# Patient Record
Sex: Female | Born: 1950
Health system: Southern US, Community
[De-identification: ages and names within clinical notes are randomized; demographics above are authoritative.]

## PROBLEM LIST (undated history)

## (undated) DIAGNOSIS — I776 Arteritis, unspecified: Secondary | ICD-10-CM

## (undated) DIAGNOSIS — L659 Nonscarring hair loss, unspecified: Secondary | ICD-10-CM

## (undated) DIAGNOSIS — F32A Depression, unspecified: Secondary | ICD-10-CM

## (undated) DIAGNOSIS — R0600 Dyspnea, unspecified: Secondary | ICD-10-CM

## (undated) DIAGNOSIS — E049 Nontoxic goiter, unspecified: Secondary | ICD-10-CM

## (undated) DIAGNOSIS — F419 Anxiety disorder, unspecified: Secondary | ICD-10-CM

## (undated) DIAGNOSIS — R011 Cardiac murmur, unspecified: Secondary | ICD-10-CM

## (undated) DIAGNOSIS — M797 Fibromyalgia: Secondary | ICD-10-CM

## (undated) DIAGNOSIS — Z780 Asymptomatic menopausal state: Secondary | ICD-10-CM

## (undated) DIAGNOSIS — G4730 Sleep apnea, unspecified: Secondary | ICD-10-CM

## (undated) DIAGNOSIS — M47817 Spondylosis without myelopathy or radiculopathy, lumbosacral region: Secondary | ICD-10-CM

## (undated) DIAGNOSIS — I471 Supraventricular tachycardia, unspecified: Secondary | ICD-10-CM

## (undated) DIAGNOSIS — F329 Major depressive disorder, single episode, unspecified: Secondary | ICD-10-CM

## (undated) DIAGNOSIS — R7611 Nonspecific reaction to tuberculin skin test without active tuberculosis: Secondary | ICD-10-CM

## (undated) DIAGNOSIS — K219 Gastro-esophageal reflux disease without esophagitis: Secondary | ICD-10-CM

## (undated) DIAGNOSIS — M1990 Unspecified osteoarthritis, unspecified site: Secondary | ICD-10-CM

## (undated) DIAGNOSIS — I10 Essential (primary) hypertension: Secondary | ICD-10-CM

## (undated) DIAGNOSIS — I351 Nonrheumatic aortic (valve) insufficiency: Secondary | ICD-10-CM

## (undated) DIAGNOSIS — I341 Nonrheumatic mitral (valve) prolapse: Secondary | ICD-10-CM

## (undated) DIAGNOSIS — I340 Nonrheumatic mitral (valve) insufficiency: Secondary | ICD-10-CM

## (undated) DIAGNOSIS — E785 Hyperlipidemia, unspecified: Secondary | ICD-10-CM

## (undated) HISTORY — DX: Nonscarring hair loss, unspecified: L65.9

## (undated) HISTORY — DX: Supraventricular tachycardia, unspecified: I47.10

## (undated) HISTORY — PX: EYE SURGERY: SHX253

## (undated) HISTORY — PX: CHOLECYSTECTOMY: SHX55

## (undated) HISTORY — PX: DIAGNOSTIC LAPAROSCOPY: SUR761

## (undated) HISTORY — PX: APPENDECTOMY: SHX54

## (undated) HISTORY — DX: Gastro-esophageal reflux disease without esophagitis: K21.9

## (undated) HISTORY — DX: Nontoxic goiter, unspecified: E04.9

## (undated) HISTORY — DX: Nonspecific reaction to tuberculin skin test without active tuberculosis: R76.11

## (undated) HISTORY — DX: Depression, unspecified: F32.A

## (undated) HISTORY — DX: Supraventricular tachycardia: I47.1

## (undated) HISTORY — PX: PARTIAL HYSTERECTOMY: SHX80

## (undated) HISTORY — PX: CARDIAC CATHETERIZATION: SHX172

## (undated) HISTORY — DX: Essential (primary) hypertension: I10

## (undated) HISTORY — DX: Arteritis, unspecified: I77.6

## (undated) HISTORY — DX: Asymptomatic menopausal state: Z78.0

## (undated) HISTORY — DX: Major depressive disorder, single episode, unspecified: F32.9

## (undated) HISTORY — PX: ABDOMINAL HYSTERECTOMY: SHX81

## (undated) HISTORY — DX: Hyperlipidemia, unspecified: E78.5

## (undated) HISTORY — DX: Fibromyalgia: M79.7

## (undated) HISTORY — DX: Anxiety disorder, unspecified: F41.9

---

## 1999-11-27 ENCOUNTER — Ambulatory Visit (HOSPITAL_COMMUNITY): Admission: RE | Admit: 1999-11-27 | Discharge: 1999-11-27 | Payer: Self-pay | Admitting: Emergency Medicine

## 2000-06-29 LAB — HM MAMMOGRAPHY

## 2001-12-04 ENCOUNTER — Inpatient Hospital Stay (HOSPITAL_COMMUNITY): Admission: EM | Admit: 2001-12-04 | Discharge: 2001-12-06 | Payer: Self-pay | Admitting: Emergency Medicine

## 2001-12-05 ENCOUNTER — Encounter: Payer: Self-pay | Admitting: Family Medicine

## 2001-12-06 ENCOUNTER — Encounter: Payer: Self-pay | Admitting: Family Medicine

## 2001-12-14 ENCOUNTER — Encounter: Admission: RE | Admit: 2001-12-14 | Discharge: 2001-12-14 | Payer: Self-pay | Admitting: Family Medicine

## 2001-12-18 ENCOUNTER — Encounter: Admission: RE | Admit: 2001-12-18 | Discharge: 2002-03-18 | Payer: Self-pay | Admitting: Family Medicine

## 2002-04-30 ENCOUNTER — Other Ambulatory Visit: Admission: RE | Admit: 2002-04-30 | Discharge: 2002-04-30 | Payer: Self-pay | Admitting: Diagnostic Radiology

## 2003-01-01 ENCOUNTER — Encounter: Admission: RE | Admit: 2003-01-01 | Discharge: 2003-04-01 | Payer: Self-pay | Admitting: Obstetrics and Gynecology

## 2004-07-27 ENCOUNTER — Ambulatory Visit: Payer: Self-pay | Admitting: Endocrinology

## 2004-09-01 ENCOUNTER — Ambulatory Visit: Payer: Self-pay | Admitting: Endocrinology

## 2004-10-01 ENCOUNTER — Ambulatory Visit: Payer: Self-pay | Admitting: Endocrinology

## 2004-10-29 ENCOUNTER — Emergency Department (HOSPITAL_COMMUNITY): Admission: EM | Admit: 2004-10-29 | Discharge: 2004-10-30 | Payer: Self-pay | Admitting: Emergency Medicine

## 2004-11-12 ENCOUNTER — Ambulatory Visit: Payer: Self-pay | Admitting: Endocrinology

## 2005-03-11 ENCOUNTER — Ambulatory Visit: Payer: Self-pay | Admitting: Endocrinology

## 2005-03-25 ENCOUNTER — Ambulatory Visit: Payer: Self-pay | Admitting: Endocrinology

## 2005-07-01 ENCOUNTER — Ambulatory Visit: Payer: Self-pay | Admitting: Endocrinology

## 2005-07-12 ENCOUNTER — Ambulatory Visit: Payer: Self-pay | Admitting: Endocrinology

## 2005-07-20 ENCOUNTER — Inpatient Hospital Stay (HOSPITAL_COMMUNITY): Admission: RE | Admit: 2005-07-20 | Discharge: 2005-07-23 | Payer: Self-pay | Admitting: Obstetrics and Gynecology

## 2005-07-20 ENCOUNTER — Encounter (INDEPENDENT_AMBULATORY_CARE_PROVIDER_SITE_OTHER): Payer: Self-pay | Admitting: *Deleted

## 2005-07-23 ENCOUNTER — Ambulatory Visit: Payer: Self-pay | Admitting: Internal Medicine

## 2005-08-03 ENCOUNTER — Ambulatory Visit: Payer: Self-pay | Admitting: Endocrinology

## 2005-12-13 ENCOUNTER — Emergency Department (HOSPITAL_COMMUNITY): Admission: EM | Admit: 2005-12-13 | Discharge: 2005-12-13 | Payer: Self-pay | Admitting: Emergency Medicine

## 2005-12-29 ENCOUNTER — Ambulatory Visit (HOSPITAL_COMMUNITY): Admission: RE | Admit: 2005-12-29 | Discharge: 2005-12-29 | Payer: Self-pay | Admitting: Gastroenterology

## 2006-01-05 ENCOUNTER — Ambulatory Visit: Payer: Self-pay | Admitting: Endocrinology

## 2006-02-04 ENCOUNTER — Ambulatory Visit: Payer: Self-pay | Admitting: Endocrinology

## 2006-07-01 ENCOUNTER — Ambulatory Visit: Payer: Self-pay | Admitting: Endocrinology

## 2006-07-15 ENCOUNTER — Ambulatory Visit: Payer: Self-pay | Admitting: Endocrinology

## 2006-08-05 ENCOUNTER — Ambulatory Visit: Payer: Self-pay | Admitting: Endocrinology

## 2006-10-21 ENCOUNTER — Ambulatory Visit: Payer: Self-pay | Admitting: Endocrinology

## 2006-10-21 LAB — CONVERTED CEMR LAB: Vitamin B-12: 626 pg/mL (ref 211–911)

## 2007-01-24 ENCOUNTER — Ambulatory Visit: Payer: Self-pay | Admitting: Endocrinology

## 2007-01-24 LAB — CONVERTED CEMR LAB
Microalb Creat Ratio: 24.4 mg/g (ref 0.0–30.0)
Microalb, Ur: 5 mg/dL — ABNORMAL HIGH (ref 0.0–1.9)

## 2007-04-06 ENCOUNTER — Encounter: Payer: Self-pay | Admitting: Endocrinology

## 2007-04-06 DIAGNOSIS — F329 Major depressive disorder, single episode, unspecified: Secondary | ICD-10-CM

## 2007-04-06 DIAGNOSIS — E109 Type 1 diabetes mellitus without complications: Secondary | ICD-10-CM | POA: Insufficient documentation

## 2007-04-06 DIAGNOSIS — E039 Hypothyroidism, unspecified: Secondary | ICD-10-CM

## 2007-04-06 DIAGNOSIS — F411 Generalized anxiety disorder: Secondary | ICD-10-CM | POA: Insufficient documentation

## 2007-05-02 ENCOUNTER — Ambulatory Visit: Payer: Self-pay | Admitting: Endocrinology

## 2007-05-30 ENCOUNTER — Ambulatory Visit: Payer: Self-pay | Admitting: Internal Medicine

## 2007-06-14 LAB — HM COLONOSCOPY

## 2007-07-12 ENCOUNTER — Ambulatory Visit: Payer: Self-pay | Admitting: Internal Medicine

## 2007-07-12 DIAGNOSIS — K5730 Diverticulosis of large intestine without perforation or abscess without bleeding: Secondary | ICD-10-CM | POA: Insufficient documentation

## 2007-08-01 ENCOUNTER — Ambulatory Visit: Payer: Self-pay | Admitting: Endocrinology

## 2007-08-01 ENCOUNTER — Encounter (INDEPENDENT_AMBULATORY_CARE_PROVIDER_SITE_OTHER): Payer: Self-pay | Admitting: *Deleted

## 2007-08-01 LAB — CONVERTED CEMR LAB: Hgb A1c MFr Bld: 6.8 % — ABNORMAL HIGH (ref 4.6–6.0)

## 2007-08-09 ENCOUNTER — Ambulatory Visit: Payer: Self-pay

## 2007-08-09 ENCOUNTER — Encounter: Payer: Self-pay | Admitting: Endocrinology

## 2007-09-28 ENCOUNTER — Emergency Department (HOSPITAL_COMMUNITY): Admission: EM | Admit: 2007-09-28 | Discharge: 2007-09-28 | Payer: Self-pay | Admitting: Emergency Medicine

## 2007-10-17 ENCOUNTER — Encounter: Payer: Self-pay | Admitting: Endocrinology

## 2007-12-04 DIAGNOSIS — Z8679 Personal history of other diseases of the circulatory system: Secondary | ICD-10-CM

## 2007-12-04 DIAGNOSIS — E1149 Type 2 diabetes mellitus with other diabetic neurological complication: Secondary | ICD-10-CM

## 2007-12-04 DIAGNOSIS — J45909 Unspecified asthma, uncomplicated: Secondary | ICD-10-CM | POA: Insufficient documentation

## 2007-12-04 DIAGNOSIS — E785 Hyperlipidemia, unspecified: Secondary | ICD-10-CM | POA: Insufficient documentation

## 2007-12-13 ENCOUNTER — Ambulatory Visit: Payer: Self-pay | Admitting: Endocrinology

## 2007-12-13 LAB — CONVERTED CEMR LAB: Hgb A1c MFr Bld: 6.3 % — ABNORMAL HIGH (ref 4.6–6.0)

## 2007-12-25 ENCOUNTER — Encounter: Payer: Self-pay | Admitting: Internal Medicine

## 2008-04-16 ENCOUNTER — Encounter: Payer: Self-pay | Admitting: Endocrinology

## 2008-04-19 ENCOUNTER — Ambulatory Visit: Payer: Self-pay | Admitting: Endocrinology

## 2008-05-31 ENCOUNTER — Inpatient Hospital Stay (HOSPITAL_COMMUNITY): Admission: EM | Admit: 2008-05-31 | Discharge: 2008-06-05 | Payer: Self-pay | Admitting: Emergency Medicine

## 2008-05-31 ENCOUNTER — Ambulatory Visit: Payer: Self-pay | Admitting: Internal Medicine

## 2008-07-08 ENCOUNTER — Telehealth (INDEPENDENT_AMBULATORY_CARE_PROVIDER_SITE_OTHER): Payer: Self-pay | Admitting: *Deleted

## 2008-07-25 ENCOUNTER — Encounter: Payer: Self-pay | Admitting: Endocrinology

## 2008-07-30 ENCOUNTER — Ambulatory Visit: Payer: Self-pay | Admitting: Endocrinology

## 2008-10-07 ENCOUNTER — Ambulatory Visit: Payer: Self-pay | Admitting: Endocrinology

## 2008-12-31 ENCOUNTER — Encounter: Payer: Self-pay | Admitting: Endocrinology

## 2009-01-09 ENCOUNTER — Ambulatory Visit: Payer: Self-pay | Admitting: Endocrinology

## 2009-04-15 ENCOUNTER — Encounter: Payer: Self-pay | Admitting: Endocrinology

## 2009-04-16 ENCOUNTER — Ambulatory Visit: Payer: Self-pay | Admitting: Endocrinology

## 2009-06-17 ENCOUNTER — Telehealth: Payer: Self-pay | Admitting: Endocrinology

## 2009-07-14 ENCOUNTER — Ambulatory Visit: Payer: Self-pay | Admitting: Endocrinology

## 2009-07-14 DIAGNOSIS — R809 Proteinuria, unspecified: Secondary | ICD-10-CM | POA: Insufficient documentation

## 2009-07-14 DIAGNOSIS — IMO0001 Reserved for inherently not codable concepts without codable children: Secondary | ICD-10-CM

## 2009-10-13 ENCOUNTER — Inpatient Hospital Stay (HOSPITAL_COMMUNITY): Admission: EM | Admit: 2009-10-13 | Discharge: 2009-10-16 | Payer: Self-pay | Admitting: Emergency Medicine

## 2009-10-13 ENCOUNTER — Ambulatory Visit: Payer: Self-pay | Admitting: Family Medicine

## 2009-11-25 ENCOUNTER — Telehealth (INDEPENDENT_AMBULATORY_CARE_PROVIDER_SITE_OTHER): Payer: Self-pay | Admitting: *Deleted

## 2010-05-04 ENCOUNTER — Encounter: Payer: Self-pay | Admitting: Endocrinology

## 2010-10-13 NOTE — Medication Information (Signed)
Summary: [employer]   Imported By: Lester Edison 05/07/2010 09:41:26  _____________________________________________________________________  External Attachment:    Type:   Image     Comment:   External Document

## 2010-10-13 NOTE — Progress Notes (Signed)
Summary: Records request from [employer]  Request for records received from [employer]. Request forwarded to [facility]. Dena Chavis  November 25, 2009 11:44 AM

## 2010-11-30 LAB — CBC
MCHC: 32.8 g/dL (ref 30.0–36.0)
RBC: 5.16 MIL/uL — ABNORMAL HIGH (ref 3.87–5.11)
WBC: 13.4 10*3/uL — ABNORMAL HIGH (ref 4.0–10.5)

## 2010-11-30 LAB — BASIC METABOLIC PANEL
CO2: 28 mEq/L (ref 19–32)
Creatinine, Ser: 0.76 mg/dL (ref 0.4–1.2)
GFR calc Af Amer: >60 mL/min (ref >60)

## 2010-11-30 LAB — DIFFERENTIAL
Eosinophils Relative: 0 % (ref 0–5)
Monocytes Absolute: 0.5 10*3/uL (ref 0.1–1.0)
Monocytes Relative: 4 % (ref 3–12)
Neutro Abs: 10.9 10*3/uL — ABNORMAL HIGH (ref 1.7–7.7)

## 2010-11-30 LAB — GLUCOSE, CAPILLARY
Glucose-Capillary: 125 mg/dL — ABNORMAL HIGH (ref 70–99)
Glucose-Capillary: 165 mg/dL — ABNORMAL HIGH (ref 70–99)
Glucose-Capillary: 166 mg/dL — ABNORMAL HIGH (ref 70–99)
Glucose-Capillary: 68 mg/dL — ABNORMAL LOW (ref 70–99)

## 2010-12-04 LAB — COMPREHENSIVE METABOLIC PANEL
ALT: 13 U/L (ref 0–35)
ALT: 15 U/L (ref 0–35)
AST: 19 U/L (ref 0–37)
Albumin: 2.9 g/dL — ABNORMAL LOW (ref 3.5–5.2)
Alkaline Phosphatase: 48 U/L (ref 39–117)
BUN: 2 mg/dL — ABNORMAL LOW (ref 6–23)
CO2: 25 mEq/L (ref 19–32)
CO2: 27 mEq/L (ref 19–32)
Calcium: 7.5 mg/dL — ABNORMAL LOW (ref 8.4–10.5)
Calcium: 7.6 mg/dL — ABNORMAL LOW (ref 8.4–10.5)
Creatinine, Ser: 0.68 mg/dL (ref 0.4–1.2)
GFR calc Af Amer: >60 mL/min (ref >60)
GFR calc non Af Amer: >60 mL/min (ref >60)
GFR calc non Af Amer: >60 mL/min (ref >60)
Glucose, Bld: 150 mg/dL — ABNORMAL HIGH (ref 70–99)
Sodium: 139 mEq/L (ref 135–145)
Sodium: 140 mEq/L (ref 135–145)
Total Protein: 5.6 g/dL — ABNORMAL LOW (ref 6.0–8.3)

## 2010-12-04 LAB — GLUCOSE, CAPILLARY
Glucose-Capillary: 121 mg/dL — ABNORMAL HIGH (ref 70–99)
Glucose-Capillary: 130 mg/dL — ABNORMAL HIGH (ref 70–99)
Glucose-Capillary: 131 mg/dL — ABNORMAL HIGH (ref 70–99)
Glucose-Capillary: 131 mg/dL — ABNORMAL HIGH (ref 70–99)
Glucose-Capillary: 143 mg/dL — ABNORMAL HIGH (ref 70–99)
Glucose-Capillary: 148 mg/dL — ABNORMAL HIGH (ref 70–99)
Glucose-Capillary: 155 mg/dL — ABNORMAL HIGH (ref 70–99)
Glucose-Capillary: 159 mg/dL — ABNORMAL HIGH (ref 70–99)
Glucose-Capillary: 165 mg/dL — ABNORMAL HIGH (ref 70–99)

## 2010-12-04 LAB — CBC
MCHC: 33.5 g/dL (ref 30.0–36.0)
MCV: 87.1 fL (ref 78.0–100.0)
Platelets: 212 10*3/uL (ref 150–400)
RBC: 4.18 MIL/uL (ref 3.87–5.11)
RDW: 13.6 % (ref 11.5–15.5)

## 2011-01-26 NOTE — Discharge Summary (Signed)
[name], [name] NO.:  [id number]   MEDICAL RECORD NO.:  [id number]          PATIENT TYPE:  INP   LOCATION:  1302                         FACILITY:  [facility]   PHYSICIAN:  Velora Heckler, MD      DATE OF BIRTH:  December 30, 1950   DATE OF ADMISSION:  05/30/2008  DATE OF DISCHARGE:  06/05/2008                               DISCHARGE SUMMARY   REASON FOR ADMISSION:  Abdominal pain, nausea, vomiting, leukocytosis.   HISTORY OF PRESENT ILLNESS:  Patient is a 60 year old white female  referred by Dr. Earl Lites from [facility] to general surgery  for small bowel obstruction.  The patient had abdominal pain with  distention.  She was seen by Dr. Cleta Alberts and referred to [facility]  Emergency Department.  CT scan of the abdomen and pelvis was performed.  This showed dilated small bowel loops.  There was an area of transition  in the distal ileum.  The patient also was noted to have an elevated  white blood cell count of 19,000.   HOSPITAL COURSE:  Patient was admitted on the general surgical service  for distal small bowel obstruction.  The patient was also seen in  consultation by Dr. Rene Paci from [facility].  Patient  was made n.p.o. and hydrated.  The patient showed gradual but steady  improvement.  White blood cell count normalized.  The patient had been  placed on Cipro for urinary tract infection.  Abdominal x-rays were  obtained at intervals and continued to slowly improve.  The patient made  gradual progress.  Clinical signs of small bowel obstruction resolved.  Radiographic picture improved until the day of discharge when her  abdominal x-ray was essentially normal on review by Dr. Irish Lack  and radiology.   DISCHARGE PLAN:  Patient will be discharged home today, September 23, in  good condition, tolerating her regular diabetic diet, ambulating  independently.  Patient will be seen back in my office at [facility] as  needed.  She will follow up with her primary  physician, Dr. Earl Lites, in the near future.   FINAL DIAGNOSES:  1. Partial small bowel obstruction, resolved.  2. Insulin-dependent diabetes.  3. Urinary tract infection.   CONDITION ON DISCHARGE:  Good.      Velora Heckler, MD  Electronically Signed     TMG/MEDQ  D:  06/05/2008  T:  06/05/2008  Job:  [id number]   cc:   Vikki Ports A. Felicity Coyer, MD  [address] [place], [place] [address]   Stan Head. Cleta Alberts, M.D.  Fax: [phone]

## 2011-01-26 NOTE — H&P (Signed)
NAMEMarland Kitchen  SHAMELA, HAYDON NO.:  [id number]   MEDICAL RECORD NO.:  [id number]          PATIENT TYPE:  EMS   LOCATION:  ED                           FACILITY:  [facility]   PHYSICIAN:  Velora Heckler, MD      DATE OF BIRTH:  1951/04/28   DATE OF ADMISSION:  05/30/2008  DATE OF DISCHARGE:                              HISTORY & PHYSICAL   REASON FOR ADMISSION:  Abdominal pain, nausea and vomiting,  leukocytosis.   HISTORY OF PRESENT ILLNESS:  The patient is a 60 year old white female  from [place], [place] who presents to Dr. Cleta Alberts at [facility] with a 3-day history of crampy abdominal pain, nausea,  vomiting and low grade fever.  The patient had a normal bowel movement  today.  However, she notes left upper quadrant abdominal pain and  distention.  The patient was evaluated.  She was noted to have an  elevated white blood cell count.  She had abdominal x-rays at [facility] showed dilated small-bowel loops.  The patient was brought  to [facility] emergency department for CT scan abdomen and pelvis and  surgical assessment.   PAST MEDICAL HISTORY:  Status post laparoscopic cholecystectomy, status  post total abdominal hysterectomy, status post appendectomy, history of  diabetes mellitus, history of hypertension, history of  hypercholesterolemia, history of supraventricular tachycardia, history  of hypothyroidism, history of asthma.   MEDICATIONS:  Crestor, Effexor. Hyzaar. Potassium chloride, Synthroid,  albuterol.  Alprazolam.  Amlodipine, aspirin.  Humalog insulin. Ocuvite.  Zolpidem.   ALLERGIES:  PENICILLIN.   SOCIAL HISTORY:  The patient is married with four children.  She is  accompanied by her husband.  She works at nail bar.  She quit smoking in  the early 1980s.  She does not drink alcohol.   FAMILY HISTORY:  Noncontributory.   REVIEW OF SYSTEMS  15 system review without significant findings except as noted above.   PHYSICAL  EXAMINATION:  VITAL SIGNS:  Temperature 98.5, pulse 101,  respirations 20, blood pressure 136/70.  HEENT:  Shows her to be normocephalic, atraumatic.  Sclerae clear.  Conjunctiva clear.  Pupils 3 mm and reactive.  Dentition good.  Mucous  membranes moist.  Voice normal.  Palpation of the neck shows no  nodularity.  No tenderness.  No mass.  There are no supraclavicular  masses.  LUNGS:  Clear to auscultation bilaterally.  CARDIAC:  Exam shows regular rate and rhythm without murmur.  Peripheral  pulses are full.  EXTREMITIES:  Nontender without edema.  ABDOMEN:  Protuberant.  Well-healed surgical wounds.  No sign of hernia.  Bowel sounds were present.  Tenderness in the left upper quadrant and  the left mid abdomen to palpation.  No rebound tenderness.  No palpable  mass.  No hepatosplenomegaly.  NEUROLOGICAL:  The patient is alert and  oriented without focal deficit.  No sign of tremor.   LABORATORY STUDIES:  White count 19.8, hemoglobin 15.5, platelet count  277,000.  Differential shows 84% segmented neutrophils.  Prothrombin  time normal at 13.7, INR normal at 1.0.  Electrolytes are  normal.  Liver  function tests are normal.  Lipase normal at 18.  Report from outside  abdominal x-ray showing dilated small-bowel loops, no free air.   IMPRESSION:  Abdominal pain with leukocytosis of undetermined etiology.   PLAN:  The patient is currently in the emergency department at [facility].  She is scheduled to undergo CT scan abdomen  and pelvis.  She is being evaluated concurrently by Dr. Dione Booze from  the emergency department staff.  Pending CT scan abdomen and pelvis  results we will review the patient's exam again and make a decision  regarding admission to the medical service versus the need for surgical  intervention and management.      Velora Heckler, MD  Electronically Signed     TMG/MEDQ  D:  05/30/2008  T:  06/02/2008  Job:  [id number]   cc:   Brett Canales A.  Cleta Alberts, M.D.  Fax: [phone]   Sean A. Everardo All, MD  [address]  [place]  [place] [address]   Wilhemina Bonito. Marina Goodell, MD  [address]  [place]  [place] [address]

## 2011-01-26 NOTE — Consult Note (Signed)
NAMEAMELIYA, NICOTRA NO.:  [id number]   MEDICAL RECORD NO.:  [id number]          PATIENT TYPE:  INP   LOCATION:  1302                         FACILITY:  [facility]   PHYSICIAN:  Valerie A. Felicity Coyer, MDDATE OF BIRTH:  April 26, 1951   DATE OF CONSULTATION:  05/31/2008  DATE OF DISCHARGE:                                 CONSULTATION   REFERRING PHYSICIAN:  Velora Heckler, MD.   PRIMARY CARE PHYSICIAN:  Stan Head. Cleta Alberts, M.D.   ENDOCRINOLOGIST:  Cleophas Dunker. Everardo All, MD.   GI PHYSICIAN:  Wilhemina Bonito. Marina Goodell, MD.   We have been asked by Velora Heckler, MD of general surgery to assist in  evaluation and treatment of numerous chronic medical issues, as well as  acute management while NPO specifically, regarding insulin pump.  There  is also question of assumption of primary service for this patient  during hospitalization.   The patient is a 60 year old white female with multiple medical problems  who was admitted early today with a small bowel obstruction.  She came  to the [facility] Emergency Room due to 4 days of left lower quadrant  pain associated with intractable nausea and vomiting, intolerant of all  p.o.  No diarrhea symptoms in the last 4 days or prior.  No previous  history of abdominal pain or nausea, vomiting symptoms.  No history of  previous bowel obstruction.  She has had previous abdominal surgeries  including an ovarian mass resection (pathology benign with bilateral  salpingo-oophorectomy in November of 2006, as well as a cholecystectomy.  She reports her last bowel movement was yesterday, September 17 a.m.  which was normal.  She has been evaluated previously for Crohn in the  past by GI due to family history of same without evidence for the  patient having this disease.  She has never had DK ever and reports her  CBGs have been well controlled on an insulin pump for the last several  years since being followed by Dr. Everardo All.  She has had a low grade  fever in  the last several days, but denies any dysuria, hematuria, no  hematemesis.  No recent travel.   PAST MEDICAL HISTORY:  1. Type 1 diabetes on insulin pump.  Parameters are as follows:      a.     2.5 units basal 24 hours daily.      b.     Bolus of 1 unit per 1.8 carbs.      c.     Correction of 1 unit per 15/100 on CBG.  2. Diabetic peripheral neuropathy.  3. History of SVT.  4. History of asthma.  5. Dyslipidemia.  6. Hypothyroidism.  7. Depression, anxiety history, no recent flares.  8. History of diverticulosis.  9. Bilateral salpingo-oophorectomy secondary to benign left ovarian      mass in November of 2006.  10.Status post cholecystectomy.   MEDICATIONS:  Include:  1. Insulin pump, see above for details.  2. Effexor XR 150 mg daily.  3. Accolade 20 mg p.o. b.i.d.  4. Aspirin 325 mg daily.  5. Norvasc 5  mg daily.  6. Hyzaar 10/25 p.o. daily.  7. Potassium 20 mEq daily.  8. Synthroid 125 mcg daily.  9. Crestor 20 mg nightly.  10.Ambien 10 mg nightly.  11.Albuterol MDI p.r.n.  12.Xanax 1 mg tablets one-half to one p.r.n. no regular or recent use.   ALLERGIES:  INCLUDE PENICILLIN.   FAMILY HISTORY:  Mother died at age 78 due to hepatitis complications.  Father died at age 67 due to alcoholic complications following her  mother's death.  She also has a brother who passed away in his late 85s  due to Crohn disease and ultimately cancer.   SOCIAL HISTORY:  She lives at home with her spouse and 29 year old.  She  has no tobacco and no alcohol.   REVIEW OF SYSTEMS:  Positive for low grade fever.  No travel, no chills  or night sweats.  GI:  Positive for left lower quadrant pain times 4  days.  No diarrhea.  Positive nausea, vomiting see above.  RESPIRATORY:  No shortness of breath.  No cough.  CARDIOVASCULAR:  No chest pain.  No  palpitations.  No lower extremity swelling.  PSYCH:  No depression  symptoms.  NEURO:  No headache, no seizure or dizziness.  ENDOCRINE:  No   hypoglycemic episodes or symptoms.  See HPI for other details, otherwise  negative except as listed.   PHYSICAL EXAMINATION:  Temperature 99.3, blood pressure 133/73, pulse of  67, respirations 27, sating 97% room air.  GENERAL:  She is in no acute distress.  Her spouse is present at  bedside.  HEAD:  Normocephalic, atraumatic.  ENT:  Normal hearing.  Oropharynx clear.  Mucous membranes moist.  EYES:  Show PERRL.  EOMI.  No jaundice or scleral icterus.  NECK:  Supple.  No JVD, LAD, thyroid nodules or enlargement.  RESPIRATORY:  She is clear to auscultation bilaterally without wheeze or  crackles.  Good air movement bilaterally with no increased work of  breathing or use of accessory muscles.  CARDIOVASCULAR:  Regular rate and rhythm.  No lower extremity edema  bilaterally.  ABDOMEN:  Protuberant with no appreciable bowel sounds present.  There  is no palpable mass or hepatosplenomegaly.  NEURO:  She moves all extremities spontaneously.  Cranial of II-XII are  symmetrically intact and gait is observed and grossly normal.  PSYCH:  She is awake, alert, oriented x4 with normal mood and affect.   LABORATORY DATA:  White count of 19.8, hemoglobin 15.5, platelets 277.  Coags normal.  Lipase normal.  Complete metabolic panel within normal  limits, noting a glucose of 157.  Urinalysis is positive for 7-10 WBCs  and a urine culture is pending.  CT abdomen and pelvis done early this  morning, she has distal small bowel obstruction with the transition at  the ileum, question likely adhesions versus possible ileitis   ASSESSMENT/PLAN:  1. Distal ileum small bowel obstruction with left lower quadrant pain,      nausea, vomiting times 4 days.  There has been no preceding      symptoms to suggest ileitis.  So we will hold any      gastrointestinal evaluation or input at this time.  Continue      symptom management for pain, nausea, vomiting as ordered by surgery      for the bowel obstruction and  continue with  bowel rest.  Question      role for NG tube or decompression.  We will defer this to surgery.  Follow up KUB daily has been ordered, as well as continuation of IV      fluid, NPO status and recheck CBC in a.m. as I expect her reactive      leukocytosis is due to same.  2. Type 1 diabetes on home insulin pump.  We will continue home      regimen per patient parameters as listed above.  Question need for      decreasing her basal rate while NPO, but at this time the patient      to follow CBGs as prior to admission using her own pump and we will      adjust as needed.  Please see orders that produce parameters.  3. Urinary tract infection.  The patient with a positive UA.  We will      follow-up urine culture.  Start empiric Cipro at this time pending      urine culture, especially with low grade fever and leukocytosis,      follow CBC as above.  4. Hypothyroidism.  We will give IV Synthroid at approximately half      home dose while NPO with plans to resume home dose p.o. Synthroid      when #1 resolved.  5. History of asthma.  No current symptoms.  6. Hypertension.  Hold her medications while NPO.  Blood pressure      reasonably controlled at this time.  7. Dyslipidemia.  Likewise we will hold on statin while NPO.   We appreciate this call and will be happy to follow and advise as needed  for her medical issues, but as her primary MD is not with [facility],  we will not assume primary service at this time, but please call if  questions or problems.  Please see orders for further details.      Valerie A. Felicity Coyer, MD  Electronically Signed     VAL/MEDQ  D:  05/31/2008  T:  06/03/2008  Job:  [id number]

## 2011-01-26 NOTE — Assessment & Plan Note (Signed)
[facility]                         GASTROENTEROLOGY OFFICE NOTE   Ashley, [name]                    MRN:          [id number]  DATE:05/30/2007                            DOB:          Nov 11, 1950    REASON FOR EVALUATION:  Screening colonoscopy.   HISTORY:  This is a 60 year old female with insulin dependent diabetes  mellitus, hypothyroidism, hypertension, and gastroesophageal reflux  disease.  She presents today regarding screening colonoscopy.  The  patient initially underwent screening May 09, 2002.  Examination was  normal.  Followup in 5 years recommended.  Her GI review of systems is  remarkable for chronic heartburn, indigestion, and nausea, for which she  takes Protonix with good results.  She also has a history of pill  impaction, for which she underwent upper endoscopy with Dr. Bosie Clos on  an emergent basis December 29, 2005.  Examination revealed an upper  esophageal stricture would only accommodate pediatric endoscope.  The  stricture was then dilated sequentially to a maximal diameter of 15 mm.  She has had no dysphagia since.  Currently, no lower GI complaints, such  as change in bowel habits or bleeding.   ALLERGIES:  PENICILLIN.   CURRENT MEDICATIONS:  1. Aspirin 325 mg daily.  2. Levothyroxine 125 mcg daily.  3. Effexor XR 150 mg daily.  4. Pantoprazole 40 mg daily.  5. Hyzaar 10/25 mg daily.  6. Zyrtec 10 mg daily.  7. Amlodipine 5 mg daily.  8. Insulin pump.  9. Humalog.  10.Crestor 20 mg daily.  11.Ocuvite.  12.Multivitamin.  13.Calcium with vitamin D and vitamin K.  14.She also uses alprazolam p.r.n.  15.Albuterol p.r.n.  16.Ambien p.r.n.   PAST MEDICAL HISTORY:  As above.   PAST SURGICAL HISTORY:  1. Cholecystectomy.  2. Hysterectomy.  3. Appendectomy.   FAMILY HISTORY:  Brother with Crohn's disease.  Mother with liver  disease.  Father with diabetes, heart disease, and alcoholism.   SOCIAL HISTORY:   The patient is married with 2 natural sons and 2  adopted daughters.  She has an associate's degree.  Does not smoke.  Does not use alcohol.   REVIEW OF SYSTEMS:  Per diagnostic evaluation form.   PHYSICAL EXAM:  Pleasant, well-appearing female in no acute distress.  Blood pressure 120/76, heart rate 68, weight 155 pounds.  She is 5 feet  3 inches in height.  HEENT:  Sclerae anicteric, conjunctivae pink.  Oral mucosa is intact.  No adenopathy.  LUNGS:  Clear.  HEART:  Regular.  ABDOMEN:  Soft without tenderness, mass, or hernia.  Good bowel sounds  heard.   IMPRESSION:  1. Screening colonoscopy.  The patient is an appropriate candidate      without contraindication.  We discussed the results of her last      exam and the recommendation to follow up in 5 years versus the      current recommendation in 10 years.  After a detailed discussion of      the pros and cons of proceeding at this time, she wishes to proceed      with repeat screening at  this time.  I concur.  2. Insulin-requiring diabetes.  The patient will adjust her basal      insulin rate prior to the procedure.  3. Gastroesophageal reflux disease.  4. Proximal esophageal stricture with pill impaction status post prior      esophageal dilation, currently asymptomatic.   RECOMMENDATIONS:  1. Continue Protonix.  2. Colonoscopy with polypectomy if necessary.  The nature of the      procedure as well as the risks, benefits, and alternatives have      been reviewed.  She understood and agreed to proceed.     Wilhemina Bonito. Marina Goodell, MD  Electronically Signed    JNP/MedQ  DD: 05/30/2007  DT: 05/30/2007  Job #: [id number]   cc:   Brett Canales A. Cleta Alberts, M.D.

## 2011-01-29 NOTE — Discharge Summary (Signed)
NAMEDULCY, SIDA NO.:  [id number]   MEDICAL RECORD NO.:  [id number]          PATIENT TYPE:  INP   LOCATION:  1610                         FACILITY:  [facility]   PHYSICIAN:  Daniel L. Gottsegen, M.D.DATE OF BIRTH:  1951-05-15   DATE OF ADMISSION:  07/20/2005  DATE OF DISCHARGE:  07/23/2005                                 DISCHARGE SUMMARY   Patient is a 60 year old female who had presented to Dr. Stan Head. Daub for  her annual physical examination and on exam, there was a large pelvic mass.  Ultrasound confirmed a 10 cm pelvic mass with diffuse low level internal  echoes.  A CA-125 was normal.  Follow-up ultrasound failed to show  resolution of the mass, so patient entered the hospital for exploratory  laparotomy and removal of the mass.  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  Frozen section was benign.  The patient underwent bilateral salpingo-oophorectomy.  Final pathology  report confirmed a benign mucinous cyst adenoma.  Postoperatively, the  patient did well.  By the third postoperative day, she was ready for  discharge.   DISCHARGE MEDICATIONS:  Tylox for pain relief.  She was kept on all of her  admitting medications.   Her diabetes was well controlled during the admission, first with a  Glucomander and then with an insulin pump.   DISCHARGE ACTIVITY:  Regular.   DISCHARGE CONDITION:  Improved.   Final pathology report revealed mucinous cyst adenoma, 11 cm, left ovary.  Otherwise benign findings of both ovaries.   DISCHARGE DIAGNOSIS:  Mucinous cyst adenoma.   OPERATION:  Exploratory laparotomy with bilateral salpingo-oophorectomy.      Daniel L. Eda Paschal, M.D.  Electronically Signed     DLG/MEDQ  D:  08/10/2005  T:  08/10/2005  Job:  [id number]

## 2011-01-29 NOTE — Consult Note (Signed)
[facility]                          ENDOCRINOLOGY CONSULTATION   MEENAKSHI, SAZAMA                    MRN:          [id number]  DATE:10/21/2006                            DOB:          [birth year]    REASON FOR VISIT:  Followup diabetes.   HISTORY OF PRESENT ILLNESS:  A 60 year old woman who states her  glucose's are much improved, except they are often above 200 in the  morning. Her total daily insulin dosage is approximately 140 units. She  states her diet is not good.   Regarding her hypothyroidism, she now takes Synthroid 125 mcg a day.   She also recently had some decreased sensation on her right 4th toe.   PAST MEDICAL HISTORY:  1. Dyslipidemia.  2. Anxiety.  3. Asthma.  4. Menopause.  5. Hypothyroidism.  6. Supraventricular tachycardia.  7. Nonspecific vasculitis.  8. Diabetes nephropathy.  9. Left sided thyroid nodule, for which he has had a benign biopsy.   REVIEW OF SYSTEMS:  Only one episode of hypoglycemia since her last  visit, her glucose was 57 at 10:00 a.m. She has gained a few pounds.   PHYSICAL EXAMINATION:  VITAL SIGNS:  Blood pressure 148/85, heart rate  91, temperature 98.2. Weight 147.  GENERAL:  No distress.  EXTREMITIES:  Feet, normal color and temperature. Sensation is intact to  touch at today's visit. Dorsalis pedis pulses are intact bilaterally and  there is no ulcer.   IMPRESSION:  1. Improved control of diabetes but she needs further adjustments with      her pump.  2. Hypothyroidism.  3. Slight numbness of the right foot.   PLAN:  1. Check B12 and TSH.  2. Increase basal rate to 2.8 units per hour, 24 hours a day.  3. Continue bolus at 2 units per gram of carbohydrate.  4. Continue correction bolus at 1 unit per 15 mg percent, in excess of      100.  5. Check some 3:00 a.m. glucose's to exclude nocturnal hypoglycemia.  6. Return in 3 months.     Sean A. Everardo All, MD  Electronically  Signed    SAE/MedQ  DD: 10/23/2006  DT: 10/23/2006  Job #: [id number]   cc:   Brett Canales A. Cleta Alberts, M.D.

## 2011-01-29 NOTE — Discharge Summary (Signed)
[place]. [facility]  Patient:    [name], [name] Visit Number: [id number] MRN: [id number]          Service Type: MED Location: [phone] Attending Physician:  McDiarmid, Leighton Roach. Dictated by:   Vear Clock, M.D. Admit Date:  12/04/2001 Discharge Date: 12/06/2001   CC:         Dr. ________, [facility]   Discharge Summary  DISCHARGE DIAGNOSES: 1. Asthma exacerbation. 2. Diabetes mellitus, type 2. 3. Hypertension. 4. History of paroxysmal supraventricular tachycardia. 5. Depression/anxiety. 6. Hypothyroidism.  DISCHARGE MEDICATIONS:  1. Tussionex 5 cc p.o. b.i.d. p.r.n. cough.  2. Increase Lantus to subcu q.h.s. while on dexamethasone and then return to     14 units q.h.s.  3. Dexamethasone 9 mg p.o. q.d. x2 days and then per Dr. _______.  4. Avapro 75 mg p.o. q.d.  5. Albuterol MDI with spacer q.4-6h. p.r.n. wheeze.  6. Advair 5/500 b.i.d.  7. Glipizide 5 mg p.o. b.i.d.  8. Lanoxin 250 mg mcg p.o. q.d.  9. Avandia 8 mg p.o. q.d. 10. Accolate 20 mg p.o. b.i.d. 11. Synthroid 150 mcg p.o. q.d. 12. Zoloft 50 mg p.o. q.d. 13. Hydrochlorothiazide 12.5 mg p.o. q.d. 14. Claritin 10 mg p.o. q.d. 15. Alprazolam 0.5 mg p.o. q.6h. p.r.n. anxiety. 16. Aspirin 325 mg p.o. q.d. 17. Buspar 15 mg p.o. b.i.d. 18. Premarin 0.625 mg p.o. q.d.  FOLLOW-UP: The patient is to return to Dr. _______ at [facility] in one to two days for a recheck.  Consideration of steroid dosing should be evaluated at that time.  PROCEDURES AND DIAGNOSTIC STUDIES:  EKG x1 normal sinus rhythm, no SVT.  Chest x-ray on December 05, 2001, with mild atelectasis or infiltrate in the anterior left lower lobe; December 06, 2001, no acute disease.  CONSULTANTS:  None.  ADMISSION HISTORY AND PHYSICAL:  Please see dictated H&P for further details. In short, this is a 60 year old white female with five-day history of cough and wheeze seen at [facility]  on December 01, 2001, given a prednisone Dosepak and started on Lantus. Seen for a recheck with continued dyspnea, peak flow of 180s and progressive worsening with dyspnea at rest and difficulty catching her breath.  The patient admitted for acute asthma exacerbation.  HOSPITAL COURSE:  #1 - ACUTE ASTHMA EXACERBATION:  The patient continued with expiratory wheezing but did improve and to no longer have an oxygen requirement.  She was started on Tussionex for her cough, continued on dexamethasone p.o. while she was here. Spacer was provided to patient prior to discharge and her Advair was increased to 500/50 inhaler.  The patient was discharged with follow-up on the day prior to discharge or Friday before the weekend.  #2 - DIABETES:  Secondary probably to the dexamethasone.  The patient has had very poor glycemic control.  Will increase Lantus to 20 units subcu q.h.s. while on the dexamethasone and Dr. _______, can consider further diabetic therapy.  [facility], for a 5:15 to 7:30 p.m. appointment.  #3 - HYPERTENSION:  The patient was stable during her hospitalization; however, Dr. ________ considered that the ACE inhibitor may be contributing to the cough and exacerbating this. Therefore, we did change the patient to Avapro and took her off of her ACE inhibitor.  #4 - HISTORY OF SUPRAVENTRICULAR TACHYCARDIA: The patient was NSR throughout her hospitalization and rate controlled on digoxin.  Digoxin level was not therapeutic but this did not seem to be clinically relevant.  #5 - DEPRESSION/ANXIETY:  Stable during hospitalization.  #6 - HYPOTHYROIDISM: TSH was checked during hospitalization secondary to concern that excess T4 may exacerbate her SVT.  This was found to be negative.  DISCHARGE LABS:  ABG on December 04, 2001, 7.5/37/99/28/98% on two liters.  CBC with wbc 11.2, hemoglobin 12.0, and platelets 234.   Chemistry with sodium 136, potassium 3.9, chloride 101, CO2 28, BUN 11, creatinine 0.5, and glucose 160. Calcium 8.3. Digoxin 0.5. TSH 0.935. No further labs were obtained.  DISPOSITION:  The patient was discharged to home without further incident. Dictated by:   Vear Clock, M.D. Attending Physician:  McDiarmid, Tawanna Cooler D. DD:  12/06/01 TD:  12/07/01 Job: [id number]

## 2011-01-29 NOTE — Op Note (Signed)
NAMEMarland [name]  DANETRA, GLOCK NO.:  [id number]   MEDICAL RECORD NO.:  [id number]          PATIENT TYPE:  AMB   LOCATION:  ENDO                         FACILITY:  [facility]   PHYSICIAN:  Shirley Friar, MDDATE OF BIRTH:  01/14/1951   DATE OF PROCEDURE:  12/29/2005  DATE OF DISCHARGE:                                 OPERATIVE REPORT   PROCEDURE PERFORMED:  Upper endoscopy.   INDICATIONS FOR PROCEDURE:  Dysphagia, recent pill impaction.   MEDICATIONS GIVEN:  Fentanyl 100 mcg IV, Versed 10 mg IV, Phenergan 12.5 mg.   DESCRIPTION OF PROCEDURE:  Standard upper endoscope was inserted through the  oropharynx and on attempt to intubate the esophagus, an upper esophageal  stricture was noted that prevented intubation with the standard upper  endoscope.  The standard upper endoscope was withdrawn and the pediatric  upper endoscope was inserted and easily passed into the oropharynx and into  the esophagus without any resistance.  The endoscope was advanced further  down through the esophagus which was normal in its remainder with no other  mucosal lesions and no ulcers seen.  Endoscope was advanced down into the  stomach which had normal body, antrum, pylorus.  Retroflexion in stomach  revealed normal angularis, cardia and fundus.  The endoscope was  straightened and advanced down to the duodenal bulb and down to the second  portion of the duodenum which were both normal.  The endoscope was then  withdrawn back into the body and a spring tip guidewire was passed through  the working channel of the endoscope and the endoscope was withdrawn in 1 to  1 fashion.  Savary dilation was then completed, starting at 9 mm and then an  11 mm dilation was completed and no resistance and no heme was noted.  After  the 11 mm dilation, it was decided to use a 12.8 mm dilator which was passed  with minimal resistance and a small amount of heme was noted on withdrawal.  A 14 mm and 15 mm dilator  were then passed over the wire with moderate  resistance and moderate amount of heme noted.  The dilator and wire were  then removed in its entirety.   ASSESSMENT:  Upper esophageal stricture, status post Savary dilation up to  15 mm.   PLAN:  1.  No aspirin products for 14 days.  2.  Start proton pump inhibitor once a day.  3.  Office visit in one month to assess change in symptoms and decide on      whether further dilation or other work-up is necessary.      Shirley Friar, MD  Electronically Signed    VCS/MEDQ  D:  12/29/2005  T:  12/30/2005  Job:  [id number]   cc:   Brett Canales A. Cleta Alberts, M.D.  Fax: [phone]

## 2011-01-29 NOTE — H&P (Signed)
NAMEMarland [name]  [name], ALLERS NO.:  [id number]   MEDICAL RECORD NO.:  [id number]          PATIENT TYPE:  INP   LOCATION:  NA                           FACILITY:  [facility]   PHYSICIAN:  Daniel L. Gottsegen, M.D.DATE OF BIRTH:  30-Sep-1950   DATE OF ADMISSION:  DATE OF DISCHARGE:                                HISTORY & PHYSICAL   CHIEF COMPLAINT:  Pelvic mass.   HISTORY OF PRESENT ILLNESS:  The patient is a 60 year old gravida 2 para 2  AB 0 who in early September went to see Dr. Lesle Chris for her annual  physical examination. On examination there was a pelvic mass, he sent her  for an ultrasound. On ultrasound it was 10 x 5 cm. It had slight diffuse  low-level internal echoes. The walls were described as somewhat thickened.  There was no irregularity, there was no hypervascularity. CA-125 was ordered  and was normal. She came to see me at his request and we discussed the  possibility that this could be malignant versus nonmalignant. We elected to  watch her for 4 weeks with hopes that this was a hemorrhagic cyst and it  would resolve. The patient previously had had a hysterectomy in 1973, so  this was obviously an ovarian mass. She returned on October 17, she was re-  ultrasounded. The mass, unfortunately, is still present. It has a normal  resistance index, but because of the large size of it, it is felt that it  should be removed surgically. She now enters the hospital and will under  bilateral salpingo-oophorectomy, and if malignant, any appropriate surgery.  Dr. Ronita Hipps is on standby in case this is malignant.   PAST MEDICAL HISTORY:  The patient has diabetes and is on insulin. She is  taken care of by Dr. Romero Belling. She also has supraventricular  tachycardia. She also has hypothyroidism and she also has a history of  vasculitis.   MEDICATIONS:  1.  Lanoxin 250 mcg one tablet every evening.  2.  Accolate 20 mg tablets one b.i.d.  3.  Synthroid 150 mcg one  every morning.  4.  Effexor continued release 150 mg one every morning.  5.  Hydrochlorothiazide 25 mg tablets one-half tablet every morning.  6.  Albuterol inhaler two puffs as needed.  7.  Aspirin 325 mg daily.  8.  BuSpar 15 mg tablets b.i.d.  9.  Premarin 0.625 mg one daily.  10. Zyrtec 10 mg.  11. Clonazepam 1 mg one-half to a full tablet b.i.d.  12. Zocor 80 mg daily.  13. Avapro 150 mg daily.  14. Klor-Con M10 one tablet daily.  15. Insulin pump.   ALLERGIES:  PENICILLIN.   FAMILY HISTORY:  Brother and father are diabetic. Father is hypertensive.  Father also has coronary artery disease. Maternal aunt has breast cancer,  and she had a brother with spinal cancer.   SOCIAL HISTORY:  She is a nonsmoker, nondrinker. She does use caffeine.   REVIEW OF SYSTEMS:  HEENT:  Negative. CARDIOVASCULAR:  See above. She is  hypertensive with a heart murmur, palpitations, shortness of breath.  RESPIRATORY:  She has asthma. GI:  Negative. MUSCULOSKELETAL:  Negative. GU:  Negative. NEUROLOGICAL/PSYCHIATRIC:  Reveals a history of anxiety and  depression; see medicines above. ALLERGIC, IMMUNOLOGIC, LYMPHATIC, AND  ENDOCRINE:  Reveals an allergy to PENICILLIN, diabetes, vasculitis.   PHYSICAL EXAMINATION:  GENERAL:  The patient is a well-developed and well-  nourished female in no acute distress.  VITAL SIGNS:  Blood pressure is 160/90, pulse is 80 and regular,  respirations 16 and nonlabored, she is afebrile.  HEENT:  All within normal limits.  NECK:  Supple, trachea is in the midline. Thyroid is not enlarged.  LUNGS:  Clear to P&A.  HEART:  Regular rhythm and rate. No obvious murmurs heard.  ABDOMEN:  Soft without guarding, rebound, or masses. There is no evidence of  ascitic fluid.  PELVIC:  External and vaginal is normal. Pap smear shows no atypia. Bimanual  examination reveals a large pelvic mass which feels to be about 10 cm. It  feels to be mobile. Rectovaginal is confirmatory.   EXTREMITIES:  Within normal limits.   ADMISSION IMPRESSION:  1.  Pelvic mass.  2.  Diabetes.  3.  Vasculitis.  4.  Hypothyroidism.  5.  Supraventricular tachycardia.      Daniel L. Eda Paschal, M.D.  Electronically Signed     DLG/MEDQ  D:  07/19/2005  T:  07/19/2005  Job:  [id number]

## 2011-01-29 NOTE — Consult Note (Signed)
[facility]                            ENDOCRINOLOGY CONSULTATION   TEMPRENCE, RHINES                    MRN:          [id number]  DATE:07/01/2006                            DOB:          [birth year]    REASON FOR VISIT:  Followup diabetes.   HISTORY OF PRESENT ILLNESS:  A 60 year old woman who states her glucoses are  persistently running in the 200s and 300s. Her daily insulin total dose is  45-75 units, via her pump. She states she is having a number of symptoms  recently including diarrhea, fatigue and arthralgias that Dr. Cleta Alberts is  working on.   PAST MEDICAL HISTORY:  Dyslipidemia, anxiety, asthma, menopause, depression,  hypothyroidism, supraventricular tachycardia, nonspecific vasculitis,  diabetes, nephropathy and left thyroid nodule. A biopsy which has been  benign.   REVIEW OF SYSTEMS:  Denies any change in her weight. Denies hypoglycemia.   PHYSICAL EXAMINATION:  VITAL SIGNS:  Blood pressure 161/77, heart rate 99,  temperature is 98.9, the weight is 147.  GENERAL:  No distress.  FEET:  Normal color and temperature. There is no ulcer present on the feet.  Dorsalis pedis pulses are intact bilaterally and sensation is intact to  touching the feet.   LABORATORY DATA:  I reviewed 30 home glucose readings with the patient.   IMPRESSION:  She needs an increase in therapy for her diabetes. I do not  think an A1c would be worthwhile.   PLAN:  1. Increase (Humalog via your pump), bolus up to 1.5 units per gram of      carbohydrate, basal rate up to 1.8 units per hour, 24 hours, and      continue your correction bolus at 1 unit for each 20 by which your      glucose exceeds 100.  2. Return in a few weeks.           ______________________________  Cleophas Dunker Everardo All, MD    SAE/MedQ  DD:  07/02/2006  DT:  07/04/2006  Job #:  [id number]   cc:   Brett Canales A. Cleta Alberts, M.D.

## 2011-01-29 NOTE — Op Note (Signed)
NAMEMARRA, FRAGA NO.:  [id number]   MEDICAL RECORD NO.:  [id number]          PATIENT TYPE:  INP   LOCATION:  1610                         [facility]ACILITY:  [facility]   PHYSICIAN:  Daniel L. Gottsegen, M.D.DATE OF BIRTH:  10-24-1950   DATE OF PROCEDURE:  07/20/2005  DATE OF DISCHARGE:                                 OPERATIVE REPORT   PREOPERATIVE DIAGNOSIS:  Pelvic mass.   POSTOPERATIVE DIAGNOSIS:  Benign left ovarian neoplasm.   OPERATION:  Exploratory laparotomy with bilateral salpingo-oophorectomy.   SURGEON:  Dr. Eda Paschal.   FIRST ASSISTANT:  Dr. Audie Box   FINDINGS AT TIME OF SURGERY:  The patient had a 10 cm cyst of her left  ovary, and it had a clear surface without any excrescences.  It was somewhat  adherent to the peritoneum posteriorly, but it was not adherent to bowel.  The patient's right ovary and tube, however, were densely adherent to the  peritoneum as well as to her sigmoid colon.  The right ovary and tube were  normal.   PROCEDURE:  After adequate general orotracheal anesthesia, the patient was  placed in the supine position, prepped and draped in the usual sterile  manner.  Foley catheter was returned to the patient's bladder.  A midline  vertical incision was made and carried down to the fascia which was opened  vertically in the peritoneum which was likewise entered vertically.  Subcutaneous bleeders were clamped and bovied as encountered.  When the  peritoneal cavity was opened, the above findings were noted.  Peritoneal  washings were obtained.  Initially, dissection was done, freeing up the left  ovarian neoplasm from the broad ligament anteriorly.  Finally the IP  ligament could be identified.  The ureter was identified and excluded, and  the IP ligament was doubly clamped, cut, and suture ligated with #1 chromic  catgut.  Continued dissection was done until finally the entire cyst was  free of all its peritoneal attachments.  There  were several areas of  bleeding that had be clamped and sutured with 3-0 Vicryl but finally could  be removed and sent to pathology for tissue diagnosis.  Attention was next  turned to the right ovary and tube.  Although it was not enlarged, it was  adherent to the sigmoid colon.  It was sharply dissected free.  The ureter  was identified.  The IP ligament was clamped, cut, and doubly suture ligated  with #1 chromic catgut, and all the other attachments were freed up with the  Bovie.  This was also sent to pathology for permanent frozen section, and  the left ovary came back benign.  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  Two sponge, needle, and instrument counts were counts were  correct.  Because the area in the ovarian fossae were raw, Surgicel was left  on both sides, and then the peritoneum and the fascia was closed in a single  layer with a 0 PDS double-armed suture.  One was started at the umbilicus.  One was started suprapubically, and they met in the middle and were tied  together.  The  skin was closed with staples.  Estimated blood loss for the entire procedure  was 50 mL with none replaced.  The patient tolerated the procedure well and  left the operating room in satisfactory condition, draining clear urine from  her Foley catheter.      Daniel L. Eda Paschal, M.D.  Electronically Signed     DLG/MEDQ  D:  07/20/2005  T:  07/20/2005  Job:  [id number]

## 2011-03-31 ENCOUNTER — Telehealth: Payer: Self-pay | Admitting: *Deleted

## 2011-03-31 NOTE — Telephone Encounter (Signed)
Per MD, pt is due for OV. Pt states she will callback after vacation to schedule an appointment.

## 2011-06-14 LAB — BASIC METABOLIC PANEL
Chloride: 106
Chloride: 110
Creatinine, Ser: 0.63
GFR calc Af Amer: >60
GFR calc Af Amer: >60
GFR calc non Af Amer: >60
Potassium: 3.9
Potassium: 4
Sodium: 141

## 2011-06-14 LAB — DIFFERENTIAL
Basophils Relative: 1
Lymphocytes Relative: 10 — ABNORMAL LOW
Lymphs Abs: 2
Monocytes Relative: 5
Neutro Abs: 16.7 — ABNORMAL HIGH
Neutrophils Relative %: 84 — ABNORMAL HIGH

## 2011-06-14 LAB — COMPREHENSIVE METABOLIC PANEL
Alkaline Phosphatase: 77
BUN: 14
Calcium: 9.3
Creatinine, Ser: 0.85
Glucose, Bld: 157 — ABNORMAL HIGH
Total Protein: 7.1

## 2011-06-14 LAB — URINE CULTURE

## 2011-06-14 LAB — CBC
HCT: 36.4
HCT: 46.5 — ABNORMAL HIGH
Hemoglobin: 12
Hemoglobin: 15.5 — ABNORMAL HIGH
MCHC: 33.4
MCV: 84.8
MCV: 85.2
RBC: 4.27
RDW: 13.9
WBC: 8.3

## 2011-06-14 LAB — CLOSTRIDIUM DIFFICILE EIA: C difficile Toxins A+B, EIA: NEGATIVE

## 2011-06-14 LAB — URINE MICROSCOPIC-ADD ON

## 2011-06-14 LAB — URINALYSIS, ROUTINE W REFLEX MICROSCOPIC
Glucose, UA: NEGATIVE
Hgb urine dipstick: NEGATIVE
Protein, ur: NEGATIVE

## 2011-06-14 LAB — PROTIME-INR: Prothrombin Time: 13.7

## 2011-06-14 LAB — GLUCOSE, CAPILLARY

## 2011-10-27 ENCOUNTER — Other Ambulatory Visit: Payer: Self-pay | Admitting: Physician Assistant

## 2011-11-04 ENCOUNTER — Encounter: Payer: Self-pay | Admitting: *Deleted

## 2011-11-04 DIAGNOSIS — K219 Gastro-esophageal reflux disease without esophagitis: Secondary | ICD-10-CM | POA: Insufficient documentation

## 2011-11-04 DIAGNOSIS — I471 Supraventricular tachycardia: Secondary | ICD-10-CM | POA: Insufficient documentation

## 2011-11-04 DIAGNOSIS — F329 Major depressive disorder, single episode, unspecified: Secondary | ICD-10-CM

## 2011-11-04 DIAGNOSIS — E119 Type 2 diabetes mellitus without complications: Secondary | ICD-10-CM | POA: Insufficient documentation

## 2011-11-04 DIAGNOSIS — I776 Arteritis, unspecified: Secondary | ICD-10-CM | POA: Insufficient documentation

## 2011-11-04 DIAGNOSIS — E049 Nontoxic goiter, unspecified: Secondary | ICD-10-CM | POA: Insufficient documentation

## 2011-11-04 DIAGNOSIS — J45909 Unspecified asthma, uncomplicated: Secondary | ICD-10-CM | POA: Insufficient documentation

## 2011-11-04 DIAGNOSIS — F32A Depression, unspecified: Secondary | ICD-10-CM | POA: Insufficient documentation

## 2011-11-16 ENCOUNTER — Ambulatory Visit: Payer: Self-pay | Admitting: Emergency Medicine

## 2011-11-16 ENCOUNTER — Encounter: Payer: Self-pay | Admitting: Emergency Medicine

## 2011-11-16 DIAGNOSIS — G4700 Insomnia, unspecified: Secondary | ICD-10-CM

## 2011-11-16 DIAGNOSIS — E119 Type 2 diabetes mellitus without complications: Secondary | ICD-10-CM

## 2011-11-16 DIAGNOSIS — F439 Reaction to severe stress, unspecified: Secondary | ICD-10-CM

## 2011-11-16 LAB — POCT GLYCOSYLATED HEMOGLOBIN (HGB A1C): Hemoglobin A1C: 9.5

## 2011-11-16 MED ORDER — ALPRAZOLAM 1 MG PO TABS: ORAL_TABLET | ORAL | Status: DC

## 2011-11-16 MED ORDER — INSULIN ASPART 100 UNIT/ML ~~LOC~~ SOLN: SUBCUTANEOUS | Status: DC

## 2011-11-16 MED ORDER — ZOLPIDEM TARTRATE 10 MG PO TABS: 5.0000 mg | ORAL_TABLET | Freq: Every evening | ORAL | Status: DC | PRN

## 2011-11-16 NOTE — Progress Notes (Signed)
  Subjective:    Patient ID: [name], female    DOB: 02-Dec-1950, 61 y.o.   MRN: [id number]  HPI patient in for diabetes check. She has not been watching her diet as closely as she should. She is interested in changing from Humalog to NovoLog for financial reasons. She uses an insulin pump. She has not been good about counting carbs to give her bolus prior to meals.    Review of Systems specifically she denies chest pain shortness of breath problems with her feet.     Objective:   Physical Exam physical exam HEENT exam is unremarkable her neck is supple. Chest is clear to auscultation and percussion. Heart regular rate no murmurs rubs or gallops. Sensation in her feet is normal. She has normal blood flow capillary refill and dorsalis pedis posterior tibial pulses 2+        Assessment & Plan:  Assessment is diabetes stable at the present time. Would change from Humalog to NovoLog for financial reasons.

## 2011-12-06 ENCOUNTER — Other Ambulatory Visit: Payer: Self-pay | Admitting: Emergency Medicine

## 2011-12-16 ENCOUNTER — Other Ambulatory Visit: Payer: Self-pay | Admitting: Emergency Medicine

## 2012-01-15 ENCOUNTER — Other Ambulatory Visit: Payer: Self-pay | Admitting: Internal Medicine

## 2012-02-02 ENCOUNTER — Other Ambulatory Visit: Payer: Self-pay | Admitting: Emergency Medicine

## 2012-02-15 ENCOUNTER — Other Ambulatory Visit: Payer: Self-pay | Admitting: Internal Medicine

## 2012-02-15 ENCOUNTER — Other Ambulatory Visit: Payer: Self-pay | Admitting: Emergency Medicine

## 2012-02-22 ENCOUNTER — Other Ambulatory Visit: Payer: Self-pay | Admitting: Physician Assistant

## 2012-03-07 ENCOUNTER — Ambulatory Visit (INDEPENDENT_AMBULATORY_CARE_PROVIDER_SITE_OTHER): Payer: Self-pay | Admitting: Emergency Medicine

## 2012-03-07 ENCOUNTER — Encounter: Payer: Self-pay | Admitting: Emergency Medicine

## 2012-03-07 VITALS — BP 128/68 | HR 90 | Temp 98.6°F | Resp 16 | Ht 62.75 in | Wt 165.5 lb

## 2012-03-07 DIAGNOSIS — E119 Type 2 diabetes mellitus without complications: Secondary | ICD-10-CM

## 2012-03-07 NOTE — Progress Notes (Signed)
  Subjective:    Patient ID: [name], female    DOB: May 26, 1951, 61 y.o.   MRN: [id number]  HPI patient enters for recheck of diabetes. She has an insulin pump and gives intermittent boluses depending on food intake. She states her sugars way out of control and would prefer not to have it checked here today has been a huge issue she wants to switch to [facility] because she can afford this better he has been on Humalog and NovoLog and says these are quite expensive but feels she can Novolin much cheaper. I called the pharmacist to see if that was compatible with her insulin pump and he said it was    Review of Systems she denies chest pain shortness of breath her other ongoing medical problems.     Objective:   Physical Exam The patient appears somewhat cushingoid. Her neck is supple her chest was clear. Heart was regular rate no murmur abdomen is soft and nontender .       Assessment & Plan:  Diabetes with very poor control. This is primarily due to financial situation and inability to afford her medications. Labs were not done today. She was given a prescription for Novolin R. She is to review this with her pharmacist to be sure is compatible with the palm she is using.

## 2012-03-11 ENCOUNTER — Other Ambulatory Visit: Payer: Self-pay | Admitting: Physician Assistant

## 2012-03-11 ENCOUNTER — Other Ambulatory Visit: Payer: Self-pay | Admitting: Internal Medicine

## 2012-03-21 ENCOUNTER — Telehealth: Payer: Self-pay

## 2012-03-21 MED ORDER — LOSARTAN POTASSIUM-HCTZ 100-25 MG PO TABS: 1.0000 | ORAL_TABLET | Freq: Every day | ORAL | Status: DC

## 2012-03-21 MED ORDER — LEVOTHYROXINE SODIUM 125 MCG PO TABS: 125.0000 ug | ORAL_TABLET | Freq: Every day | ORAL | Status: DC

## 2012-03-21 NOTE — Telephone Encounter (Signed)
Please let patient know I sent 1 month of each to the pharmacy but she will need an office visit for any additional refills.

## 2012-03-21 NOTE — Telephone Encounter (Signed)
PATIENT STATES HER PHARMACY HAS TRIED TO CONTACT us TWICE REGARDING GETTING REFILLS ON HER LEVOTHYROXINE AND LOSARTAN AND THEY HAVE NOT HEARD BACK FROM [place] YET. SHE WOULD LIKE TO GET THEM REFILLED AS SOON AS POSSIBLE PLEASE. HER DOCTOR IS DR. DAUB. BEST PHONE [phone] (CELL)  PHARMACY CHOICE IS [facility] ON [address].    MBC

## 2012-03-21 NOTE — Telephone Encounter (Signed)
Informed pt these rx's were sent to pharmacy.

## 2012-04-06 ENCOUNTER — Other Ambulatory Visit: Payer: Self-pay | Admitting: Physician Assistant

## 2012-05-04 ENCOUNTER — Telehealth: Payer: Self-pay

## 2012-05-04 NOTE — Telephone Encounter (Signed)
RX LOSARTAN 100/25 REFILL [phone]

## 2012-05-05 MED ORDER — LOSARTAN POTASSIUM-HCTZ 100-25 MG PO TABS: 1.0000 | ORAL_TABLET | Freq: Every day | ORAL | Status: DC

## 2012-05-05 NOTE — Telephone Encounter (Signed)
Rx sent to pharmacy with 1 refill then she needs office visit

## 2012-05-05 NOTE — Telephone Encounter (Signed)
Pt has appt scheduled for appt.

## 2012-05-09 ENCOUNTER — Telehealth: Payer: Self-pay

## 2012-05-09 MED ORDER — AMLODIPINE BESYLATE 5 MG PO TABS: 5.0000 mg | ORAL_TABLET | Freq: Every day | ORAL | Status: DC

## 2012-05-09 NOTE — Telephone Encounter (Signed)
Pt's husband came into 56 and stated that his pharmacy said they have tried to get a RF of amlodipine for pt, but I see no requests in EPIC. Told husband that we will send in same amount of RFs as we just sent on pt's losartan and that he can check w/pharmacy in a little while to see if they have it ready. Sent in RFs.

## 2012-05-25 ENCOUNTER — Telehealth: Payer: Self-pay

## 2012-05-25 NOTE — Telephone Encounter (Signed)
Yes due to inability to walk any distance

## 2012-05-25 NOTE — Telephone Encounter (Signed)
Is it okay for patient to have handicapped placard?

## 2012-05-25 NOTE — Telephone Encounter (Signed)
DR. Cleta Alberts - PT'S HUSBAND CAME BY.  YOU WROTE BOTH THE WIFE AND HUSBAND A HANDICAP PLACARD, HOWEVER THE HUSBAND'S NAME WAS PUT ON BOTH THEM.  LAST NIGHT, THE WIFE'S FELL OFF HER MIRROR AND SHE WAS GIVEN A $250 TICKET.  SHE SHOWED THE OFFICER, BUT BECAUSE THE HUSBAND'S NAME WAS ON IT SHE WAS STILL GIVEN A TICKET.  THEY NEED A NEW ONE AND SOMETHING WRITTEN FROM [place] ASAP TO TAKE CARE OF THIS.  [phone] OR [phone]

## 2012-05-26 NOTE — Telephone Encounter (Signed)
Have filled out, will have DR daub sign this for her then will advise when it is ready

## 2012-05-26 NOTE — Telephone Encounter (Signed)
Patient advised it is signed and ready for pick up

## 2012-06-06 ENCOUNTER — Encounter: Payer: Self-pay | Admitting: Emergency Medicine

## 2012-06-06 ENCOUNTER — Ambulatory Visit: Payer: Self-pay | Admitting: Emergency Medicine

## 2012-06-06 VITALS — BP 120/70 | HR 85 | Temp 98.6°F | Resp 16 | Ht 63.0 in | Wt 170.8 lb

## 2012-06-06 DIAGNOSIS — E785 Hyperlipidemia, unspecified: Secondary | ICD-10-CM

## 2012-06-06 DIAGNOSIS — E119 Type 2 diabetes mellitus without complications: Secondary | ICD-10-CM

## 2012-06-06 DIAGNOSIS — E039 Hypothyroidism, unspecified: Secondary | ICD-10-CM

## 2012-06-06 DIAGNOSIS — Z23 Encounter for immunization: Secondary | ICD-10-CM

## 2012-06-06 LAB — COMPREHENSIVE METABOLIC PANEL
Albumin: 4.1 g/dL (ref 3.5–5.2)
Alkaline Phosphatase: 67 U/L (ref 39–117)
CO2: 27 mEq/L (ref 19–32)
Chloride: 100 mEq/L (ref 96–112)
Glucose, Bld: 287 mg/dL — ABNORMAL HIGH (ref 70–99)
Potassium: 3.8 mEq/L (ref 3.5–5.3)
Sodium: 140 mEq/L (ref 135–145)
Total Protein: 6.8 g/dL (ref 6.0–8.3)

## 2012-06-06 LAB — TSH: TSH: 3.352 u[IU]/mL (ref 0.350–4.500)

## 2012-06-06 NOTE — Progress Notes (Signed)
  Subjective:    Patient ID: [name], female    DOB: [birth year], 61 y.o.   MRN: [id number]  HPI patient states she has been stable since her last visit here. She felt like her recent changes in insulin have made no difference in her sugar levels. 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 She has had no hypoglycemic episodes.    Review of Systems     Objective:   Physical Exam HEENT exam is unremarkable her neck is supple her chest is clear cardiac exam is on the markable. Examination of her feet reveal normal filament test symmetrical    Results for orders placed in visit on 06/06/12  GLUCOSE, POCT (MANUAL RESULT ENTRY)      Component Value Range   POC Glucose 310 (*) 70 - 99 mg/dl  POCT GLYCOSYLATED HEMOGLOBIN (HGB A1C)      Component Value Range   Hemoglobin A1C 9.3        Assessment & Plan:  Patient states she's going to increase her basal insulin rate to a total work on getting her hemoglobin A1c at least intubates by her next visit she had gained 5 pounds since her last visit she agrees to work on this

## 2012-06-08 ENCOUNTER — Telehealth: Payer: Self-pay

## 2012-06-08 NOTE — Telephone Encounter (Signed)
Please call the patient. Our office policy is to call your pharmacy for all refill requests. Please have her call her pharmacy and have them contact us for the specific medications that she needs refilled. She is on 18 medications.

## 2012-06-08 NOTE — Telephone Encounter (Signed)
Pt was just here to see Dr. Cleta Alberts and needs RF's of all of her meds. Says she uses [facility] on [address] and that we'll have to call or fax them in because they are not on the computer system ?? Thanks

## 2012-06-09 MED ORDER — LEVOTHYROXINE SODIUM 125 MCG PO TABS: 125.0000 ug | ORAL_TABLET | Freq: Every day | ORAL | Status: DC

## 2012-06-09 NOTE — Addendum Note (Signed)
Addended by: Morrell Riddle on: 06/09/2012 04:46 PM   Modules accepted: Orders

## 2012-06-09 NOTE — Telephone Encounter (Signed)
Spoke with pt advised message from [place]. She did call and she only needs refill on synthroid. Everything else was refilled. Can we send this one in?

## 2012-06-09 NOTE — Telephone Encounter (Signed)
Done

## 2012-06-09 NOTE — Telephone Encounter (Signed)
Called patient to advise  °

## 2012-06-14 ENCOUNTER — Other Ambulatory Visit: Payer: Self-pay | Admitting: Radiology

## 2012-07-21 ENCOUNTER — Other Ambulatory Visit: Payer: Self-pay | Admitting: Physician Assistant

## 2012-07-25 ENCOUNTER — Other Ambulatory Visit: Payer: Self-pay | Admitting: *Deleted

## 2012-07-25 MED ORDER — LOSARTAN POTASSIUM-HCTZ 100-25 MG PO TABS: 1.0000 | ORAL_TABLET | Freq: Every day | ORAL | Status: DC

## 2012-07-25 MED ORDER — AMLODIPINE BESYLATE 5 MG PO TABS: 5.0000 mg | ORAL_TABLET | Freq: Every day | ORAL | Status: DC

## 2012-08-22 ENCOUNTER — Other Ambulatory Visit: Payer: Self-pay | Admitting: Emergency Medicine

## 2012-08-22 DIAGNOSIS — F329 Major depressive disorder, single episode, unspecified: Secondary | ICD-10-CM

## 2012-08-22 MED ORDER — VENLAFAXINE HCL ER 150 MG PO CP24: ORAL_CAPSULE | ORAL | Status: DC

## 2012-08-22 MED ORDER — SERTRALINE HCL 50 MG PO TABS: ORAL_TABLET | ORAL | Status: DC

## 2012-09-07 ENCOUNTER — Other Ambulatory Visit: Payer: Self-pay | Admitting: Emergency Medicine

## 2012-09-07 ENCOUNTER — Other Ambulatory Visit: Payer: Self-pay | Admitting: Physician Assistant

## 2012-09-07 NOTE — Telephone Encounter (Signed)
Forward to Dr. Daub 

## 2012-09-17 ENCOUNTER — Telehealth: Payer: Self-pay

## 2012-09-17 NOTE — Telephone Encounter (Signed)
PHARMACY CALLED STATING THAT THEY RECEIVED A REQUEST FOR EFFEXOR ON 08/22/12 WITH A TAPERED DOSE AND THEY WOULD LIKE TO TALK WITH SOMEONE ABOUT CHANGING THE DOSAGE  BEST [phone]

## 2012-09-18 ENCOUNTER — Other Ambulatory Visit: Payer: Self-pay | Admitting: Radiology

## 2012-09-18 NOTE — Telephone Encounter (Signed)
Week 1 :alternate 1/2 tablet and 1 tablet daily Week 2: 1/2 tablet daily  Week 3 1/2 tablet every other day Week 4 : 1/2 tablet every third day, however this medication is a capsule. I have cancelled the Rx. Please advise on best way to taper this medication, Capsule. [name]

## 2012-09-18 NOTE — Telephone Encounter (Signed)
From the original prescription for Effexor XR 150 mg: Take 1 tablet alternate half tablet daily for one week then take half tablet daily for one week then take half tablet every other day for one week then take half tablet every third day for one week and then stop.  Week 1: Day 1 take 75 mg Day 2 take 150 mg Day 3 take 75 mg Day 4 take 150 mg Day 5 take 75 mg Day 6 take 150 mg Day 7 take 75 mg  Week 2 (days 8-14): take 75 mg  Week 3 (days 15-21): take 75 mg every other day Day 15 take NONE Day 16 take 75 mg Day 17 take NONE Day 18 take 75 mg Day 19 take NONE Day 20 take 75 mg Day 21 take NONE  Week 4 (days 22-28): take 75 mg every 3rd day Day 22 take 75 mg Day 23 take NONE Day 24 take NONE Day 25 take 75 mg Day 26 take NONE Day 27 take NONE Day 28 take 75 mg  Then, discontinue.

## 2012-09-19 ENCOUNTER — Ambulatory Visit: Payer: Self-pay | Admitting: Emergency Medicine

## 2012-09-19 MED ORDER — VENLAFAXINE HCL ER 75 MG PO CP24: ORAL_CAPSULE | ORAL | Status: DC

## 2012-09-19 NOTE — Telephone Encounter (Signed)
I have sent this in

## 2012-09-20 ENCOUNTER — Other Ambulatory Visit: Payer: Self-pay | Admitting: Radiology

## 2012-09-20 ENCOUNTER — Telehealth: Payer: Self-pay

## 2012-09-20 NOTE — Telephone Encounter (Signed)
Pharmacy and the husband has called trying to talk with someone about the medication change from effexor   Patient husband is waiting at the pharmacy   Best number [phone]

## 2012-09-20 NOTE — Telephone Encounter (Signed)
[facility] called because the Effexor xr does not come in tablet. It comes in capsule. Please advise from previous message re: Effexor taper

## 2012-09-21 MED ORDER — VENLAFAXINE HCL 75 MG PO TABS: 75.0000 mg | ORAL_TABLET | ORAL | Status: DC

## 2012-09-21 NOTE — Telephone Encounter (Signed)
Let's change her from the XR to the immediate release tablets.  I sent this to the pharmacy.  She will get the same total daily dose of Effexor, but instead of taking 150mg  XR once daily, she will need to take 75mg  immediate release twice daily.   The 75mg  tabs are scored and can be split.  We can use the taper schedule that Chelle described previously, see below  Week 1:  Day 1 take 75 mg (take 1/2 tab - 37.5mg  - morning and night) Day 2 take 150 mg (take 1 whole tab, morning and night) Day 3 take 75 mg  Day 4 take 150 mg  Day 5 take 75 mg  Day 6 take 150 mg  Day 7 take 75 mg   Week 2 (days 8-14): take 75 mg (take one whole tab every day)  Week 3 (days 15-21): take 75 mg every other day  Day 15 take NONE  Day 16 take 75 mg  Day 17 take NONE  Day 18 take 75 mg  Day 19 take NONE  Day 20 take 75 mg  Day 21 take NONE   Week 4 (days 22-28): take 75 mg every 3rd day   Day 22 take 75 mg  Day 23 take NONE  Day 24 take NONE  Day 25 take 75 mg  Day 26 take NONE  Day 27 take NONE  Day 28 take 75 mg  Then, discontinue.

## 2012-10-02 ENCOUNTER — Encounter: Payer: Self-pay | Admitting: Emergency Medicine

## 2012-10-02 ENCOUNTER — Ambulatory Visit: Payer: Self-pay | Admitting: Emergency Medicine

## 2012-10-02 VITALS — BP 140/68 | HR 79 | Temp 98.2°F | Resp 16 | Ht 63.0 in | Wt 164.8 lb

## 2012-10-02 DIAGNOSIS — E785 Hyperlipidemia, unspecified: Secondary | ICD-10-CM

## 2012-10-02 DIAGNOSIS — F419 Anxiety disorder, unspecified: Secondary | ICD-10-CM

## 2012-10-02 DIAGNOSIS — J45909 Unspecified asthma, uncomplicated: Secondary | ICD-10-CM

## 2012-10-02 DIAGNOSIS — E119 Type 2 diabetes mellitus without complications: Secondary | ICD-10-CM

## 2012-10-02 DIAGNOSIS — R0602 Shortness of breath: Secondary | ICD-10-CM

## 2012-10-02 LAB — COMPREHENSIVE METABOLIC PANEL
Albumin: 4.1 g/dL (ref 3.5–5.2)
Alkaline Phosphatase: 73 U/L (ref 39–117)
BUN: 9 mg/dL (ref 6–23)
Creat: 0.68 mg/dL (ref 0.50–1.10)
Glucose, Bld: 209 mg/dL — ABNORMAL HIGH (ref 70–99)
Total Bilirubin: 0.5 mg/dL (ref 0.3–1.2)

## 2012-10-02 LAB — LIPID PANEL
HDL: 42 mg/dL (ref >39)
LDL Cholesterol: 69 mg/dL (ref 0–99)
Total CHOL/HDL Ratio: 3.2 Ratio
Triglycerides: 112 mg/dL (ref <150)

## 2012-10-02 LAB — GLUCOSE, POCT (MANUAL RESULT ENTRY): POC Glucose: 226 mg/dl — AB (ref 70–99)

## 2012-10-02 MED ORDER — ALPRAZOLAM 1 MG PO TABS: ORAL_TABLET | ORAL | Status: DC

## 2012-10-02 MED ORDER — FLUTICASONE-SALMETEROL 250-50 MCG/DOSE IN AEPB: 1.0000 | INHALATION_SPRAY | Freq: Two times a day (BID) | RESPIRATORY_TRACT | Status: DC

## 2012-10-02 NOTE — Progress Notes (Signed)
  Subjective:    Patient ID: [name], female    DOB: 04/14/1951, 62 y.o.   MRN: [id number]  HPI Patient enters for followup. She has been having increasing difficulty with her asthma especially at night she's been on Advair in the past but is not able to afford that medication at the present time. She has been given her flu shot for this year. She has not been to the eye Dr. this year. She has not had a mammogram for financial reasons. She states she is up-to-date on her colonoscopy.   Review of Systems     Objective:   Physical Exam HEENT exam is unremarkable. Neck is supple. Chest is clear to both auscultation and percussion. I did not hear any wheezes. Cardiac exam reveals a loud gallop. Pulses are low extremities are 2+ and symmetrical  Results for orders placed in visit on 10/02/12  GLUCOSE, POCT (MANUAL RESULT ENTRY)      Component Value Range   POC Glucose 226 (*) 70 - 99 mg/dl  POCT GLYCOSYLATED HEMOGLOBIN (HGB A1C)      Component Value Range   Hemoglobin A1C 8.9          Assessment & Plan:  Patient is at extreme high risk for complications related to her diabetes. She has not been compliant with her insulin. This has all been on the basis of financial problems. Referral to be made to [facility].

## 2012-10-19 ENCOUNTER — Other Ambulatory Visit: Payer: Self-pay | Admitting: Physician Assistant

## 2012-10-20 ENCOUNTER — Other Ambulatory Visit: Payer: Self-pay | Admitting: Physician Assistant

## 2012-11-06 ENCOUNTER — Other Ambulatory Visit: Payer: Self-pay | Admitting: Cardiology

## 2012-11-06 ENCOUNTER — Ambulatory Visit
Admission: RE | Admit: 2012-11-06 | Discharge: 2012-11-06 | Disposition: A | Discharge status: Home / Self Care | Payer: No Typology Code available for payment source | Source: Ambulatory Visit | Attending: Cardiology | Admitting: Cardiology

## 2012-11-21 ENCOUNTER — Encounter: Payer: Self-pay | Admitting: Emergency Medicine

## 2012-11-21 ENCOUNTER — Ambulatory Visit (INDEPENDENT_AMBULATORY_CARE_PROVIDER_SITE_OTHER): Payer: Self-pay | Admitting: Emergency Medicine

## 2012-11-21 VITALS — BP 164/71 | HR 77 | Temp 97.9°F | Resp 16 | Ht 63.5 in | Wt 165.0 lb

## 2012-11-21 DIAGNOSIS — F329 Major depressive disorder, single episode, unspecified: Secondary | ICD-10-CM

## 2012-11-21 NOTE — Progress Notes (Signed)
  Subjective:    Patient ID: [name], female    DOB: May 27, 1951, 62 y.o.   MRN: [id number]  HPI patient in to followup her switch from Effexor to Zoloft. She is currently off her Effexor and on Zoloft 50 mg a day. She feels significantly better since making the change. She has been to see Dr. Jacinto Halim and gone through a thorough cardiovascular workup    Review of Systems     Objective:   Physical Exam chest is clear heart regular rate no murmurs extremities exam is normal there is no tissue breakdown on the feet.        Assessment & Plan:  She is going to increase her basal insulin rate if possible to 2.75 units and are from 2.5 units an hour. If she is unable to do she will increase her 1-1 and then his dose to 1.2 units of insulin per one unit of carb.

## 2012-11-29 ENCOUNTER — Encounter: Payer: Self-pay | Admitting: Emergency Medicine

## 2012-12-25 ENCOUNTER — Other Ambulatory Visit: Payer: Self-pay | Admitting: Physician Assistant

## 2012-12-25 ENCOUNTER — Other Ambulatory Visit: Payer: Self-pay | Admitting: Emergency Medicine

## 2012-12-25 NOTE — Telephone Encounter (Signed)
Called pt who verified that she is NOT taking effexor. She had requested this RF in error and pharmacy was supposed to delete request. She has RF of Zoloft and is doing well on it.

## 2012-12-26 ENCOUNTER — Telehealth: Payer: Self-pay | Admitting: Radiology

## 2012-12-26 MED ORDER — ZOLPIDEM TARTRATE 5 MG PO TABS: 5.0000 mg | ORAL_TABLET | Freq: Every evening | ORAL | Status: DC | PRN

## 2012-12-26 NOTE — Telephone Encounter (Signed)
done

## 2012-12-26 NOTE — Telephone Encounter (Signed)
It is okay to refill her Ambien but she can only take Ambien 5 mg at bedtime instead of 10 mg.

## 2012-12-26 NOTE — Telephone Encounter (Signed)
Thanks

## 2012-12-26 NOTE — Addendum Note (Signed)
Addended byCaffie Damme on: 12/26/2012 12:44 PM   Modules accepted: Orders, Medications

## 2012-12-26 NOTE — Telephone Encounter (Signed)
Yes she needs to be on 5 mg at bedtime

## 2012-12-26 NOTE — Telephone Encounter (Signed)
Have ambien to fax, you have recommended your female patients take 5 mg instead of 10 mg, please advise should this be for the 5mg  dose?

## 2012-12-26 NOTE — Telephone Encounter (Signed)
It is okay to refill her Ambien but she can only take 5 mg at bedtime.

## 2012-12-28 NOTE — Telephone Encounter (Signed)
Notified pt and advised her to only take 1/2 tab (5 mg) Qhs. Pt stated that is what she is doing.

## 2013-01-19 ENCOUNTER — Other Ambulatory Visit: Payer: Self-pay | Admitting: Physician Assistant

## 2013-02-18 ENCOUNTER — Other Ambulatory Visit: Payer: Self-pay | Admitting: Physician Assistant

## 2013-02-27 ENCOUNTER — Encounter: Payer: Self-pay | Admitting: Emergency Medicine

## 2013-02-27 ENCOUNTER — Ambulatory Visit: Payer: Self-pay | Admitting: Emergency Medicine

## 2013-02-27 VITALS — BP 164/64 | HR 103 | Temp 98.8°F | Resp 16 | Ht 63.0 in | Wt 167.4 lb

## 2013-02-27 DIAGNOSIS — E785 Hyperlipidemia, unspecified: Secondary | ICD-10-CM

## 2013-02-27 DIAGNOSIS — E1140 Type 2 diabetes mellitus with diabetic neuropathy, unspecified: Secondary | ICD-10-CM

## 2013-02-27 DIAGNOSIS — E119 Type 2 diabetes mellitus without complications: Secondary | ICD-10-CM

## 2013-02-27 LAB — COMPREHENSIVE METABOLIC PANEL
Alkaline Phosphatase: 67 U/L (ref 39–117)
Creat: 0.74 mg/dL (ref 0.50–1.10)
Glucose, Bld: 191 mg/dL — ABNORMAL HIGH (ref 70–99)
Sodium: 142 mEq/L (ref 135–145)
Total Bilirubin: 0.8 mg/dL (ref 0.3–1.2)
Total Protein: 7.2 g/dL (ref 6.0–8.3)

## 2013-02-27 LAB — LIPID PANEL
HDL: 50 mg/dL (ref >39)
LDL Cholesterol: 67 mg/dL (ref 0–99)
Total CHOL/HDL Ratio: 2.9 Ratio
Triglycerides: 132 mg/dL (ref <150)
VLDL: 26 mg/dL (ref 0–40)

## 2013-02-27 LAB — POCT GLYCOSYLATED HEMOGLOBIN (HGB A1C): Hemoglobin A1C: 9.2

## 2013-02-27 MED ORDER — GABAPENTIN 100 MG PO CAPS: ORAL_CAPSULE | ORAL | Status: DC

## 2013-02-27 MED ORDER — INSULIN REGULAR HUMAN 100 UNIT/ML IJ SOLN: INTRAMUSCULAR | Status: DC

## 2013-02-27 NOTE — Progress Notes (Signed)
  Subjective:    Patient ID: [name], female    DOB: [birth year], 62 y.o.   MRN: [id number]  HPI patient here to followup her diabetes. She feels as though her neuropathy is getting worse. She is concerned about her upcoming trip to [place]. She is currently doing a basal rate of 2.75 and she gives 2 units per 1 g of car as a bolus before each meal. She is currently on Novolin R. she gets this over-the-counter and is much cheaper for her she denies any episodes of chest pain or shortness of breath. She's currently on Zoloft for depression.    Review of Systems     Objective:   Physical Exam patient is alert and cooperative she is not in any distress. Her neck is supple. Her chest is clear. Cardiac exam reveals regular rate no murmurs. Extremity exam reveals decreased sensation in both feet. There is no skin breakdown. Results for orders placed in visit on 02/27/13  GLUCOSE, POCT (MANUAL RESULT ENTRY)      Result Value Range   POC Glucose 213 (*) 70 - 99 mg/dl  POCT GLYCOSYLATED HEMOGLOBIN (HGB A1C)      Result Value Range   Hemoglobin A1C 9.2           Assessment & Plan:     We have increased her insulin to 2.5 units per gram of carb.She is going to use her inhaler before she goes out.

## 2013-03-07 ENCOUNTER — Other Ambulatory Visit: Payer: Self-pay | Admitting: Physician Assistant

## 2013-03-07 NOTE — Telephone Encounter (Signed)
Pt's husband calling saying pt needs a RF of her Levothyroxine. Per Dr. Cleta Alberts, ok to RF x 5. Sent to [facility]

## 2013-03-19 ENCOUNTER — Other Ambulatory Visit: Payer: Self-pay | Admitting: Physician Assistant

## 2013-03-24 ENCOUNTER — Telehealth: Payer: Self-pay

## 2013-03-24 NOTE — Telephone Encounter (Signed)
Patient calling regarding her blood pressure med (Losartan/hctz 100-25mg ).  States pharmacy has requested from [place] but hasn't heard back. Please advise. Pharmacy - Nicolette Bang at [facility]  Pt number is [phone]

## 2013-03-26 MED ORDER — LOSARTAN POTASSIUM-HCTZ 100-25 MG PO TABS: ORAL_TABLET | ORAL | Status: DC

## 2013-06-19 ENCOUNTER — Other Ambulatory Visit: Payer: Self-pay | Admitting: Radiology

## 2013-06-19 ENCOUNTER — Ambulatory Visit: Payer: Self-pay | Admitting: Emergency Medicine

## 2013-06-19 ENCOUNTER — Encounter: Payer: Self-pay | Admitting: Emergency Medicine

## 2013-06-19 VITALS — BP 168/67 | HR 74 | Temp 98.4°F | Resp 16 | Ht 64.0 in | Wt 170.0 lb

## 2013-06-19 DIAGNOSIS — Z9109 Other allergy status, other than to drugs and biological substances: Secondary | ICD-10-CM

## 2013-06-19 DIAGNOSIS — J45901 Unspecified asthma with (acute) exacerbation: Secondary | ICD-10-CM

## 2013-06-19 DIAGNOSIS — I6529 Occlusion and stenosis of unspecified carotid artery: Secondary | ICD-10-CM

## 2013-06-19 DIAGNOSIS — Z23 Encounter for immunization: Secondary | ICD-10-CM

## 2013-06-19 DIAGNOSIS — E119 Type 2 diabetes mellitus without complications: Secondary | ICD-10-CM

## 2013-06-19 MED ORDER — MONTELUKAST SODIUM 10 MG PO TABS: 10.0000 mg | ORAL_TABLET | Freq: Every day | ORAL | Status: DC

## 2013-06-19 MED ORDER — ALBUTEROL SULFATE HFA 108 (90 BASE) MCG/ACT IN AERS: 2.0000 | INHALATION_SPRAY | Freq: Four times a day (QID) | RESPIRATORY_TRACT | Status: DC | PRN

## 2013-06-19 NOTE — Progress Notes (Signed)
  Subjective:    Patient ID: [name], female    DOB: [birth year], 62 y.o.   MRN: [id number]  HPI patient here to follow multiple medical issues. She's had a recent flare of her vasculitis. She has also had problems with her asthma over the weekend. She feels this may have been related to keeping her windows open. She denies any chest pain. She has been to see Dr. Jacinto Halim and   has undergone a thorough cardiovascular evaluation. She stays around 200 with her diabetes. She has increased her basal rate to 2.75 per hour and is taking 3 units per gram of car bolus with meals.    Review of Systems     Objective:   Physical Exam HEENT exam is unremarkable. Neck is supple. There no carotid bruits. There is 2/6 murmur at the base of the heart. Abdomen is soft nontender extremity exam is shows evidence of neuropathy. She has some healing areas of vasculitis on her lower extremities  Results for orders placed in visit on 06/19/13  POCT GLYCOSYLATED HEMOGLOBIN (HGB A1C)      Result Value Range   Hemoglobin A1C 7.7          Assessment & Plan:  Have started her back on Singulair. I did refill her albuterol inhaler. We'll check a hemoglobin A1c today. I did have a coupon for one month of  dulera I gave her this prescription.

## 2013-08-04 ENCOUNTER — Other Ambulatory Visit: Payer: Self-pay | Admitting: Emergency Medicine

## 2013-08-04 ENCOUNTER — Other Ambulatory Visit: Payer: Self-pay | Admitting: Physician Assistant

## 2013-08-06 NOTE — Telephone Encounter (Signed)
appt scheduled for 10/09/12. You recently saw pt, but not for this med. Can we RF until appt? I have pended RFs.

## 2013-08-06 NOTE — Telephone Encounter (Signed)
Pt has appt scheduled 10/09/12. You've recently seen pt but not for this med. Is it OK to RF until appt? I have pended RFs.

## 2013-09-09 ENCOUNTER — Other Ambulatory Visit: Payer: Self-pay | Admitting: Physician Assistant

## 2013-10-09 ENCOUNTER — Ambulatory Visit: Payer: Self-pay | Admitting: Emergency Medicine

## 2013-10-16 ENCOUNTER — Encounter: Payer: Self-pay | Admitting: Emergency Medicine

## 2013-10-16 ENCOUNTER — Telehealth: Payer: Self-pay | Admitting: Radiology

## 2013-10-16 ENCOUNTER — Ambulatory Visit: Payer: Self-pay | Admitting: Emergency Medicine

## 2013-10-16 VITALS — BP 165/64 | HR 85 | Temp 98.4°F | Resp 16 | Ht 63.25 in | Wt 173.4 lb

## 2013-10-16 DIAGNOSIS — E119 Type 2 diabetes mellitus without complications: Secondary | ICD-10-CM

## 2013-10-16 DIAGNOSIS — F419 Anxiety disorder, unspecified: Secondary | ICD-10-CM

## 2013-10-16 DIAGNOSIS — E109 Type 1 diabetes mellitus without complications: Secondary | ICD-10-CM

## 2013-10-16 LAB — GLUCOSE, POCT (MANUAL RESULT ENTRY): POC Glucose: 332 mg/dl — AB (ref 70–99)

## 2013-10-16 LAB — POCT GLYCOSYLATED HEMOGLOBIN (HGB A1C): HEMOGLOBIN A1C: 7.9

## 2013-10-16 MED ORDER — ALPRAZOLAM 1 MG PO TABS: ORAL_TABLET | ORAL | Status: DC

## 2013-10-16 MED ORDER — SERTRALINE HCL 50 MG PO TABS: ORAL_TABLET | ORAL | Status: DC

## 2013-10-16 NOTE — Progress Notes (Addendum)
Subjective:    Patient ID: [name], female    DOB: 07-10-1951, 63 y.o.   MRN: [id number] This chart was scribed for Darlyne Russian, MD by Rolanda Lundborg, ED Scribe. This patient was seen in room 22 and the patient's care was started at 2:58 PM.  Chief Complaint  Patient presents with   Diabetes    3 month follow up    HPI HPI Comments: [name] is a 63 y.o. female who presents to the [facility] for a f/u on DM. She reports blood sugar swings due to stress form her daughter's mental illness, which was triggered by reconnecting with her birthmother. Pt states her daughter seems a little better and more sociable. Pt states she is compliant with her DM medications. She had her flu shot this year.    Past Medical History  Diagnosis Date   Diabetes mellitus    Other and unspecified hyperlipidemia    Menopause    Goiter    Depression    Asthma    Hair loss    SVT (supraventricular tachycardia)    Vasculitis    GERD (gastroesophageal reflux disease)    Positive PPD    Current Outpatient Prescriptions on File Prior to Visit  Medication Sig Dispense Refill   albuterol (PROVENTIL HFA;VENTOLIN HFA) 108 (90 BASE) MCG/ACT inhaler Inhale 2 puffs into the lungs every 6 (six) hours as needed.  8.5 g  11   albuterol (PROVENTIL) (2.5 MG/3ML) 0.083% nebulizer solution Take 2.5 mg by nebulization every 6 (six) hours as needed.       ALPRAZolam (XANAX) 1 MG tablet Take one half tablet as needed for stress  30 tablet  2   amLODipine (NORVASC) 5 MG tablet Take 1 tablet (5 mg total) by mouth daily. PATIENT NEEDS OFFICE VISIT/LABS FOR ADDITIONAL REFILLS  30 tablet  0   aspirin 81 MG tablet Take 81 mg by mouth daily.       atorvastatin (LIPITOR) 80 MG tablet Take 1 tablet (80 mg total) by mouth daily.  30 tablet  3   augmented betamethasone dipropionate (DIPROLENE-AF) 0.05 % ointment Apply topically 2 (two) times daily.       Dextrose, Diabetic  Use, (GLUCOSE PO) Take by mouth.       gabapentin (NEURONTIN) 100 MG capsule Take 3 capsules in the morning 6 capsules at night  270 capsule  5   levothyroxine (SYNTHROID, LEVOTHROID) 125 MCG tablet Take 1 tablet (125 mcg total) by mouth daily before breakfast.  30 tablet  5   losartan-hydrochlorothiazide (HYZAAR) 100-25 MG per tablet TAKE ONE TABLET BY MOUTH ONCE DAILY  30 tablet  2   montelukast (SINGULAIR) 10 MG tablet Take 1 tablet (10 mg total) by mouth at bedtime.  30 tablet  3   naproxen sodium (ANAPROX) 220 MG tablet Take 220 mg by mouth as needed.       sertraline (ZOLOFT) 50 MG tablet TAKE ONE TABLET BY MOUTH EVERY DAY BUT  DO  NOT  START  MEDICATION  UNTIL  YOU  HAVE  TAPERED  OFF  EFFEXOR  30 tablet  2   Tretinoin-Cleanser-Moisturizer 0.025 % CREAM KIT Apply topically.       Vitamin D, Ergocalciferol, (DRISDOL) 50000 UNITS CAPS TAKE ONE CAPSULE BY MOUTH ONCE A WEEK  4 capsule  5   zolpidem (AMBIEN) 5 MG tablet Take 1 tablet (5 mg total) by mouth at bedtime as needed for sleep.  [place]  tablet  0   Ergocalciferol (VITAMIN D2 PO) Take by mouth.       Fluticasone-Salmeterol (ADVAIR DISKUS) 250-50 MCG/DOSE AEPB Inhale 1 puff into the lungs 2 (two) times daily.  1 each  11   insulin regular (HUMULIN R) 100 units/mL injection 2.75 basal and 2.5 units per gram of carbs with meals  10 mL  12   POTASSIUM PO Take by mouth.       No current facility-administered medications on file prior to visit.   Allergies  Allergen Reactions   Penicillins Anaphylaxis   Ace Inhibitors     INDUCED BRONCHOSPASM     Review of Systems  Constitutional: Negative for fatigue and unexpected weight change.  Respiratory: Negative for chest tightness and shortness of breath.   Cardiovascular: Negative for chest pain, palpitations and leg swelling.  Gastrointestinal: Negative for abdominal pain and blood in stool.  Neurological: Negative for dizziness, syncope, light-headedness and headaches.         Objective:   Physical Exam CONSTITUTIONAL: Well developed/well nourished HEAD: Normocephalic/atraumatic EYES: EOMI/PERRL ENMT: Mucous membranes moist NECK: supple no meningeal signs SPINE:entire spine nontender CV: S1/S2 noted, no murmurs/rubs/gallops noted LUNGS: Lungs are clear to auscultation bilaterally, no apparent distress ABDOMEN: soft, nontender, no rebound or guarding GU:no cva tenderness NEURO: Pt is awake/alert, moves all extremitiesx4 EXTREMITIES: pulses normal, full ROM SKIN: warm, color normal PSYCH: no abnormalities of mood noted  Filed Vitals:   10/16/13 1453  BP: 165/64  Pulse: 85  Temp: 98.4 F (36.9 C)  TempSrc: Oral  Resp: 16  Height: 5' 3.25" (1.607 m)  Weight: 173 lb 6.4 oz (78.654 kg)  SpO2: 97%        Assessment & Plan:   1. DIABETES MELLITUS, TYPE I    Patient's hemoglobin A1c is acceptable. She has been under a great deal of stress recently with her daughter voicing suicidal ideations and requiring hospitalization. She feels that the stress has been difficult to keep her sugar under control. She hopes the next 3 months will do better I took the history and performed the physical examination and the documentation was performed by scribe.

## 2013-10-16 NOTE — Telephone Encounter (Signed)
Dr Cleta Albertsaub wants to verify patient is off the Effexor and on the Zoloft. Called her and this is correct. She is [facility]

## 2013-11-03 ENCOUNTER — Other Ambulatory Visit: Payer: Self-pay | Admitting: Emergency Medicine

## 2013-12-19 ENCOUNTER — Other Ambulatory Visit: Payer: Self-pay | Admitting: Emergency Medicine

## 2014-01-07 ENCOUNTER — Other Ambulatory Visit: Payer: Self-pay | Admitting: Emergency Medicine

## 2014-02-12 ENCOUNTER — Ambulatory Visit (INDEPENDENT_AMBULATORY_CARE_PROVIDER_SITE_OTHER): Payer: Self-pay | Admitting: Emergency Medicine

## 2014-02-12 DIAGNOSIS — E785 Hyperlipidemia, unspecified: Secondary | ICD-10-CM

## 2014-02-12 DIAGNOSIS — E1029 Type 1 diabetes mellitus with other diabetic kidney complication: Secondary | ICD-10-CM

## 2014-02-12 DIAGNOSIS — E109 Type 1 diabetes mellitus without complications: Secondary | ICD-10-CM

## 2014-02-12 DIAGNOSIS — E1149 Type 2 diabetes mellitus with other diabetic neurological complication: Secondary | ICD-10-CM

## 2014-02-12 DIAGNOSIS — E079 Disorder of thyroid, unspecified: Secondary | ICD-10-CM

## 2014-02-12 DIAGNOSIS — R809 Proteinuria, unspecified: Secondary | ICD-10-CM

## 2014-02-12 DIAGNOSIS — E1140 Type 2 diabetes mellitus with diabetic neuropathy, unspecified: Secondary | ICD-10-CM

## 2014-02-12 LAB — GLUCOSE, POCT (MANUAL RESULT ENTRY): POC GLUCOSE: 139 mg/dL — AB (ref 70–99)

## 2014-02-12 LAB — POCT GLYCOSYLATED HEMOGLOBIN (HGB A1C): Hemoglobin A1C: 8.2

## 2014-02-12 NOTE — Progress Notes (Signed)
   Subjective:    Patient ID: [name], female    DOB: April 24, 1951, 63 y.o.   MRN: [id number]  HPI Patient presents for follow up diabetes. She indicates she feels great, better than she has felt in years. She does have some minimal swelling of her eyes/ face, states allergies have been bad this year. She indicates when she is 37 she will be eligible for medicare, and she will go to the endocrinologist when she is on medicare. Indicates she is using the insulin pump, (72 units her fasting glucose levels are above 200. She is due for mammogram, would like to put this off also due to insurance coverage. Patient overdue for opthalmology appointment as well. Patient has had to put off much of her medical care due to lack of insurance.    Review of Systems     Objective:   Physical Exam HEENT exam is unremarkable. Neck supple no adenopathy. Chest clear to auscultation and percussion. Heart regular rate and rhythm without murmurs. Breasts without masses. Abdomen there is an insulin pump present no masses are felt. Extremity decreased feeling medial both feet pulses are intact  Results for orders placed in visit on 02/12/14  GLUCOSE, POCT (MANUAL RESULT ENTRY)      Result Value Ref Range   POC Glucose 139 (*) 70 - 99 mg/dl  POCT GLYCOSYLATED HEMOGLOBIN (HGB A1C)      Result Value Ref Range   Hemoglobin A1C 8.2          Assessment & Plan:  Opthalmology referral placed . Hemoglobin A1c is stable at 8.2 her last being 7.9. She does have microalbuminuria and has not had her eye exam for 2 years now. She is also behind on her mammograms for financial reasons and we discussed this.

## 2014-02-13 LAB — TSH: TSH: 1.694 u[IU]/mL (ref 0.350–4.500)

## 2014-02-13 LAB — LIPID PANEL
CHOLESTEROL: 158 mg/dL (ref 0–200)
HDL: 37 mg/dL — ABNORMAL LOW (ref >39)
LDL Cholesterol: 86 mg/dL (ref 0–99)
Total CHOL/HDL Ratio: 4.3 Ratio
Triglycerides: 176 mg/dL — ABNORMAL HIGH (ref <150)
VLDL: 35 mg/dL (ref 0–40)

## 2014-02-13 LAB — COMPLETE METABOLIC PANEL WITH GFR
ALT: 18 U/L (ref 0–35)
AST: 20 U/L (ref 0–37)
Albumin: 4.1 g/dL (ref 3.5–5.2)
Alkaline Phosphatase: 65 U/L (ref 39–117)
BUN: 8 mg/dL (ref 6–23)
CO2: 26 mEq/L (ref 19–32)
Calcium: 9.1 mg/dL (ref 8.4–10.5)
Chloride: 97 mEq/L (ref 96–112)
Creat: 0.65 mg/dL (ref 0.50–1.10)
GFR, Est African American: >89 mL/min
GLUCOSE: 126 mg/dL — AB (ref 70–99)
POTASSIUM: 3.4 meq/L — AB (ref 3.5–5.3)
Sodium: 139 mEq/L (ref 135–145)
Total Bilirubin: 1 mg/dL (ref 0.2–1.2)
Total Protein: 7 g/dL (ref 6.0–8.3)

## 2014-02-13 LAB — MICROALBUMIN, URINE: Microalb, Ur: 19.08 mg/dL — ABNORMAL HIGH (ref 0.00–1.89)

## 2014-04-02 ENCOUNTER — Other Ambulatory Visit: Payer: Self-pay | Admitting: Physician Assistant

## 2014-04-02 ENCOUNTER — Other Ambulatory Visit: Payer: Self-pay | Admitting: Emergency Medicine

## 2014-05-16 ENCOUNTER — Ambulatory Visit (INDEPENDENT_AMBULATORY_CARE_PROVIDER_SITE_OTHER): Payer: Self-pay | Admitting: Emergency Medicine

## 2014-05-16 VITALS — BP 162/69 | HR 80 | Temp 98.4°F | Resp 16 | Ht 63.0 in | Wt 171.0 lb

## 2014-05-16 DIAGNOSIS — Z23 Encounter for immunization: Secondary | ICD-10-CM

## 2014-05-16 DIAGNOSIS — E109 Type 1 diabetes mellitus without complications: Secondary | ICD-10-CM

## 2014-05-16 LAB — BASIC METABOLIC PANEL WITH GFR
BUN: 7 mg/dL (ref 6–23)
CALCIUM: 9.4 mg/dL (ref 8.4–10.5)
CO2: 26 meq/L (ref 19–32)
CREATININE: 0.67 mg/dL (ref 0.50–1.10)
Chloride: 104 mEq/L (ref 96–112)
GFR, Est Non African American: >89 mL/min
Glucose, Bld: 208 mg/dL — ABNORMAL HIGH (ref 70–99)
Potassium: 3.6 mEq/L (ref 3.5–5.3)
Sodium: 142 mEq/L (ref 135–145)

## 2014-05-16 LAB — POCT GLYCOSYLATED HEMOGLOBIN (HGB A1C): HEMOGLOBIN A1C: 7.8

## 2014-05-16 LAB — GLUCOSE, POCT (MANUAL RESULT ENTRY): POC Glucose: 201 mg/dl — AB (ref 70–99)

## 2014-05-16 NOTE — Progress Notes (Addendum)
Subjective:  This chart was scribed for Ashley Russian, MD by Ladene Artist, ED Scribe. The patient was seen in room 22. Patient's care was started at 9:11 AM.   Patient ID: [name], female    DOB: 1950-11-01, 63 y.o.   MRN: [id number]  Chief Complaint  Patient presents with  . Follow-up    DM, wants potassium and kidneys checked   HPI HPI Comments: [name] is a 63 y.o. female, with a h/o DM, hyperlipidemia, GERD, who presents to the [facility] for a follow-up regarding DM. Pt reports glucose level of 295 upon waking this morning. She states that she always has high readings in the mornings. Pt reports low glucose yesterday since she did not eat lunch. Pt's basal rate is 2.75. Pt recently saw her optometrist who states that her eye exam showed no side-effects of DM.  She reports intermittent chest pain at night. She reports associated SOB with exertion. Pt takes baby ASA daily. Pt has an appointment with Cardiologist Dr. Einar Gip on 05/20/14.   Pt is due for mammogram; plans to have it done in December when Medicare is approved. Pt also plans to have a sleep study test done. She suspects she has sleep apnea.   Pt plans to quit her job at the end of the year. She states that [name] is driving now. Pt also reports stress with her son's adopted 65 y.o daughter. Pt's son had to reverse the adoption after she "lost it" and the child was hospitalized. Pt kept the child for a month this summer. She describes the situation as feeling like a "death in the family".   Past Medical History  Diagnosis Date  . Diabetes mellitus   . Other and unspecified hyperlipidemia   . Menopause   . Goiter   . Depression   . Asthma   . Hair loss   . SVT (supraventricular tachycardia)   . Vasculitis   . GERD (gastroesophageal reflux disease)   . Positive PPD    Current Outpatient Prescriptions on File Prior to Visit  Medication Sig Dispense Refill  . albuterol (PROVENTIL  HFA;VENTOLIN HFA) 108 (90 BASE) MCG/ACT inhaler Inhale 2 puffs into the lungs every 6 (six) hours as needed.  8.5 g  11  . albuterol (PROVENTIL) (2.5 MG/3ML) 0.083% nebulizer solution Take 2.5 mg by nebulization every 6 (six) hours as needed.      . ALPRAZolam (XANAX) 1 MG tablet Take one half tablet as needed for stress  30 tablet  2  . amLODipine (NORVASC) 5 MG tablet TAKE ONE TABLET BY MOUTH ONCE DAILY  30 tablet  4  . aspirin 81 MG tablet Take 81 mg by mouth daily.      Marland Kitchen atorvastatin (LIPITOR) 80 MG tablet TAKE ONE TABLET BY MOUTH ONCE DAILY  30 tablet  1  . augmented betamethasone dipropionate (DIPROLENE-AF) 0.05 % ointment Apply topically 2 (two) times daily.      Marland Kitchen Dextrose, Diabetic Use, (GLUCOSE PO) Take by mouth.      . gabapentin (NEURONTIN) 100 MG capsule TAKE THREE CAPSULES BY MOUTH IN THE MORNING AND SIX CAPSULES AT NIGHT  270 capsule  4  . insulin regular (HUMULIN R) 100 units/mL injection 2.75 basal and 2.5 units per gram of carbs with meals  10 mL  12  . levothyroxine (SYNTHROID, LEVOTHROID) 125 MCG tablet Take 1 tablet (125 mcg total) by mouth daily. PATIENT NEEDS OFFICE VISIT/LABS FOR ADDITIONAL REFILLS  30 tablet  2  . losartan-hydrochlorothiazide (HYZAAR) 100-25 MG per tablet TAKE ONE TABLET BY MOUTH ONCE DAILY  30 tablet  2  . montelukast (SINGULAIR) 10 MG tablet Take 1 tablet (10 mg total) by mouth at bedtime.  30 tablet  3  . Multiple Vitamins-Minerals (MULTIVITAMIN WITH MINERALS) tablet Take 1 tablet by mouth daily.      . naproxen sodium (ANAPROX) 220 MG tablet Take 220 mg by mouth as needed.      . Probiotic Product (PROBIOTIC DAILY PO) Take by mouth.      . sertraline (ZOLOFT) 50 MG tablet Take one tablet daily  30 tablet  11  . Tretinoin-Cleanser-Moisturizer 0.025 % CREAM KIT Apply topically.      . Vitamin D, Ergocalciferol, (DRISDOL) 50000 UNITS CAPS capsule TAKE ONE CAPSULE BY MOUTH ONCE A WEEK  4 capsule  2  . zolpidem (AMBIEN) 5 MG tablet Take 1 tablet (5 mg  total) by mouth at bedtime as needed for sleep.  30 tablet  0  . Fluticasone-Salmeterol (ADVAIR DISKUS) 250-50 MCG/DOSE AEPB Inhale 1 puff into the lungs 2 (two) times daily.  1 each  11   No current facility-administered medications on file prior to visit.   Allergies  Allergen Reactions  . Penicillins Anaphylaxis  . Ace Inhibitors     INDUCED BRONCHOSPASM   Review of Systems  Constitutional: Negative for fever and chills.  Respiratory: Positive for shortness of breath. Negative for cough and chest tightness.   Cardiovascular: Positive for chest pain (intermittent). Negative for leg swelling.  Neurological: Negative for dizziness and light-headedness.      Objective:   Physical Exam CONSTITUTIONAL: Well developed/well nourished HEAD: Normocephalic/atraumatic EYES: EOMI/PERRL, contacts in ENMT: Mucous membranes moist, puffiness around thew face NECK: supple no meningeal signs SPINE:entire spine nontender CV: S1/S2 noted, no murmurs/rubs/gallops noted LUNGS: Lungs are clear to auscultation bilaterally, no apparent distress ABDOMEN: soft, nontender, no rebound or guarding GU:no cva tenderness NEURO: Pt is awake/alert, moves all extremitiesx4 EXTREMITIES: pulses normal, full ROM, normal sensation to filament testing  SKIN: warm, color normal PSYCH: no abnormalities of mood noted    Results for orders placed in visit on 05/16/14  GLUCOSE, POCT (MANUAL RESULT ENTRY)      Result Value Ref Range   POC Glucose 201 (*) 70 - 99 mg/dl  POCT GLYCOSYLATED HEMOGLOBIN (HGB A1C)      Result Value Ref Range   Hemoglobin A1C 7.8     Assessment & Plan:  Flu shot given today. Appointment to be made in Jan and will update all immunizations and screen procedures at that time. She is due to see Dr. Einar Gip on Monday. She will need a mammogram, bone density study, sleep study and pulmonary function test. Her hemoglobin A1c was down 0.4 points. She is going to cut back some on the insulin she takes  at bedtime to avoid what I think is a drop in sugar during the night and rebound hyperglycemia in the morning  I personally performed the services described in this documentation, which was scribed in my presence. The recorded information has been reviewed and is accurate.

## 2014-06-08 ENCOUNTER — Other Ambulatory Visit: Payer: Self-pay | Admitting: Emergency Medicine

## 2014-06-10 NOTE — Telephone Encounter (Signed)
Dr Cleta Alberts, I RFd 2 chronic meds, but wanted to check on vit D bc I don't see lab for it recently. Also please review controlled subst reqs.

## 2014-06-11 NOTE — Telephone Encounter (Signed)
Called in zolpidem and alprazolam.

## 2014-06-19 ENCOUNTER — Other Ambulatory Visit: Payer: Self-pay | Admitting: Emergency Medicine

## 2014-06-19 ENCOUNTER — Telehealth: Payer: Self-pay

## 2014-06-19 MED ORDER — VALACYCLOVIR HCL 1 G PO TABS: ORAL_TABLET | ORAL | Status: DC

## 2014-06-19 NOTE — Telephone Encounter (Signed)
Call patient and let her know I sent the medication in

## 2014-06-19 NOTE — Telephone Encounter (Signed)
Patient has a cold sore on her mouth and is requesting Dr. Cleta Albertsaub to call in "Valtrex". Per patient he has given it to you before. I advised patient she may be required to come in. Per patient she really does not want to come in unless its absolutely necessary. [facility] and her call back number is [phone]

## 2014-06-19 NOTE — Telephone Encounter (Signed)
Pt advised.

## 2014-08-26 DIAGNOSIS — I10 Essential (primary) hypertension: Secondary | ICD-10-CM | POA: Diagnosis not present

## 2014-08-26 DIAGNOSIS — I351 Nonrheumatic aortic (valve) insufficiency: Secondary | ICD-10-CM | POA: Diagnosis not present

## 2014-08-26 DIAGNOSIS — R0602 Shortness of breath: Secondary | ICD-10-CM | POA: Diagnosis not present

## 2014-08-26 DIAGNOSIS — E780 Pure hypercholesterolemia: Secondary | ICD-10-CM | POA: Diagnosis not present

## 2014-09-02 DIAGNOSIS — R0602 Shortness of breath: Secondary | ICD-10-CM | POA: Diagnosis not present

## 2014-09-02 DIAGNOSIS — E1165 Type 2 diabetes mellitus with hyperglycemia: Secondary | ICD-10-CM | POA: Diagnosis not present

## 2014-09-04 DIAGNOSIS — R0602 Shortness of breath: Secondary | ICD-10-CM | POA: Diagnosis not present

## 2014-09-04 DIAGNOSIS — E1165 Type 2 diabetes mellitus with hyperglycemia: Secondary | ICD-10-CM | POA: Diagnosis not present

## 2014-09-04 DIAGNOSIS — I10 Essential (primary) hypertension: Secondary | ICD-10-CM | POA: Diagnosis not present

## 2014-09-10 DIAGNOSIS — Z803 Family history of malignant neoplasm of breast: Secondary | ICD-10-CM | POA: Diagnosis not present

## 2014-09-10 DIAGNOSIS — Z1231 Encounter for screening mammogram for malignant neoplasm of breast: Secondary | ICD-10-CM | POA: Diagnosis not present

## 2014-09-18 DIAGNOSIS — Q231 Congenital insufficiency of aortic valve: Secondary | ICD-10-CM | POA: Diagnosis not present

## 2014-09-18 DIAGNOSIS — R0602 Shortness of breath: Secondary | ICD-10-CM | POA: Diagnosis not present

## 2014-09-18 DIAGNOSIS — E780 Pure hypercholesterolemia: Secondary | ICD-10-CM | POA: Diagnosis not present

## 2014-09-18 DIAGNOSIS — I700 Atherosclerosis of aorta: Secondary | ICD-10-CM | POA: Diagnosis not present

## 2014-09-19 ENCOUNTER — Ambulatory Visit (INDEPENDENT_AMBULATORY_CARE_PROVIDER_SITE_OTHER): Payer: Medicare Other | Admitting: Emergency Medicine

## 2014-09-19 ENCOUNTER — Encounter: Payer: Self-pay | Admitting: Emergency Medicine

## 2014-09-19 VITALS — BP 162/71 | HR 83 | Temp 98.3°F | Resp 16 | Ht 63.5 in | Wt 165.4 lb

## 2014-09-19 DIAGNOSIS — Z794 Long term (current) use of insulin: Secondary | ICD-10-CM

## 2014-09-19 DIAGNOSIS — Z23 Encounter for immunization: Secondary | ICD-10-CM

## 2014-09-19 DIAGNOSIS — IMO0001 Reserved for inherently not codable concepts without codable children: Secondary | ICD-10-CM

## 2014-09-19 DIAGNOSIS — M8580 Other specified disorders of bone density and structure, unspecified site: Secondary | ICD-10-CM

## 2014-09-19 DIAGNOSIS — E119 Type 2 diabetes mellitus without complications: Secondary | ICD-10-CM

## 2014-09-19 DIAGNOSIS — J45909 Unspecified asthma, uncomplicated: Secondary | ICD-10-CM

## 2014-09-19 DIAGNOSIS — M859 Disorder of bone density and structure, unspecified: Secondary | ICD-10-CM

## 2014-09-19 MED ORDER — ALBUTEROL SULFATE HFA 108 (90 BASE) MCG/ACT IN AERS: 2.0000 | INHALATION_SPRAY | Freq: Four times a day (QID) | RESPIRATORY_TRACT | Status: DC | PRN

## 2014-09-19 MED ORDER — ATORVASTATIN CALCIUM 40 MG PO TABS: ORAL_TABLET | ORAL | Status: DC

## 2014-09-19 MED ORDER — ALBUTEROL SULFATE (2.5 MG/3ML) 0.083% IN NEBU: 2.5000 mg | INHALATION_SOLUTION | Freq: Four times a day (QID) | RESPIRATORY_TRACT | Status: DC | PRN

## 2014-09-19 NOTE — Progress Notes (Addendum)
   Subjective:  This chart was scribed for Collene GobbleSteven A Copelyn Widmer, MD by Milly JakobJohn Lee Graves, ED Scribe. The patient was seen in room 21. Patient's care was started at 8:56 AM.   Patient ID: [name], female    DOB: [birth year], 64 y.o.   MRN: [id number]  Hypertension Pertinent negatives include no chest pain, headaches, neck pain, palpitations or shortness of breath.    HPI Comments: [name] is a 64 y.o. female who presents to the [facility] for a routine checkup. She states that she recently saw cardiologist Dr. Nadara EatonGangi after her Echo, and that she has a bicuspid valve that may need attention eventualy. She states that she would like to split her simvastatin and take 40 MG per day. She states that her blood sugars have been good, except for Thanksgiving and Christmas. She agrees to see an endocrinologist, Dr. Horald PollenBalen. She uses an insulin pump, for which she is requesting a written prescription for refill. She agrees to take the Prevnar vaccine today. She states that her 66 year old daughter causes her some stress at home.    Review of Systems  Constitutional: Negative for fever, chills, activity change and appetite change.  HENT: Negative for congestion, ear pain, rhinorrhea, sinus pressure and sore throat.   Respiratory: Negative for chest tightness, shortness of breath and wheezing.   Cardiovascular: Negative for chest pain and palpitations.  Gastrointestinal: Negative for nausea, vomiting, abdominal pain, diarrhea, constipation and abdominal distention.  Genitourinary: Negative for dysuria, urgency, frequency, hematuria and difficulty urinating.  Musculoskeletal: Negative for back pain, arthralgias and neck pain.  Skin: Negative for rash and wound.  Neurological: Negative for dizziness, weakness, numbness and headaches.     Objective:  Physical Exam  CONSTITUTIONAL: Well developed/well nourished HEAD: Normocephalic/atraumatic EYES: EOMI/PERRL ENMT: Mucous  membranes moist NECK: supple no meningeal signs SPINE/BACK:entire spine nontender CV: S1/S2 noted, 2/6 systolic murmur at the base of the heart LUNGS: Lungs are clear to auscultation bilaterally, no apparent distress ABDOMEN: soft, nontender, no rebound or guarding, bowel sounds noted throughout abdomen GU:no cva tenderness NEURO: Pt is awake/alert/appropriate, moves all extremitiesx4.  No facial droop.   EXTREMITIES: pulses normal/equal, full ROM SKIN: warm, color normal PSYCH: no abnormalities of mood noted, alert and oriented to situation  Assessment & Plan:  Repeat blood pressure was 140/60. Referral made to Dr. Horald PollenBalen for help with her diabetes. She was given a Prevnar vaccine today. No blood work done today recheck 4 months.I personally performed the services described in this documentation, which was scribed in my presence. The recorded information has been reviewed and is accurate.

## 2014-09-25 ENCOUNTER — Encounter: Payer: Self-pay | Admitting: Neurology

## 2014-09-25 ENCOUNTER — Ambulatory Visit (INDEPENDENT_AMBULATORY_CARE_PROVIDER_SITE_OTHER): Payer: Medicare Other | Admitting: Neurology

## 2014-09-25 VITALS — BP 134/65 | HR 72 | Resp 14 | Ht 63.0 in | Wt 171.0 lb

## 2014-09-25 DIAGNOSIS — M791 Myalgia, unspecified site: Secondary | ICD-10-CM

## 2014-09-25 DIAGNOSIS — J4530 Mild persistent asthma, uncomplicated: Secondary | ICD-10-CM

## 2014-09-25 DIAGNOSIS — R922 Inconclusive mammogram: Secondary | ICD-10-CM | POA: Diagnosis not present

## 2014-09-25 DIAGNOSIS — R351 Nocturia: Secondary | ICD-10-CM | POA: Diagnosis not present

## 2014-09-25 DIAGNOSIS — E1140 Type 2 diabetes mellitus with diabetic neuropathy, unspecified: Secondary | ICD-10-CM | POA: Diagnosis not present

## 2014-09-25 DIAGNOSIS — R0683 Snoring: Secondary | ICD-10-CM

## 2014-09-25 DIAGNOSIS — R928 Other abnormal and inconclusive findings on diagnostic imaging of breast: Secondary | ICD-10-CM | POA: Diagnosis not present

## 2014-09-25 DIAGNOSIS — J45909 Unspecified asthma, uncomplicated: Secondary | ICD-10-CM | POA: Insufficient documentation

## 2014-09-25 MED ORDER — GABAPENTIN 100 MG PO CAPS: 100.0000 mg | ORAL_CAPSULE | Freq: Three times a day (TID) | ORAL | Status: DC

## 2014-09-25 NOTE — Progress Notes (Signed)
SLEEP MED[facility]INE CLINIC   Provider:  Larey Seat, M D  Referring Provider: Darlyne Russian, MD Primary Care Physician:  Jenny Reichmann, MD  Chief Complaint  Patient presents with  . NP [facility] Sleep Consult    Rm 10, Alone    HPI:  [name] is a 64 y.o. female seen here as a referral  from Dr. Einar Gip and Everlene Farrier for a sleep evaluation.  [name] is a right-handed, ambi-dexterous, Caucasian female patient of Dr. Willaim Bane. It was him who sent her here today for a sleep evaluation the patient reports that she has been known to snore and that she feels she sleeps deep and sound and heart". She endorses that she has a heart murmur often feels fatigued in daytime and sometimes has wheezing again snoring she has the environmental allergies and some component of restless legs is noted. She also feels often hot cold or has increased thirst and endorsed flushing. Already established our diagnoses of hypertension, diabetes, high cholesterol, heart disease, fibromyalgia, anxiety-depression and the patient has undergone a hysterectomy and cholecystectomy in the past. Dr. Willaim Bane reported that she has aortic and mitral valve leakage, and incomplete right bundle branch block, and an enlarged left atrium. She was referred to Dr. Michiel Sites recently for uncontrolled diabetes.  The patient usually goes to bed about 10:30 sometimes she will read occasionally she will watch TV but this seems not to be a routine. Her bedroom is described as cool, quiet and dark. The couple sleeps in different bedrooms as a bolus find the partner snoring disturbing. She will wake up up to 4 or 5 times at night from her, some of these are nocturia is but mostly she goes to the bathroom because she is already awake. She will wake up about 7:30 usually without alarm.  She estimates that her total sleep time is 6 hours or less overnight. She does not nap in daytime. Due to her condition of fibromyalgia she associates the discomfort often  with sleep interruption. When she rises she will take coffee usually she has breakfast. She just is a decided to retire in December 2015. Her previous job was a daytime employment and she was self-employed as a Scientist, forensic and worked at [employer].     She has no history of TBI, of skull  trauma . She broke her nose once. No nasal septum deviation.       Review of Systems: Out of a complete 14 system review, the patient complains of only the following symptoms, and all other reviewed systems are negative. asthma with wheezing, obesity, diabetes wit nocturia and neuropathy, strong family  history of diabetes.  Dysphagia, snoring, fatigue, pain in muscles,tendons and joints. Nocturia , snoring   Epworth score 5 , Fatigue severity score 37  , depression score 2    History   Social History  . Marital Status: Married    Spouse Name: N/A    Number of Children: N/A  . Years of Education: N/A   Occupational History  . Not on file.   Social History Main Topics  . Smoking status: Former Smoker -- 15 years    Types: Cigarettes    Quit date: 03/13/1988  . Smokeless tobacco: Not on file  . Alcohol Use: No  . Drug Use: No  . Sexual Activity: Not on file   Other Topics Concern  . Not on file   Social History Narrative   Caffeine    Family History  Problem Relation Age of Onset  . Hepatitis Mother 58    died from [place]  . Diabetes Father   . Cancer Brother   . Crohn's disease Brother   . Alcohol abuse Brother     Past Medical History  Diagnosis Date  . Diabetes mellitus   . Other and unspecified hyperlipidemia   . Menopause   . Goiter   . Depression   . Asthma   . Hair loss   . SVT (supraventricular tachycardia)   . Vasculitis   . GERD (gastroesophageal reflux disease)   . Positive PPD   . Hypertension     Past Surgical History  Procedure Laterality Date  . Appendectomy    . Partial hysterectomy    . Cholecystectomy      Current Outpatient  Prescriptions  Medication Sig Dispense Refill  . albuterol (PROVENTIL HFA;VENTOLIN HFA) 108 (90 BASE) MCG/ACT inhaler Inhale 2 puffs into the lungs every 6 (six) hours as needed. 8.5 g 11  . albuterol (PROVENTIL) (2.5 MG/3ML) 0.083% nebulizer solution Take 3 mLs (2.5 mg total) by nebulization every 6 (six) hours as needed. 75 mL 1  . ALPRAZolam (XANAX) 1 MG tablet TAKE ONE-HALF TABLET BY MOUTH AS NEEDED FOR  STRESS 30 tablet 0  . amLODipine (NORVASC) 5 MG tablet TAKE ONE TABLET BY MOUTH ONCE DAILY 30 tablet 4  . aspirin 81 MG tablet Take 81 mg by mouth daily.    Marland Kitchen atorvastatin (LIPITOR) 40 MG tablet Take 2 tablets daily at bedtime 180 tablet 3  . augmented betamethasone dipropionate (DIPROLENE-AF) 0.05 % ointment Apply topically 2 (two) times daily.    Marland Kitchen BIOTIN PO Take 10,000 mcg by mouth daily.     Marland Kitchen Dextrose, Diabetic Use, (GLUCOSE PO) Take by mouth as needed.     . gabapentin (NEURONTIN) 100 MG capsule TAKE THREE CAPSULES BY MOUTH IN THE MORNING AND SIX CAPSULES AT NIGHT 270 capsule 4  . insulin regular (HUMULIN R) 100 units/mL injection 2.75 basal and 2.5 units per gram of carbs with meals 10 mL 12  . levothyroxine (SYNTHROID, LEVOTHROID) 125 MCG tablet Take 1 tablet (125 mcg total) by mouth daily. 30 tablet 5  . losartan-hydrochlorothiazide (HYZAAR) 100-25 MG per tablet TAKE ONE TABLET BY MOUTH ONCE DAILY 30 tablet 5  . montelukast (SINGULAIR) 10 MG tablet Take 1 tablet (10 mg total) by mouth at bedtime. 30 tablet 3  . Multiple Vitamins-Minerals (MULTIVITAMIN WITH MINERALS) tablet Take 1 tablet by mouth daily.    . naproxen sodium (ANAPROX) 220 MG tablet Take 220 mg by mouth as needed.    . Probiotic Product (PROBIOTIC DAILY PO) Take by mouth.    . sertraline (ZOLOFT) 50 MG tablet Take one tablet daily 30 tablet 11  . Tretinoin-Cleanser-Moisturizer 0.025 % CREAM KIT Apply topically.    . Vitamin D, Ergocalciferol, (DRISDOL) 50000 UNITS CAPS capsule TAKE ONE CAPSULE BY MOUTH ONCE A WEEK 4  capsule 5  . zolpidem (AMBIEN) 10 MG tablet TAKE ONE-HALF TABLET BY MOUTH AT BEDTIME AS NEEDED 30 tablet 0   No current facility-administered medications for this visit.    Allergies as of 09/25/2014 - Review Complete 09/25/2014  Allergen Reaction Noted  . Penicillins Anaphylaxis   . Ace inhibitors  11/04/2011    Vitals: BP 134/65 mmHg  Pulse 72  Resp 14  Ht 5' 3"  (1.6 m)  Wt 171 lb (77.565 kg)  BMI 30.30 kg/m2 Last Weight:  Wt Readings from Last 1 Encounters:  09/25/14 171  lb (77.565 kg)       Last Height:   Ht Readings from Last 1 Encounters:  09/25/14 5' 3"  (1.6 m)    Physical exam:  General: The patient is awake, alert and appears not in acute distress. The patient is well groomed. Head: Normocephalic, atraumatic. Neck is supple. Mallampati 2 ,  neck circumference: 16 5 . Nasal airflow unrestricted , TMJ is not evident . Retrognathia is  seen.  Cardiovascular:  Regular rate and rhythm  without carotid bruit, and without distended neck veins. Respiratory: Lungs are clear to auscultation. Skin:  Without evidence of edema, or rash Trunk: BMI is  elevated and patient  has normal posture.  Neurologic exam : The patient is awake and alert, oriented to place and time.   Memory subjective  described as intact. There is a normal attention span & concentration ability. Speech is fluent without dysarthria, dysphonia or aphasia. Mood and affect are appropriate.  Cranial nerves: Pupils are equal and briskly reactive to light. Funduscopic exam without  evidence of pallor or edema.  Extraocular movements  in vertical and horizontal planes intact and without nystagmus. Visual fields by finger perimetry are intact. Hearing to finger rub intact.  Facial sensation intact to fine touch. Facial motor strength is symmetric and tongue and uvula move midline. Tongue protrudes normal, shoulder shrug is pain restricted.   Motor exam:  Elevated  tone , muscle tenderness and failure of  relaxation- normal muscle  bulk and symmetric ,strength in all extremities. Pain over the shoulders. Crepitation.   Sensory:  Fine touch, pinprick and vibration were tested in all extremities. Feet are cold to touch, pale, and loss of hair is noted.  Pin and needle dyseasthesias.  Proprioception is  normal.  Coordination: Rapid alternating movements in the fingers/hands is normal. Finger-to-nose maneuver  normal without evidence of ataxia, dysmetria or tremor.  Gait and station: Patient walks without assistive device and is able unassisted to climb up to the exam table. Strength within normal limits. Stance is stable and normal.  Tandem gait is unfragmented. Romberg testing is negative.  Deep tendon reflexes: in the  upper and lower extremities are symmetric and intact.  Babinski maneuver response is downgoing.   Assessment:  After physical and neurologic examination, review of laboratory studies, imaging, neurophysiology testing and pre-existing records, assessment is  1) [name] is a patient with a chief complaint of nonrestorative sleep, and multiple factors play a role in this. There is a snoring  Witnessed apnea -reported by her husband. There is less restful and less sound sleep due to underlying pain and discomfort, attributed to fibromyalgia.  2)There is sleep interruption from nocturia which can be diabetes related but nocturia can also occur in obstructive sleep apnea. The patient sometimes wakes up with headaches in the morning and she may revealed retain CO2 overnight which would lead to avascular dilation and a global headache but then clears as the day goes on. 3) She is more prone to have this condition because she has a primary pulmonary abnormality with a diagnosis of asthma.  4)Her hypertension may be better controlled if apnea is found and treated. Hypoxemia and hypercapnia can also be treated with CPAP and often allow a patient continues restful and more sound sleep with  less nocturia without a dry mouth and waking up without headaches.  The patient was advised of the nature of the diagnosed sleep disorder , the treatment options and risks for general a health and wellness arising  from not treating the condition. Visit duration was 45 minutes.more than 50% of our face to face time was dedicated to the diagnosis discussion, explanation of the choice of tests and which treatments may result.   Plan:  Treatment plan and additional workup : split study with CO2 and begin neuropathic treatment with neurontin.   Asencion Partridge Jalayiah Bibian MD  09/25/2014

## 2014-10-11 ENCOUNTER — Encounter: Payer: Self-pay | Admitting: Emergency Medicine

## 2014-10-14 ENCOUNTER — Encounter: Payer: Self-pay | Admitting: Emergency Medicine

## 2014-10-17 ENCOUNTER — Ambulatory Visit (INDEPENDENT_AMBULATORY_CARE_PROVIDER_SITE_OTHER): Payer: Medicare Other | Admitting: Neurology

## 2014-10-17 VITALS — BP 154/76 | HR 74 | Ht 63.0 in | Wt 171.0 lb

## 2014-10-17 DIAGNOSIS — J4530 Mild persistent asthma, uncomplicated: Secondary | ICD-10-CM

## 2014-10-17 DIAGNOSIS — R351 Nocturia: Secondary | ICD-10-CM | POA: Diagnosis not present

## 2014-10-17 DIAGNOSIS — G4733 Obstructive sleep apnea (adult) (pediatric): Secondary | ICD-10-CM | POA: Diagnosis not present

## 2014-10-17 DIAGNOSIS — E1140 Type 2 diabetes mellitus with diabetic neuropathy, unspecified: Secondary | ICD-10-CM

## 2014-10-17 DIAGNOSIS — R0683 Snoring: Secondary | ICD-10-CM

## 2014-10-17 DIAGNOSIS — M791 Myalgia, unspecified site: Secondary | ICD-10-CM

## 2014-10-18 NOTE — Sleep Study (Signed)
Please see the scanned sleep study interpretation located in the procedure tab within the chart review section.   

## 2014-10-31 ENCOUNTER — Encounter: Payer: Self-pay | Admitting: Neurology

## 2014-11-01 ENCOUNTER — Telehealth: Payer: Self-pay | Admitting: *Deleted

## 2014-11-01 ENCOUNTER — Other Ambulatory Visit: Payer: Self-pay | Admitting: Neurology

## 2014-11-01 ENCOUNTER — Encounter: Payer: Self-pay | Admitting: *Deleted

## 2014-11-01 DIAGNOSIS — G4733 Obstructive sleep apnea (adult) (pediatric): Secondary | ICD-10-CM

## 2014-11-01 NOTE — Telephone Encounter (Signed)
Patient was contacted and provided the results of her split night sleep study that revealed mild to moderate sleep apnea.  Patient stated it was the best night sleep she had got in a long time.  [facility]  Dr. Yates DecampJay Ganji and Dr. Lesle ChrisSteven Daub were faxed results of the sleep study.  The patient gave verbal permission to mail a copy of her test results.   Patient instructed to contact our office 6-8 weeks post set up to schedule a follow up appointment.

## 2014-11-07 ENCOUNTER — Encounter: Payer: Self-pay | Admitting: Emergency Medicine

## 2014-11-07 ENCOUNTER — Other Ambulatory Visit: Payer: Self-pay | Admitting: Emergency Medicine

## 2014-11-07 MED ORDER — DOXYCYCLINE HYCLATE 100 MG PO TABS: 100.0000 mg | ORAL_TABLET | Freq: Two times a day (BID) | ORAL | Status: DC

## 2014-11-08 ENCOUNTER — Other Ambulatory Visit: Payer: Self-pay | Admitting: Emergency Medicine

## 2014-11-11 ENCOUNTER — Other Ambulatory Visit: Payer: Self-pay

## 2014-11-11 DIAGNOSIS — E780 Pure hypercholesterolemia: Secondary | ICD-10-CM | POA: Diagnosis not present

## 2014-11-11 DIAGNOSIS — E039 Hypothyroidism, unspecified: Secondary | ICD-10-CM | POA: Diagnosis not present

## 2014-11-11 DIAGNOSIS — I10 Essential (primary) hypertension: Secondary | ICD-10-CM | POA: Diagnosis not present

## 2014-11-11 DIAGNOSIS — E1165 Type 2 diabetes mellitus with hyperglycemia: Secondary | ICD-10-CM | POA: Diagnosis not present

## 2014-11-11 NOTE — Telephone Encounter (Signed)
Rx faxed, Alprazolam 1 mg.

## 2014-11-15 ENCOUNTER — Encounter: Payer: Self-pay | Admitting: Neurology

## 2014-12-19 ENCOUNTER — Ambulatory Visit: Payer: No Typology Code available for payment source | Admitting: Emergency Medicine

## 2014-12-20 ENCOUNTER — Ambulatory Visit (INDEPENDENT_AMBULATORY_CARE_PROVIDER_SITE_OTHER): Payer: Medicare Other | Admitting: Emergency Medicine

## 2014-12-20 ENCOUNTER — Encounter: Payer: Self-pay | Admitting: Emergency Medicine

## 2014-12-20 VITALS — BP 138/62 | HR 75 | Temp 98.6°F | Resp 16 | Ht 63.5 in | Wt 169.2 lb

## 2014-12-20 DIAGNOSIS — Z794 Long term (current) use of insulin: Secondary | ICD-10-CM | POA: Diagnosis not present

## 2014-12-20 DIAGNOSIS — IMO0001 Reserved for inherently not codable concepts without codable children: Secondary | ICD-10-CM

## 2014-12-20 DIAGNOSIS — E119 Type 2 diabetes mellitus without complications: Secondary | ICD-10-CM | POA: Diagnosis not present

## 2014-12-20 LAB — POCT GLYCOSYLATED HEMOGLOBIN (HGB A1C): HEMOGLOBIN A1C: 6.7

## 2014-12-20 MED ORDER — ROSUVASTATIN CALCIUM 20 MG PO TABS: 20.0000 mg | ORAL_TABLET | Freq: Every day | ORAL | Status: DC

## 2014-12-20 NOTE — Progress Notes (Signed)
   Subjective:    Patient ID: [name], female    DOB: 08-Mar-1951, 64 y.o.   MRN: [id number]  HPI patient enters for follow-up. She has diabetes hyperlipidemia and is followed by Dr.  Jacinto HalimGanji  for her cardiac issues. She feels great. She is working regularly. She is stable on her current medications. Her drug company has suggested she change from Lipitor to Crestor.    Review of Systems     Objective:   Physical Exam  Constitutional: She appears well-developed and well-nourished.  HENT:  Head: Normocephalic.  Eyes: Pupils are equal, round, and reactive to light.  Neck: No thyromegaly present.  Cardiovascular: Normal rate and regular rhythm.  Exam reveals no gallop and no friction rub.   No murmur heard. Pulmonary/Chest: Effort normal. She has no wheezes. She has no rales.  Abdominal: Soft.          Assessment & Plan:   Patient is doing well. I changed her Lipitor 80 to Crestor 20 for financial reasons. We'll recheck in July. She is up-to-date on all her vaccinations.I personally performed the services described in this documentation, which was scribed in my presence. The recorded information has been reviewed and is accurate.

## 2014-12-27 ENCOUNTER — Other Ambulatory Visit: Payer: Self-pay | Admitting: Emergency Medicine

## 2015-01-03 ENCOUNTER — Encounter: Payer: Self-pay | Admitting: Neurology

## 2015-01-17 ENCOUNTER — Encounter: Payer: Self-pay | Admitting: Neurology

## 2015-01-17 ENCOUNTER — Ambulatory Visit (INDEPENDENT_AMBULATORY_CARE_PROVIDER_SITE_OTHER): Payer: Medicare Other | Admitting: Neurology

## 2015-01-17 VITALS — BP 138/60 | HR 68 | Resp 20 | Ht 64.0 in | Wt 171.0 lb

## 2015-01-17 DIAGNOSIS — G4733 Obstructive sleep apnea (adult) (pediatric): Secondary | ICD-10-CM

## 2015-01-17 DIAGNOSIS — Z87898 Personal history of other specified conditions: Secondary | ICD-10-CM | POA: Diagnosis not present

## 2015-01-17 DIAGNOSIS — Z9989 Dependence on other enabling machines and devices: Principal | ICD-10-CM

## 2015-01-17 NOTE — Progress Notes (Signed)
SLEEP MEDICINE CLINIC   Provider:  Larey Seat, M D  Referring Provider: Darlyne Russian, MD Primary Care Physician:  Jenny Reichmann, MD  Chief Complaint  Patient presents with  . Follow-up    cpap, rm 10, alone    HPI:  [name] is a 64 y.o. female seen here as a referral  from Dr. Einar Gip and Everlene Farrier for a sleep evaluation.  Mrs. [name] is a right-handed, ambi-dexterous, Caucasian female patient of Dr. Willaim Bane. It was him who sent her here today for a sleep evaluation the patient reports that she has been known to snore and that she feels she sleeps deep and sound and heart". She endorses that she has a heart murmur often feels fatigued in daytime and sometimes has wheezing again snoring she has the environmental allergies and some component of restless legs is noted. She also feels often hot cold or has increased thirst and endorsed flushing. Already established our diagnoses of hypertension, diabetes, high cholesterol, heart disease, fibromyalgia, anxiety-depression and the patient has undergone a hysterectomy and cholecystectomy in the past. Dr. Willaim Bane reported that she has aortic and mitral valve leakage, and incomplete right bundle branch block, and an enlarged left atrium. She was referred to Dr. Michiel Sites recently for uncontrolled diabetes.  The patient usually goes to bed about 10:30 sometimes she will read occasionally she will watch TV but this seems not to be a routine. Her bedroom is described as cool, quiet and dark. The couple sleeps in different bedrooms as a bolus find the partner snoring disturbing. She will wake up up to 4 or 5 times at night from her, some of these are nocturia is but mostly she goes to the bathroom because she is already awake. She will wake up about 7:30 usually without alarm.  She estimates that her total sleep time is 6 hours or less overnight. She does not nap in daytime. Due to her condition of fibromyalgia she associates the discomfort often with  sleep interruption. When she rises she will take coffee usually she has breakfast. She just is a decided to retire in December 2015. Her previous job was a daytime employment and she was self-employed as a Scientist, forensic and worked at [employer].She has no history of TBI, of skull  trauma . She broke her nose once. No nasal septum deviation.   1) [name] is a patient with a chief complaint of nonrestorative sleep, and multiple factors play a role in this. There is a snoring  Witnessed apnea -reported by her husband. There is less restful and less sound sleep due to underlying pain and discomfort, attributed to fibromyalgia.  2)There is sleep interruption from nocturia which can be diabetes related but nocturia can also occur in obstructive sleep apnea. The patient sometimes wakes up with headaches in the morning and she may revealed retain CO2 overnight which would lead to avascular dilation and a global headache but then clears as the day goes on. She is more prone to have this condition because she has a primary pulmonary abnormality with a diagnosis of asthma. 3)Her hypertension may be better controlled if apnea is found and treated. Hypoxemia and hypercapnia can also be treated with CPAP and often allow a patient continues restful and more sound sleep with less nocturia without a dry mouth and waking up without headaches.  The patient was advised of the nature of the diagnosed sleep disorder , the treatment options and risks for general a health  and wellness arising from not treating the condition. Visit duration was 45 minutes.more than 50% of our face to face time was dedicated to the diagnosis discussion, explanation of the choice of tests and which treatments may result.  Interval history:  01-17-15  I have the pleasure of seeing Mrs. [name] today in a follow-up after her recent adult patient split-night polysomnogram she was recorded on 10-17-14. Her apnea index was 15 and her RDI 16.5. It was  remarkable that there was a strong positional component as she had an AHI of 25.7 in supine. REM sleep further accentuated strongly her apnea. Her REM sleep AHI was 60. So in dream sleep and on your backyard the lowest oxygen saturation was 81% was 20 minutes of desaturation time. She also retained CO2 to a level of 52.24 during REM sleep area did she had PLM's with an arousal index of 4.3. Her heart rate however remained in sinus rhythm and regular throughout the night. Based on these results she was titrated to CPAP beginning at 4 and is a goal of 6 cm water she reached 0 apneas per hour.  The patient has now had 30 days to try CPAP and she enjoys the therapy she states that it is safe to her life. She has sounder restful sleep she feels restored in the morning does not have any bathroom breaks during the night, her family her spouse have all noticed that she is no longer snoring. She also told me an interesting anecdotes she was very mad at her little ducks and because she fell that he woke her up for no reason of 5 or 6 times at night it turns out that since she has no longer apnea and sleeps regular and brief regular through the night the ducks and has not woken her. She jokingly referred to him as her therapy dog. She now sleeps 7 to 8 hours and needs no naps.  I have also the pleasure to see a 30 day download of her CPAP compliance today she has used the machine 100% of the time and 93% of the nights over 4 hours. She is highly compliant by Medicare criteria. She uses the machine on average over all days at 7 hours and 31 minutes the set machine pressure is 6 cm water and her AHI is 3.6. I consider any AHI below 5 to be of documented benefit. Based on these results and the good clinical response of the patient I would consider seeing her again in a year for a regular compliance follow-up. I would also like to add that her geriatric depression score was endorsed at 3 points and she is currently on  antidepressants. Her Epworth sleepiness score is now at 2 points and her fatigue severity score at 29 points. All these are desirable levels.    Review of Systems: Out of a complete 14 system review, the patient complains of only the following symptoms, and all other reviewed systems are negative. asthma with wheezing improved , obesity, diabetes no longer nocturia . Noticed improvement in hypertension, her nocturia was obviously not just diabetic related. And she has noticed that she hasn't used her inhalers as much as prior to CPAP therapy Epworth score 2, Fatigue severity score 29  , depression score 2    History   Social History  . Marital Status: Married    Spouse Name: N/A  . Number of Children: N/A  . Years of Education: N/A   Occupational History  . Not on  file.   Social History Main Topics  . Smoking status: Former Smoker -- 15 years    Types: Cigarettes    Quit date: 03/13/1988  . Smokeless tobacco: Not on file  . Alcohol Use: No  . Drug Use: No  . Sexual Activity: Not on file   Other Topics Concern  . Not on file   Social History Narrative   Caffeine 1-2 cups coffee daily.   Lives at home with husband and daughter.   Retired.   Associates degree.   Has 4 kids.    Family History  Problem Relation Age of Onset  . Hepatitis Mother 67    died from [place]  . Diabetes Father   . Cancer Brother   . Crohn's disease Brother   . Alcohol abuse Brother     Past Medical History  Diagnosis Date  . Diabetes mellitus   . Other and unspecified hyperlipidemia   . Menopause   . Goiter   . Depression   . Asthma   . Hair loss   . SVT (supraventricular tachycardia)   . Vasculitis   . GERD (gastroesophageal reflux disease)   . Positive PPD   . Hypertension   . Depression   . Anxiety   . Fibromyalgia     Past Surgical History  Procedure Laterality Date  . Appendectomy    . Partial hysterectomy    . Cholecystectomy      Current Outpatient Prescriptions    Medication Sig Dispense Refill  . albuterol (PROVENTIL HFA;VENTOLIN HFA) 108 (90 BASE) MCG/ACT inhaler Inhale 2 puffs into the lungs every 6 (six) hours as needed. 8.5 g 11  . albuterol (PROVENTIL) (2.5 MG/3ML) 0.083% nebulizer solution Take 3 mLs (2.5 mg total) by nebulization every 6 (six) hours as needed. 75 mL 1  . ALPRAZolam (XANAX) 1 MG tablet TAKE ONE-HALF TABLET BY MOUTH ONCE DAILY AS NEEDED FOR STRESS. 30 tablet 0  . amLODipine (NORVASC) 5 MG tablet TAKE ONE TABLET BY MOUTH ONCE DAILY 30 tablet 5  . aspirin 81 MG tablet Take 81 mg by mouth daily.    Marland Kitchen augmented betamethasone dipropionate (DIPROLENE-AF) 0.05 % ointment Apply topically 2 (two) times daily.    Marland Kitchen BIOTIN PO Take 10,000 mcg by mouth daily.     . cetirizine (ZYRTEC) 10 MG tablet Take 10 mg by mouth daily.    Marland Kitchen Dextrose, Diabetic Use, (GLUCOSE PO) Take by mouth as needed.     . gabapentin (NEURONTIN) 100 MG capsule TAKE THREE CAPSULES BY MOUTH IN THE MORNING AND SIX CAPSULES AT NIGHT 270 capsule 4  . HUMALOG 100 UNIT/ML injection     . levothyroxine (SYNTHROID, LEVOTHROID) 125 MCG tablet Take 1 tablet (125 mcg total) by mouth daily. 30 tablet 5  . losartan-hydrochlorothiazide (HYZAAR) 100-25 MG per tablet TAKE ONE TABLET BY MOUTH ONCE DAILY 30 tablet 5  . montelukast (SINGULAIR) 10 MG tablet TAKE ONE TABLET BY MOUTH ONCE DAILY AT BEDTIME 30 tablet 4  . Multiple Vitamins-Minerals (MULTIVITAMIN WITH MINERALS) tablet Take 1 tablet by mouth daily.    . naproxen sodium (ANAPROX) 220 MG tablet Take 220 mg by mouth as needed.    . potassium chloride SA (K-DUR,KLOR-CON) 20 MEQ tablet Take 20 mEq by mouth 2 (two) times daily.    . Probiotic Product (PROBIOTIC DAILY PO) Take by mouth.    . rosuvastatin (CRESTOR) 20 MG tablet Take 1 tablet (20 mg total) by mouth daily. 90 tablet 3  . sertraline (ZOLOFT) 50  MG tablet TAKE ONE TABLET BY MOUTH ONCE DAILY 30 tablet 4  . Tretinoin-Cleanser-Moisturizer 0.025 % CREAM KIT Apply topically.    .  Vitamin D, Ergocalciferol, (DRISDOL) 50000 UNITS CAPS capsule TAKE ONE CAPSULE BY MOUTH ONCE A WEEK 4 capsule 5  . zolpidem (AMBIEN) 10 MG tablet TAKE ONE-HALF TABLET BY MOUTH AT BEDTIME AS NEEDED 30 tablet 0  . insulin regular (HUMULIN R) 100 units/mL injection 2.75 basal and 2.5 units per gram of carbs with meals (Patient not taking: Reported on 12/20/2014) 10 mL 12   No current facility-administered medications for this visit.    Allergies as of 01/17/2015 - Review Complete 01/17/2015  Allergen Reaction Noted  . Penicillins Anaphylaxis   . Ace inhibitors  11/04/2011    Vitals: BP 138/60 mmHg  Pulse 68  Resp 20  Ht _0  (1.626 m)  Wt 171 lb (77.565 kg)  BMI 29.34 kg/m2 Last Weight:  Wt Readings from Last 1 Encounters:  01/17/15 171 lb (77.565 kg)       Last Height:   Ht Readings from Last 1 Encounters:  01/17/15 _1  (1.626 m)    Physical exam:  General: The patient is awake, alert and appears not in acute distress. The patient is well groomed. Head: Normocephalic, atraumatic. Neck is supple. Mallampati 2 ,  neck circumference: 16 5 . Nasal airflow unrestricted , TMJ is not evident . Retrognathia is  seen.  Cardiovascular:  Regular rate and rhythm  without carotid bruit, and without distended neck veins. Respiratory: Lungs are clear to auscultation. Skin:  Without evidence of edema, or rash Trunk: BMI is  elevated and patient  has normal posture.  Neurologic exam : The patient is awake and alert, oriented to place and time.   Memory subjective  described as intact. There is a normal attention span & concentration ability. Speech is fluent without dysarthria, dysphonia or aphasia. Mood and affect are appropriate.  Cranial nerves: Pupils are equal and briskly reactive to light.  Extraocular movements  in vertical and horizontal planes intact and without nystagmus. Hearing to finger rub intact.  Facial sensation intact to fine touch. Facial motor strength is symmetric and  tongue and uvula move midline. Tongue protrudes normal, shoulder shrug is pain restricted.  Motor exam:  Elevated tone , muscle tenderness and failure of relaxation- normal muscle  bulk and symmetric ,strength in all extremities. Pain over the shoulders. Crepitation.  Deep tendon reflexes: in the  upper and lower extremities are symmetric and intact.  Babinski maneuver response is downgoing.   Assessment:  After physical and neurologic examination, review of laboratory studies, imaging, neurophysiology testing and pre-existing records, assessment is  REM accentuated in supine accentuated obstructive sleep apnea with hypoventilation and CO2 retention was found during the sleep study. Since the patient is using CPAP she has also benefited in other health scores. Her diabetes her HbA1c is no at 6.2 better than ever, pressure seems to be better regulated, she has neither nocturia, no wheezing, she is restored and refreshed in the morning she gets more hours of sleep and more of these hours before midnight. He feels a lot more alert and has an easier time concentrating or multitasking.  Plan:  Treatment plan and additional workup : 12 month compliance visit CPAP.  Asencion Partridge Dewaine Morocho MD  01/17/2015

## 2015-02-03 DIAGNOSIS — E780 Pure hypercholesterolemia: Secondary | ICD-10-CM | POA: Diagnosis not present

## 2015-02-03 DIAGNOSIS — E1165 Type 2 diabetes mellitus with hyperglycemia: Secondary | ICD-10-CM | POA: Diagnosis not present

## 2015-02-03 DIAGNOSIS — E039 Hypothyroidism, unspecified: Secondary | ICD-10-CM | POA: Diagnosis not present

## 2015-02-13 DIAGNOSIS — E039 Hypothyroidism, unspecified: Secondary | ICD-10-CM | POA: Diagnosis not present

## 2015-02-13 DIAGNOSIS — E780 Pure hypercholesterolemia: Secondary | ICD-10-CM | POA: Diagnosis not present

## 2015-02-13 DIAGNOSIS — I10 Essential (primary) hypertension: Secondary | ICD-10-CM | POA: Diagnosis not present

## 2015-02-13 DIAGNOSIS — E1165 Type 2 diabetes mellitus with hyperglycemia: Secondary | ICD-10-CM | POA: Diagnosis not present

## 2015-03-10 ENCOUNTER — Other Ambulatory Visit: Payer: Self-pay | Admitting: Emergency Medicine

## 2015-03-24 DIAGNOSIS — R921 Mammographic calcification found on diagnostic imaging of breast: Secondary | ICD-10-CM | POA: Diagnosis not present

## 2015-03-25 ENCOUNTER — Ambulatory Visit (INDEPENDENT_AMBULATORY_CARE_PROVIDER_SITE_OTHER): Payer: Medicare Other | Admitting: Emergency Medicine

## 2015-03-25 ENCOUNTER — Encounter: Payer: Self-pay | Admitting: Emergency Medicine

## 2015-03-25 VITALS — BP 164/72 | HR 74 | Temp 98.5°F | Resp 16 | Ht 63.0 in | Wt 172.0 lb

## 2015-03-25 DIAGNOSIS — IMO0001 Reserved for inherently not codable concepts without codable children: Secondary | ICD-10-CM

## 2015-03-25 DIAGNOSIS — Z794 Long term (current) use of insulin: Secondary | ICD-10-CM | POA: Diagnosis not present

## 2015-03-25 DIAGNOSIS — I10 Essential (primary) hypertension: Secondary | ICD-10-CM | POA: Diagnosis not present

## 2015-03-25 DIAGNOSIS — E785 Hyperlipidemia, unspecified: Secondary | ICD-10-CM | POA: Diagnosis not present

## 2015-03-25 DIAGNOSIS — Z23 Encounter for immunization: Secondary | ICD-10-CM

## 2015-03-25 DIAGNOSIS — E079 Disorder of thyroid, unspecified: Secondary | ICD-10-CM

## 2015-03-25 DIAGNOSIS — E119 Type 2 diabetes mellitus without complications: Secondary | ICD-10-CM

## 2015-03-25 DIAGNOSIS — M810 Age-related osteoporosis without current pathological fracture: Secondary | ICD-10-CM

## 2015-03-25 LAB — BASIC METABOLIC PANEL WITH GFR
BUN: 9 mg/dL (ref 6–23)
CO2: 26 meq/L (ref 19–32)
Calcium: 9 mg/dL (ref 8.4–10.5)
Chloride: 106 mEq/L (ref 96–112)
Creat: 0.6 mg/dL (ref 0.50–1.10)
GLUCOSE: 100 mg/dL — AB (ref 70–99)
Potassium: 3.8 mEq/L (ref 3.5–5.3)
SODIUM: 143 meq/L (ref 135–145)

## 2015-03-25 LAB — LIPID PANEL
Cholesterol: 163 mg/dL (ref 0–200)
HDL: 41 mg/dL — ABNORMAL LOW (ref >=46)
LDL Cholesterol: 95 mg/dL (ref 0–99)
TRIGLYCERIDES: 137 mg/dL (ref <150)
Total CHOL/HDL Ratio: 4 Ratio
VLDL: 27 mg/dL (ref 0–40)

## 2015-03-25 LAB — TSH: TSH: 0.407 u[IU]/mL (ref 0.350–4.500)

## 2015-03-25 MED ORDER — BETAMETHASONE DIPROPIONATE AUG 0.05 % EX OINT: TOPICAL_OINTMENT | Freq: Two times a day (BID) | CUTANEOUS | Status: DC

## 2015-03-25 MED ORDER — LEVOTHYROXINE SODIUM 125 MCG PO TABS: 125.0000 ug | ORAL_TABLET | Freq: Every day | ORAL | Status: DC

## 2015-03-25 MED ORDER — GABAPENTIN 100 MG PO CAPS: ORAL_CAPSULE | ORAL | Status: DC

## 2015-03-25 MED ORDER — ZOSTER VACCINE LIVE 19400 UNT/0.65ML ~~LOC~~ SOLR: 0.6500 mL | Freq: Once | SUBCUTANEOUS | Status: DC

## 2015-03-25 MED ORDER — TRETINOIN-CLEANSER-MOISTURIZER 0.025 % CREAM EX KIT: PACK | CUTANEOUS | Status: DC

## 2015-03-25 MED ORDER — LOSARTAN POTASSIUM-HCTZ 100-25 MG PO TABS: 1.0000 | ORAL_TABLET | Freq: Every day | ORAL | Status: DC

## 2015-03-25 MED ORDER — VITAMIN D (ERGOCALCIFEROL) 1.25 MG (50000 UNIT) PO CAPS: 50000.0000 [IU] | ORAL_CAPSULE | ORAL | Status: DC

## 2015-03-25 NOTE — Progress Notes (Signed)
   Subjective:  This chart was scribed for Ashley ChrisSteven Raoul Ciano, MD by Broadus Johnawaa Al Rifaie, Medical Scribe. This patient was seen in Room 23 and the patient's care was started at 11:11 AM.   Patient ID: Ashley MalesKathryn B Piechota, female    DOB: [birth year], 64 y.o.   MRN: [id number]  HPI HPI Comments: [name] is a 64 y.o. female with a PMHx of DM who presents to [facility] for a 3 month follow up.  Pt notes that everything is going well. She indicates that her diabetes is under control. Pt reports that her blood pressure is elevated due to certain life events such as her daughter actions.   Pt notes that she had an episode of shingles a year ago. She shows interest in getting a shingles vaccine.   Pt's cardiologist is Dr. Jacinto HalimGanji. Pt notes that she sees him once a year.   Pt's daughter, [name], is currently 66 years old. She plans to study nursing at [employer] in the future.    Review of Systems     Objective:   Physical Exam  Constitutional: She is oriented to person, place, and time. She appears well-developed and well-nourished. No distress.  HENT:  Head: Normocephalic and atraumatic.  Eyes: EOM are normal. Pupils are equal, round, and reactive to light.  Neck: Neck supple.  Cardiovascular: Normal rate.   Faint left carotid bruit  Pulmonary/Chest: Effort normal.  Neurological: She is alert and oriented to person, place, and time. No cranial nerve deficit.  Skin: Skin is warm and dry.  Psychiatric: She has a normal mood and affect. Her behavior is normal.  Nursing note and vitals reviewed.     Assessment & Plan:  1. Insulin dependent diabetes mellitus This is managed by Dr.Balan  2. Essential hypertension She is not a goal. She did not take her medication this morning. She is going to monitor this at home - BASIC METABOLIC PANEL WITH GFR  3. Need for shingles vaccine  - zoster vaccine live, PF, (ZOSTAVAX) [id number] UNT/0.65ML injection; Inject 19,400 Units into the skin  once.  Dispense: 1 each; Refill: 0  4. Thyroid disease  - TSH  5. Hyperlipidemia  - Lipid panel  6. Osteoporosis  - DG Bone Density; Future   I personally performed the services described in this documentation, which was scribed in my presence. The recorded information has been reviewed and is accurate.  Ashley ChrisSteven Bright Spielmann, MD  [facility], [facility]  03/25/2015 12:56 PM

## 2015-03-27 ENCOUNTER — Other Ambulatory Visit: Payer: Self-pay

## 2015-03-27 DIAGNOSIS — E2839 Other primary ovarian failure: Secondary | ICD-10-CM

## 2015-04-02 ENCOUNTER — Encounter: Payer: Self-pay | Admitting: Emergency Medicine

## 2015-04-21 ENCOUNTER — Inpatient Hospital Stay: Admission: RE | Admit: 2015-04-21 | Payer: No Typology Code available for payment source | Source: Ambulatory Visit

## 2015-04-28 ENCOUNTER — Ambulatory Visit
Admission: RE | Admit: 2015-04-28 | Discharge: 2015-04-28 | Disposition: A | Discharge status: Home / Self Care | Payer: Medicare Other | Source: Ambulatory Visit | Attending: Emergency Medicine | Admitting: Emergency Medicine

## 2015-04-28 DIAGNOSIS — E2839 Other primary ovarian failure: Secondary | ICD-10-CM

## 2015-04-28 DIAGNOSIS — Z780 Asymptomatic menopausal state: Secondary | ICD-10-CM | POA: Diagnosis not present

## 2015-04-28 DIAGNOSIS — Z13820 Encounter for screening for osteoporosis: Secondary | ICD-10-CM | POA: Diagnosis not present

## 2015-04-28 LAB — HM DEXA SCAN

## 2015-04-29 ENCOUNTER — Encounter: Payer: Self-pay | Admitting: Emergency Medicine

## 2015-04-29 DIAGNOSIS — R0989 Other specified symptoms and signs involving the circulatory and respiratory systems: Secondary | ICD-10-CM | POA: Diagnosis not present

## 2015-05-03 ENCOUNTER — Other Ambulatory Visit: Payer: Self-pay | Admitting: Emergency Medicine

## 2015-05-06 NOTE — Telephone Encounter (Signed)
Faxed

## 2015-05-09 ENCOUNTER — Encounter: Payer: Self-pay | Admitting: Emergency Medicine

## 2015-05-09 DIAGNOSIS — R0989 Other specified symptoms and signs involving the circulatory and respiratory systems: Secondary | ICD-10-CM | POA: Diagnosis not present

## 2015-05-09 DIAGNOSIS — R0602 Shortness of breath: Secondary | ICD-10-CM | POA: Diagnosis not present

## 2015-05-09 DIAGNOSIS — Q231 Congenital insufficiency of aortic valve: Secondary | ICD-10-CM | POA: Diagnosis not present

## 2015-05-09 DIAGNOSIS — I700 Atherosclerosis of aorta: Secondary | ICD-10-CM | POA: Diagnosis not present

## 2015-06-17 ENCOUNTER — Encounter: Payer: Self-pay | Admitting: Emergency Medicine

## 2015-07-04 DIAGNOSIS — Z9641 Presence of insulin pump (external) (internal): Secondary | ICD-10-CM | POA: Diagnosis not present

## 2015-07-04 DIAGNOSIS — I10 Essential (primary) hypertension: Secondary | ICD-10-CM | POA: Diagnosis not present

## 2015-07-04 DIAGNOSIS — E039 Hypothyroidism, unspecified: Secondary | ICD-10-CM | POA: Diagnosis not present

## 2015-07-04 DIAGNOSIS — E109 Type 1 diabetes mellitus without complications: Secondary | ICD-10-CM | POA: Diagnosis not present

## 2015-07-14 ENCOUNTER — Other Ambulatory Visit: Payer: Self-pay | Admitting: Emergency Medicine

## 2015-07-15 NOTE — Telephone Encounter (Signed)
Refilled in Dr. Ellis Parentsaub's absence. Please have patient f/u with him.

## 2015-07-16 NOTE — Telephone Encounter (Signed)
Rx faxed

## 2015-08-28 ENCOUNTER — Ambulatory Visit (INDEPENDENT_AMBULATORY_CARE_PROVIDER_SITE_OTHER): Payer: Medicare Other | Admitting: Emergency Medicine

## 2015-08-28 ENCOUNTER — Encounter: Payer: Self-pay | Admitting: Emergency Medicine

## 2015-08-28 VITALS — BP 183/68 | HR 69 | Temp 98.5°F | Resp 16 | Ht 63.5 in | Wt 171.8 lb

## 2015-08-28 DIAGNOSIS — E119 Type 2 diabetes mellitus without complications: Secondary | ICD-10-CM

## 2015-08-28 DIAGNOSIS — IMO0001 Reserved for inherently not codable concepts without codable children: Secondary | ICD-10-CM

## 2015-08-28 DIAGNOSIS — Z794 Long term (current) use of insulin: Secondary | ICD-10-CM | POA: Diagnosis not present

## 2015-08-28 DIAGNOSIS — M8580 Other specified disorders of bone density and structure, unspecified site: Secondary | ICD-10-CM

## 2015-08-28 DIAGNOSIS — Z23 Encounter for immunization: Secondary | ICD-10-CM | POA: Diagnosis not present

## 2015-08-28 DIAGNOSIS — M859 Disorder of bone density and structure, unspecified: Secondary | ICD-10-CM

## 2015-08-28 DIAGNOSIS — E079 Disorder of thyroid, unspecified: Secondary | ICD-10-CM | POA: Diagnosis not present

## 2015-08-28 DIAGNOSIS — I10 Essential (primary) hypertension: Secondary | ICD-10-CM | POA: Diagnosis not present

## 2015-08-28 DIAGNOSIS — E785 Hyperlipidemia, unspecified: Secondary | ICD-10-CM | POA: Diagnosis not present

## 2015-08-28 LAB — CBC WITH DIFFERENTIAL/PLATELET
Basophils Absolute: 0.1 10*3/uL (ref 0.0–0.1)
Basophils Relative: 1 % (ref 0–1)
EOS ABS: 0.1 10*3/uL (ref 0.0–0.7)
EOS PCT: 1 % (ref 0–5)
HEMATOCRIT: 44.5 % (ref 36.0–46.0)
Hemoglobin: 14.8 g/dL (ref 12.0–15.0)
LYMPHS ABS: 2.2 10*3/uL (ref 0.7–4.0)
LYMPHS PCT: 33 % (ref 12–46)
MCH: 27.4 pg (ref 26.0–34.0)
MCHC: 33.3 g/dL (ref 30.0–36.0)
MCV: 82.4 fL (ref 78.0–100.0)
MONO ABS: 0.3 10*3/uL (ref 0.1–1.0)
MONOS PCT: 5 % (ref 3–12)
MPV: 8.7 fL (ref 8.6–12.4)
Neutro Abs: 4 10*3/uL (ref 1.7–7.7)
Neutrophils Relative %: 60 % (ref 43–77)
PLATELETS: 224 10*3/uL (ref 150–400)
RBC: 5.4 MIL/uL — ABNORMAL HIGH (ref 3.87–5.11)
RDW: 14.8 % (ref 11.5–15.5)
WBC: 6.6 10*3/uL (ref 4.0–10.5)

## 2015-08-28 LAB — COMPLETE METABOLIC PANEL WITH GFR
ALT: 22 U/L (ref 6–29)
AST: 24 U/L (ref 10–35)
Albumin: 4.2 g/dL (ref 3.6–5.1)
Alkaline Phosphatase: 59 U/L (ref 33–130)
BUN: 8 mg/dL (ref 7–25)
CALCIUM: 9.5 mg/dL (ref 8.6–10.4)
CHLORIDE: 102 mmol/L (ref 98–110)
CO2: 28 mmol/L (ref 20–31)
CREATININE: 0.63 mg/dL (ref 0.50–0.99)
Glucose, Bld: 98 mg/dL (ref 65–99)
Potassium: 3.8 mmol/L (ref 3.5–5.3)
Sodium: 142 mmol/L (ref 135–146)
Total Bilirubin: 0.5 mg/dL (ref 0.2–1.2)
Total Protein: 7.4 g/dL (ref 6.1–8.1)

## 2015-08-28 LAB — LIPID PANEL
CHOLESTEROL: 179 mg/dL (ref 125–200)
HDL: 47 mg/dL (ref >=46)
LDL CALC: 104 mg/dL (ref <130)
TRIGLYCERIDES: 139 mg/dL (ref <150)
Total CHOL/HDL Ratio: 3.8 Ratio (ref <=5.0)
VLDL: 28 mg/dL (ref <30)

## 2015-08-28 LAB — TSH: TSH: 1.658 u[IU]/mL (ref 0.350–4.500)

## 2015-08-28 MED ORDER — AMLODIPINE BESYLATE 10 MG PO TABS: ORAL_TABLET | ORAL | Status: DC

## 2015-08-28 NOTE — Patient Instructions (Signed)
Please take your blood pressure every day. In 2 weeks please send a mild chart message. Have a great holiday.

## 2015-08-28 NOTE — Progress Notes (Signed)
Subjective:  This chart was scribed for Darlyne Russian, MD by Tamsen Roers, at [facility].  This patient was seen in room 22 and the patient's care was started at 10:26 AM.    Patient ID: [name], female    DOB: 06-16-1951, 64 y.o.   MRN: [id number] Chief Complaint  Patient presents with  . Follow-up    5 months diabetes    HPI  HPI Comments: [name] is a 64 y.o. female who presents to the [facility] for a follow up regarding her diabetes. She states it has been ranging around 140's.  She is currently seeing Dr. Chalmers Cater every 90 days.  She denies any issues with neuropathy.  She is complaint with using her inhaler (1/ per week). Patient has a CPAP machine which she uses and sees Dr. Einar Gip (her cardiologist) once a year.   Blood pressure: She has a blood pressure cuff at home but has not been checking it.  She states it is up today because of her daughter at home. Right arm: 200/86.  She has been compliant with her blood pressure medication.   Patient received her flu shot today.   Home life: Patients daughter at home is being home schooled.  She is planning on sending her off to college soon.    Patient Active Problem List   Diagnosis Date Noted  . History of nocturia 01/17/2015  . OSA on CPAP 01/17/2015  . Asthma, chronic 09/25/2014  . Nocturia more than twice per night 09/25/2014  . Diabetic neuropathy, type II diabetes mellitus ([place]) 09/25/2014  . Primary snoring 09/25/2014  . Myalgia 09/25/2014  . Diabetes mellitus   . Goiter   . Depression   . Asthma   . SVT (supraventricular tachycardia) ([place])   . Vasculitis ([place])   . GERD (gastroesophageal reflux disease)   . FIBROMYALGIA 07/14/2009  . PROTEINURIA, MILD 07/14/2009  . DIABETIC PERIPHERAL NEUROPATHY 12/04/2007  . DYSLIPIDEMIA 12/04/2007  . Asthma 12/04/2007  . SUPRAVENTRICULAR TACHYCARDIA, HX OF 12/04/2007  . DIVERTICULOSIS, COLON 07/12/2007  .  HYPOTHYROIDISM 04/06/2007  . DIABETES MELLITUS, TYPE I 04/06/2007  . ANXIETY 04/06/2007  . DEPRESSION 04/06/2007   Past Medical History  Diagnosis Date  . Diabetes mellitus   . Other and unspecified hyperlipidemia   . Menopause   . Goiter   . Depression   . Asthma   . Hair loss   . SVT (supraventricular tachycardia) ([place])   . Vasculitis ([place])   . GERD (gastroesophageal reflux disease)   . Positive PPD   . Hypertension   . Depression   . Anxiety   . Fibromyalgia    Past Surgical History  Procedure Laterality Date  . Appendectomy    . Partial hysterectomy    . Cholecystectomy     Allergies  Allergen Reactions  . Penicillins Anaphylaxis  . Ace Inhibitors     INDUCED BRONCHOSPASM   Prior to Admission medications   Medication Sig Start Date End Date Taking? Authorizing Provider  albuterol (PROVENTIL HFA;VENTOLIN HFA) 108 (90 BASE) MCG/ACT inhaler Inhale 2 puffs into the lungs every 6 (six) hours as needed. 09/19/14   Darlyne Russian, MD  albuterol (PROVENTIL) (2.5 MG/3ML) 0.083% nebulizer solution Take 3 mLs (2.5 mg total) by nebulization every 6 (six) hours as needed. 09/19/14   Darlyne Russian, MD  ALPRAZolam Duanne Moron) 1 MG tablet TAKE ONE-HALF TABLET BY MOUTH ONCE DAILY AS NEEDED FOR  STRESS 07/15/15  Jaynee Eagles, PA-C  amLODipine (NORVASC) 5 MG tablet TAKE ONE TABLET BY MOUTH ONCE DAILY 12/27/14   Darlyne Russian, MD  aspirin 81 MG tablet Take 81 mg by mouth daily.    Historical Provider, MD  augmented betamethasone dipropionate (DIPROLENE-AF) 0.05 % ointment Apply topically 2 (two) times daily. 03/25/15   Darlyne Russian, MD  BIOTIN PO Take 10,000 mcg by mouth daily.     Historical Provider, MD  cetirizine (ZYRTEC) 10 MG tablet Take 10 mg by mouth daily.    Historical Provider, MD  Dextrose, Diabetic Use, (GLUCOSE PO) Take by mouth as needed.     Historical Provider, MD  gabapentin (NEURONTIN) 100 MG capsule TAKE THREE CAPSULES BY MOUTH IN THE MORNING AND SIX CAPSULES AT NIGHT 03/25/15    Darlyne Russian, MD  HUMALOG 100 UNIT/ML injection  12/12/14   Historical Provider, MD  levothyroxine (SYNTHROID, LEVOTHROID) 125 MCG tablet Take 1 tablet (125 mcg total) by mouth daily. 03/25/15   Darlyne Russian, MD  losartan-hydrochlorothiazide (HYZAAR) 100-25 MG per tablet Take 1 tablet by mouth daily. 03/25/15   Darlyne Russian, MD  montelukast (SINGULAIR) 10 MG tablet TAKE ONE TABLET BY MOUTH ONCE DAILY AT BEDTIME 11/08/14   Darlyne Russian, MD  Multiple Vitamins-Minerals (MULTIVITAMIN WITH MINERALS) tablet Take 1 tablet by mouth daily.    Historical Provider, MD  naproxen sodium (ANAPROX) 220 MG tablet Take 220 mg by mouth as needed.    Historical Provider, MD  potassium chloride SA (K-DUR,KLOR-CON) 20 MEQ tablet Take 20 mEq by mouth 2 (two) times daily.    Historical Provider, MD  Probiotic Product (PROBIOTIC DAILY PO) Take by mouth.    Historical Provider, MD  rosuvastatin (CRESTOR) 20 MG tablet Take 1 tablet (20 mg total) by mouth daily. 12/20/14   Darlyne Russian, MD  sertraline (ZOLOFT) 50 MG tablet TAKE ONE TABLET BY MOUTH ONCE DAILY 07/15/15   Shawnee Knapp, MD  Tretinoin-Cleanser-Moisturizer 0.025 % CREAM KIT Apply once daily as instructed 03/25/15   Darlyne Russian, MD  Vitamin D, Ergocalciferol, (DRISDOL) 50000 UNITS CAPS capsule Take 1 capsule (50,000 Units total) by mouth once a week. 03/25/15   Darlyne Russian, MD  zolpidem (AMBIEN) 10 MG tablet TAKE ONE-HALF TABLET BY MOUTH AT BEDTIME AS NEEDED 06/10/14   Darlyne Russian, MD  zoster vaccine live, PF, (ZOSTAVAX) 39030 UNT/0.65ML injection Inject 19,400 Units into the skin once. 03/25/15   Darlyne Russian, MD   Social History   Social History  . Marital Status: Married    Spouse Name: N/A  . Number of Children: N/A  . Years of Education: N/A   Occupational History  . Not on file.   Social History Main Topics  . Smoking status: Former Smoker -- 15 years    Types: Cigarettes    Quit date: 03/13/1988  . Smokeless tobacco: Not on file  . Alcohol Use:  No  . Drug Use: No  . Sexual Activity: Not on file   Other Topics Concern  . Not on file   Social History Narrative   Caffeine 1-2 cups coffee daily.   Lives at home with husband and daughter.   Retired.   Associates degree.   Has 4 kids.     Review of Systems     Objective:   Physical Exam  Filed Vitals:   08/28/15 1014  BP: 183/68  Pulse: 69  Temp: 98.5 F (36.9 C)  TempSrc: Oral  Resp:  16  Height: 5' 3.5" (1.613 m)  Weight: 171 lb 12.8 oz (77.928 kg)  SpO2: 95%     CONSTITUTIONAL: Well developed/well nourished HEAD: Normocephalic/atraumatic EYES: EOMI/PERRL SPINE/BACK:entire spine nontender CV: Grade 1 systolic murmur left sternal border LUNGS: no apparent distress NEURO: Pt is awake/alert/appropriate, moves all extremitiesx4.  No facial droop.   EXTREMITIES: Decreased sensation in her feet secondary to her neuropathy.  SKIN: warm, color normal PSYCH: no abnormalities of mood noted, alert and oriented to situation         Assessment & Plan:  Patient looks good. I'm very concerned about her pressure. I increased her amlodipine to 10 mg a day. She will take daily blood pressure reading and in 2 weeks send that to me by my chart. She was given a flu vaccine today. She will continue follow-ups with Dr. Suzette Battiest.I personally performed the services described in this documentation, which was scribed in my presence. The recorded information has been reviewed and is accurate.

## 2015-09-13 ENCOUNTER — Other Ambulatory Visit: Payer: Self-pay | Admitting: Urgent Care

## 2015-09-13 ENCOUNTER — Other Ambulatory Visit: Payer: Self-pay | Admitting: Family Medicine

## 2015-09-14 NOTE — Telephone Encounter (Signed)
Patient needs to be seen by Dr. Cleta Albertsaub for controlled substance refills. [facility]. I will provide one more refill for this but follow up has to happen with Dr. Cleta Albertsaub due to the increased risks associated with using this medication within her age group.

## 2015-09-15 ENCOUNTER — Encounter: Payer: Self-pay | Admitting: Emergency Medicine

## 2015-09-15 NOTE — Telephone Encounter (Signed)
Rx faxed

## 2015-09-15 NOTE — Telephone Encounter (Signed)
Dr Cleta Albertsaub, you just saw pt, but don't see depression addressed. OK to give RFs until her appt in June?

## 2015-09-19 ENCOUNTER — Other Ambulatory Visit: Payer: Self-pay | Admitting: Emergency Medicine

## 2015-09-19 DIAGNOSIS — I10 Essential (primary) hypertension: Secondary | ICD-10-CM

## 2015-09-19 MED ORDER — VALSARTAN-HYDROCHLOROTHIAZIDE 320-12.5 MG PO TABS: 1.0000 | ORAL_TABLET | Freq: Every day | ORAL | Status: DC

## 2015-09-21 ENCOUNTER — Encounter: Payer: Self-pay | Admitting: Emergency Medicine

## 2015-09-28 ENCOUNTER — Telehealth: Payer: Self-pay | Admitting: Family Medicine

## 2015-09-28 NOTE — Telephone Encounter (Signed)
lmom to call and reschedule appt with daub 

## 2015-10-03 DIAGNOSIS — R0602 Shortness of breath: Secondary | ICD-10-CM | POA: Diagnosis not present

## 2015-10-03 DIAGNOSIS — G4733 Obstructive sleep apnea (adult) (pediatric): Secondary | ICD-10-CM | POA: Diagnosis not present

## 2015-10-03 DIAGNOSIS — Q231 Congenital insufficiency of aortic valve: Secondary | ICD-10-CM | POA: Diagnosis not present

## 2015-10-03 DIAGNOSIS — I10 Essential (primary) hypertension: Secondary | ICD-10-CM | POA: Diagnosis not present

## 2015-10-06 DIAGNOSIS — E039 Hypothyroidism, unspecified: Secondary | ICD-10-CM | POA: Diagnosis not present

## 2015-10-06 DIAGNOSIS — E7800 Pure hypercholesterolemia, unspecified: Secondary | ICD-10-CM | POA: Diagnosis not present

## 2015-10-06 DIAGNOSIS — I10 Essential (primary) hypertension: Secondary | ICD-10-CM | POA: Diagnosis not present

## 2015-10-06 DIAGNOSIS — E109 Type 1 diabetes mellitus without complications: Secondary | ICD-10-CM | POA: Diagnosis not present

## 2015-11-11 ENCOUNTER — Other Ambulatory Visit: Payer: Self-pay | Admitting: Emergency Medicine

## 2015-11-21 DIAGNOSIS — I10 Essential (primary) hypertension: Secondary | ICD-10-CM | POA: Diagnosis not present

## 2015-11-28 DIAGNOSIS — I10 Essential (primary) hypertension: Secondary | ICD-10-CM | POA: Diagnosis not present

## 2015-12-08 DIAGNOSIS — I10 Essential (primary) hypertension: Secondary | ICD-10-CM | POA: Diagnosis not present

## 2015-12-08 DIAGNOSIS — E7800 Pure hypercholesterolemia, unspecified: Secondary | ICD-10-CM | POA: Diagnosis not present

## 2015-12-08 DIAGNOSIS — E109 Type 1 diabetes mellitus without complications: Secondary | ICD-10-CM | POA: Diagnosis not present

## 2015-12-08 DIAGNOSIS — E039 Hypothyroidism, unspecified: Secondary | ICD-10-CM | POA: Diagnosis not present

## 2015-12-22 ENCOUNTER — Ambulatory Visit
Admission: RE | Admit: 2015-12-22 | Discharge: 2015-12-22 | Disposition: A | Discharge status: Home / Self Care | Payer: Medicare Other | Source: Ambulatory Visit | Attending: Cardiology | Admitting: Cardiology

## 2015-12-22 ENCOUNTER — Other Ambulatory Visit: Payer: Self-pay | Admitting: Cardiology

## 2015-12-22 DIAGNOSIS — R0602 Shortness of breath: Secondary | ICD-10-CM

## 2015-12-22 DIAGNOSIS — I10 Essential (primary) hypertension: Secondary | ICD-10-CM | POA: Diagnosis not present

## 2016-01-05 DIAGNOSIS — I10 Essential (primary) hypertension: Secondary | ICD-10-CM | POA: Diagnosis not present

## 2016-01-13 ENCOUNTER — Encounter: Payer: Self-pay | Admitting: Adult Health

## 2016-01-13 ENCOUNTER — Ambulatory Visit (INDEPENDENT_AMBULATORY_CARE_PROVIDER_SITE_OTHER): Payer: Medicare Other | Admitting: Adult Health

## 2016-01-13 VITALS — BP 104/49 | HR 59 | Ht 63.5 in | Wt 171.6 lb

## 2016-01-13 DIAGNOSIS — G4733 Obstructive sleep apnea (adult) (pediatric): Secondary | ICD-10-CM

## 2016-01-13 DIAGNOSIS — Z9989 Dependence on other enabling machines and devices: Principal | ICD-10-CM

## 2016-01-13 NOTE — Addendum Note (Signed)
Addended by: Geronimo RunningINKINS, Jayziah Bankhead A on: 01/13/2016 02:53 PM   Modules accepted: Orders

## 2016-01-13 NOTE — Patient Instructions (Signed)
Mask refitting with Robin Switch D[facility] to [facility] If your symptoms worsen or you develop new symptoms please let us know.

## 2016-01-13 NOTE — Progress Notes (Signed)
PATIENT: [name] DOB: [birth year]  REASON FOR VISIT: follow up- OSA on CPAP HISTORY FROM: patient  HISTORY OF PRESENT ILLNESS: [name] is a 65 year old female with a history of sleep apnea on CPAP. She returns today for a compliance download. Her download indicates that she uses her machine 30 out of 30 days for compliance of 100%. She uses her machine greater than 4 hours 27 out of 30 days for compliance of 90%. On average she uses her machine 6 hours and 55 minutes. Her residual AHI is 3.7 on 6 cm of water with EPR of 3. She does not have a significant leak. She states that she cannot tolerate the mask that she was given. She has been using the nasal cannulas that her husband use. She states that this is been working well for her. Her Epworth sleepiness score is 1 and fatigue severity score is 36. She also states that she would like to change her DME company to [facility]. She returns today for an evaluation.  HISTORY (DOHMEIER):[name] is a 65 y.o. female seen here as a referral from Dr. Einar Gip and Daub for a sleep evaluation.  [name] is a right-handed, ambi-dexterous, Caucasian female patient of Dr. Willaim Bane. It was him who sent her here today for a sleep evaluation the patient reports that she has been known to snore and that she feels she sleeps deep and sound and heart". She endorses that she has a heart murmur often feels fatigued in daytime and sometimes has wheezing again snoring she has the environmental allergies and some component of restless legs is noted. She also feels often hot cold or has increased thirst and endorsed flushing. Already established our diagnoses of hypertension, diabetes, high cholesterol, heart disease, fibromyalgia, anxiety-depression and the patient has undergone a hysterectomy and cholecystectomy in the past. Dr. Willaim Bane reported that she has aortic and mitral valve leakage, and incomplete right bundle branch block, and an enlarged left  atrium. She was referred to Dr. Michiel Sites recently for uncontrolled diabetes.  The patient usually goes to bed about 10:30 sometimes she will read occasionally she will watch TV but this seems not to be a routine. Her bedroom is described as cool, quiet and dark. The couple sleeps in different bedrooms as a bolus find the partner snoring disturbing. She will wake up up to 4 or 5 times at night from her, some of these are nocturia is but mostly she goes to the bathroom because she is already awake. She will wake up about 7:30 usually without alarm. She estimates that her total sleep time is 6 hours or less overnight. She does not nap in daytime. Due to her condition of fibromyalgia she associates the discomfort often with sleep interruption. When she rises she will take coffee usually she has breakfast. She just is a decided to retire in December 2015. Her previous job was a daytime employment and she was self-employed as a Scientist, forensic and worked at [employer].She has no history of TBI, of skull trauma . She broke her nose once. No nasal septum deviation.   1) [name] is a patient with a chief complaint of nonrestorative sleep, and multiple factors play a role in this. There is a snoring Witnessed apnea -reported by her husband. There is less restful and less sound sleep due to underlying pain and discomfort, attributed to fibromyalgia.  2)There is sleep interruption from nocturia which can be diabetes related but nocturia can also occur  in obstructive sleep apnea. The patient sometimes wakes up with headaches in the morning and she may revealed retain CO2 overnight which would lead to avascular dilation and a global headache but then clears as the day goes on. She is more prone to have this condition because she has a primary pulmonary abnormality with a diagnosis of asthma. 3)Her hypertension may be better controlled if apnea is found and treated. Hypoxemia and hypercapnia can also be treated with  CPAP and often allow a patient continues restful and more sound sleep with less nocturia without a dry mouth and waking up without headaches.  The patient was advised of the nature of the diagnosed sleep disorder , the treatment options and risks for general a health and wellness arising from not treating the condition. Visit duration was 45 minutes.more than 50% of our face to face time was dedicated to the diagnosis discussion, explanation of the choice of tests and which treatments may result.  Interval history: 01-17-15 I have the pleasure of seeing [name] today in a follow-up after her recent adult patient split-night polysomnogram she was recorded on 10-17-14. Her apnea index was 15 and her RDI 16.5. It was remarkable that there was a strong positional component as she had an AHI of 25.7 in supine. REM sleep further accentuated strongly her apnea. Her REM sleep AHI was 60. So in dream sleep and on your backyard the lowest oxygen saturation was 81% was 20 minutes of desaturation time. She also retained CO2 to a level of 52.24 during REM sleep area did she had PLM's with an arousal index of 4.3. Her heart rate however remained in sinus rhythm and regular throughout the night. Based on these results she was titrated to CPAP beginning at 4 and is a goal of 6 cm water she reached 0 apneas per hour.  The patient has now had 30 days to try CPAP and she enjoys the therapy she states that it is safe to her life. She has sounder restful sleep she feels restored in the morning does not have any bathroom breaks during the night, her family her spouse have all noticed that she is no longer snoring. She also told me an interesting anecdotes she was very mad at her little ducks and because she fell that he woke her up for no reason of 5 or 6 times at night it turns out that since she has no longer apnea and sleeps regular and brief regular through the night the ducks and has not woken her. She jokingly referred to  him as her therapy dog. She now sleeps 7 to 8 hours and needs no naps. I have also the pleasure to see a 30 day download of her CPAP compliance today she has used the machine 100% of the time and 93% of the nights over 4 hours. She is highly compliant by Medicare criteria. She uses the machine on average over all days at 7 hours and 31 minutes the set machine pressure is 6 cm water and her AHI is 3.6. I consider any AHI below 5 to be of documented benefit. Based on these results and the good clinical response of the patient I would consider seeing her again in a year for a regular compliance follow-up. I would also like to add that her geriatric depression score was endorsed at 3 points and she is currently on antidepressants. Her Epworth sleepiness score is now at 2 points and her fatigue severity score at 29 points. All  these are desirable levels.    REVIEW OF SYSTEMS: Out of a complete 14 system review of symptoms, the patient complains only of the following symptoms, and all other reviewed systems are negative.  Fatigue, wheezing, shortness of breath, murmur, restless leg, frequent waking, depression, nervous/anxious, and terminal allergies, flushing  ALLERGIES: Allergies  Allergen Reactions  . Penicillins Anaphylaxis  . Ace Inhibitors     INDUCED BRONCHOSPASM    HOME MEDICATIONS: Outpatient Prescriptions Prior to Visit  Medication Sig Dispense Refill  . albuterol (PROVENTIL HFA;VENTOLIN HFA) 108 (90 BASE) MCG/ACT inhaler Inhale 2 puffs into the lungs every 6 (six) hours as needed. 8.5 g 11  . albuterol (PROVENTIL) (2.5 MG/3ML) 0.083% nebulizer solution Take 3 mLs (2.5 mg total) by nebulization every 6 (six) hours as needed. 75 mL 1  . ALPRAZolam (XANAX) 1 MG tablet TAKE ONE-HALF TABLET BY MOUTH ONCE DAILY AS NEEDED FOR STRESS 15 tablet 0  . amLODipine (NORVASC) 10 MG tablet One a day 30 tablet 11  . aspirin 81 MG tablet Take 81 mg by mouth daily.    Marland Kitchen augmented betamethasone dipropionate  (DIPROLENE-AF) 0.05 % ointment Apply topically 2 (two) times daily. 30 g 5  . BIOTIN PO Take 10,000 mcg by mouth daily.     . cetirizine (ZYRTEC) 10 MG tablet Take 10 mg by mouth daily.    Marland Kitchen Dextrose, Diabetic Use, (GLUCOSE PO) Take by mouth as needed.     . gabapentin (NEURONTIN) 100 MG capsule TAKE THREE CAPSULES BY MOUTH IN THE MORNING AND SIX CAPSULES AT NIGHT 270 capsule 11  . HUMALOG 100 UNIT/ML injection Reported on 08/28/2015    . levothyroxine (SYNTHROID, LEVOTHROID) 125 MCG tablet Take 1 tablet (125 mcg total) by mouth daily. 30 tablet 11  . montelukast (SINGULAIR) 10 MG tablet TAKE ONE TABLET BY MOUTH AT BEDTIME 30 tablet 0  . Multiple Vitamins-Minerals (MULTIVITAMIN WITH MINERALS) tablet Take 1 tablet by mouth daily.    . naproxen sodium (ANAPROX) 220 MG tablet Take 220 mg by mouth as needed.    . potassium chloride SA (K-DUR,KLOR-CON) 20 MEQ tablet Take 20 mEq by mouth 2 (two) times daily.    . Probiotic Product (PROBIOTIC DAILY PO) Take by mouth.    . rosuvastatin (CRESTOR) 20 MG tablet Take 1 tablet (20 mg total) by mouth daily. 90 tablet 3  . sertraline (ZOLOFT) 50 MG tablet TAKE ONE TABLET BY MOUTH ONCE DAILY 90 tablet 1  . Tretinoin-Cleanser-Moisturizer 0.025 % CREAM KIT Apply once daily as instructed 1 kit 11  . valsartan-hydrochlorothiazide (DIOVAN HCT) 320-12.5 MG tablet Take 1 tablet by mouth daily. 30 tablet 11  . Vitamin D, Ergocalciferol, (DRISDOL) 50000 UNITS CAPS capsule Take 1 capsule (50,000 Units total) by mouth once a week. 4 capsule 11  . zolpidem (AMBIEN) 10 MG tablet TAKE ONE-HALF TABLET BY MOUTH AT BEDTIME AS NEEDED 30 tablet 0  . zoster vaccine live, PF, (ZOSTAVAX) 28768 UNT/0.65ML injection Inject 19,400 Units into the skin once. 1 each 0   No facility-administered medications prior to visit.    PAST MEDICAL HISTORY: Past Medical History  Diagnosis Date  . Diabetes mellitus   . Other and unspecified hyperlipidemia   . Menopause   . Goiter   .  Depression   . Asthma   . Hair loss   . SVT (supraventricular tachycardia) ([place])   . Vasculitis ([place])   . GERD (gastroesophageal reflux disease)   . Positive PPD   . Hypertension   . Depression   .  Anxiety   . Fibromyalgia     PAST SURGICAL HISTORY: Past Surgical History  Procedure Laterality Date  . Appendectomy    . Partial hysterectomy    . Cholecystectomy      FAMILY HISTORY: Family History  Problem Relation Age of Onset  . Hepatitis Mother 71    died from [place]  . Diabetes Father   . Cancer Brother   . Crohn's disease Brother   . Alcohol abuse Brother     SOCIAL HISTORY: Social History   Social History  . Marital Status: Married    Spouse Name: N/A  . Number of Children: N/A  . Years of Education: N/A   Occupational History  . Not on file.   Social History Main Topics  . Smoking status: Former Smoker -- 15 years    Types: Cigarettes    Quit date: 03/13/1988  . Smokeless tobacco: Not on file  . Alcohol Use: No  . Drug Use: No  . Sexual Activity: Not on file   Other Topics Concern  . Not on file   Social History Narrative   Caffeine 1-2 cups coffee daily.   Lives at home with husband and daughter.   Retired.   Associates degree.   Has 4 kids.      PHYSICAL EXAM  Filed Vitals:   01/13/16 1320  BP: 104/49  Pulse: 59  Height: 5' 3.5" (1.613 m)  Weight: 171 lb 9.6 oz (77.837 kg)   Body mass index is 29.92 kg/(m^2).  Generalized: Well developed, in no acute distress  Neck: Circumference 16 1/4 inches. Mallampati 1+  Neurological examination  Mentation: Alert oriented to time, place, history taking. Follows all commands speech and language fluent Cranial nerve II-XII: Pupils were equal round reactive to light. Extraocular movements were full, visual field were full on confrontational test. Facial sensation and strength were normal. Uvula tongue midline. Head turning and shoulder shrug  were normal and symmetric. Motor: The motor testing  reveals 5 over 5 strength of all 4 extremities. Good symmetric motor tone is noted throughout.  Sensory: Sensory testing is intact to soft touch on all 4 extremities. No evidence of extinction is noted.  Coordination: Cerebellar testing reveals good finger-nose-finger and heel-to-shin bilaterally.  Gait and station: Gait is normal.  Reflexes: Deep tendon reflexes are symmetric and normal bilaterally.   DIAGNOSTIC DATA (LABS, IMAGING, TESTING) - I reviewed patient records, labs, notes, testing and imaging myself where available.  Lab Results  Component Value Date   WBC 6.6 08/28/2015   HGB 14.8 08/28/2015   HCT 44.5 08/28/2015   MCV 82.4 08/28/2015   PLT 224 08/28/2015      Component Value Date/Time   NA 142 08/28/2015 1050   K 3.8 08/28/2015 1050   CL 102 08/28/2015 1050   CO2 28 08/28/2015 1050   GLUCOSE 98 08/28/2015 1050   BUN 8 08/28/2015 1050   CREATININE 0.63 08/28/2015 1050   CREATININE 0.53 10/15/2009 0350   CALCIUM 9.5 08/28/2015 1050   PROT 7.4 08/28/2015 1050   ALBUMIN 4.2 08/28/2015 1050   AST 24 08/28/2015 1050   ALT 22 08/28/2015 1050   ALKPHOS 59 08/28/2015 1050   BILITOT 0.5 08/28/2015 1050   GFRNONAA >89 08/28/2015 1050   GFRNONAA >60 10/15/2009 0350   GFRAA >89 08/28/2015 1050   GFRAA  10/15/2009 0350    >60        The eGFR has been calculated using the MDRD equation. This calculation has not  been validated in all clinical situations. eGFR's persistently <60 mL/min signify possible Chronic Kidney Disease.   Lab Results  Component Value Date   CHOL 179 08/28/2015   HDL 47 08/28/2015   LDLCALC 104 08/28/2015   TRIG 139 08/28/2015   CHOLHDL 3.8 08/28/2015   Lab Results  Component Value Date   HGBA1C 6.7 12/20/2014    Lab Results  Component Value Date   TSH 1.658 08/28/2015      ASSESSMENT AND PLAN 65 y.o. year old female  has a past medical history of Diabetes mellitus; Other and unspecified hyperlipidemia; Menopause; Goiter;  Depression; Asthma; Hair loss; SVT (supraventricular tachycardia) ([place]); Vasculitis ([place]); GERD (gastroesophageal reflux disease); Positive PPD; Hypertension; Depression; Anxiety; and Fibromyalgia. here with:  1. Obstructive sleep apnea on CPAP  Overall the patient is doing well. She will continue on CPAP. I will have her refitted for a new mask today with Robin in the sleep lab. We will also change her DME company to [employer]. Patient advised that if her symptoms worsen or she develops any new symptoms she should let us know. She will follow-up in one year or sooner if needed.  Ward Givens, MSN, NP-C 01/13/2016, 1:32 PM [facility] [address] [place], [place] [address] [phone]

## 2016-01-13 NOTE — Progress Notes (Signed)
I agree with the assessment and plan as directed by NP .The patient is known to me .   Brytani Voth, MD  

## 2016-01-19 ENCOUNTER — Encounter: Payer: Self-pay | Admitting: *Deleted

## 2016-01-19 DIAGNOSIS — R0602 Shortness of breath: Secondary | ICD-10-CM | POA: Diagnosis not present

## 2016-01-19 DIAGNOSIS — I10 Essential (primary) hypertension: Secondary | ICD-10-CM | POA: Diagnosis not present

## 2016-01-19 DIAGNOSIS — Z794 Long term (current) use of insulin: Secondary | ICD-10-CM | POA: Diagnosis not present

## 2016-01-19 DIAGNOSIS — E1165 Type 2 diabetes mellitus with hyperglycemia: Secondary | ICD-10-CM | POA: Diagnosis not present

## 2016-02-16 ENCOUNTER — Other Ambulatory Visit: Payer: Self-pay | Admitting: Urgent Care

## 2016-02-18 NOTE — Telephone Encounter (Signed)
Called in.

## 2016-02-24 ENCOUNTER — Ambulatory Visit: Payer: Medicare Other | Admitting: Emergency Medicine

## 2016-03-04 ENCOUNTER — Encounter: Payer: Self-pay | Admitting: Emergency Medicine

## 2016-03-04 ENCOUNTER — Ambulatory Visit (INDEPENDENT_AMBULATORY_CARE_PROVIDER_SITE_OTHER): Payer: Medicare Other | Admitting: Emergency Medicine

## 2016-03-04 VITALS — BP 142/60 | HR 72 | Temp 98.1°F | Resp 16 | Ht 63.25 in | Wt 175.8 lb

## 2016-03-04 DIAGNOSIS — Z794 Long term (current) use of insulin: Secondary | ICD-10-CM

## 2016-03-04 DIAGNOSIS — T2220XA Burn of second degree of shoulder and upper limb, except wrist and hand, unspecified site, initial encounter: Secondary | ICD-10-CM | POA: Diagnosis not present

## 2016-03-04 DIAGNOSIS — IMO0001 Reserved for inherently not codable concepts without codable children: Secondary | ICD-10-CM

## 2016-03-04 DIAGNOSIS — Z658 Other specified problems related to psychosocial circumstances: Secondary | ICD-10-CM | POA: Diagnosis not present

## 2016-03-04 DIAGNOSIS — E119 Type 2 diabetes mellitus without complications: Secondary | ICD-10-CM | POA: Diagnosis not present

## 2016-03-04 DIAGNOSIS — E785 Hyperlipidemia, unspecified: Secondary | ICD-10-CM | POA: Diagnosis not present

## 2016-03-04 DIAGNOSIS — F439 Reaction to severe stress, unspecified: Secondary | ICD-10-CM

## 2016-03-04 DIAGNOSIS — E079 Disorder of thyroid, unspecified: Secondary | ICD-10-CM | POA: Diagnosis not present

## 2016-03-04 DIAGNOSIS — I10 Essential (primary) hypertension: Secondary | ICD-10-CM

## 2016-03-04 MED ORDER — ATENOLOL 25 MG PO TABS
25.0000 mg | ORAL_TABLET | Freq: Two times a day (BID) | ORAL | Status: DC
Start: 2016-03-04 — End: 2018-12-11

## 2016-03-04 MED ORDER — LEVOTHYROXINE SODIUM 125 MCG PO TABS: 125.0000 ug | ORAL_TABLET | Freq: Every day | ORAL | Status: DC

## 2016-03-04 MED ORDER — SERTRALINE HCL 50 MG PO TABS: 50.0000 mg | ORAL_TABLET | Freq: Every day | ORAL | Status: DC

## 2016-03-04 MED ORDER — POTASSIUM CHLORIDE CRYS ER 20 MEQ PO TBCR: 20.0000 meq | EXTENDED_RELEASE_TABLET | Freq: Two times a day (BID) | ORAL | Status: DC

## 2016-03-04 MED ORDER — AMLODIPINE BESYLATE 10 MG PO TABS: ORAL_TABLET | ORAL | Status: DC

## 2016-03-04 MED ORDER — GABAPENTIN 100 MG PO CAPS: ORAL_CAPSULE | ORAL | Status: DC

## 2016-03-04 MED ORDER — ATORVASTATIN CALCIUM 40 MG PO TABS: 40.0000 mg | ORAL_TABLET | Freq: Every day | ORAL | Status: DC

## 2016-03-04 MED ORDER — SILVER SULFADIAZINE 1 % EX CREA: 1.0000 "application " | TOPICAL_CREAM | Freq: Every day | CUTANEOUS | Status: DC

## 2016-03-04 MED ORDER — ALPRAZOLAM 1 MG PO TABS: ORAL_TABLET | ORAL | Status: DC

## 2016-03-04 NOTE — Progress Notes (Signed)
By signing my name below, I, [name], attest that this documentation has been prepared under the direction and in the presence of Ashley Queen, MD. Electronically Signed: Moises [name], [place]. 03/04/2016 , 11:08 AM .  Patient was seen in room 22 .  Chief Complaint:  Chief Complaint  Patient presents with  . Follow-up    6 MONTH - diabetes and [name] pressure  . Medication Refill    Tretinoin Cream 0.025% and K-Dur    HPI: [name] is a 65 y.o. female who reports to [facility] today for follow up on diabetes and [name] pressure. Patient's followed by endocrinologist, Dr. Chalmers Cater, and cardiologist, Dr. Einar Gip. Dr. Einar Gip prescribed her atenolol 43m and she's been out of this medication for about 10-14 days. She also requested medication refill of her tretinoin cream and potassium. She had a potassium level done on 01/05/16 which was 4.5.   She accidentally dropped her curling iron on her right arm this morning. She's applied polysporin and ice over the area. She has a throbbing discomfort over the burned area.   She is scheduled to see her eye doctor in July. Her mammogram is up to date and colonoscopy due next year.   She's been feeling stressed with her daughter, [name] who has bipolar disorder and has been "acting as if she hit puberty again". Her daughter refuses to take her medications.   Past Medical History  Diagnosis Date  . Diabetes mellitus   . Other and unspecified hyperlipidemia   . Menopause   . Goiter   . Depression   . Asthma   . Hair loss   . SVT (supraventricular tachycardia) ([place]   . [place]   . GERD (gastroesophageal reflux disease)   . Positive PPD   . Hypertension   . Depression   . Anxiety   . Fibromyalgia    Past Surgical History  Procedure Laterality Date  . Appendectomy    . Partial hysterectomy    . Cholecystectomy     Social History   Social History  . Marital Status: Married    Spouse Name: N/A  . Number of Children:  N/A  . Years of Education: N/A   Social History Main Topics  . Smoking status: Former Smoker -- 15 years    Types: Cigarettes    Quit date: 03/13/1988  . Smokeless tobacco: None  . Alcohol Use: No  . Drug Use: No  . Sexual Activity: Not Asked   Other Topics Concern  . None   Social History Narrative   Caffeine 1-2 cups coffee daily.   Lives at home with husband and daughter.   Retired.   Associates degree.   Has 4 kids.   Family History  Problem Relation Age of Onset  . Hepatitis Mother 444   [place] . Diabetes Father   . Cancer Brother   . Crohn's disease Brother   . Alcohol abuse Brother    Allergies  Allergen Reactions  . Penicillins Anaphylaxis  . Ace Inhibitors     INDUCED BRONCHOSPASM   Prior to Admission medications   Medication Sig Start Date End Date Taking? Authorizing Provider  albuterol (PROVENTIL HFA;VENTOLIN HFA) 108 (90 BASE) MCG/ACT inhaler Inhale 2 puffs into the lungs every 6 (six) hours as needed. 09/19/14   SDarlyne Russian MD  albuterol (PROVENTIL) (2.5 MG/3ML) 0.083% nebulizer solution Take 3 mLs (2.5 mg total) by nebulization every 6 (six) hours as needed. 09/19/14  Darlyne Russian, MD  ALPRAZolam Duanne Moron) 1 MG tablet TAKE ONE-HALF TABLET BY MOUTH ONCE DAILY AS NEEDED FOR STRESS 02/17/16   Darlyne Russian, MD  amLODipine (NORVASC) 10 MG tablet One a day 08/28/15   Darlyne Russian, MD  aspirin 81 MG tablet Take 81 mg by mouth daily.    Historical Provider, MD  augmented betamethasone dipropionate (DIPROLENE-AF) 0.05 % ointment Apply topically 2 (two) times daily. 03/25/15   Darlyne Russian, MD  BIOTIN PO Take 10,000 mcg by mouth daily.     Historical Provider, MD  cetirizine (ZYRTEC) 10 MG tablet Take 10 mg by mouth daily.    Historical Provider, MD  Dextrose, Diabetic Use, (GLUCOSE PO) Take by mouth as needed.     Historical Provider, MD  gabapentin (NEURONTIN) 100 MG capsule TAKE THREE CAPSULES BY MOUTH IN THE MORNING AND SIX CAPSULES AT NIGHT 03/25/15    Darlyne Russian, MD  HUMALOG 100 UNIT/ML injection Reported on 08/28/2015 12/12/14   Historical Provider, MD  levothyroxine (SYNTHROID, LEVOTHROID) 125 MCG tablet Take 1 tablet (125 mcg total) by mouth daily. 03/25/15   Darlyne Russian, MD  montelukast (SINGULAIR) 10 MG tablet TAKE ONE TABLET BY MOUTH AT BEDTIME 11/13/15   Darlyne Russian, MD  Multiple Vitamins-Minerals (MULTIVITAMIN WITH MINERALS) tablet Take 1 tablet by mouth daily.    Historical Provider, MD  naproxen sodium (ANAPROX) 220 MG tablet Take 220 mg by mouth as needed.    Historical Provider, MD  potassium chloride SA (K-DUR,KLOR-CON) 20 MEQ tablet Take 20 mEq by mouth 2 (two) times daily.    Historical Provider, MD  Probiotic Product (PROBIOTIC DAILY PO) Take by mouth.    Historical Provider, MD  rosuvastatin (CRESTOR) 20 MG tablet Take 1 tablet (20 mg total) by mouth daily. 12/20/14   Darlyne Russian, MD  sertraline (ZOLOFT) 50 MG tablet TAKE ONE TABLET BY MOUTH ONCE DAILY 09/16/15   Darlyne Russian, MD  Tretinoin-Cleanser-Moisturizer 0.025 % CREAM KIT Apply once daily as instructed 03/25/15   Darlyne Russian, MD  valsartan-hydrochlorothiazide (DIOVAN HCT) 320-12.5 MG tablet Take 1 tablet by mouth daily. 09/19/15   Darlyne Russian, MD  Vitamin D, Ergocalciferol, (DRISDOL) 50000 UNITS CAPS capsule Take 1 capsule (50,000 Units total) by mouth once a week. 03/25/15   Darlyne Russian, MD  zolpidem (AMBIEN) 10 MG tablet TAKE ONE-HALF TABLET BY MOUTH AT BEDTIME AS NEEDED 06/10/14   Darlyne Russian, MD  zoster vaccine live, PF, (ZOSTAVAX) 81017 UNT/0.65ML injection Inject 19,400 Units into the skin once. 03/25/15   Darlyne Russian, MD     ROS:  Constitutional: negative for fever, chills, night sweats, weight changes, or fatigue  HEENT: negative for vision changes, hearing loss, congestion, rhinorrhea, ST, epistaxis, or sinus pressure Cardiovascular: negative for chest pain or palpitations Respiratory: negative for hemoptysis, wheezing, shortness of breath, or  cough Abdominal: negative for abdominal pain, nausea, vomiting, diarrhea, or constipation Dermatological: negative for rash; positive for burn Neurologic: negative for headache, dizziness, or syncope All other systems reviewed and are otherwise negative with the exception to those above and in the HPI.  PHYSICAL EXAM: Filed Vitals:   03/04/16 0956 03/04/16 1015  BP: 150/64 142/60  Pulse: 72   Temp: 98.1 F (36.7 C)   Resp: 16    Body mass index is 30.88 kg/(m^2).   General: Alert, no acute distress; insulin infusion pump inserted HEENT:  Normocephalic, atraumatic, oropharynx patent. Eye: [facility] Cardiovascular:  Grade  1 systolic over left upper sternum border, Regular rate and rhythm, no rubs or gallops.  No Carotid bruits, radial pulse intact. No pedal edema.  Respiratory: Clear to auscultation bilaterally.  No wheezes, rales, or rhonchi.  No cyanosis, no use of accessory musculature Abdominal: No organomegaly, abdomen is soft and non-tender, positive bowel sounds. No masses. Musculoskeletal: Gait intact. No edema, tenderness Skin: 2x5cm second degree burn on the radial side of the right upper forearm Neurologic: Facial musculature symmetric. Psychiatric: Patient acts appropriately throughout our interaction.  Lymphatic: No cervical or submandibular lymphadenopathy Genitourinary/Anorectal: No acute findings  BP recheck in room, sitting (left arm, manual): 150/64  LABS:   EKG/XRAY:     ASSESSMENT/PLAN: Patient doing well. I did refill her atenolol but at a lower dose of 25 mg twice a day. Other medications were The same. She is on Aldactone as well as an ARB but last potassium was normal. She has also suffered a burn to her right arm and this was treated with Xylocaine jelly followed by a prescription for Silvadene and Silvadene was applied prior to leaving.I personally performed the services described in this documentation, which was scribed in my presence. The recorded  information has been reviewed and is accurate.  Gross sideeffects, risk and benefits, and alternatives of medications d/w patient. Patient is aware that all medications have potential sideeffects and we are unable to predict every sideeffect or drug-drug interaction that may occur.  Ashley Queen MD 03/04/2016 11:08 AM

## 2016-03-04 NOTE — Patient Instructions (Signed)
     IF you received an x-ray today, you will receive an invoice from [facility]. Please contact [facility] at [phone] with questions or concerns regarding your invoice.   IF you received labwork today, you will receive an invoice from [facility]. Please contact [facility] at [phone] with questions or concerns regarding your invoice.   Our billing staff will not be able to assist you with questions regarding bills from these companies.  You will be contacted with the lab results as soon as they are available. The fastest way to get your results is to activate your My Chart account. Instructions are located on the last page of this paperwork. If you have not heard from us regarding the results in 2 weeks, please contact this office.      

## 2016-03-06 ENCOUNTER — Encounter: Payer: Self-pay | Admitting: Emergency Medicine

## 2016-03-25 ENCOUNTER — Other Ambulatory Visit: Payer: Self-pay | Admitting: Emergency Medicine

## 2016-03-29 ENCOUNTER — Other Ambulatory Visit: Payer: Self-pay | Admitting: Emergency Medicine

## 2016-04-06 ENCOUNTER — Other Ambulatory Visit: Payer: Self-pay

## 2016-04-06 MED ORDER — TRETINOIN 0.025 % EX CREA: TOPICAL_CREAM | Freq: Every day | CUTANEOUS | 5 refills | Status: DC

## 2016-04-06 NOTE — Telephone Encounter (Signed)
She does have acne on her face related to her diabetes and aging. If I need to document that in the record that would be fine. It is okay to send in a prescription.

## 2016-04-06 NOTE — Telephone Encounter (Signed)
PA is needed for Tretin-X cream kit. Dr Everlene Farrier, I spoke to pharmacy and they suggested that we try sending a Rx for the pended tretinoin cream instead. I often have to do PAs for it as well, but more likely to be approved. I don't see anywhere in EPIC though what this is being Rxd for. It won't be approved for aging skin or similar Dx like anti-wrinkle. I've only ever gotten it approved for acne. Please advise.

## 2016-04-06 NOTE — Telephone Encounter (Signed)
Completed PA on covermymeds and received approval for coverage of tretinoin cream. Notified pharm.

## 2016-04-06 NOTE — Telephone Encounter (Signed)
I called pharm and this also requires a PA.

## 2016-04-08 NOTE — Telephone Encounter (Signed)
Approval is good through 04/08/18.

## 2016-05-10 ENCOUNTER — Encounter: Payer: Self-pay | Admitting: Emergency Medicine

## 2016-05-10 DIAGNOSIS — Z1231 Encounter for screening mammogram for malignant neoplasm of breast: Secondary | ICD-10-CM | POA: Diagnosis not present

## 2016-05-10 DIAGNOSIS — Z803 Family history of malignant neoplasm of breast: Secondary | ICD-10-CM | POA: Diagnosis not present

## 2016-05-14 ENCOUNTER — Other Ambulatory Visit: Payer: Self-pay | Admitting: Emergency Medicine

## 2016-05-20 ENCOUNTER — Other Ambulatory Visit: Payer: Self-pay | Admitting: Emergency Medicine

## 2016-05-21 ENCOUNTER — Telehealth: Payer: Self-pay | Admitting: Emergency Medicine

## 2016-05-21 NOTE — Telephone Encounter (Signed)
Call Natalia LeatherwoodKatherine and tell her I refilled her vitamin D. Tell her to request from the next physician drawing her blood to run a vitamin D level.

## 2016-05-21 NOTE — Telephone Encounter (Signed)
Dr Cleta Albertsaub, pt was here for check up on chronic meds in June, but don't see lab done or discussion of vit D lately. Do you want to give RFs?

## 2016-05-25 NOTE — Telephone Encounter (Signed)
Pt.notified

## 2016-06-11 DIAGNOSIS — E039 Hypothyroidism, unspecified: Secondary | ICD-10-CM | POA: Diagnosis not present

## 2016-06-11 DIAGNOSIS — E7800 Pure hypercholesterolemia, unspecified: Secondary | ICD-10-CM | POA: Diagnosis not present

## 2016-06-11 DIAGNOSIS — E109 Type 1 diabetes mellitus without complications: Secondary | ICD-10-CM | POA: Diagnosis not present

## 2016-06-11 DIAGNOSIS — Z9641 Presence of insulin pump (external) (internal): Secondary | ICD-10-CM | POA: Diagnosis not present

## 2016-07-19 DIAGNOSIS — Z794 Long term (current) use of insulin: Secondary | ICD-10-CM | POA: Diagnosis not present

## 2016-07-19 DIAGNOSIS — R0602 Shortness of breath: Secondary | ICD-10-CM | POA: Diagnosis not present

## 2016-07-19 DIAGNOSIS — I10 Essential (primary) hypertension: Secondary | ICD-10-CM | POA: Diagnosis not present

## 2016-07-19 DIAGNOSIS — E119 Type 2 diabetes mellitus without complications: Secondary | ICD-10-CM | POA: Diagnosis not present

## 2016-09-10 DIAGNOSIS — I10 Essential (primary) hypertension: Secondary | ICD-10-CM | POA: Diagnosis not present

## 2016-09-10 DIAGNOSIS — I340 Nonrheumatic mitral (valve) insufficiency: Secondary | ICD-10-CM | POA: Diagnosis not present

## 2016-09-10 DIAGNOSIS — I359 Nonrheumatic aortic valve disorder, unspecified: Secondary | ICD-10-CM | POA: Diagnosis not present

## 2016-09-10 DIAGNOSIS — E039 Hypothyroidism, unspecified: Secondary | ICD-10-CM | POA: Diagnosis not present

## 2016-09-10 DIAGNOSIS — I471 Supraventricular tachycardia: Secondary | ICD-10-CM | POA: Diagnosis not present

## 2016-09-10 DIAGNOSIS — K5730 Diverticulosis of large intestine without perforation or abscess without bleeding: Secondary | ICD-10-CM | POA: Diagnosis not present

## 2016-09-10 DIAGNOSIS — K580 Irritable bowel syndrome with diarrhea: Secondary | ICD-10-CM | POA: Diagnosis not present

## 2016-09-10 DIAGNOSIS — E119 Type 2 diabetes mellitus without complications: Secondary | ICD-10-CM | POA: Diagnosis not present

## 2016-09-10 DIAGNOSIS — Z794 Long term (current) use of insulin: Secondary | ICD-10-CM | POA: Diagnosis not present

## 2016-09-10 DIAGNOSIS — Z23 Encounter for immunization: Secondary | ICD-10-CM | POA: Diagnosis not present

## 2016-09-10 DIAGNOSIS — Z205 Contact with and (suspected) exposure to viral hepatitis: Secondary | ICD-10-CM | POA: Diagnosis not present

## 2016-10-11 DIAGNOSIS — E1165 Type 2 diabetes mellitus with hyperglycemia: Secondary | ICD-10-CM | POA: Diagnosis not present

## 2016-10-11 DIAGNOSIS — E109 Type 1 diabetes mellitus without complications: Secondary | ICD-10-CM | POA: Diagnosis not present

## 2016-10-11 DIAGNOSIS — E039 Hypothyroidism, unspecified: Secondary | ICD-10-CM | POA: Diagnosis not present

## 2016-10-11 DIAGNOSIS — E7800 Pure hypercholesterolemia, unspecified: Secondary | ICD-10-CM | POA: Diagnosis not present

## 2016-10-11 DIAGNOSIS — Z9641 Presence of insulin pump (external) (internal): Secondary | ICD-10-CM | POA: Diagnosis not present

## 2016-10-12 ENCOUNTER — Other Ambulatory Visit: Payer: Self-pay | Admitting: Emergency Medicine

## 2016-10-12 DIAGNOSIS — I10 Essential (primary) hypertension: Secondary | ICD-10-CM

## 2016-10-12 NOTE — Telephone Encounter (Signed)
Patient is due for f/u. She verbalized understanding. 30 day supply given and she will plan to come back to the clinic for a recheck. She reports cough with ACEI but has done well with arb.  BP Readings from Last 3 Encounters:  03/04/16 (!) 142/60  01/13/16 (!) 104/49  08/28/15 (!) 183/68

## 2016-11-18 DIAGNOSIS — E119 Type 2 diabetes mellitus without complications: Secondary | ICD-10-CM | POA: Diagnosis not present

## 2017-01-10 DIAGNOSIS — E109 Type 1 diabetes mellitus without complications: Secondary | ICD-10-CM | POA: Diagnosis not present

## 2017-01-10 DIAGNOSIS — I10 Essential (primary) hypertension: Secondary | ICD-10-CM | POA: Diagnosis not present

## 2017-01-10 DIAGNOSIS — E039 Hypothyroidism, unspecified: Secondary | ICD-10-CM | POA: Diagnosis not present

## 2017-01-10 DIAGNOSIS — E1165 Type 2 diabetes mellitus with hyperglycemia: Secondary | ICD-10-CM | POA: Diagnosis not present

## 2017-01-10 DIAGNOSIS — Z9641 Presence of insulin pump (external) (internal): Secondary | ICD-10-CM | POA: Diagnosis not present

## 2017-01-17 ENCOUNTER — Ambulatory Visit (INDEPENDENT_AMBULATORY_CARE_PROVIDER_SITE_OTHER): Payer: Medicare Other | Admitting: Adult Health

## 2017-01-17 ENCOUNTER — Encounter: Payer: Self-pay | Admitting: Adult Health

## 2017-01-17 VITALS — BP 109/64 | HR 57 | Resp 16 | Ht 63.25 in | Wt 174.0 lb

## 2017-01-17 DIAGNOSIS — G4733 Obstructive sleep apnea (adult) (pediatric): Secondary | ICD-10-CM | POA: Diagnosis not present

## 2017-01-17 DIAGNOSIS — Z9989 Dependence on other enabling machines and devices: Secondary | ICD-10-CM

## 2017-01-17 NOTE — Patient Instructions (Signed)
Continue using CPAP nightly If your symptoms worsen or you develop new symptoms please let us know.   

## 2017-01-17 NOTE — Progress Notes (Signed)
PATIENT: [name] DOB: 1951/07/30  REASON FOR VISIT: follow up- OSA on CPAP HISTORY FROM: patient  HISTORY OF PRESENT ILLNESS: [name] is a 66 year old female with a history of osa on cpap. She returns today for a compliance download. Her download indicates that she used her machine 26/30 days for a compliance of 87%. Each night she uses her machine greater than 4 hours. On average she uses her machine 9 hours and 35 minutes. Her residual AHI is 2.4 cm of water with EPR of 3. She does not have a significant leak. Overall she is doing well. Denies daytime sleepiness. Denies any new neurological symptoms. She returns today for an evaluation.  HISTORY  [name] is a 66 year old female with a history of sleep apnea on CPAP. She returns today for a compliance download. Her download indicates that she uses her machine 30 out of 30 days for compliance of 100%. She uses her machine greater than 4 hours 27 out of 30 days for compliance of 90%. On average she uses her machine 6 hours and 55 minutes. Her residual AHI is 3.7 on 6 cm of water with EPR of 3. She does not have a significant leak. She states that she cannot tolerate the mask that she was given. She has been using the nasal cannulas that her husband use. She states that this is been working well for her. Her Epworth sleepiness score is 1 and fatigue severity score is 36. She also states that she would like to change her DME company to [employer]. She returns today for an evaluation.  HISTORY (DOHMEIER):[name] is a 66 y.o. female seen here as a referral from [name] and [name] for a sleep evaluation.  [name] is a right-handed, ambi-dexterous, Caucasian female patient of [name]. It was him who sent her here today for a sleep evaluation the patient reports that she has been known to snore and that she feels she sleeps deep and sound and heart". She endorses that she has a heart murmur often feels fatigued in  daytime and sometimes has wheezing again snoring she has the environmental allergies and some component of restless legs is noted. She also feels often hot cold or has increased thirst and endorsed flushing. Already established our diagnoses of hypertension, diabetes, high cholesterol, heart disease, fibromyalgia, anxiety-depression and the patient has undergone a hysterectomy and cholecystectomy in the past. [name] reported that she has aortic and mitral valve leakage, and incomplete right bundle branch block, and an enlarged left atrium. She was referred to [name] recently for uncontrolled diabetes.  The patient usually goes to bed about 10:30 sometimes she will read occasionally she will watch TV but this seems not to be a routine. Her bedroom is described as cool, quiet and dark. The couple sleeps in different bedrooms as a bolus find the partner snoring disturbing. She will wake up up to 4 or 5 times at night from her, some of these are nocturia is but mostly she goes to the bathroom because she is already awake. She will wake up about 7:30 usually without alarm. She estimates that her total sleep time is 6 hours or less overnight. She does not nap in daytime. Due to her condition of fibromyalgia she associates the discomfort often with sleep interruption. When she rises she will take coffee usually she has breakfast. She just is a decided to retire in December 2015. Her previous job was a  daytime employment and she was self-employed as a Scientist, forensic and worked at [employer].She has no history of TBI, of skull trauma . She broke her nose once. No nasal septum deviation.   1) [name] is a patient with a chief complaint of nonrestorative sleep, and multiple factors play a role in this. There is a snoring Witnessed apnea -reported by her husband. There is less restful and less sound sleep due to underlying pain and discomfort, attributed to fibromyalgia.  2)There is sleep  interruption from nocturia which can be diabetes related but nocturia can also occur in obstructive sleep apnea. The patient sometimes wakes up with headaches in the morning and she may revealed retain CO2 overnight which would lead to avascular dilation and a global headache but then clears as the day goes on. She is more prone to have this condition because she has a primary pulmonary abnormality with a diagnosis of asthma. 3)Her hypertension may be better controlled if apnea is found and treated. Hypoxemia and hypercapnia can also be treated with CPAP and often allow a patient continues restful and more sound sleep with less nocturia without a dry mouth and waking up without headaches.  The patient was advised of the nature of the diagnosed sleep disorder , the treatment options and risks for general a health and wellness arising from not treating the condition. Visit duration was 45 minutes.more than 50% of our face to face time was dedicated to the diagnosis discussion, explanation of the choice of tests and which treatments may result  REVIEW OF SYSTEMS: Out of a complete 14 system review of symptoms, the patient complains only of the following symptoms, and all other reviewed systems are negative.  Restless leg  ALLERGIES: Allergies  Allergen Reactions  . Penicillins Anaphylaxis  . Ace Inhibitors     INDUCED BRONCHOSPASM    HOME MEDICATIONS: Outpatient Medications Prior to Visit  Medication Sig Dispense Refill  . albuterol (PROVENTIL HFA;VENTOLIN HFA) 108 (90 BASE) MCG/ACT inhaler Inhale 2 puffs into the lungs every 6 (six) hours as needed. 8.5 g 11  . albuterol (PROVENTIL) (2.5 MG/3ML) 0.083% nebulizer solution Take 3 mLs (2.5 mg total) by nebulization every 6 (six) hours as needed. 75 mL 1  . ALPRAZolam (XANAX) 1 MG tablet TAKE ONE-HALF TABLET BY MOUTH ONCE DAILY AS NEEDED FOR STRESS 90 tablet 1  . amLODipine (NORVASC) 10 MG tablet One a day 30 tablet 11  . aspirin 81 MG tablet Take  81 mg by mouth daily.    Marland Kitchen atenolol (TENORMIN) 25 MG tablet Take 1 tablet (25 mg total) by mouth 2 (two) times daily. 180 tablet 3  . atorvastatin (LIPITOR) 40 MG tablet Take 1 tablet (40 mg total) by mouth daily. 90 tablet 3  . augmented betamethasone dipropionate (DIPROLENE-AF) 0.05 % ointment Apply topically 2 (two) times daily. 30 g 5  . BIOTIN PO Take 10,000 mcg by mouth daily.     . cetirizine (ZYRTEC) 10 MG tablet Take 10 mg by mouth daily.    Marland Kitchen Dextrose, Diabetic Use, (GLUCOSE PO) Take by mouth as needed.     . gabapentin (NEURONTIN) 100 MG capsule TAKE THREE CAPSULES BY MOUTH IN THE MORNING AND SIX CAPSULES AT NIGHT 270 capsule 11  . HUMALOG 100 UNIT/ML injection Reported on 03/04/2016    . levothyroxine (SYNTHROID, LEVOTHROID) 125 MCG tablet Take 1 tablet (125 mcg total) by mouth daily. 30 tablet 11  . montelukast (SINGULAIR) 10 MG tablet TAKE ONE TABLET BY MOUTH AT  BEDTIME 30 tablet 0  . Multiple Vitamins-Minerals (MULTIVITAMIN WITH MINERALS) tablet Take 1 tablet by mouth daily.    . naproxen sodium (ANAPROX) 220 MG tablet Take 220 mg by mouth as needed. Reported on 03/04/2016    . potassium chloride SA (K-DUR,KLOR-CON) 20 MEQ tablet Take 1 tablet (20 mEq total) by mouth 2 (two) times daily. 180 tablet 3  . Probiotic Product (PROBIOTIC DAILY PO) Take by mouth.    . sertraline (ZOLOFT) 50 MG tablet Take 1 tablet (50 mg total) by mouth daily. 90 tablet 3  . silver sulfADIAZINE (SILVADENE) 1 % cream Apply 1 application topically daily. 50 g 1  . spironolactone (ALDACTONE) 50 MG tablet Take 50 mg by mouth daily.    . TRETIN-X 0.025 % CREAM KIT APPLY ONCE DAILY AS INSTRUCTED 1 kit 10  . tretinoin (RETIN-A) 0.025 % cream Apply topically at bedtime. 45 g 5  . valsartan-hydrochlorothiazide (DIOVAN-HCT) 320-12.5 MG tablet TAKE ONE TABLET BY MOUTH ONCE DAILY 30 tablet 0  . Vitamin D, Ergocalciferol, (DRISDOL) 50000 units CAPS capsule TAKE ONE CAPSULE BY MOUTH ONCE A WEEK 12 capsule 2  . zolpidem  (AMBIEN) 10 MG tablet TAKE ONE-HALF TABLET BY MOUTH AT BEDTIME AS NEEDED 30 tablet 0  . zoster vaccine live, PF, (ZOSTAVAX) 13086 UNT/0.65ML injection Inject 19,400 Units into the skin once. 1 each 0   No facility-administered medications prior to visit.     PAST MEDICAL HISTORY: Past Medical History:  Diagnosis Date  . Anxiety   . Asthma   . Depression   . Depression   . Diabetes mellitus   . Fibromyalgia   . GERD (gastroesophageal reflux disease)   . Goiter   . Hair loss   . Hypertension   . Menopause   . Other and unspecified hyperlipidemia   . Positive PPD   . SVT (supraventricular tachycardia) ([place])   . Vasculitis ([place])     PAST SURGICAL HISTORY: Past Surgical History:  Procedure Laterality Date  . APPENDECTOMY    . CHOLECYSTECTOMY    . PARTIAL HYSTERECTOMY      FAMILY HISTORY: Family History  Problem Relation Age of Onset  . Hepatitis Mother 76    died from [place]  . Diabetes Father   . Cancer Brother   . Crohn's disease Brother   . Alcohol abuse Brother     SOCIAL HISTORY: Social History   Social History  . Marital status: Married    Spouse name: N/A  . Number of children: N/A  . Years of education: N/A   Occupational History  . Not on file.   Social History Main Topics  . Smoking status: Former Smoker    Years: 15.00    Types: Cigarettes    Quit date: 03/13/1988  . Smokeless tobacco: Not on file  . Alcohol use No  . Drug use: No  . Sexual activity: Not on file   Other Topics Concern  . Not on file   Social History Narrative   Caffeine 1-2 cups coffee daily.   Lives at home with husband and daughter.   Retired.   Associates degree.   Has 4 kids.      PHYSICAL EXAM  Vitals:   01/17/17 0722  BP: 109/64  Pulse: (!) 57  Resp: 16  Weight: 174 lb (78.9 kg)  Height: 5' 3.25" (1.607 m)   Body mass index is 30.58 kg/m.  Generalized: Well developed, in no acute distress   Neurological examination  Mentation: Alert oriented  to  time, place, history taking. Follows all commands speech and language fluent Cranial nerve II-XII: Pupils were equal round reactive to light. Extraocular movements were full, visual field were full on confrontational test. Facial sensation and strength were normal. Uvula tongue midline. Head turning and shoulder shrug  were normal and symmetric. Motor: The motor testing reveals 5 over 5 strength of all 4 extremities. Good symmetric motor tone is noted throughout.  Sensory: Sensory testing is intact to soft touch on all 4 extremities. No evidence of extinction is noted.  Coordination: Cerebellar testing reveals good finger-nose-finger and heel-to-shin bilaterally.  Gait and station: Gait is normal. Tandem gait is normal. Romberg is negative. No drift is seen.  Reflexes: Deep tendon reflexes are symmetric and normal bilaterally.   DIAGNOSTIC DATA (LABS, IMAGING, TESTING) - I reviewed patient records, labs, notes, testing and imaging myself where available.  Lab Results  Component Value Date   WBC 6.6 08/28/2015   HGB 14.8 08/28/2015   HCT 44.5 08/28/2015   MCV 82.4 08/28/2015   PLT 224 08/28/2015      Component Value Date/Time   NA 142 08/28/2015 1050   K 3.8 08/28/2015 1050   CL 102 08/28/2015 1050   CO2 28 08/28/2015 1050   GLUCOSE 98 08/28/2015 1050   BUN 8 08/28/2015 1050   CREATININE 0.63 08/28/2015 1050   CALCIUM 9.5 08/28/2015 1050   PROT 7.4 08/28/2015 1050   ALBUMIN 4.2 08/28/2015 1050   AST 24 08/28/2015 1050   ALT 22 08/28/2015 1050   ALKPHOS 59 08/28/2015 1050   BILITOT 0.5 08/28/2015 1050   GFRNONAA >89 08/28/2015 1050   GFRAA >89 08/28/2015 1050   Lab Results  Component Value Date   CHOL 179 08/28/2015   HDL 47 08/28/2015   LDLCALC 104 08/28/2015   TRIG 139 08/28/2015   CHOLHDL 3.8 08/28/2015   Lab Results  Component Value Date   HGBA1C 6.7 12/20/2014   Lab Results  Component Value Date   VITAMINB12 626 10/21/2006   Lab Results  Component Value Date     TSH 1.658 08/28/2015      ASSESSMENT AND PLAN 66 y.o. year old female  has a past medical history of Anxiety; Asthma; Depression; Depression; Diabetes mellitus; Fibromyalgia; GERD (gastroesophageal reflux disease); Goiter; Hair loss; Hypertension; Menopause; Other and unspecified hyperlipidemia; Positive PPD; SVT (supraventricular tachycardia) ([place]); and Vasculitis ([place]). here with   1. OSA on CPAP  Overall the patients download shows excellence compliance and good treatment of apnea. She is encouraged to continue using cpap nightly and for >4 hours each night. If her symptoms worsens or she develops new symptoms she should let us know. She will follow up in 1 year with Dr. Brett Fairy.   I spent 15 minutes with the patient 50 % of this time was spent reviewing CPAP download.    Ward Givens, MSN, NP-C 01/17/2017, 6:28 AM [facility] [address], [place], [place] [address] [phone]

## 2017-01-17 NOTE — Progress Notes (Signed)
I agree with the assessment and plan as directed by NP .The patient is known to me .   Amiayah Giebel, MD  

## 2017-02-22 DIAGNOSIS — I471 Supraventricular tachycardia: Secondary | ICD-10-CM | POA: Diagnosis not present

## 2017-02-22 DIAGNOSIS — Z23 Encounter for immunization: Secondary | ICD-10-CM | POA: Diagnosis not present

## 2017-02-22 DIAGNOSIS — I359 Nonrheumatic aortic valve disorder, unspecified: Secondary | ICD-10-CM | POA: Diagnosis not present

## 2017-02-22 DIAGNOSIS — Z136 Encounter for screening for cardiovascular disorders: Secondary | ICD-10-CM | POA: Diagnosis not present

## 2017-02-22 DIAGNOSIS — I340 Nonrheumatic mitral (valve) insufficiency: Secondary | ICD-10-CM | POA: Diagnosis not present

## 2017-02-22 DIAGNOSIS — G2581 Restless legs syndrome: Secondary | ICD-10-CM | POA: Diagnosis not present

## 2017-02-22 DIAGNOSIS — I10 Essential (primary) hypertension: Secondary | ICD-10-CM | POA: Diagnosis not present

## 2017-02-22 DIAGNOSIS — Z0000 Encounter for general adult medical examination without abnormal findings: Secondary | ICD-10-CM | POA: Diagnosis not present

## 2017-02-22 DIAGNOSIS — E119 Type 2 diabetes mellitus without complications: Secondary | ICD-10-CM | POA: Diagnosis not present

## 2017-02-22 DIAGNOSIS — E039 Hypothyroidism, unspecified: Secondary | ICD-10-CM | POA: Diagnosis not present

## 2017-02-22 DIAGNOSIS — F418 Other specified anxiety disorders: Secondary | ICD-10-CM | POA: Diagnosis not present

## 2017-02-22 DIAGNOSIS — J4520 Mild intermittent asthma, uncomplicated: Secondary | ICD-10-CM | POA: Diagnosis not present

## 2017-04-04 DIAGNOSIS — E109 Type 1 diabetes mellitus without complications: Secondary | ICD-10-CM | POA: Diagnosis not present

## 2017-04-04 DIAGNOSIS — E7800 Pure hypercholesterolemia, unspecified: Secondary | ICD-10-CM | POA: Diagnosis not present

## 2017-04-04 DIAGNOSIS — E039 Hypothyroidism, unspecified: Secondary | ICD-10-CM | POA: Diagnosis not present

## 2017-04-11 DIAGNOSIS — E109 Type 1 diabetes mellitus without complications: Secondary | ICD-10-CM | POA: Diagnosis not present

## 2017-04-11 DIAGNOSIS — Z9641 Presence of insulin pump (external) (internal): Secondary | ICD-10-CM | POA: Diagnosis not present

## 2017-04-11 DIAGNOSIS — E039 Hypothyroidism, unspecified: Secondary | ICD-10-CM | POA: Diagnosis not present

## 2017-04-11 DIAGNOSIS — E7800 Pure hypercholesterolemia, unspecified: Secondary | ICD-10-CM | POA: Diagnosis not present

## 2017-04-29 DIAGNOSIS — I10 Essential (primary) hypertension: Secondary | ICD-10-CM | POA: Diagnosis not present

## 2017-04-29 DIAGNOSIS — E039 Hypothyroidism, unspecified: Secondary | ICD-10-CM | POA: Diagnosis not present

## 2017-05-10 DIAGNOSIS — R0989 Other specified symptoms and signs involving the circulatory and respiratory systems: Secondary | ICD-10-CM | POA: Diagnosis not present

## 2017-05-13 DIAGNOSIS — Z794 Long term (current) use of insulin: Secondary | ICD-10-CM | POA: Diagnosis not present

## 2017-05-13 DIAGNOSIS — E119 Type 2 diabetes mellitus without complications: Secondary | ICD-10-CM | POA: Diagnosis not present

## 2017-05-13 DIAGNOSIS — I10 Essential (primary) hypertension: Secondary | ICD-10-CM | POA: Diagnosis not present

## 2017-05-13 DIAGNOSIS — R0602 Shortness of breath: Secondary | ICD-10-CM | POA: Diagnosis not present

## 2017-06-15 DIAGNOSIS — E109 Type 1 diabetes mellitus without complications: Secondary | ICD-10-CM | POA: Diagnosis not present

## 2017-06-15 DIAGNOSIS — Z23 Encounter for immunization: Secondary | ICD-10-CM | POA: Diagnosis not present

## 2017-06-15 DIAGNOSIS — I10 Essential (primary) hypertension: Secondary | ICD-10-CM | POA: Diagnosis not present

## 2017-06-15 DIAGNOSIS — E7800 Pure hypercholesterolemia, unspecified: Secondary | ICD-10-CM | POA: Diagnosis not present

## 2017-06-15 DIAGNOSIS — Z9641 Presence of insulin pump (external) (internal): Secondary | ICD-10-CM | POA: Diagnosis not present

## 2017-06-15 DIAGNOSIS — E039 Hypothyroidism, unspecified: Secondary | ICD-10-CM | POA: Diagnosis not present

## 2017-06-21 ENCOUNTER — Encounter: Payer: Self-pay | Admitting: Family Medicine

## 2017-06-21 ENCOUNTER — Other Ambulatory Visit: Payer: Self-pay | Admitting: Family Medicine

## 2017-06-30 ENCOUNTER — Encounter: Payer: Self-pay | Admitting: Physician Assistant

## 2017-06-30 ENCOUNTER — Ambulatory Visit (INDEPENDENT_AMBULATORY_CARE_PROVIDER_SITE_OTHER): Payer: Medicare Other | Admitting: Physician Assistant

## 2017-06-30 VITALS — BP 122/74 | HR 62 | Temp 98.4°F | Resp 16 | Ht 63.5 in | Wt 176.6 lb

## 2017-06-30 DIAGNOSIS — J4520 Mild intermittent asthma, uncomplicated: Secondary | ICD-10-CM

## 2017-06-30 DIAGNOSIS — F419 Anxiety disorder, unspecified: Secondary | ICD-10-CM | POA: Diagnosis not present

## 2017-06-30 DIAGNOSIS — E039 Hypothyroidism, unspecified: Secondary | ICD-10-CM | POA: Diagnosis not present

## 2017-06-30 DIAGNOSIS — E0842 Diabetes mellitus due to underlying condition with diabetic polyneuropathy: Secondary | ICD-10-CM

## 2017-06-30 DIAGNOSIS — K219 Gastro-esophageal reflux disease without esophagitis: Secondary | ICD-10-CM

## 2017-06-30 DIAGNOSIS — G4733 Obstructive sleep apnea (adult) (pediatric): Secondary | ICD-10-CM

## 2017-06-30 DIAGNOSIS — I341 Nonrheumatic mitral (valve) prolapse: Secondary | ICD-10-CM

## 2017-06-30 DIAGNOSIS — E559 Vitamin D deficiency, unspecified: Secondary | ICD-10-CM | POA: Diagnosis not present

## 2017-06-30 DIAGNOSIS — I351 Nonrheumatic aortic (valve) insufficiency: Secondary | ICD-10-CM | POA: Insufficient documentation

## 2017-06-30 DIAGNOSIS — F32A Depression, unspecified: Secondary | ICD-10-CM | POA: Insufficient documentation

## 2017-06-30 DIAGNOSIS — Z9989 Dependence on other enabling machines and devices: Secondary | ICD-10-CM | POA: Diagnosis not present

## 2017-06-30 DIAGNOSIS — E1140 Type 2 diabetes mellitus with diabetic neuropathy, unspecified: Secondary | ICD-10-CM | POA: Insufficient documentation

## 2017-06-30 DIAGNOSIS — F411 Generalized anxiety disorder: Secondary | ICD-10-CM

## 2017-06-30 DIAGNOSIS — E785 Hyperlipidemia, unspecified: Secondary | ICD-10-CM | POA: Diagnosis not present

## 2017-06-30 DIAGNOSIS — F329 Major depressive disorder, single episode, unspecified: Secondary | ICD-10-CM | POA: Insufficient documentation

## 2017-06-30 DIAGNOSIS — Z0000 Encounter for general adult medical examination without abnormal findings: Secondary | ICD-10-CM

## 2017-06-30 NOTE — Progress Notes (Signed)
Patient ID: [name] MRN: [id number], DOB: Jun 29, 1951, 66 y.o. Date of Encounter: _0 @  Chief Complaint:  Chief Complaint  Patient presents with  . New Patient (Initial Visit)    HPI: 66 y.o. year old female  presents as a New Patient to Establish Care.   When I enter the room she asked if I am planning to retire or quit working here any time soon. Says that she had been seeing Dr. Everlene Farrier for 30 years and then he retired.  She then saw Dr. Sheryn Bison with [facility] and he retired.  Then had a hard time finding anyone that excepted Medicare etc.  Says that Dr. Einar Gip recommended her coming here and that this location is good for her -- she lives near [employer].  She sees the following specialists on a routine basis:  She sees Dr. Soyla Murphy for type 2 insulin-dependent diabetes mellitus every 3 months. She is on an insulin pump.  She sees Dr. Einar Gip routinely. Says that she had been seeing him every 6 months and then he recently let her go to seeing him just once a year-- as things are now stable/controlled. She has a history of SVT, hypertension, aortic regurgitation and mitral valve prolapse. All of this information is reported from the patient. I do not have records from Dr. Einar Gip.  She states that she has "memorized that list"  but she also brings in a typed print out of information with her today. She is extremely organized----she has a case that contains all of her medication bottles, list of he medical providers and print out of her medical information.  She reports that she has her colonoscopy with [facility] -- Dr. Henrene Pastor.  Reports that she has her mammograms at [facility].  She has no specific concerns to address today. All of her current medical problems are stable and controlled with current medications.   Past Medical History:  Diagnosis Date  . Anxiety   . Asthma   . Depression   . Depression   . Diabetes mellitus   . Fibromyalgia   . GERD (gastroesophageal  reflux disease)   . Goiter   . Hair loss   . Hypertension   . Menopause   . Other and unspecified hyperlipidemia   . Positive PPD   . SVT (supraventricular tachycardia) ([place])   . Vasculitis ([place])      Home Meds: Outpatient Medications Prior to Visit  Medication Sig Dispense Refill  . albuterol (PROVENTIL HFA;VENTOLIN HFA) 108 (90 BASE) MCG/ACT inhaler Inhale 2 puffs into the lungs every 6 (six) hours as needed. 8.5 g 11  . albuterol (PROVENTIL) (2.5 MG/3ML) 0.083% nebulizer solution Take 3 mLs (2.5 mg total) by nebulization every 6 (six) hours as needed. 75 mL 1  . ALPRAZolam (XANAX) 1 MG tablet TAKE ONE-HALF TABLET BY MOUTH ONCE DAILY AS NEEDED FOR STRESS 90 tablet 1  . amLODipine (NORVASC) 10 MG tablet One a day 30 tablet 11  . aspirin 81 MG tablet Take 81 mg by mouth daily.    Marland Kitchen atenolol (TENORMIN) 25 MG tablet Take 1 tablet (25 mg total) by mouth 2 (two) times daily. 180 tablet 3  . atorvastatin (LIPITOR) 40 MG tablet Take 1 tablet (40 mg total) by mouth daily. 90 tablet 3  . augmented betamethasone dipropionate (DIPROLENE-AF) 0.05 % ointment Apply topically 2 (two) times daily. 30 g 5  . BIOTIN PO Take 10,000 mcg by mouth daily.     . cetirizine (ZYRTEC) 10 MG  tablet Take 10 mg by mouth daily.    Marland Kitchen CINNAMON PO Take 1,000 mg by mouth 2 (two) times daily.    Marland Kitchen Dextrose, Diabetic Use, (GLUCOSE PO) Take by mouth as needed.     . gabapentin (NEURONTIN) 100 MG capsule TAKE THREE CAPSULES BY MOUTH IN THE MORNING AND SIX CAPSULES AT NIGHT 270 capsule 11  . hydrochlorothiazide (HYDRODIURIL) 25 MG tablet Take 25 mg by mouth daily.    Marland Kitchen levothyroxine (SYNTHROID, LEVOTHROID) 125 MCG tablet Take 1 tablet (125 mcg total) by mouth daily. 30 tablet 11  . losartan (COZAAR) 100 MG tablet Take 100 mg by mouth daily.    . magnesium oxide (MAG-OX) 400 MG tablet Take 400 mg by mouth daily.    . Multiple Vitamins-Minerals (MULTIVITAMIN WITH MINERALS) tablet Take 1 tablet by mouth daily.    . naproxen  sodium (ANAPROX) 220 MG tablet Take 220 mg by mouth as needed. Reported on 03/04/2016    . NOVOLIN R RELION 100 UNIT/ML injection     . potassium chloride SA (K-DUR,KLOR-CON) 20 MEQ tablet Take 1 tablet (20 mEq total) by mouth 2 (two) times daily. 180 tablet 3  . Probiotic Product (PROBIOTIC DAILY PO) Take by mouth.    Marland Kitchen rOPINIRole (REQUIP) 0.25 MG tablet     . sertraline (ZOLOFT) 50 MG tablet Take 1 tablet (50 mg total) by mouth daily. 90 tablet 3  . spironolactone (ALDACTONE) 50 MG tablet Take 50 mg by mouth daily.    . TRETIN-X 0.025 % CREAM KIT APPLY ONCE DAILY AS INSTRUCTED 1 kit 10  . tretinoin (RETIN-A) 0.025 % cream Apply topically at bedtime. 45 g 5  . valsartan-hydrochlorothiazide (DIOVAN-HCT) 320-12.5 MG tablet TAKE ONE TABLET BY MOUTH ONCE DAILY 30 tablet 0  . Vitamin D, Ergocalciferol, (DRISDOL) 50000 units CAPS capsule TAKE ONE CAPSULE BY MOUTH ONCE A WEEK 12 capsule 2   No facility-administered medications prior to visit.     Allergies:  Allergies  Allergen Reactions  . Penicillins Anaphylaxis  . Ace Inhibitors     INDUCED BRONCHOSPASM    Social History   Social History  . Marital status: Married    Spouse name: N/A  . Number of children: N/A  . Years of education: N/A   Occupational History  . Not on file.   Social History Main Topics  . Smoking status: Former Smoker    Years: 15.00    Types: Cigarettes    Quit date: 03/13/1988  . Smokeless tobacco: Never Used  . Alcohol use No  . Drug use: No  . Sexual activity: Not on file   Other Topics Concern  . Not on file   Social History Narrative   Caffeine 1-2 cups coffee daily.   Lives at home with husband and daughter.   Retired.   Associates degree.   Has 4 kids.    Family History  Problem Relation Age of Onset  . Hepatitis Mother 92       died from [place]  . Diabetes Father   . Cancer Brother   . Crohn's disease Brother   . Alcohol abuse Brother      Review of Systems:  See HPI for pertinent  ROS. All other ROS negative.    Physical Exam: Blood pressure 122/74, pulse 62, temperature 98.4 F (36.9 C), temperature source Oral, resp. rate 16, height 5' 3.5" (1.613 m), weight 80.1 kg (176 lb 9.6 oz), SpO2 98 %., Body mass index is 30.79 kg/m. General: WNWD  WF. Appears in no acute distress. Neck: Supple. No thyromegaly. No lymphadenopathy. Radiation of murmur to neck vs carotid bruit Lungs: Clear bilaterally to auscultation without wheezes, rales, or rhonchi. Breathing is unlabored. Heart: RRR.  III / VI murmur at right 2nd ICS Abdomen: Soft, non-tender, non-distended with normoactive bowel sounds. No hepatomegaly. No rebound/guarding. No obvious abdominal masses. Musculoskeletal:  Strength and tone normal for age. Extremities/Skin: Warm and dry.  No LE edema.  Neuro: Alert and oriented X 3. Moves all extremities spontaneously. Gait is normal. CNII-XII grossly in tact. Psych:  Responds to questions appropriately with a normal affect.     ASSESSMENT AND PLAN:  66 y.o. year old female with   1. Encounter for medical examination to establish care  2. Mild intermittent chronic asthma without complication Stable/controlled. Continue current medications.  3. OSA on CPAP Stable/controlled. Continue CPAP.  4. Gastroesophageal reflux disease, esophagitis presence not specified Stable/controlled. Continue current medication.  5. Hypothyroidism, unspecified type She is on levothyroxine. Check lab to monitor. - TSH  6.Anxiety and depression Stable/controlled. Continue current medication.  7. Vitamin D deficiency She is on high-dose vitamin D supplement. Will recheck vitamin D level to monitor. - VITAMIN D 25 Hydroxy (Vit-D Deficiency, Fractures)  8. Hyperlipidemia, unspecified hyperlipidemia type She is on atorvastatin 6m daily. She is fasting. Check labs to monitor.  - COMPLETE METABOLIC PANEL WITH GFR - Lipid panel  9. Aortic valve insufficiency, etiology of cardiac  valve disease unspecified Managed by Dr. GEinar Gip 10. Mitral valve prolapse Managed by Dr. GEinar Gip 11. Diabetic polyneuropathy associated with diabetes mellitus due to underlying condition ([place] Stable/controlled on current medication.     Regarding preventative care---- she reports the following: States that her tetanus is current, has received hepatitis B (3 of 3),has received Prevnar pneumonia vaccine 09/18/2014, flu vaccine 06/15/2017, shingles vaccine 05/2017.   Today her blood pressure is excellent. Will check lab to monitor. Will plan for routine follow-up visit in 6 months. Follow-up sooner if needed.  Signed, M78 Brickell StreetDGrand Point PUtah BFlorence Community Healthcare10/18/2018 10:47 AM

## 2017-07-01 LAB — COMPLETE METABOLIC PANEL WITH GFR
AG RATIO: 1.4 (calc) (ref 1.0–2.5)
ALKALINE PHOSPHATASE (APISO): 55 U/L (ref 33–130)
ALT: 19 U/L (ref 6–29)
AST: 20 U/L (ref 10–35)
Albumin: 4.1 g/dL (ref 3.6–5.1)
BILIRUBIN TOTAL: 0.7 mg/dL (ref 0.2–1.2)
BUN: 16 mg/dL (ref 7–25)
CO2: 26 mmol/L (ref 20–32)
Calcium: 9.3 mg/dL (ref 8.6–10.4)
Chloride: 102 mmol/L (ref 98–110)
Creat: 0.84 mg/dL (ref 0.50–0.99)
GFR, EST AFRICAN AMERICAN: 83 mL/min/{1.73_m2} (ref >=60)
GFR, Est Non African American: 72 mL/min/{1.73_m2} (ref >=60)
GLUCOSE: 192 mg/dL — AB (ref 65–99)
Globulin: 2.9 g/dL (calc) (ref 1.9–3.7)
POTASSIUM: 4.4 mmol/L (ref 3.5–5.3)
Sodium: 138 mmol/L (ref 135–146)
TOTAL PROTEIN: 7 g/dL (ref 6.1–8.1)

## 2017-07-01 LAB — VITAMIN D 25 HYDROXY (VIT D DEFICIENCY, FRACTURES): VIT D 25 HYDROXY: 41 ng/mL (ref 30–100)

## 2017-07-01 LAB — LIPID PANEL
CHOLESTEROL: 161 mg/dL (ref <200)
HDL: 47 mg/dL — AB (ref >50)
LDL CHOLESTEROL (CALC): 85 mg/dL
Non-HDL Cholesterol (Calc): 114 mg/dL (calc) (ref <130)
TRIGLYCERIDES: 191 mg/dL — AB (ref <150)
Total CHOL/HDL Ratio: 3.4 (calc) (ref <5.0)

## 2017-07-01 LAB — TSH: TSH: 1.84 m[IU]/L (ref 0.40–4.50)

## 2017-07-06 ENCOUNTER — Other Ambulatory Visit: Payer: Self-pay

## 2017-07-06 MED ORDER — ROPINIROLE HCL 0.25 MG PO TABS: 0.2500 mg | ORAL_TABLET | Freq: Every day | ORAL | 1 refills | Status: DC

## 2017-07-06 NOTE — Telephone Encounter (Signed)
This was prescribed by another provider now patient need refill what are the directions for use Also patient is taking vitamin D 50,000 units once a week

## 2017-07-06 NOTE — Telephone Encounter (Signed)
I spoke with patient and she states when she make changes to her vitamin d it drops her vitamin d level so she has been on vitamin D 50,000 units for years and would like to keep it like that

## 2017-07-06 NOTE — Telephone Encounter (Signed)
Regarding the Requip----this medication comes in many different doses. Find out from patient what dose she is taking. Either she can read it from her medicine bottle or we can find out from her pharmacy. Regarding the vitamin D----tell her that her vitamin D level is now in the normal range. Does not need to continue to take the high-dose. Remove that one from the medicine list. Start over-the-counter vitamin D 4000 units daily.

## 2017-07-12 ENCOUNTER — Encounter: Payer: Self-pay | Admitting: Internal Medicine

## 2017-08-01 ENCOUNTER — Encounter: Payer: Self-pay | Admitting: Physician Assistant

## 2017-08-01 MED ORDER — VITAMIN D (ERGOCALCIFEROL) 1.25 MG (50000 UNIT) PO CAPS: 50000.0000 [IU] | ORAL_CAPSULE | ORAL | 2 refills | Status: DC

## 2017-08-01 MED ORDER — ATORVASTATIN CALCIUM 40 MG PO TABS: 40.0000 mg | ORAL_TABLET | Freq: Every day | ORAL | 3 refills | Status: AC

## 2017-09-11 ENCOUNTER — Encounter: Payer: Self-pay | Admitting: Physician Assistant

## 2017-09-12 MED ORDER — LEVOTHYROXINE SODIUM 112 MCG PO TABS: 112.0000 ug | ORAL_TABLET | Freq: Every day | ORAL | 0 refills | Status: DC

## 2017-09-19 DIAGNOSIS — H0012 Chalazion right lower eyelid: Secondary | ICD-10-CM | POA: Diagnosis not present

## 2017-09-19 DIAGNOSIS — H00032 Abscess of right lower eyelid: Secondary | ICD-10-CM | POA: Diagnosis not present

## 2017-09-26 DIAGNOSIS — H00032 Abscess of right lower eyelid: Secondary | ICD-10-CM | POA: Diagnosis not present

## 2017-09-26 DIAGNOSIS — H0012 Chalazion right lower eyelid: Secondary | ICD-10-CM | POA: Diagnosis not present

## 2017-10-03 DIAGNOSIS — E039 Hypothyroidism, unspecified: Secondary | ICD-10-CM | POA: Diagnosis not present

## 2017-10-03 DIAGNOSIS — E7800 Pure hypercholesterolemia, unspecified: Secondary | ICD-10-CM | POA: Diagnosis not present

## 2017-10-03 DIAGNOSIS — E109 Type 1 diabetes mellitus without complications: Secondary | ICD-10-CM | POA: Diagnosis not present

## 2017-10-10 ENCOUNTER — Other Ambulatory Visit: Payer: Self-pay | Admitting: Physician Assistant

## 2017-10-10 DIAGNOSIS — E7800 Pure hypercholesterolemia, unspecified: Secondary | ICD-10-CM | POA: Diagnosis not present

## 2017-10-10 DIAGNOSIS — Z9641 Presence of insulin pump (external) (internal): Secondary | ICD-10-CM | POA: Diagnosis not present

## 2017-10-10 DIAGNOSIS — E109 Type 1 diabetes mellitus without complications: Secondary | ICD-10-CM | POA: Diagnosis not present

## 2017-10-10 DIAGNOSIS — I10 Essential (primary) hypertension: Secondary | ICD-10-CM | POA: Diagnosis not present

## 2017-10-10 DIAGNOSIS — E039 Hypothyroidism, unspecified: Secondary | ICD-10-CM | POA: Diagnosis not present

## 2017-11-25 ENCOUNTER — Encounter: Payer: Self-pay | Admitting: Physician Assistant

## 2017-11-28 ENCOUNTER — Other Ambulatory Visit: Payer: Self-pay

## 2017-11-28 DIAGNOSIS — F439 Reaction to severe stress, unspecified: Secondary | ICD-10-CM

## 2017-11-28 DIAGNOSIS — I10 Essential (primary) hypertension: Secondary | ICD-10-CM

## 2017-11-28 MED ORDER — SERTRALINE HCL 50 MG PO TABS: 50.0000 mg | ORAL_TABLET | Freq: Every day | ORAL | 3 refills | Status: DC

## 2017-11-28 MED ORDER — POTASSIUM CHLORIDE CRYS ER 20 MEQ PO TBCR: 20.0000 meq | EXTENDED_RELEASE_TABLET | Freq: Two times a day (BID) | ORAL | 3 refills | Status: DC

## 2017-11-28 MED ORDER — LOSARTAN POTASSIUM 100 MG PO TABS: 100.0000 mg | ORAL_TABLET | Freq: Every day | ORAL | 0 refills | Status: DC

## 2017-11-28 MED ORDER — ALPRAZOLAM 1 MG PO TABS: ORAL_TABLET | ORAL | 0 refills | Status: DC

## 2017-11-28 MED ORDER — VITAMIN D 50 MCG (2000 UT) PO CAPS: 2000.0000 [IU] | ORAL_CAPSULE | Freq: Every day | ORAL | 3 refills | Status: DC

## 2017-11-28 NOTE — Telephone Encounter (Signed)
Regarding the vitamin D----Remove the prescription strength vitamin D from med list. Have her take over-the-counter vitamin D 2000 units daily.   Add this to med list. Regarding the Xanax-- refill is approved for #90+0.

## 2017-11-28 NOTE — Telephone Encounter (Signed)
My following prescriptions need a refill. [facility] [place]    Sertraline 50MG  Tab QTY 90  Losartan Potassium Tab 100MG  Qty 90   Ergocalciferol Cap [id number] UNI Qty 12  Alprazolam 1MG  Tab Act Qty 30  Klor-Con M20 ER 20MEQ Tab SAN Qty 180    Thank you. These have my previous PCP and need a new script from Dr. Durwin Noraixon.    Ashley BellowsKathryn [name] DOB 15-Aug-1951 [phone]   Patient is taking 50,000 units of vitamin D should she decrease the dose?  Last OV 06/30/2017 Ok to refill Alprazolam?

## 2017-11-28 NOTE — Telephone Encounter (Signed)
Rx sent to pharmacy patient aware via mychart

## 2017-12-01 ENCOUNTER — Telehealth: Payer: Self-pay

## 2017-12-01 NOTE — Telephone Encounter (Signed)
Will fax information back to [facility]

## 2017-12-01 NOTE — Telephone Encounter (Signed)
Yes.  Continue current medications the same. These medications have been prescribed by specialists.  Potassium was normal on lab.

## 2017-12-01 NOTE — Telephone Encounter (Signed)
Received fax from pharmacy that patient is taking spironolactone as well as potassium chloride 20 meq and there is a risk for Hyperkalemia and would like to know if patient should continue to take the medications? Pls advise

## 2017-12-19 ENCOUNTER — Other Ambulatory Visit: Payer: Self-pay | Admitting: Physician Assistant

## 2017-12-29 ENCOUNTER — Ambulatory Visit: Payer: Medicare Other | Admitting: Physician Assistant

## 2018-01-09 ENCOUNTER — Ambulatory Visit (INDEPENDENT_AMBULATORY_CARE_PROVIDER_SITE_OTHER): Payer: Medicare Other | Admitting: Physician Assistant

## 2018-01-09 ENCOUNTER — Encounter: Payer: Self-pay | Admitting: Physician Assistant

## 2018-01-09 VITALS — BP 142/70 | HR 68 | Temp 98.1°F | Resp 16 | Ht 63.0 in | Wt 170.6 lb

## 2018-01-09 DIAGNOSIS — G4733 Obstructive sleep apnea (adult) (pediatric): Secondary | ICD-10-CM

## 2018-01-09 DIAGNOSIS — Z794 Long term (current) use of insulin: Secondary | ICD-10-CM | POA: Diagnosis not present

## 2018-01-09 DIAGNOSIS — I351 Nonrheumatic aortic (valve) insufficiency: Secondary | ICD-10-CM | POA: Diagnosis not present

## 2018-01-09 DIAGNOSIS — E7800 Pure hypercholesterolemia, unspecified: Secondary | ICD-10-CM | POA: Diagnosis not present

## 2018-01-09 DIAGNOSIS — I471 Supraventricular tachycardia: Secondary | ICD-10-CM | POA: Diagnosis not present

## 2018-01-09 DIAGNOSIS — K219 Gastro-esophageal reflux disease without esophagitis: Secondary | ICD-10-CM | POA: Diagnosis not present

## 2018-01-09 DIAGNOSIS — E559 Vitamin D deficiency, unspecified: Secondary | ICD-10-CM | POA: Diagnosis not present

## 2018-01-09 DIAGNOSIS — J301 Allergic rhinitis due to pollen: Secondary | ICD-10-CM | POA: Diagnosis not present

## 2018-01-09 DIAGNOSIS — E785 Hyperlipidemia, unspecified: Secondary | ICD-10-CM | POA: Diagnosis not present

## 2018-01-09 DIAGNOSIS — F419 Anxiety disorder, unspecified: Secondary | ICD-10-CM

## 2018-01-09 DIAGNOSIS — J4520 Mild intermittent asthma, uncomplicated: Secondary | ICD-10-CM | POA: Diagnosis not present

## 2018-01-09 DIAGNOSIS — J309 Allergic rhinitis, unspecified: Secondary | ICD-10-CM | POA: Insufficient documentation

## 2018-01-09 DIAGNOSIS — E1140 Type 2 diabetes mellitus with diabetic neuropathy, unspecified: Secondary | ICD-10-CM | POA: Diagnosis not present

## 2018-01-09 DIAGNOSIS — I341 Nonrheumatic mitral (valve) prolapse: Secondary | ICD-10-CM

## 2018-01-09 DIAGNOSIS — Z9989 Dependence on other enabling machines and devices: Secondary | ICD-10-CM

## 2018-01-09 DIAGNOSIS — E039 Hypothyroidism, unspecified: Secondary | ICD-10-CM | POA: Diagnosis not present

## 2018-01-09 DIAGNOSIS — J45909 Unspecified asthma, uncomplicated: Secondary | ICD-10-CM

## 2018-01-09 DIAGNOSIS — Z9641 Presence of insulin pump (external) (internal): Secondary | ICD-10-CM | POA: Diagnosis not present

## 2018-01-09 DIAGNOSIS — F329 Major depressive disorder, single episode, unspecified: Secondary | ICD-10-CM

## 2018-01-09 DIAGNOSIS — I10 Essential (primary) hypertension: Secondary | ICD-10-CM | POA: Diagnosis not present

## 2018-01-09 DIAGNOSIS — E109 Type 1 diabetes mellitus without complications: Secondary | ICD-10-CM | POA: Diagnosis not present

## 2018-01-09 LAB — COMPLETE METABOLIC PANEL WITH GFR
AG RATIO: 1.4 (calc) (ref 1.0–2.5)
ALT: 14 U/L (ref 6–29)
AST: 19 U/L (ref 10–35)
Albumin: 4.1 g/dL (ref 3.6–5.1)
Alkaline phosphatase (APISO): 67 U/L (ref 33–130)
BUN: 15 mg/dL (ref 7–25)
CALCIUM: 9.8 mg/dL (ref 8.6–10.4)
CHLORIDE: 99 mmol/L (ref 98–110)
CO2: 27 mmol/L (ref 20–32)
Creat: 0.92 mg/dL (ref 0.50–0.99)
GFR, EST NON AFRICAN AMERICAN: 64 mL/min/{1.73_m2} (ref >=60)
GFR, Est African American: 74 mL/min/{1.73_m2} (ref >=60)
GLUCOSE: 206 mg/dL — AB (ref 65–99)
Globulin: 2.9 g/dL (calc) (ref 1.9–3.7)
Potassium: 4.3 mmol/L (ref 3.5–5.3)
Sodium: 137 mmol/L (ref 135–146)
TOTAL PROTEIN: 7 g/dL (ref 6.1–8.1)
Total Bilirubin: 0.7 mg/dL (ref 0.2–1.2)

## 2018-01-09 LAB — EXTRA LAV TOP TUBE

## 2018-01-09 LAB — LIPID PANEL
Cholesterol: 147 mg/dL (ref <200)
HDL: 40 mg/dL — AB (ref >50)
LDL CHOLESTEROL (CALC): 74 mg/dL
Non-HDL Cholesterol (Calc): 107 mg/dL (calc) (ref <130)
Total CHOL/HDL Ratio: 3.7 (calc) (ref <5.0)
Triglycerides: 252 mg/dL — ABNORMAL HIGH (ref <150)

## 2018-01-09 LAB — TSH: TSH: 1.82 mIU/L (ref 0.40–4.50)

## 2018-01-09 MED ORDER — ALBUTEROL SULFATE HFA 108 (90 BASE) MCG/ACT IN AERS: 2.0000 | INHALATION_SPRAY | Freq: Four times a day (QID) | RESPIRATORY_TRACT | 11 refills | Status: AC | PRN

## 2018-01-09 MED ORDER — LEVOCETIRIZINE DIHYDROCHLORIDE 5 MG PO TABS: 5.0000 mg | ORAL_TABLET | Freq: Every evening | ORAL | 11 refills | Status: DC

## 2018-01-09 MED ORDER — ALBUTEROL SULFATE (2.5 MG/3ML) 0.083% IN NEBU: 2.5000 mg | INHALATION_SOLUTION | Freq: Four times a day (QID) | RESPIRATORY_TRACT | 1 refills | Status: DC | PRN

## 2018-01-09 NOTE — Addendum Note (Signed)
Addended by: Allayne Butcher B on: 01/09/2018 08:48 AM   Modules accepted: Orders

## 2018-01-09 NOTE — Progress Notes (Addendum)
Patient ID: PRISCILA BEAN MRN: [id number], DOB: 1951-07-15, 67 y.o. Date of Encounter: _0 @  Chief Complaint:  No chief complaint on file.   HPI: 67 y.o. year old female     06/30/2017: presents as a New Patient 67 to [facility].   When I enter the room she asked if I am planning to retire or quit working here any time soon. Says that she had been seeing Dr. Everlene Farrier for 30 years and then he retired.  She then saw Dr. Sheryn Bison with [facility] and he retired.  Then had a hard time finding anyone that excepted Medicare etc.  Says that Dr. Einar Gip recommended her coming here and that this location is good for her -- she lives near [employer].  She sees the following specialists on a routine basis:  She sees Dr. Soyla Murphy for type 2 insulin-dependent diabetes mellitus every 3 months. She is on an insulin pump.  She sees Dr. Einar Gip routinely. Says that she had been seeing him every 6 months and then he recently let her go to seeing him just once a year--  says things are now stable/controlled. She has a history of SVT, hypertension, aortic regurgitation and mitral valve prolapse. All of this information is reported from the patient. I do not have records from Dr. Einar Gip.  She states that she has "memorized that list"  but she also brings in a typed print out of information with her today. She is extremely organized----she has a case that contains all of her medication bottles, list of he medical providers and print out of her medical information.  She reports that she has her colonoscopy with [facility] -- Dr. Henrene Pastor.  Reports that she has her mammograms at [facility].  She has no specific concerns to address today. All of her current medical problems are stable and controlled with current medications.  AT THAT OV:  Checked TSH as she was on levothyroxine Checked FLP/LFT as she was on atorvastatin 40 mg Checked vitamin D level as she was on high dose vitamin D  supplement    01/09/2018: Today she brings in a copy of her advanced directive for [place] to have on file/part of her chart.  I will send this to scan. Today she says that her allergies have been giving her a problem.  Says that her eyes feel itchy and watery.  Says that she has been on the Zyrtec for many years and does not think it is working anymore. Otherwise she says that things have been stable.  She actually just got back from a [place] cruise yesterday. Asked about her current dose of vitamin D.  In the past she had been on the 50,000 units weekly.  She says that since she finished the prescription she has been taking the over-the-counter 2000 units daily. She continues to take her thyroid medication daily. Is taking the atorvastatin 40 mg daily.  No myalgias or other adverse effects. Taking all blood pressure medications as directed with no lightheadedness or other adverse effects. She says she definitely needs to take the Zoloft.  States that she definitely needs to continue this to help with anxiety and depression. No other specific concerns today other than just her allergies.   Addendum added 06/22/2018: Received note from Dr. Einar Gip dated 06/19/2018. That note reported that she was there for annual follow-up.   Denied any cardiac symptoms and reported that she was doing well and remains asymptomatic except for mild chronic dyspnea on exertion that  was unchanged.   Also continued to have approximately 2 episodes of PSVT a month that she was able to terminate with vagal maneuvers.   No new concerns at that office visit and was tolerating meds well. Note documents results from an exercise Myoview stress 09/02/2014. Note also documents echocardiogram findings from 09/04/2014. Noted that blood pressure was also stable.   He made no changes in medications at that visit and plan for follow-up in 1 year or sooner if needed.   Past Medical History:  Diagnosis Date  . Anxiety   . Asthma    . Depression   . Depression   . Diabetes mellitus   . Fibromyalgia   . GERD (gastroesophageal reflux disease)   . Goiter   . Hair loss   . Hypertension   . Menopause   . Other and unspecified hyperlipidemia   . Positive PPD   . SVT (supraventricular tachycardia) ([place])   . Vasculitis ([place])      Home Meds: Outpatient Medications Prior to Visit  Medication Sig Dispense Refill  . albuterol (PROVENTIL HFA;VENTOLIN HFA) 108 (90 BASE) MCG/ACT inhaler Inhale 2 puffs into the lungs every 6 (six) hours as needed. 8.5 g 11  . albuterol (PROVENTIL) (2.5 MG/3ML) 0.083% nebulizer solution Take 3 mLs (2.5 mg total) by nebulization every 6 (six) hours as needed. 75 mL 1  . ALPRAZolam (XANAX) 1 MG tablet TAKE ONE-HALF TABLET BY MOUTH ONCE DAILY AS NEEDED FOR STRESS 90 tablet 0  . amLODipine (NORVASC) 10 MG tablet One a day 30 tablet 11  . aspirin 81 MG tablet Take 81 mg by mouth daily.    Marland Kitchen atenolol (TENORMIN) 25 MG tablet Take 1 tablet (25 mg total) by mouth 2 (two) times daily. 180 tablet 3  . atorvastatin (LIPITOR) 40 MG tablet Take 1 tablet (40 mg total) daily by mouth. 90 tablet 3  . augmented betamethasone dipropionate (DIPROLENE-AF) 0.05 % ointment Apply topically 2 (two) times daily. 30 g 5  . BIOTIN PO Take 10,000 mcg by mouth daily.     . cetirizine (ZYRTEC) 10 MG tablet Take 10 mg by mouth daily.    . Cholecalciferol (VITAMIN D) 2000 units CAPS Take 1 capsule (2,000 Units total) by mouth daily. 30 capsule 3  . CINNAMON PO Take 1,000 mg by mouth 2 (two) times daily.    Marland Kitchen Dextrose, Diabetic Use, (GLUCOSE PO) Take by mouth as needed.     . gabapentin (NEURONTIN) 100 MG capsule TAKE THREE CAPSULES BY MOUTH IN THE MORNING AND SIX CAPSULES AT NIGHT 270 capsule 11  . hydrochlorothiazide (HYDRODIURIL) 25 MG tablet Take 25 mg by mouth daily.    Marland Kitchen levothyroxine (SYNTHROID, LEVOTHROID) 112 MCG tablet Take 1 tablet (112 mcg total) by mouth daily. 90 tablet 0  . levothyroxine (SYNTHROID,  LEVOTHROID) 125 MCG tablet Take 1 tablet (125 mcg total) by mouth daily. 30 tablet 11  . losartan (COZAAR) 100 MG tablet Take 1 tablet (100 mg total) by mouth daily. 90 tablet 0  . magnesium oxide (MAG-OX) 400 MG tablet Take 400 mg by mouth daily.    . Multiple Vitamins-Minerals (MULTIVITAMIN WITH MINERALS) tablet Take 1 tablet by mouth daily.    . naproxen sodium (ANAPROX) 220 MG tablet Take 220 mg by mouth as needed. Reported on 03/04/2016    . NOVOLIN R RELION 100 UNIT/ML injection     . potassium chloride SA (K-DUR,KLOR-CON) 20 MEQ tablet Take 1 tablet (20 mEq total) by mouth 2 (two) times daily.  180 tablet 3  . Probiotic Product (PROBIOTIC DAILY PO) Take by mouth.    Marland Kitchen rOPINIRole (REQUIP) 0.25 MG tablet TAKE 1 TABLET BY MOUTH AT BEDTIME 90 tablet 1  . sertraline (ZOLOFT) 50 MG tablet Take 1 tablet (50 mg total) by mouth daily. 90 tablet 3  . spironolactone (ALDACTONE) 50 MG tablet Take 50 mg by mouth daily.    . TRETIN-X 0.025 % CREAM KIT APPLY ONCE DAILY AS INSTRUCTED 1 kit 10  . tretinoin (RETIN-A) 0.025 % cream Apply topically at bedtime. 45 g 5  . valsartan-hydrochlorothiazide (DIOVAN-HCT) 320-12.5 MG tablet TAKE ONE TABLET BY MOUTH ONCE DAILY 30 tablet 0   No facility-administered medications prior to visit.     Allergies:  Allergies  Allergen Reactions  . Penicillins Anaphylaxis  . Ace Inhibitors     INDUCED BRONCHOSPASM    Social History   Socioeconomic History  . Marital status: Married    Spouse name: Not on file  . Number of children: Not on file  . Years of education: Not on file  . Highest education level: Not on file  Occupational History  . Not on file  Social Needs  . Financial resource strain: Not on file  . Food insecurity:    Worry: Not on file    Inability: Not on file  . Transportation needs:    Medical: Not on file    Non-medical: Not on file  Tobacco Use  . Smoking status: Former Smoker    Years: 15.00    Types: Cigarettes    Last attempt to  quit: 03/13/1988    Years since quitting: 29.8  . Smokeless tobacco: Never Used  Substance and Sexual Activity  . Alcohol use: No  . Drug use: No  . Sexual activity: Not on file  Lifestyle  . Physical activity:    Days per week: Not on file    Minutes per session: Not on file  . Stress: Not on file  Relationships  . Social connections:    Talks on phone: Not on file    Gets together: Not on file    Attends religious service: Not on file    Active member of club or organization: Not on file    Attends meetings of clubs or organizations: Not on file    Relationship status: Not on file  . Intimate partner violence:    Fear of current or ex partner: Not on file    Emotionally abused: Not on file    Physically abused: Not on file    Forced sexual activity: Not on file  Other Topics Concern  . Not on file  Social History Narrative   Caffeine 1-2 cups coffee daily.   Lives at home with husband and daughter.   Retired.   Associates degree.   Has 4 kids.    Family History  Problem Relation Age of Onset  . Hepatitis Mother 96       died from [place]  . Diabetes Father   . Cancer Brother   . Crohn's disease Brother   . Alcohol abuse Brother      Review of Systems:  See HPI for pertinent ROS. All other ROS negative.    Physical Exam: Blood pressure (!) 142/70, pulse 68, temperature 98.1 F (36.7 C), temperature source Oral, resp. rate 16, height '5\' 3"'$  (1.6 m), weight 77.4 kg (170 lb 9.6 oz), SpO2 96 %., There is no height or weight on file to calculate BMI. General: WNWD  WF. Appears in no acute distress. Neck: Supple. No thyromegaly. No lymphadenopathy. Radiation of murmur to neck vs carotid bruit Lungs: Clear bilaterally to auscultation without wheezes, rales, or rhonchi. Breathing is unlabored. Heart: RRR.  III / VI murmur at right 2nd ICS Abdomen: Soft, non-tender, non-distended with normoactive bowel sounds. No hepatomegaly. No rebound/guarding. No obvious abdominal  masses. Musculoskeletal:  Strength and tone normal for age. Extremities/Skin: Warm and dry.  No LE edema.  Neuro: Alert and oriented X 3. Moves all extremities spontaneously. Gait is normal. CNII-XII grossly in tact. Psych:  Responds to questions appropriately with a normal affect.     ASSESSMENT AND PLAN:  67 y.o. year old female with    Mild intermittent chronic asthma without complication 9/60/4540:  Stable/controlled. Continue current medications.  OSA on CPAP 01/09/2018:  Stable/controlled. Continue CPAP.  Gastroesophageal reflux disease, esophagitis presence not specified 01/09/2018:  Stable/controlled. Continue current medication.  Hypothyroidism, unspecified type 01/09/2018:  She is on levothyroxine. Check lab to monitor.      - TSH  Anxiety and depression 01/09/2018:  Stable/controlled. Continue current medication.  Vitamin D deficiency 01/09/2018:  In the past she was on Rx strength 50,000 units once a week for 12 weeks. She says that since then--she has been taking 2,000 units daily.   Vitamin D level at lab 06/30/2017 and vitamin D was 41.  Therefore will not recheck lab at this time.  Continue current dose vitamin D. Hyperlipidemia, unspecified hyperlipidemia type 01/09/2018:  She is on atorvastatin 38m daily. She is fasting. Check labs to monitor.     - COMPLETE METABOLIC PANEL WITH GFR    - Lipid panel  Aortic valve insufficiency, etiology of cardiac valve disease unspecified 01/09/2018:  Managed by Dr. GEinar Gip Mitral valve prolapse 01/09/2018:  Managed by Dr. GEinar Gip Diabetic polyneuropathy associated with diabetes mellitus due to underlying condition ([place] 01/09/2018:  Stable/controlled on current medication.  Seasonal allergic rhinitis due to pollen 01/09/2018: Will change Zyrtec to Zyxal.  See if symptoms improve with this change.  I told her that if symptoms are not controlled with this then call me and I will add Singulair. - levocetirizine (XYZAL ALLERGY  24HR) 5 MG tablet; Take 1 tablet (5 mg total) by mouth every evening.  Dispense: 30 tablet; Refill: 11    Regarding preventative care---- she reports the following: States that her tetanus is current, has received hepatitis B (3 of 3),has received Prevnar pneumonia vaccine 09/18/2014, flu vaccine 06/15/2017, shingles vaccine 05/2017. Also colonoscopy and mammogram are up-to-date.  Reviewed her chart.  She had DEXA scan 04/28/2015 that was normal.  T score -0.6.  Given that this looks like good, can wait to recheck DEXA.  Today her blood pressure is excellent. Will check lab to monitor. Will plan for routine follow-up visit in 6 months. Follow-up sooner if needed.  SMarin OlpDPax[place] PUtah [facility]4/29/2019 7:58 AM

## 2018-01-10 ENCOUNTER — Encounter: Payer: Self-pay | Admitting: Physician Assistant

## 2018-01-10 DIAGNOSIS — J45909 Unspecified asthma, uncomplicated: Secondary | ICD-10-CM

## 2018-01-10 MED ORDER — ALBUTEROL SULFATE (2.5 MG/3ML) 0.083% IN NEBU: 2.5000 mg | INHALATION_SOLUTION | Freq: Four times a day (QID) | RESPIRATORY_TRACT | 1 refills | Status: AC | PRN

## 2018-01-17 ENCOUNTER — Ambulatory Visit: Payer: Medicare Other | Admitting: Neurology

## 2018-01-27 ENCOUNTER — Ambulatory Visit (INDEPENDENT_AMBULATORY_CARE_PROVIDER_SITE_OTHER): Payer: Medicare Other | Admitting: Neurology

## 2018-01-27 ENCOUNTER — Encounter: Payer: Self-pay | Admitting: Neurology

## 2018-01-27 VITALS — BP 123/62 | HR 53 | Ht 63.0 in | Wt 176.0 lb

## 2018-01-27 DIAGNOSIS — Z9989 Dependence on other enabling machines and devices: Secondary | ICD-10-CM

## 2018-01-27 DIAGNOSIS — E1142 Type 2 diabetes mellitus with diabetic polyneuropathy: Secondary | ICD-10-CM

## 2018-01-27 DIAGNOSIS — G4733 Obstructive sleep apnea (adult) (pediatric): Secondary | ICD-10-CM | POA: Diagnosis not present

## 2018-01-27 DIAGNOSIS — R351 Nocturia: Secondary | ICD-10-CM

## 2018-01-27 NOTE — Progress Notes (Signed)
PATIENT: [name] DOB: 05-Jul-1951  REASON FOR VISIT: follow up- OSA on CPAP HISTORY FROM: patient  HISTORY OF PRESENT ILLNESS:  01-27-2018, Patient of Dr. Einar Gip and [place].  Routine Rv for CPAP compliance for this 67 year old female patient, her CPAP download dated 24 Jan 2018 shows 97% compliance, with an average of 8 hours and 1 minute nightly use, her CPAP is an AutoSet but set at 6 cm water pressure with 3 cm EPR her residual AHI is 3.4.  There is no central apneas emerging.  There also no significant air leaks.  [name] does not endorse a high degree of fatigue or sleepiness, her fatigue severity score is endorsed at 18 points and her Epworth sleepiness score at 3 points.  She is also not significantly depressed, according to the geriatric depression score where she endorsed 2 out of 15 questions positive. She has still more than 3 bathroom breaks each night- related to DM or diuretic( HCTZ).     [name] is a 67 year old female with a history of osa on cpap. She returns today for a compliance download. Her download indicates that she used her machine 26/30 days for a compliance of 87%. Each night she uses her machine greater than 4 hours. On average she uses her machine 9 hours and 35 minutes. Her residual AHI is 2.4 cm of water with EPR of 3. She does not have a significant leak. Overall she is doing well. Denies daytime sleepiness. Denies any new neurological symptoms. She returns today for an evaluation.  HISTORY  [name] is a 67 year old female with a history of sleep apnea on CPAP. She returns today for a compliance download. Her download indicates that she uses her machine 30 out of 30 days for compliance of 100%. She uses her machine greater than 4 hours 27 out of 30 days for compliance of 90%. On average she uses her machine 6 hours and 55 minutes. Her residual AHI is 3.7 on 6 cm of water with EPR of 3. She does not have a significant leak. She  states that she cannot tolerate the mask that she was given. She has been using the nasal cannulas that her husband use. She states that this is been working well for her. Her Epworth sleepiness score is 1 and fatigue severity score is 36. She also states that she would like to change her DME company to [employer]. She returns today for an evaluation.  HISTORY (Geetika Laborde):[name] is a 67 y.o. female seen here as a referral from Dr. Einar Gip and [place] for a sleep evaluation.  [name] is a right-handed, ambi-dexterous, Caucasian female patient of Dr. Einar Gip. It was him who sent her here today for a sleep evaluation the patient reports that she has been known to snore and that she feels she sleeps deep and sound and heart". She endorses that she has a heart murmur often feels fatigued in daytime and sometimes has wheezing again snoring she has the environmental allergies and some component of restless legs is noted. She also feels often hot cold or has increased thirst and endorsed flushing. Already established our diagnoses of hypertension, diabetes, high cholesterol, heart disease, fibromyalgia, anxiety-depression and the patient has undergone a hysterectomy and cholecystectomy in the past. Dr. Willaim Bane reported that she has aortic and mitral valve leakage, and incomplete right bundle branch block, and an enlarged left atrium. She was referred to Dr. Michiel Sites recently for uncontrolled  diabetes.  The patient usually goes to bed about 10:30 sometimes she will read occasionally she will watch TV but this seems not to be a routine. Her bedroom is described as cool, quiet and dark. The couple sleeps in different bedrooms as a bolus find the partner snoring disturbing. She will wake up up to 4 or 5 times at night from her, some of these are nocturia is but mostly she goes to the bathroom because she is already awake. She will wake up about 7:30 usually without alarm. She estimates that her total sleep time is  6 hours or less overnight. She does not nap in daytime. Due to her condition of fibromyalgia she associates the discomfort often with sleep interruption. When she rises she will take coffee usually she has breakfast. She just is a decided to retire in December 2015. Her previous job was a daytime employment and she was self-employed as a Scientist, forensic and worked at [employer].She has no history of TBI, of skull trauma . She broke her nose once. No nasal septum deviation.   1) [name] is a patient with a chief complaint of nonrestorative sleep, and multiple factors play a role in this. There is a snoring,witnessed apnea -reported by her husband. There is less restful and less sound sleep due to underlying pain and discomfort, attributed to fibromyalgia.  2)There is sleep interruption from nocturia which can be diabetes related but nocturia can also occur in obstructive sleep apnea. The patient sometimes wakes up with headaches in the morning and she may revealed retain CO2 overnight which would lead to avascular dilation and a global headache but then clears as the day goes on. She is more prone to have this condition because she has a primary pulmonary abnormality with a diagnosis of asthma. 3)Her hypertension may be better controlled if apnea is found and treated. Hypoxemia and hypercapnia can also be treated with CPAP and often allow a patient continues restful and more sound sleep with less nocturia without a dry mouth and waking up without headaches.  The patient was advised of the nature of the diagnosed sleep disorder , the treatment options and risks for general a health and wellness arising from not treating the condition. Visit duration was 45 minutes.more than 50% of our face to face time was dedicated to the diagnosis discussion, explanation of the choice of tests and which treatments may result  REVIEW OF SYSTEMS: Out of a complete 14 system review of symptoms, the patient complains  only of the following symptoms, and all other reviewed systems are negative.  Restless leg  ALLERGIES: Allergies  Allergen Reactions  . Penicillins Anaphylaxis  . Ace Inhibitors     INDUCED BRONCHOSPASM    HOME MEDICATIONS: Outpatient Medications Prior to Visit  Medication Sig Dispense Refill  . albuterol (PROVENTIL HFA;VENTOLIN HFA) 108 (90 Base) MCG/ACT inhaler Inhale 2 puffs into the lungs every 6 (six) hours as needed. 8.5 g 11  . albuterol (PROVENTIL) (2.5 MG/3ML) 0.083% nebulizer solution Take 3 mLs (2.5 mg total) by nebulization every 6 (six) hours as needed. ICD10:J45.909 75 mL 1  . ALPRAZolam (XANAX) 1 MG tablet TAKE ONE-HALF TABLET BY MOUTH ONCE DAILY AS NEEDED FOR STRESS 90 tablet 0  . amLODipine (NORVASC) 10 MG tablet One a day 30 tablet 11  . aspirin 81 MG tablet Take 81 mg by mouth daily.    Marland Kitchen atenolol (TENORMIN) 25 MG tablet Take 1 tablet (25 mg total) by mouth 2 (two) times daily.  180 tablet 3  . atorvastatin (LIPITOR) 40 MG tablet Take 1 tablet (40 mg total) daily by mouth. 90 tablet 3  . augmented betamethasone dipropionate (DIPROLENE-AF) 0.05 % ointment Apply topically 2 (two) times daily. 30 g 5  . BIOTIN PO Take 10,000 mcg by mouth daily.     . Cholecalciferol (VITAMIN D) 2000 units CAPS Take 1 capsule (2,000 Units total) by mouth daily. 30 capsule 3  . CINNAMON PO Take 1,000 mg by mouth 2 (two) times daily.    Marland Kitchen Dextrose, Diabetic Use, (GLUCOSE PO) Take by mouth as needed.     . hydrochlorothiazide (HYDRODIURIL) 25 MG tablet Take 25 mg by mouth daily.    Marland Kitchen levocetirizine (XYZAL ALLERGY 24HR) 5 MG tablet Take 1 tablet (5 mg total) by mouth every evening. 30 tablet 11  . levothyroxine (SYNTHROID, LEVOTHROID) 112 MCG tablet Take 1 tablet (112 mcg total) by mouth daily. 90 tablet 0  . losartan (COZAAR) 100 MG tablet Take 1 tablet (100 mg total) by mouth daily. 90 tablet 0  . Multiple Vitamins-Minerals (MULTIVITAMIN WITH MINERALS) tablet Take 1 tablet by mouth daily.     . naproxen sodium (ANAPROX) 220 MG tablet Take 220 mg by mouth as needed. Reported on 03/04/2016    . NOVOLIN R RELION 100 UNIT/ML injection     . potassium chloride SA (K-DUR,KLOR-CON) 20 MEQ tablet Take 1 tablet (20 mEq total) by mouth 2 (two) times daily. 180 tablet 3  . Probiotic Product (PROBIOTIC DAILY PO) Take by mouth.    Marland Kitchen rOPINIRole (REQUIP) 0.25 MG tablet TAKE 1 TABLET BY MOUTH AT BEDTIME 90 tablet 1  . sertraline (ZOLOFT) 50 MG tablet Take 1 tablet (50 mg total) by mouth daily. 90 tablet 3  . TRETIN-X 0.025 % CREAM KIT APPLY ONCE DAILY AS INSTRUCTED 1 kit 10  . tretinoin (RETIN-A) 0.025 % cream Apply topically at bedtime. 45 g 5  . cetirizine (ZYRTEC) 10 MG tablet Take 10 mg by mouth daily.    . magnesium oxide (MAG-OX) 400 MG tablet Take 400 mg by mouth daily.     No facility-administered medications prior to visit.     PAST MEDICAL HISTORY: Past Medical History:  Diagnosis Date  . Anxiety   . Asthma   . Depression   . Depression   . Diabetes mellitus   . Fibromyalgia   . GERD (gastroesophageal reflux disease)   . Goiter   . Hair loss   . Hypertension   . Menopause   . Other and unspecified hyperlipidemia   . Positive PPD   . SVT (supraventricular tachycardia) ([place])   . Vasculitis ([place])     PAST SURGICAL HISTORY: Past Surgical History:  Procedure Laterality Date  . APPENDECTOMY    . CHOLECYSTECTOMY    . PARTIAL HYSTERECTOMY      FAMILY HISTORY: Family History  Problem Relation Age of Onset  . Hepatitis Mother 14       died from [place]  . Diabetes Father   . Cancer Brother   . Crohn's disease Brother   . Alcohol abuse Brother     SOCIAL HISTORY: Social History   Socioeconomic History  . Marital status: Married    Spouse name: Not on file  . Number of children: Not on file  . Years of education: Not on file  . Highest education level: Not on file  Occupational History  . Not on file  Social Needs  . Financial resource strain: Not on file  .  Food insecurity:    Worry: Not on file    Inability: Not on file  . Transportation needs:    Medical: Not on file    Non-medical: Not on file  Tobacco Use  . Smoking status: Former Smoker    Years: 15.00    Types: Cigarettes    Last attempt to quit: 03/13/1988    Years since quitting: 29.8  . Smokeless tobacco: Never Used  Substance and Sexual Activity  . Alcohol use: No  . Drug use: No  . Sexual activity: Not on file  Lifestyle  . Physical activity:    Days per week: Not on file    Minutes per session: Not on file  . Stress: Not on file  Relationships  . Social connections:    Talks on phone: Not on file    Gets together: Not on file    Attends religious service: Not on file    Active member of club or organization: Not on file    Attends meetings of clubs or organizations: Not on file    Relationship status: Not on file  . Intimate partner violence:    Fear of current or ex partner: Not on file    Emotionally abused: Not on file    Physically abused: Not on file    Forced sexual activity: Not on file  Other Topics Concern  . Not on file  Social History Narrative   Caffeine 1-2 cups coffee daily.   Lives at home with husband and daughter.   Retired.   Associates degree.   Has 4 kids.      PHYSICAL EXAM  Vitals:   01/27/18 0834  BP: 123/62  Pulse: (!) 53  Weight: 176 lb (79.8 kg)  Height: 5' 3"  (1.6 m)   Body mass index is 31.18 kg/m.   Neck circumference: 16,  Mallampati grade: 4  Generalized: Well developed, in no acute distress   Neurological examination  Mentation: Alert oriented to time, place, follows all commands, her  speech is clear and language fluent Cranial nerve : no changes in taste or smell- Pupils were equal round reactive to light. Extraocular movements were full, visual field were full on confrontational test. Facial sensation and strength were normal. Uvula and tongue midline. Head turning and shoulder shrug  were normal and  symmetric. Motor:  5 / 5 strength of all 4 extremities. Grip strength is bilaterally equal -symmetric motor tone is noted throughout.  Sensory:  "My hands  go to sleep" in daytime. Here intact to soft touch on upper extremities. No evidence of extinction is noted.  Coordination:  testing reveals good finger-nose bilaterally.  Gait and station: Gait is normal. Romberg is negative.  Reflexes: Deep tendon reflexes are symmetric and normal bilaterally.   DIAGNOSTIC DATA (LABS, IMAGING, TESTING) - I reviewed patient records, labs, notes, testing and imaging myself where available.  CPAOP download.    Lab Results  Component Value Date   WBC 6.6 08/28/2015   HGB 14.8 08/28/2015   HCT 44.5 08/28/2015   MCV 82.4 08/28/2015   PLT 224 08/28/2015      Component Value Date/Time   NA 137 01/09/2018 0823   K 4.3 01/09/2018 0823   CL 99 01/09/2018 0823   CO2 27 01/09/2018 0823   GLUCOSE 206 (H) 01/09/2018 0823   BUN 15 01/09/2018 0823   CREATININE 0.92 01/09/2018 0823   CALCIUM 9.8 01/09/2018 0823   PROT 7.0 01/09/2018 0823   ALBUMIN 4.2 08/28/2015 1050  AST 19 01/09/2018 0823   ALT 14 01/09/2018 0823   ALKPHOS 59 08/28/2015 1050   BILITOT 0.7 01/09/2018 0823   GFRNONAA 64 01/09/2018 0823   GFRAA 74 01/09/2018 0823   Lab Results  Component Value Date   CHOL 147 01/09/2018   HDL 40 (L) 01/09/2018   LDLCALC 74 01/09/2018   TRIG 252 (H) 01/09/2018   CHOLHDL 3.7 01/09/2018   Lab Results  Component Value Date   HGBA1C 6.7 12/20/2014   Lab Results  Component Value Date   VITAMINB12 626 10/21/2006   Lab Results  Component Value Date   TSH 1.82 01/09/2018      ASSESSMENT AND PLAN 67 y.o. year old female  has a past medical history of Anxiety, Asthma, Depression, Depression, Diabetes mellitus, Fibromyalgia, GERD (gastroesophageal reflux disease), Goiter, Hair loss, Hypertension, Menopause, Other and unspecified hyperlipidemia, Positive PPD, SVT (supraventricular tachycardia)  ([place]), and Vasculitis ([place]). here with:    1. OSA on CPAP, highly compliant   Overall the patients download shows excellence compliance and good treatment of apnea.   2.Remains with nocturia - likely related to either DM or HCTZ, which she takes in the morning.  3. No palpitations recently- much better heart rate control.   4. Vasculitic skin changes.    She is encouraged to continue using cpap nightly and for >4 hours each night. If her symptoms worsens or she develops new symptoms she should let us know. She will follow up in 1 year with either NP or me, Dr. Brett Fairy.   I spent 15 minutes with the patient 50 % of this time was spent reviewing CPAP download.    Larey Seat, MD  01/27/2018, 9:06 AM [facility] [address], [place],  [address] [phone]

## 2018-02-01 ENCOUNTER — Other Ambulatory Visit: Payer: Self-pay | Admitting: Physician Assistant

## 2018-02-09 ENCOUNTER — Encounter: Payer: Self-pay | Admitting: Physician Assistant

## 2018-02-09 ENCOUNTER — Telehealth: Payer: Self-pay | Admitting: *Deleted

## 2018-02-09 ENCOUNTER — Other Ambulatory Visit: Payer: Self-pay

## 2018-02-09 ENCOUNTER — Ambulatory Visit (HOSPITAL_COMMUNITY)
Admission: RE | Admit: 2018-02-09 | Discharge: 2018-02-09 | Disposition: A | Discharge status: Home / Self Care | Payer: Medicare Other | Source: Ambulatory Visit | Attending: Physician Assistant | Admitting: Physician Assistant

## 2018-02-09 ENCOUNTER — Ambulatory Visit (INDEPENDENT_AMBULATORY_CARE_PROVIDER_SITE_OTHER): Payer: Medicare Other | Admitting: Physician Assistant

## 2018-02-09 ENCOUNTER — Encounter (HOSPITAL_COMMUNITY): Payer: Self-pay | Admitting: Emergency Medicine

## 2018-02-09 ENCOUNTER — Inpatient Hospital Stay (HOSPITAL_COMMUNITY)
Admission: EM | Admit: 2018-02-09 | Discharge: 2018-02-13 | DRG: 389 | Disposition: A | Discharge status: Home / Self Care | Payer: Medicare Other | Attending: Internal Medicine | Admitting: Internal Medicine

## 2018-02-09 VITALS — BP 124/78 | HR 51 | Temp 98.3°F | Resp 14 | Ht 63.0 in | Wt 170.2 lb

## 2018-02-09 DIAGNOSIS — Z888 Allergy status to other drugs, medicaments and biological substances status: Secondary | ICD-10-CM | POA: Diagnosis not present

## 2018-02-09 DIAGNOSIS — E039 Hypothyroidism, unspecified: Secondary | ICD-10-CM | POA: Diagnosis present

## 2018-02-09 DIAGNOSIS — R112 Nausea with vomiting, unspecified: Secondary | ICD-10-CM

## 2018-02-09 DIAGNOSIS — K5651 Intestinal adhesions [bands], with partial obstruction: Principal | ICD-10-CM | POA: Diagnosis present

## 2018-02-09 DIAGNOSIS — E11649 Type 2 diabetes mellitus with hypoglycemia without coma: Secondary | ICD-10-CM | POA: Diagnosis present

## 2018-02-09 DIAGNOSIS — Z9641 Presence of insulin pump (external) (internal): Secondary | ICD-10-CM | POA: Diagnosis present

## 2018-02-09 DIAGNOSIS — R1013 Epigastric pain: Secondary | ICD-10-CM | POA: Diagnosis not present

## 2018-02-09 DIAGNOSIS — K56609 Unspecified intestinal obstruction, unspecified as to partial versus complete obstruction: Secondary | ICD-10-CM | POA: Diagnosis not present

## 2018-02-09 DIAGNOSIS — Z0189 Encounter for other specified special examinations: Secondary | ICD-10-CM

## 2018-02-09 DIAGNOSIS — E1140 Type 2 diabetes mellitus with diabetic neuropathy, unspecified: Secondary | ICD-10-CM | POA: Diagnosis present

## 2018-02-09 DIAGNOSIS — Z9989 Dependence on other enabling machines and devices: Secondary | ICD-10-CM | POA: Diagnosis not present

## 2018-02-09 DIAGNOSIS — F32A Depression, unspecified: Secondary | ICD-10-CM | POA: Diagnosis present

## 2018-02-09 DIAGNOSIS — G4733 Obstructive sleep apnea (adult) (pediatric): Secondary | ICD-10-CM | POA: Diagnosis present

## 2018-02-09 DIAGNOSIS — Z87891 Personal history of nicotine dependence: Secondary | ICD-10-CM

## 2018-02-09 DIAGNOSIS — E785 Hyperlipidemia, unspecified: Secondary | ICD-10-CM | POA: Diagnosis present

## 2018-02-09 DIAGNOSIS — K56600 Partial intestinal obstruction, unspecified as to cause: Secondary | ICD-10-CM

## 2018-02-09 DIAGNOSIS — M797 Fibromyalgia: Secondary | ICD-10-CM | POA: Diagnosis present

## 2018-02-09 DIAGNOSIS — R630 Anorexia: Secondary | ICD-10-CM

## 2018-02-09 DIAGNOSIS — I10 Essential (primary) hypertension: Secondary | ICD-10-CM | POA: Diagnosis not present

## 2018-02-09 DIAGNOSIS — J45909 Unspecified asthma, uncomplicated: Secondary | ICD-10-CM | POA: Diagnosis present

## 2018-02-09 DIAGNOSIS — Z880 Allergy status to penicillin: Secondary | ICD-10-CM

## 2018-02-09 DIAGNOSIS — Z79899 Other long term (current) drug therapy: Secondary | ICD-10-CM | POA: Diagnosis not present

## 2018-02-09 DIAGNOSIS — I471 Supraventricular tachycardia: Secondary | ICD-10-CM | POA: Diagnosis not present

## 2018-02-09 DIAGNOSIS — F419 Anxiety disorder, unspecified: Secondary | ICD-10-CM | POA: Diagnosis present

## 2018-02-09 DIAGNOSIS — I440 Atrioventricular block, first degree: Secondary | ICD-10-CM | POA: Diagnosis present

## 2018-02-09 DIAGNOSIS — K838 Other specified diseases of biliary tract: Secondary | ICD-10-CM | POA: Diagnosis not present

## 2018-02-09 DIAGNOSIS — Z4659 Encounter for fitting and adjustment of other gastrointestinal appliance and device: Secondary | ICD-10-CM | POA: Diagnosis not present

## 2018-02-09 DIAGNOSIS — E108 Type 1 diabetes mellitus with unspecified complications: Secondary | ICD-10-CM

## 2018-02-09 DIAGNOSIS — Z7982 Long term (current) use of aspirin: Secondary | ICD-10-CM

## 2018-02-09 DIAGNOSIS — E119 Type 2 diabetes mellitus without complications: Secondary | ICD-10-CM

## 2018-02-09 DIAGNOSIS — F329 Major depressive disorder, single episode, unspecified: Secondary | ICD-10-CM | POA: Diagnosis present

## 2018-02-09 DIAGNOSIS — J4520 Mild intermittent asthma, uncomplicated: Secondary | ICD-10-CM | POA: Diagnosis not present

## 2018-02-09 DIAGNOSIS — R1033 Periumbilical pain: Secondary | ICD-10-CM | POA: Diagnosis not present

## 2018-02-09 HISTORY — DX: Unspecified intestinal obstruction, unspecified as to partial versus complete obstruction: K56.609

## 2018-02-09 LAB — URINALYSIS, ROUTINE W REFLEX MICROSCOPIC
Bilirubin Urine: NEGATIVE
Glucose, UA: 50 mg/dL — AB
Hgb urine dipstick: NEGATIVE
KETONES UR: NEGATIVE mg/dL
LEUKOCYTES UA: NEGATIVE
NITRITE: NEGATIVE
PH: 7 (ref 5.0–8.0)
PROTEIN: NEGATIVE mg/dL
Specific Gravity, Urine: 1.019 (ref 1.005–1.030)

## 2018-02-09 LAB — CBC WITH DIFFERENTIAL/PLATELET
BASOS ABS: 0 10*3/uL (ref 0.0–0.1)
BASOS PCT: 0 %
Eosinophils Absolute: 0.1 10*3/uL (ref 0.0–0.7)
Eosinophils Relative: 1 %
HEMATOCRIT: 44.9 % (ref 36.0–46.0)
Hemoglobin: 14.9 g/dL (ref 12.0–15.0)
Lymphocytes Relative: 21 %
Lymphs Abs: 2.5 10*3/uL (ref 0.7–4.0)
MCH: 28.5 pg (ref 26.0–34.0)
MCHC: 33.2 g/dL (ref 30.0–36.0)
MCV: 86 fL (ref 78.0–100.0)
MONO ABS: 0.8 10*3/uL (ref 0.1–1.0)
Monocytes Relative: 7 %
NEUTROS ABS: 8.5 10*3/uL — AB (ref 1.7–7.7)
NEUTROS PCT: 71 %
Platelets: 242 10*3/uL (ref 150–400)
RBC: 5.22 MIL/uL — ABNORMAL HIGH (ref 3.87–5.11)
RDW: 14.2 % (ref 11.5–15.5)
WBC: 12 10*3/uL — ABNORMAL HIGH (ref 4.0–10.5)

## 2018-02-09 LAB — COMPREHENSIVE METABOLIC PANEL
ALT: 15 U/L (ref 14–54)
ANION GAP: 9 (ref 5–15)
AST: 21 U/L (ref 15–41)
Albumin: 4.3 g/dL (ref 3.5–5.0)
Alkaline Phosphatase: 58 U/L (ref 38–126)
BILIRUBIN TOTAL: 0.7 mg/dL (ref 0.3–1.2)
BUN: 17 mg/dL (ref 6–20)
CO2: 29 mmol/L (ref 22–32)
Calcium: 9.8 mg/dL (ref 8.9–10.3)
Chloride: 101 mmol/L (ref 101–111)
Creatinine, Ser: 0.91 mg/dL (ref 0.44–1.00)
GFR calc Af Amer: >60 mL/min (ref >60)
Glucose, Bld: 71 mg/dL (ref 65–99)
Potassium: 4.6 mmol/L (ref 3.5–5.1)
SODIUM: 139 mmol/L (ref 135–145)
TOTAL PROTEIN: 8 g/dL (ref 6.5–8.1)

## 2018-02-09 LAB — LIPASE, BLOOD: LIPASE: 23 U/L (ref 11–51)

## 2018-02-09 LAB — CBG MONITORING, ED
GLUCOSE-CAPILLARY: 82 mg/dL (ref 65–99)
GLUCOSE-CAPILLARY: 54 mg/dL — AB (ref 65–99)
GLUCOSE-CAPILLARY: 103 mg/dL — AB (ref 65–99)

## 2018-02-09 LAB — GLUCOSE, CAPILLARY: GLUCOSE-CAPILLARY: 101 mg/dL — AB (ref 65–99)

## 2018-02-09 MED ORDER — HYDRALAZINE HCL 20 MG/ML IJ SOLN: 10.0000 mg | Freq: Four times a day (QID) | INTRAMUSCULAR | Status: DC | PRN

## 2018-02-09 MED ORDER — HYDROMORPHONE HCL 1 MG/ML IJ SOLN
0.5000 mg | INTRAMUSCULAR | Status: DC | PRN
  Administered 2018-02-09 – 2018-02-11 (×9): 0.5 mg via INTRAVENOUS
  Filled 2018-02-09 (×8): qty 0.5
  Filled 2018-02-09: qty 1

## 2018-02-09 MED ORDER — SODIUM CHLORIDE 0.9 % IV BOLUS
1000.0000 mL | Freq: Once | INTRAVENOUS | Status: AC
  Administered 2018-02-09: 1000 mL via INTRAVENOUS

## 2018-02-09 MED ORDER — INSULIN ASPART 100 UNIT/ML ~~LOC~~ SOLN
0.0000 [IU] | SUBCUTANEOUS | Status: DC
  Administered 2018-02-10: 1 [IU] via SUBCUTANEOUS
  Administered 2018-02-10: 2 [IU] via SUBCUTANEOUS
  Administered 2018-02-10 – 2018-02-11 (×2): 1 [IU] via SUBCUTANEOUS
  Administered 2018-02-11: 2 [IU] via SUBCUTANEOUS

## 2018-02-09 MED ORDER — IOPAMIDOL (ISOVUE-300) INJECTION 61%
INTRAVENOUS | Status: AC
  Filled 2018-02-09: qty 100

## 2018-02-09 MED ORDER — IOPAMIDOL (ISOVUE-300) INJECTION 61%: 30.0000 mL | Freq: Once | INTRAVENOUS | Status: DC | PRN

## 2018-02-09 MED ORDER — SODIUM CHLORIDE 0.9 % IV SOLN
INTRAVENOUS | Status: AC
  Administered 2018-02-09: 22:00:00 via INTRAVENOUS

## 2018-02-09 MED ORDER — ONDANSETRON HCL 4 MG PO TABS: 4.0000 mg | ORAL_TABLET | Freq: Four times a day (QID) | ORAL | Status: DC | PRN

## 2018-02-09 MED ORDER — ONDANSETRON HCL 4 MG/2ML IJ SOLN
4.0000 mg | Freq: Once | INTRAMUSCULAR | Status: AC
  Administered 2018-02-09: 4 mg via INTRAVENOUS
  Filled 2018-02-09: qty 2

## 2018-02-09 MED ORDER — IOPAMIDOL (ISOVUE-300) INJECTION 61%
INTRAVENOUS | Status: AC
  Filled 2018-02-09: qty 30

## 2018-02-09 MED ORDER — MORPHINE SULFATE (PF) 4 MG/ML IV SOLN
4.0000 mg | Freq: Once | INTRAVENOUS | Status: AC
  Administered 2018-02-09: 4 mg via INTRAVENOUS
  Filled 2018-02-09: qty 1

## 2018-02-09 MED ORDER — ONDANSETRON HCL 4 MG/2ML IJ SOLN
4.0000 mg | Freq: Four times a day (QID) | INTRAMUSCULAR | Status: DC | PRN
  Administered 2018-02-10: 4 mg via INTRAVENOUS
  Filled 2018-02-09: qty 2

## 2018-02-09 MED ORDER — IOPAMIDOL (ISOVUE-300) INJECTION 61%
100.0000 mL | Freq: Once | INTRAVENOUS | Status: AC | PRN
  Administered 2018-02-09: 100 mL via INTRAVENOUS

## 2018-02-09 MED ORDER — DEXTROSE 50 % IV SOLN
50.0000 mL | Freq: Once | INTRAVENOUS | Status: AC
  Administered 2018-02-09: 50 mL via INTRAVENOUS
  Filled 2018-02-09: qty 50

## 2018-02-09 NOTE — ED Provider Notes (Signed)
[facility]-EMERGENCY DEPT Provider Note   CSN: [id number] Arrival date & time: 02/09/18  1640     History   Chief Complaint Chief Complaint  Patient presents with  . Abdominal Pain    HPI Ciella B Rubalcava is a 66 y.o. female with history of diabetes on insulin pump, OSA on CPAP, obesity, SVT, SBO is sent to the ER by PCP for diagnosed SBO on CT earlier today.  Patient reports upper abdominal pain, intermittent currently 6/10 in intensity.  Associated with nausea, nonbilious nonbloody emesis, abdominal distention.  She feels dehydrated.  No flatus since Monday.  She had sudden onset of nausea, vomiting, diarrhea on Monday night that lasted the whole night, since she has not had any bowel movements.  She last vomited on Tuesday.  She went to PCP this morning who obtained a CT scan confirming SBO.  She has history of the same several years ago, hysterectomy, cholecystectomy.  She has taken MiraLAX without relief.  HPI  Past Medical History:  Diagnosis Date  . Anxiety   . Asthma   . Depression   . Depression   . Diabetes mellitus   . Fibromyalgia   . GERD (gastroesophageal reflux disease)   . Goiter   . Hair loss   . Hypertension   . Menopause   . Other and unspecified hyperlipidemia   . Positive PPD   . SVT (supraventricular tachycardia) (HCC)   . Vasculitis (HCC)     Patient Active Problem List   Diagnosis Date Noted  . Allergic rhinitis 01/09/2018  . Diabetic neuropathy (HCC) 06/30/2017  . Vitamin D deficiency 06/30/2017  . Aortic regurgitation 06/30/2017  . Mitral valve prolapse 06/30/2017  . Anxiety and depression 06/30/2017  . History of nocturia 01/17/2015  . OSA on CPAP 01/17/2015  . Asthma, chronic 09/25/2014  . Nocturia more than twice per night 09/25/2014  . Diabetic neuropathy, type II diabetes mellitus (HCC) 09/25/2014  . Primary snoring 09/25/2014  . Myalgia 09/25/2014  . Diabetes mellitus   . Goiter   . Depression   . Asthma     . SVT (supraventricular tachycardia) (HCC)   . Vasculitis (HCC)   . GERD (gastroesophageal reflux disease)   . FIBROMYALGIA 07/14/2009  . PROTEINURIA, MILD 07/14/2009  . DIABETIC PERIPHERAL NEUROPATHY 12/04/2007  . Hyperlipidemia 12/04/2007  . SUPRAVENTRICULAR TACHYCARDIA, HX OF 12/04/2007  . DIVERTICULOSIS, COLON 07/12/2007  . Hypothyroidism 04/06/2007  . DIABETES MELLITUS, TYPE I 04/06/2007  . DEPRESSION 04/06/2007    Past Surgical History:  Procedure Laterality Date  . APPENDECTOMY    . CHOLECYSTECTOMY    . PARTIAL HYSTERECTOMY       OB History   None      Home Medications    Prior to Admission medications   Medication Sig Start Date End Date Taking? Authorizing Provider  albuterol (PROVENTIL HFA;VENTOLIN HFA) 108 (90 Base) MCG/ACT inhaler Inhale 2 puffs into the lungs every 6 (six) hours as needed. 01/09/18  Yes Dixon, Mary B, PA-C  amLODipine (NORVASC) 10 MG tablet One a day 03/04/16  Yes Daub, Steven A, MD  aspirin 81 MG tablet Take 81 mg by mouth daily.   Yes [provider]  atenolol (TENORMIN) 25 MG tablet Take 1 tablet (25 mg total) by mouth 2 (two) times daily. 03/04/16  Yes Daub, Steven A, MD  atorvastatin (LIPITOR) 40 MG tablet Take 1 tablet (40 mg total) daily by mouth. 08/01/17  Yes Dixon, Mary B, PA-C  BIOTIN PO Take 10,000   mcg by mouth daily.    Yes [provider]  carboxymethylcellulose (REFRESH PLUS) 0.5 % SOLN Place 1 drop into both eyes 2 (two) times daily as needed (dry eyes).   Yes [provider]  Cholecalciferol (VITAMIN D) 2000 units CAPS Take 1 capsule (2,000 Units total) by mouth daily. 11/28/17  Yes Dixon, Mary B, PA-C  CINNAMON PO Take 1,000 mg by mouth 2 (two) times daily.   Yes [provider]  Dextrose, Diabetic Use, (GLUCOSE PO) Take by mouth as needed.    Yes [provider]  hydrochlorothiazide (HYDRODIURIL) 25 MG tablet Take 25 mg by mouth daily.   Yes [provider]  levocetirizine  (XYZAL ALLERGY 24HR) 5 MG tablet Take 1 tablet (5 mg total) by mouth every evening. 01/09/18  Yes Dixon, Mary B, PA-C  levothyroxine (SYNTHROID, LEVOTHROID) 112 MCG tablet TAKE 1 TABLET BY MOUTH  DAILY 02/01/18  Yes Dixon, Mary B, PA-C  losartan (COZAAR) 100 MG tablet Take 1 tablet (100 mg total) by mouth daily. 11/28/17  Yes Dixon, Mary B, PA-C  Multiple Vitamins-Minerals (MULTIVITAMIN WITH MINERALS) tablet Take 1 tablet by mouth daily.   Yes [provider]  NOVOLIN R RELION 100 UNIT/ML injection  01/12/17  Yes [provider]  potassium chloride SA (K-DUR,KLOR-CON) 20 MEQ tablet Take 1 tablet (20 mEq total) by mouth 2 (two) times daily. 11/28/17  Yes Dixon, Mary B, PA-C  Probiotic Product (PROBIOTIC DAILY PO) Take by mouth.   Yes [provider]  rOPINIRole (REQUIP) 0.25 MG tablet TAKE 1 TABLET BY MOUTH AT BEDTIME 12/20/17  Yes Dixon, Mary B, PA-C  sertraline (ZOLOFT) 50 MG tablet Take 1 tablet (50 mg total) by mouth daily. 11/28/17  Yes Dixon, Mary B, PA-C  TRETIN-X 0.025 % CREAM KIT APPLY ONCE DAILY AS INSTRUCTED 04/01/16  Yes Daub, Steven A, MD  albuterol (PROVENTIL) (2.5 MG/3ML) 0.083% nebulizer solution Take 3 mLs (2.5 mg total) by nebulization every 6 (six) hours as needed. ICD10:J45.909 Patient not taking: Reported on 02/09/2018 01/10/18   Dixon, Mary B, PA-C  ALPRAZolam (XANAX) 1 MG tablet TAKE ONE-HALF TABLET BY MOUTH ONCE DAILY AS NEEDED FOR STRESS Patient not taking: Reported on 02/09/2018 11/28/17   Dixon, Mary B, PA-C  augmented betamethasone dipropionate (DIPROLENE-AF) 0.05 % ointment Apply topically 2 (two) times daily. Patient not taking: Reported on 02/09/2018 03/25/15   Daub, Steven A, MD  naproxen sodium (ANAPROX) 220 MG tablet Take 220 mg by mouth as needed. Reported on 03/04/2016    [provider]  tretinoin (RETIN-A) 0.025 % cream Apply topically at bedtime. Patient not taking: Reported on 02/09/2018 04/06/16   Daub, Steven A, MD    Family  History Family History  Problem Relation Age of Onset  . Hepatitis Mother 41       died from Hep A  . Diabetes Father   . Cancer Brother   . Crohn's disease Brother   . Alcohol abuse Brother     Social History Social History   Tobacco Use  . Smoking status: Former Smoker    Years: 15.00    Types: Cigarettes    Last attempt to quit: 03/13/1988    Years since quitting: 29.9  . Smokeless tobacco: Never Used  Substance Use Topics  . Alcohol use: No  . Drug use: No     Allergies   Penicillins and Ace inhibitors   Review of Systems Review of Systems  Gastrointestinal: Positive for abdominal pain, diarrhea (Resolved), nausea and   vomiting (Resolved).  Allergic/Immunologic: Positive for immunocompromised state (Diabetes).  All other systems reviewed and are negative.    Physical Exam Updated Vital Signs BP (!) 148/81 (BP Location: Left Arm)   Pulse (!) 49   Temp 98.2 F (36.8 C) (Oral)   Resp 16   Ht 5' 3" (1.6 m)   Wt 76.7 kg (169 lb)   SpO2 100%   BMI 29.94 kg/m   Physical Exam  Constitutional: She is oriented to person, place, and time. She appears well-developed and well-nourished. No distress.  Non toxic.  Pleasant.  HENT:  Head: Normocephalic and atraumatic.  Nose: Nose normal.  Dry lips but moist mucous membranes  Eyes: Pupils are equal, round, and reactive to light. Conjunctivae and EOM are normal.  Neck: Normal range of motion.  Cardiovascular: Normal rate, regular rhythm and intact distal pulses.  2+ DP and radial pulses bilaterally. No LE edema.   Pulmonary/Chest: Effort normal and breath sounds normal.  Abdominal: Soft. Bowel sounds are normal. She exhibits distension. There is tenderness in the epigastric area and periumbilical area. There is guarding.  No suprapubic or CVA tenderness. Negative Murphy's and McBurney's.   Musculoskeletal: Normal range of motion.  Neurological: She is alert and oriented to person, place, and time.  Skin: Skin is  warm and dry. Capillary refill takes less than 2 seconds.  Psychiatric: She has a normal mood and affect. Her behavior is normal.  Nursing note and vitals reviewed.    ED Treatments / Results  Labs (all labs ordered are listed, but only abnormal results are displayed) Labs Reviewed  CBC WITH DIFFERENTIAL/PLATELET - Abnormal; Notable for the following components:      Result Value   WBC 12.0 (*)    RBC 5.22 (*)    Neutro Abs 8.5 (*)    All other components within normal limits  CBG MONITORING, ED - Abnormal; Notable for the following components:   Glucose-Capillary 54 (*)    All other components within normal limits  COMPREHENSIVE METABOLIC PANEL  LIPASE, BLOOD  URINALYSIS, ROUTINE W REFLEX MICROSCOPIC  CBG MONITORING, ED    EKG None  Radiology Ct Abdomen Pelvis W Contrast  Result Date: 02/09/2018 CLINICAL DATA:  Mid to lower abdominal pain with nausea and vomiting for 5 days. History of prior hysterectomy, appendectomy and cholecystectomy. EXAM: CT ABDOMEN AND PELVIS WITH CONTRAST TECHNIQUE: Multidetector CT imaging of the abdomen and pelvis was performed using the standard protocol following bolus administration of intravenous contrast. CONTRAST:  100mL ISOVUE-300 IOPAMIDOL (ISOVUE-300) INJECTION 61% COMPARISON:  CT scan 10/13/2009 FINDINGS: Lower chest: The lung bases are clear of acute process. No pleural effusion or pulmonary lesions. The heart is normal in size. No pericardial effusion. Aortic and coronary artery calcifications are noted. The distal esophagus and aorta are unremarkable. Hepatobiliary: No focal hepatic lesions. Status post cholecystectomy with mild intra and extrahepatic biliary dilatation, not significantly changed. Pancreas: No mass, inflammation or ductal dilatation. Spleen: Normal size.  No focal lesions. Adrenals/Urinary Tract: The adrenal glands and kidneys are unremarkable. No renal, ureteral or bladder calculi or mass. Stomach/Bowel: The stomach is  unremarkable. The duodenum is normal. There are dilated small bowel loops with air-fluid levels into the mid pelvic area. Transition to normal/decompressed distal small bowel loops most likely due to adhesions. Caliber change noted in the right mid abdomen demonstrated best on the coronal series images 49, 50 and 51. No masses identified. The terminal ileum is normal. The colons unremarkable. No acute inflammatory changes or   obstructive findings. Moderate stool. Vascular/Lymphatic: Stable scattered atherosclerotic calcifications involving the aorta and iliac arteries. No aneurysm or dissection. The branch vessels are patent. The major venous structures are patent. No mesenteric or retroperitoneal mass or adenopathy. Reproductive: Surgically absent. Other: No pelvic mass or free pelvic fluid collections. No inguinal mass or adenopathy. Musculoskeletal: No significant bony findings. Benign-appearing hemangioma noted in the L2 vertebral body. Advanced facet disease noted in the lower lumbar spine. IMPRESSION: 1. CT findings consistent with a early or partial small bowel obstruction due to adhesions in the right lower quadrant. 2. No other significant abdominal/pelvic findings. 3. Status post cholecystectomy with mild associated biliary dilatation. Electronically Signed   By: P.  Gallerani M.D.   On: 02/09/2018 16:11    Procedures Procedures (including critical care time)  Medications Ordered in ED Medications  ondansetron (ZOFRAN) injection 4 mg (4 mg Intravenous Given 02/09/18 1818)  morphine 4 MG/ML injection 4 mg (4 mg Intravenous Given 02/09/18 1818)  sodium chloride 0.9 % bolus 1,000 mL (1,000 mLs Intravenous New Bag/Given 02/09/18 1818)  dextrose 50 % solution 50 mL (50 mLs Intravenous Given 02/09/18 1829)     Initial Impression / Assessment and Plan / ED Course  I have reviewed the triage vital signs and the nursing notes.  Pertinent labs & imaging results that were available during my care of the  patient were reviewed by me and considered in my medical decision making (see chart for details).     1745: Early general surgery consult. Pending screening labs.   1815: Spoke to Dr. Toth who recommends admission, NG tube, will see patient in the morning.  Pending labs.  Final Clinical Impressions(s) / ED Diagnoses   1920: Spoke to hospitalist who will admit patient. WBC 12. NGT ordered in. Pt noted to have hypoglycemia, she has no had any PO, she is refusing to turn off/remove insulin pump.  Final diagnoses:  Small bowel obstruction (HCC)    ED Discharge Orders    None       Gibbons, Claudia J, PA-C 02/09/18 1921    Yao, David Hsienta, MD 02/09/18 2330  

## 2018-02-09 NOTE — Consult Note (Signed)
Reason for Consult:abdominal pain Referring Physician: Dr. [name] is an 67 y.o. [name].  HPI: The patient is a Ashley year old white [name] who presents with a one-week history of nausea vomiting and abdominal pain.  The pain has gotten steadily worse during the week.  She had some diarrhea on Tuesday but no bowel movement since then.  She actually feels much better since coming to the hospital.  She apparently has not vomited now in 2 days.  A CT scan showed a at least partial small bowel obstruction.  Her only history of abdominal surgery is a hysterectomy and laparoscopic cholecystectomy  Past Medical History:  Diagnosis Date  . Anxiety   . Asthma   . Depression   . Depression   . Diabetes mellitus   . Fibromyalgia   . GERD (gastroesophageal reflux disease)   . Goiter   . Hair loss   . Hypertension   . Menopause   . Other and unspecified hyperlipidemia   . Positive PPD   . SVT (supraventricular tachycardia) ([place])   . Vasculitis [facility])     Past Surgical History:  Procedure Laterality Date  . APPENDECTOMY    . CHOLECYSTECTOMY    . PARTIAL HYSTERECTOMY      Family History  Problem Relation Age of Onset  . Hepatitis Mother 70       died from [place]  . Diabetes Father   . Cancer Brother   . Crohn's disease Brother   . Alcohol abuse Brother     Social History:  reports that she quit smoking about 29 years ago. Her smoking use included cigarettes. She quit after 15.00 years of use. She has never used smokeless tobacco. She reports that she does not drink alcohol or use drugs.  Allergies:  Allergies  Allergen Reactions  . Penicillins Anaphylaxis    Has patient had a PCN reaction causing immediate rash, facial/tongue/throat swelling, SOB or lightheadedness with hypotension: No Has patient had a PCN reaction causing severe rash involving mucus membranes or skin necrosis: No Has patient had a PCN reaction that required hospitalization: Yes/ Has patient had a PCN  reaction occurring within the last 10 years: No If all of the above answers are "NO", then may proceed with Cephalosporin use.   . Ace Inhibitors     INDUCED BRONCHOSPASM    Medications: I have reviewed the patient's current medications.  Results for orders placed or performed during the hospital encounter of 02/09/18 (from the past 48 hour(s))  CBG monitoring, ED     Status: None   Collection Time: 02/09/18  4:49 PM  Result Value Ref Range   Glucose-Capillary 82 65 - 99 mg/dL  Comprehensive metabolic panel     Status: None   Collection Time: 02/09/18  5:56 PM  Result Value Ref Range   Sodium 139 135 - 145 mmol/L   Potassium 4.6 3.5 - 5.1 mmol/L   Chloride 101 101 - 111 mmol/L   CO2 29 22 - 32 mmol/L   Glucose, Bld 71 65 - 99 mg/dL   BUN 17 6 - 20 mg/dL   Creatinine, Ser 0.91 0.44 - 1.00 mg/dL   Calcium 9.8 8.9 - 10.3 mg/dL   Total Protein 8.0 6.5 - 8.1 g/dL   Albumin 4.3 3.5 - 5.0 g/dL   AST 21 15 - 41 U/L   ALT 15 14 - 54 U/L   Alkaline Phosphatase 58 38 - 126 U/L   Total Bilirubin 0.7 0.3 - 1.2  mg/dL   GFR calc non Af Amer >60 >60 mL/min   GFR calc Af Amer >60 >60 mL/min    Comment: (NOTE) The eGFR has been calculated using the CKD EPI equation. This calculation has not been validated in all clinical situations. eGFR's persistently <60 mL/min signify possible Chronic Kidney Disease.    Anion gap 9 5 - 15    Comment: Performed at [facility], [place] [address], [place], [place] [address]  Lipase, blood     Status: None   Collection Time: 02/09/18  5:56 PM  Result Value Ref Range   Lipase 23 11 - 51 U/L    Comment: Performed at [facility], [place] [address] [place], [place] [address]  CBC with Diff     Status: Abnormal   Collection Time: 02/09/18  5:56 PM  Result Value Ref Range   WBC 12.0 (H) 4.0 - 10.5 K/uL   RBC 5.22 (H) 3.87 - 5.11 MIL/uL   Hemoglobin 14.9 12.0 - 15.0 g/dL   HCT 44.9 36.0 - 46.0 %   MCV 86.0 78.0 - 100.0 fL    MCH 28.5 26.0 - 34.0 pg   MCHC 33.2 30.0 - 36.0 g/dL   RDW 14.2 11.5 - 15.5 %   Platelets 242 150 - 400 K/uL   Neutrophils Relative % 71 %   Neutro Abs 8.5 (H) 1.7 - 7.7 K/uL   Lymphocytes Relative 21 %   Lymphs Abs 2.5 0.7 - 4.0 K/uL   Monocytes Relative 7 %   Monocytes Absolute 0.8 0.1 - 1.0 K/uL   Eosinophils Relative 1 %   Eosinophils Absolute 0.1 0.0 - 0.7 K/uL   Basophils Relative 0 %   Basophils Absolute 0.0 0.0 - 0.1 K/uL    Comment: Performed at [facility], [place] [address], [place], [place] [address]  CBG monitoring, ED     Status: Abnormal   Collection Time: 02/09/18  6:23 PM  Result Value Ref Range   Glucose-Capillary 54 (L) 65 - 99 mg/dL  Urinalysis, Routine w reflex microscopic     Status: Abnormal   Collection Time: 02/09/18  7:07 PM  Result Value Ref Range   Color, Urine STRAW (A) YELLOW   APPearance CLEAR CLEAR   Specific Gravity, Urine 1.019 1.005 - 1.030   pH 7.0 5.0 - 8.0   Glucose, UA 50 (A) NEGATIVE mg/dL   Hgb urine dipstick NEGATIVE NEGATIVE   Bilirubin Urine NEGATIVE NEGATIVE   Ketones, ur NEGATIVE NEGATIVE mg/dL   Protein, ur NEGATIVE NEGATIVE mg/dL   Nitrite NEGATIVE NEGATIVE   Leukocytes, UA NEGATIVE NEGATIVE    Comment: Performed at [facility], [place] [address] [place], [place] [address]  CBG monitoring, ED     Status: Abnormal   Collection Time: 02/09/18  8:34 PM  Result Value Ref Range   Glucose-Capillary 103 (H) 65 - 99 mg/dL  Glucose, capillary     Status: Abnormal   Collection Time: 02/09/18  9:41 PM  Result Value Ref Range   Glucose-Capillary 101 (H) 65 - 99 mg/dL    Ct Abdomen Pelvis W Contrast  Result Date: 02/09/2018 CLINICAL DATA:  Mid to lower abdominal pain with nausea and vomiting for 5 days. History of prior hysterectomy, appendectomy and cholecystectomy. EXAM: CT ABDOMEN AND PELVIS WITH CONTRAST TECHNIQUE: Multidetector CT imaging of the abdomen and pelvis was performed using the  standard protocol following bolus administration of intravenous contrast. CONTRAST:  119m ISOVUE-300 IOPAMIDOL (ISOVUE-300) INJECTION  61% COMPARISON:  CT scan 10/13/2009 FINDINGS: Lower chest: The lung bases are clear of acute process. No pleural effusion or pulmonary lesions. The heart is normal in size. No pericardial effusion. Aortic and coronary artery calcifications are noted. The distal esophagus and aorta are unremarkable. Hepatobiliary: No focal hepatic lesions. Status post cholecystectomy with mild intra and extrahepatic biliary dilatation, not significantly changed. Pancreas: No mass, inflammation or ductal dilatation. Spleen: Normal size.  No focal lesions. Adrenals/Urinary Tract: The adrenal glands and kidneys are unremarkable. No renal, ureteral or bladder calculi or mass. Stomach/Bowel: The stomach is unremarkable. The duodenum is normal. There are dilated small bowel loops with air-fluid levels into the mid pelvic area. Transition to normal/decompressed distal small bowel loops most likely due to adhesions. Caliber change noted in the right mid abdomen demonstrated best on the coronal series images 49, 50 and 51. No masses identified. The terminal ileum is normal. The colons unremarkable. No acute inflammatory changes or obstructive findings. Moderate stool. Vascular/Lymphatic: Stable scattered atherosclerotic calcifications involving the aorta and iliac arteries. No aneurysm or dissection. The branch vessels are patent. The major venous structures are patent. No mesenteric or retroperitoneal mass or adenopathy. Reproductive: Surgically absent. Other: No pelvic mass or free pelvic fluid collections. No inguinal mass or adenopathy. Musculoskeletal: No significant bony findings. Benign-appearing hemangioma noted in the L2 vertebral body. Advanced facet disease noted in the lower lumbar spine. IMPRESSION: 1. CT findings consistent with a early or partial small bowel obstruction due to adhesions in the  right lower quadrant. 2. No other significant abdominal/pelvic findings. 3. Status post cholecystectomy with mild associated biliary dilatation. Electronically Signed   By: Marijo Sanes M.D.   On: 02/09/2018 16:11    Review of Systems  Constitutional: Negative.   HENT: Negative.   Eyes: Negative.   Respiratory: Negative.   Cardiovascular: Negative.   Gastrointestinal: Positive for abdominal pain, diarrhea, nausea and vomiting.  Genitourinary: Negative.   Musculoskeletal: Negative.   Skin: Negative.   Neurological: Negative.   Endo/Heme/Allergies: Negative.   Psychiatric/Behavioral: Negative.    Blood pressure (!) 123/55, pulse (!) 56, temperature 99.4 F (37.4 C), temperature source Oral, resp. rate 20, height 5' 3" (1.6 m), weight 78.1 kg (172 lb 1.6 oz), SpO2 91 %. Physical Exam  Constitutional: She is oriented to person, place, and time. She appears well-developed and well-nourished. No distress.  HENT:  Head: Normocephalic and atraumatic.  Mouth/Throat: No oropharyngeal exudate.  Eyes: Pupils are equal, round, and reactive to light. Conjunctivae and EOM are normal.  Neck: Normal range of motion. Neck supple.  Cardiovascular: Normal rate, regular rhythm and normal heart sounds.  Respiratory: Effort normal and breath sounds normal. No stridor. No respiratory distress.  GI: Soft. Bowel sounds are normal. She exhibits distension.  There is minimal tenderness  Musculoskeletal: Normal range of motion. She exhibits no edema or tenderness.  Neurological: She is alert and oriented to person, place, and time. Coordination normal.  Skin: Skin is warm and dry. No rash noted.  Psychiatric: She has a normal mood and affect. Her behavior is normal. Thought content normal.    Assessment/Plan: The patient appears to have a small bowel obstruction but feels much better since coming to the hospital.  She has refused an NG tube.  At this point I would recommend treating her with bowel rest and  repeat abdominal x-rays in the morning.  We may still be able to start the small bowel protocol tomorrow.  We will follow her closely with  you.  If she does not improve she may come to require exploration.  TOTH III,PAUL S 02/09/2018, 10:32 PM

## 2018-02-09 NOTE — ED Notes (Signed)
ED TO INPATIENT HANDOFF REPORT  Name/Age/Gender [name] 67 y.o. female  Code Status Advance Directive Documentation     Most Recent Value  Type of Advance Directive  Healthcare Power of Attorney, Living will  Pre-existing out of facility DNR order (yellow form or pink MOST form)  -  "MOST" Form in Place?  -      Home/SNF/Other Home  Chief Complaint possible bowl obstruction   Level of Care/Admitting Diagnosis ED Disposition    ED Disposition Condition [place]: [facility] [100102]  Level of Care: Telemetry [5]  Admit to tele based on following criteria: Other see comments  Comments: Bradycardia  Diagnosis: SBO (small bowel obstruction) [facility]) [[id number]]  Admitting Physician: Bethena Roys Nessa.Cuff  Attending Physician: Bethena Roys Nessa.Cuff  Estimated length of stay: past midnight tomorrow  Certification:: I certify this patient will need inpatient services for at least 2 midnights  PT Class (Do Not Modify): Inpatient [101]  PT Acc Code (Do Not Modify): Private [1]       Medical History Past Medical History:  Diagnosis Date  . Anxiety   . Asthma   . Depression   . Depression   . Diabetes mellitus   . Fibromyalgia   . GERD (gastroesophageal reflux disease)   . Goiter   . Hair loss   . Hypertension   . Menopause   . Other and unspecified hyperlipidemia   . Positive PPD   . SVT (supraventricular tachycardia) ([place])   . Vasculitis (HCC)     Allergies Allergies  Allergen Reactions  . Penicillins Anaphylaxis    Has patient had a PCN reaction causing immediate rash, facial/tongue/throat swelling, SOB or lightheadedness with hypotension: No Has patient had a PCN reaction causing severe rash involving mucus membranes or skin necrosis: No Has patient had a PCN reaction that required hospitalization: Yes/ Has patient had a PCN reaction occurring within the last 10 years: No If all of the above  answers are "NO", then may proceed with Cephalosporin use.   Humberto Leep Inhibitors     INDUCED BRONCHOSPASM    IV Location/Drains/Wounds Patient Lines/Drains/Airways Status   Active Line/Drains/Airways    Name:   Placement date:   Placement time:   Site:   Days:   Peripheral IV 02/09/18 Right Antecubital   02/09/18    1811    Antecubital   less than 1          Labs/Imaging Results for orders placed or performed during the hospital encounter of 02/09/18 (from the past 48 hour(s))  CBG monitoring, ED     Status: None   Collection Time: 02/09/18  4:49 PM  Result Value Ref Range   Glucose-Capillary 82 65 - 99 mg/dL  Comprehensive metabolic panel     Status: None   Collection Time: 02/09/18  5:56 PM  Result Value Ref Range   Sodium 139 135 - 145 mmol/L   Potassium 4.6 3.5 - 5.1 mmol/L   Chloride 101 101 - 111 mmol/L   CO2 29 22 - 32 mmol/L   Glucose, Bld 71 65 - 99 mg/dL   BUN 17 6 - 20 mg/dL   Creatinine, Ser 0.91 0.44 - 1.00 mg/dL   Calcium 9.8 8.9 - 10.3 mg/dL   Total Protein 8.0 6.5 - 8.1 g/dL   Albumin 4.3 3.5 - 5.0 g/dL   AST 21 15 - 41 U/L   ALT 15 14 - 54 U/L  Alkaline Phosphatase 58 38 - 126 U/L   Total Bilirubin 0.7 0.3 - 1.2 mg/dL   GFR calc non Af Amer >60 >60 mL/min   GFR calc Af Amer >60 >60 mL/min    Comment: (NOTE) The eGFR has been calculated using the CKD EPI equation. This calculation has not been validated in all clinical situations. eGFR's persistently <60 mL/min signify possible Chronic Kidney Disease.    Anion gap 9 5 - 15    Comment: Performed at [facility], [place] [address] [place], [place] [address]  Lipase, blood     Status: None   Collection Time: 02/09/18  5:56 PM  Result Value Ref Range   Lipase 23 11 - 51 U/L    Comment: Performed at [facility], [place] [address] [place], [place] [address]  CBC with Diff     Status: Abnormal   Collection Time: 02/09/18  5:56 PM  Result Value Ref Range   WBC 12.0 (H)  4.0 - 10.5 K/uL   RBC 5.22 (H) 3.87 - 5.11 MIL/uL   Hemoglobin 14.9 12.0 - 15.0 g/dL   HCT 44.9 36.0 - 46.0 %   MCV 86.0 78.0 - 100.0 fL   MCH 28.5 26.0 - 34.0 pg   MCHC 33.2 30.0 - 36.0 g/dL   RDW 14.2 11.5 - 15.5 %   Platelets 242 150 - 400 K/uL   Neutrophils Relative % 71 %   Neutro Abs 8.5 (H) 1.7 - 7.7 K/uL   Lymphocytes Relative 21 %   Lymphs Abs 2.5 0.7 - 4.0 K/uL   Monocytes Relative 7 %   Monocytes Absolute 0.8 0.1 - 1.0 K/uL   Eosinophils Relative 1 %   Eosinophils Absolute 0.1 0.0 - 0.7 K/uL   Basophils Relative 0 %   Basophils Absolute 0.0 0.0 - 0.1 K/uL    Comment: Performed at [facility], [place] [address], [place], [place] [address]  CBG monitoring, ED     Status: Abnormal   Collection Time: 02/09/18  6:23 PM  Result Value Ref Range   Glucose-Capillary 54 (L) 65 - 99 mg/dL  Urinalysis, Routine w reflex microscopic     Status: Abnormal   Collection Time: 02/09/18  7:07 PM  Result Value Ref Range   Color, Urine STRAW (A) YELLOW   APPearance CLEAR CLEAR   Specific Gravity, Urine 1.019 1.005 - 1.030   pH 7.0 5.0 - 8.0   Glucose, UA 50 (A) NEGATIVE mg/dL   Hgb urine dipstick NEGATIVE NEGATIVE   Bilirubin Urine NEGATIVE NEGATIVE   Ketones, ur NEGATIVE NEGATIVE mg/dL   Protein, ur NEGATIVE NEGATIVE mg/dL   Nitrite NEGATIVE NEGATIVE   Leukocytes, UA NEGATIVE NEGATIVE    Comment: Performed at [facility], [place] [address], [place], [place] [address]  CBG monitoring, ED     Status: Abnormal   Collection Time: 02/09/18  8:34 PM  Result Value Ref Range   Glucose-Capillary 103 (H) 65 - 99 mg/dL   Ct Abdomen Pelvis W Contrast  Result Date: 02/09/2018 CLINICAL DATA:  Mid to lower abdominal pain with nausea and vomiting for 5 days. History of prior hysterectomy, appendectomy and cholecystectomy. EXAM: CT ABDOMEN AND PELVIS WITH CONTRAST TECHNIQUE: Multidetector CT imaging of the abdomen and pelvis was performed using the standard  protocol following bolus administration of intravenous contrast. CONTRAST:  140m ISOVUE-300 IOPAMIDOL (ISOVUE-300) INJECTION 61% COMPARISON:  CT scan 10/13/2009 FINDINGS: Lower chest: The lung bases are clear of acute process.  No pleural effusion or pulmonary lesions. The heart is normal in size. No pericardial effusion. Aortic and coronary artery calcifications are noted. The distal esophagus and aorta are unremarkable. Hepatobiliary: No focal hepatic lesions. Status post cholecystectomy with mild intra and extrahepatic biliary dilatation, not significantly changed. Pancreas: No mass, inflammation or ductal dilatation. Spleen: Normal size.  No focal lesions. Adrenals/Urinary Tract: The adrenal glands and kidneys are unremarkable. No renal, ureteral or bladder calculi or mass. Stomach/Bowel: The stomach is unremarkable. The duodenum is normal. There are dilated small bowel loops with air-fluid levels into the mid pelvic area. Transition to normal/decompressed distal small bowel loops most likely due to adhesions. Caliber change noted in the right mid abdomen demonstrated best on the coronal series images 49, 50 and 51. No masses identified. The terminal ileum is normal. The colons unremarkable. No acute inflammatory changes or obstructive findings. Moderate stool. Vascular/Lymphatic: Stable scattered atherosclerotic calcifications involving the aorta and iliac arteries. No aneurysm or dissection. The branch vessels are patent. The major venous structures are patent. No mesenteric or retroperitoneal mass or adenopathy. Reproductive: Surgically absent. Other: No pelvic mass or free pelvic fluid collections. No inguinal mass or adenopathy. Musculoskeletal: No significant bony findings. Benign-appearing hemangioma noted in the L2 vertebral body. Advanced facet disease noted in the lower lumbar spine. IMPRESSION: 1. CT findings consistent with a early or partial small bowel obstruction due to adhesions in the right  lower quadrant. 2. No other significant abdominal/pelvic findings. 3. Status post cholecystectomy with mild associated biliary dilatation. Electronically Signed   By: Marijo Sanes M.D.   On: 02/09/2018 16:11    Pending Labs [employer] (From admission, onward)   Start     Ordered   Signed and Held  HIV antibody (Routine Testing)  Once,   R     Signed and Held   Signed and Held  Basic metabolic panel  Tomorrow morning,   R     Signed and Held   Signed and Held  CBC  Tomorrow morning,   R     Signed and Held      Vitals/Pain Today's Vitals   02/09/18 1647 02/09/18 1936 02/09/18 2020 02/09/18 2023  BP:  (!) 144/53    Pulse:  (!) 56    Resp:  17    Temp:  98.4 F (36.9 C)    TempSrc:  Oral    SpO2:  97%    Weight: 169 lb (76.7 kg)     Height: _0  (1.6 m)     PainSc:   8  3     Isolation Precautions No active isolations  Medications Medications  HYDROmorphone (DILAUDID) injection 0.5 mg (0.5 mg Intravenous Given 02/09/18 2029)  ondansetron (ZOFRAN) injection 4 mg (4 mg Intravenous Given 02/09/18 1818)  morphine 4 MG/ML injection 4 mg (4 mg Intravenous Given 02/09/18 1818)  sodium chloride 0.9 % bolus 1,000 mL (1,000 mLs Intravenous New Bag/Given 02/09/18 1818)  dextrose 50 % solution 50 mL (50 mLs Intravenous Given 02/09/18 1829)    Mobility walks

## 2018-02-09 NOTE — ED Triage Notes (Addendum)
Patient from outpatient CT, + for partial SBO. Hx of same. Reports abdominal pain and N/V since Sunday. Last BM Monday. Sent for admission by Guss Bunde PA.

## 2018-02-09 NOTE — H&P (Signed)
History and Physical    [name]:[id number] DOB: [birth year] DOA: 02/09/2018  PCP: Orlena Sheldon, PA-C   Patient coming from: Home   Chief Complaint: Nausea, vomiting and abdominal pain.  HPI: [name] is a 67 y.o. female with medical history significant for diabetes with neuropathy, asthma, hypothyroidism, SVT, history of SBO, who was sent from PCPs office to the Ed, with complaints of nausea vomiting and abdominal pain that started 4 days ago Sunday, May 26th.  Patient had multiple episodes of vomiting up to 7 times a day, but no vomiting since yesterday.  Patient reports last bowel movement passage of flatus was 1 day 3 days ago, bowel movement was loose.  Patient reports abdominal distention also.  No dysuria.  History of abdominal surgeries cholecystectomy and hysterectomy.  History of small bowel obstruction ~8- 10 years ago.  Patient has an insulin drip she has been monitoring her blood sugars closely.  She has barely eaten anything over the past 4 days. Blood work and CT abdomen and pelvis was ordered at her PCP's office.  CT findings consistent with early or partial small bowel obstruction due to adhesions in the right lower quadrant, status post cholecystectomy with mild associated biliary dilatation.  Patient was subsequently transferred to the ED  ED Course: Heart rate for 49-58, T max temp-  99.4, blood pressure systolic 048G to 891, WBC 12, stable hemoglobin 14.9, creatinine at baseline 0.91, glucose 71 down to 54 on fingersticks.  5ms of D50 given in ED. UA 50 glucose otherwise clean , liver enzymes were unremarkable.  Lipase normal 23.  EDP talked to general surgery on-call Dr. TMarlou Starks recommended hospitalist admission NG tube, will see patient in a.m.  Review of Systems: As per HPI otherwise 10 point review of systems negative.   Past Medical History:  Diagnosis Date  . Anxiety   . Asthma   . Depression   . Depression   . Diabetes mellitus   .  Fibromyalgia   . GERD (gastroesophageal reflux disease)   . Goiter   . Hair loss   . Hypertension   . Menopause   . Other and unspecified hyperlipidemia   . Positive PPD   . SVT (supraventricular tachycardia) ([place]   . Vasculitis ([facility]     Past Surgical History:  Procedure Laterality Date  . APPENDECTOMY    . CHOLECYSTECTOMY    . PARTIAL HYSTERECTOMY       reports that she quit smoking about 29 years ago. Her smoking use included cigarettes. She quit after 15.00 years of use. She has never used smokeless tobacco. She reports that she does not drink alcohol or use drugs.  Allergies  Allergen Reactions  . Penicillins Anaphylaxis    Has patient had a PCN reaction causing immediate rash, facial/tongue/throat swelling, SOB or lightheadedness with hypotension: No Has patient had a PCN reaction causing severe rash involving mucus membranes or skin necrosis: No Has patient had a PCN reaction that required hospitalization: Yes/ Has patient had a PCN reaction occurring within the last 10 years: No If all of the above answers are "NO", then may proceed with Cephalosporin use.   . Ace Inhibitors     INDUCED BRONCHOSPASM    Family History  Problem Relation Age of Onset  . Hepatitis Mother 423      [place] Diabetes Father   . Cancer Brother   . Crohn's disease Brother   . Alcohol abuse Brother  Prior to Admission medications   Medication Sig Start Date End Date Taking? Authorizing Provider  albuterol (PROVENTIL HFA;VENTOLIN HFA) 108 (90 Base) MCG/ACT inhaler Inhale 2 puffs into the lungs every 6 (six) hours as needed. 01/09/18  Yes Dena Billet B, PA-C  amLODipine (NORVASC) 10 MG tablet One a day 03/04/16  Yes Daub, Loura Back, MD  aspirin 81 MG tablet Take 81 mg by mouth daily.   Yes [provider]  atenolol (TENORMIN) 25 MG tablet Take 1 tablet (25 mg total) by mouth 2 (two) times daily. 03/04/16  Yes Darlyne Russian, MD  atorvastatin (LIPITOR) 40 MG tablet Take  1 tablet (40 mg total) daily by mouth. 08/01/17  Yes Dixon, Mary B, PA-C  BIOTIN PO Take 10,000 mcg by mouth daily.    Yes [provider]  carboxymethylcellulose (REFRESH PLUS) 0.5 % SOLN Place 1 drop into both eyes 2 (two) times daily as needed (dry eyes).   Yes [provider]  Cholecalciferol (VITAMIN D) 2000 units CAPS Take 1 capsule (2,000 Units total) by mouth daily. 11/28/17  Yes Dena Billet B, PA-C  CINNAMON PO Take 1,000 mg by mouth 2 (two) times daily.   Yes [provider]  Dextrose, Diabetic Use, (GLUCOSE PO) Take by mouth as needed.    Yes [provider]  hydrochlorothiazide (HYDRODIURIL) 25 MG tablet Take 25 mg by mouth daily.   Yes [provider]  levocetirizine (XYZAL ALLERGY 24HR) 5 MG tablet Take 1 tablet (5 mg total) by mouth every evening. 01/09/18  Yes Dixon, Lonie Peak, PA-C  levothyroxine (SYNTHROID, LEVOTHROID) 112 MCG tablet TAKE 1 TABLET BY MOUTH  DAILY 02/01/18  Yes Dena Billet B, PA-C  losartan (COZAAR) 100 MG tablet Take 1 tablet (100 mg total) by mouth daily. 11/28/17  Yes Orlena Sheldon, PA-C  Multiple Vitamins-Minerals (MULTIVITAMIN WITH MINERALS) tablet Take 1 tablet by mouth daily.   Yes [provider]  NOVOLIN R RELION 100 UNIT/ML injection  01/12/17  Yes [provider]  potassium chloride SA (K-DUR,KLOR-CON) 20 MEQ tablet Take 1 tablet (20 mEq total) by mouth 2 (two) times daily. 11/28/17  Yes Dena Billet B, PA-C  Probiotic Product (PROBIOTIC DAILY PO) Take by mouth.   Yes [provider]  rOPINIRole (REQUIP) 0.25 MG tablet TAKE 1 TABLET BY MOUTH AT BEDTIME 12/20/17  Yes Dixon, Mary B, PA-C  sertraline (ZOLOFT) 50 MG tablet Take 1 tablet (50 mg total) by mouth daily. 11/28/17  Yes Dena Billet B, PA-C  TRETIN-X 0.025 % CREAM KIT APPLY ONCE DAILY AS INSTRUCTED 04/01/16  Yes Daub, Loura Back, MD  albuterol (PROVENTIL) (2.5 MG/3ML) 0.083% nebulizer solution Take 3 mLs (2.5 mg total) by nebulization every 6  (six) hours as needed. [id number] Patient not taking: Reported on 02/09/2018 01/10/18   Dena Billet B, PA-C  ALPRAZolam Duanne Moron) 1 MG tablet TAKE ONE-HALF TABLET BY MOUTH ONCE DAILY AS NEEDED FOR STRESS Patient not taking: Reported on 02/09/2018 11/28/17   Dena Billet B, PA-C  augmented betamethasone dipropionate (DIPROLENE-AF) 0.05 % ointment Apply topically 2 (two) times daily. Patient not taking: Reported on 02/09/2018 03/25/15   Darlyne Russian, MD  naproxen sodium (ANAPROX) 220 MG tablet Take 220 mg by mouth as needed. Reported on 03/04/2016    [provider]  tretinoin (RETIN-A) 0.025 % cream Apply topically at bedtime. Patient not taking: Reported on 02/09/2018 04/06/16   Darlyne Russian, MD    Physical Exam: Vitals:   02/09/18 1646 02/09/18 1647  02/09/18 1936  BP: (!) 148/81  (!) 144/53  Pulse: (!) 49  (!) 56  Resp: 16  17  Temp: 98.2 F (36.8 C)  98.4 F (36.9 C)  TempSrc: Oral  Oral  SpO2: 100%  97%  Weight:  76.7 kg (169 lb)   Height:  5' 3"  (1.6 m)     Constitutional: NAD, calm, comfortable Vitals:   02/09/18 1646 02/09/18 1647 02/09/18 1936  BP: (!) 148/81  (!) 144/53  Pulse: (!) 49  (!) 56  Resp: 16  17  Temp: 98.2 F (36.8 C)  98.4 F (36.9 C)  TempSrc: Oral  Oral  SpO2: 100%  97%  Weight:  76.7 kg (169 lb)   Height:  5' 3"  (1.6 m)    Eyes: PERRL, lids and conjunctivae normal ENMT: Mucous membranes are moist. Posterior pharynx clear of any exudate or lesions..  Neck: normal, supple, no masses, no thyromegaly Respiratory: clear to auscultation bilaterally, no wheezing, no crackles. Normal respiratory effort. No accessory muscle use.  Cardiovascular: Bradycardia but regular rate and rhythm, no murmurs / rubs / gallops. No extremity edema. 2+ pedal pulses. No carotid bruits.  Abdomen: Abdominal distention with mild to mod tenderness, no masses palpated. No hepatosplenomegaly. Bowel sounds positive.  No Bowel sounds appreciated Musculoskeletal: no clubbing /  cyanosis. No joint deformity upper and lower extremities. Good ROM, no contractures. Normal muscle tone.  Skin: no rashes, lesions, ulcers. No induration Neurologic: CN 2-12 grossly intact.  Strength 5/5 in all 4.  Psychiatric: Normal judgment and insight. Alert and oriented x 3. Normal mood.   Labs on Admission: I have personally reviewed following labs and imaging studies  CBC: Recent Labs  Lab 02/09/18 1051 02/09/18 1756  WBC 13.2* 12.0*  NEUTROABS 10,085* 8.5*  HGB 14.1 14.9  HCT 41.4 44.9  MCV 81.7 86.0  PLT 267 382   Basic Metabolic Panel: Recent Labs  Lab 02/09/18 1051 02/09/18 1756  NA 139 139  K 4.8 4.6  CL 103 101  CO2 27 29  GLUCOSE 104* 71  BUN 16 17  CREATININE 0.97 0.91  CALCIUM 9.5 9.8   Liver Function Tests: Recent Labs  Lab 02/09/18 1051 02/09/18 1756  AST 16 21  ALT 10 15  ALKPHOS  --  58  BILITOT 0.7 0.7  PROT 6.8 8.0  ALBUMIN  --  4.3   Recent Labs  Lab 02/09/18 1756  LIPASE 23   CBG: Recent Labs  Lab 02/09/18 1649 02/09/18 1823  GLUCAP 82 54*    Radiological Exams on Admission: Ct Abdomen Pelvis W Contrast  Result Date: 02/09/2018 CLINICAL DATA:  Mid to lower abdominal pain with nausea and vomiting for 5 days. History of prior hysterectomy, appendectomy and cholecystectomy. EXAM: CT ABDOMEN AND PELVIS WITH CONTRAST TECHNIQUE: Multidetector CT imaging of the abdomen and pelvis was performed using the standard protocol following bolus administration of intravenous contrast. CONTRAST:  116m ISOVUE-300 IOPAMIDOL (ISOVUE-300) INJECTION 61% COMPARISON:  CT scan 10/13/2009 FINDINGS: Lower chest: The lung bases are clear of acute process. No pleural effusion or pulmonary lesions. The heart is normal in size. No pericardial effusion. Aortic and coronary artery calcifications are noted. The distal esophagus and aorta are unremarkable. Hepatobiliary: No focal hepatic lesions. Status post cholecystectomy with mild intra and extrahepatic biliary  dilatation, not significantly changed. Pancreas: No mass, inflammation or ductal dilatation. Spleen: Normal size.  No focal lesions. Adrenals/Urinary Tract: The adrenal glands and kidneys are unremarkable. No renal, ureteral or bladder calculi or  mass. Stomach/Bowel: The stomach is unremarkable. The duodenum is normal. There are dilated small bowel loops with air-fluid levels into the mid pelvic area. Transition to normal/decompressed distal small bowel loops most likely due to adhesions. Caliber change noted in the right mid abdomen demonstrated best on the coronal series images 49, 50 and 51. No masses identified. The terminal ileum is normal. The colons unremarkable. No acute inflammatory changes or obstructive findings. Moderate stool. Vascular/Lymphatic: Stable scattered atherosclerotic calcifications involving the aorta and iliac arteries. No aneurysm or dissection. The branch vessels are patent. The major venous structures are patent. No mesenteric or retroperitoneal mass or adenopathy. Reproductive: Surgically absent. Other: No pelvic mass or free pelvic fluid collections. No inguinal mass or adenopathy. Musculoskeletal: No significant bony findings. Benign-appearing hemangioma noted in the L2 vertebral body. Advanced facet disease noted in the lower lumbar spine. IMPRESSION: 1. CT findings consistent with a early or partial small bowel obstruction due to adhesions in the right lower quadrant. 2. No other significant abdominal/pelvic findings. 3. Status post cholecystectomy with mild associated biliary dilatation. Electronically Signed   By: Marijo Sanes M.D.   On: 02/09/2018 16:11    EKG: Heart rate 56, sinus rhythm, PR interval 206.  First-degree heart block.  QTc 436.  T wave flattening 1 aVL V4-V6, compared to EKG 11/2012.  Assessment/Plan Principal Problem:   SBO (small bowel obstruction) (HCC) Active Problems:   Hypothyroidism   Diabetes mellitus (HCC)   Depression   Asthma   SVT  (supraventricular tachycardia) (HCC)   Asthma, chronic   OSA on CPAP  Small bowel obstruction-history of abdominal surgeries. CT abdomen and pelvis with contrast-ileal partial small bowel obstruction due to adhesions in the right lower quadrant, s/p cholecystectomy with mild associated biliary dilatation.  Normal liver enzymes.  No improvement in pain with IV morphine 4 mg given in ED. -N.p.o. -NG tube -IV fluid normal saline 100 cc/h X 12 hrs -BMP, CBC a.m. -Dilaudid 0.5 mg every 3 hourly for pain -Zofran  Diabetes mellitus-insulin pump.  Requiring 33ms of D50 in the ED for hypoglycemia glucose 54 while on insulin pump.  Glucose now 103.  Last hemoglobin A1c on file 12/2014-6.7. -Discontinue insulin pump at this time -Sliding scale insulin every 4 hourly -Closely monitor CBGs while n.p.o.  Hypothyroidism Home medications Synthroid 1 1 2  mg daily.  Last TSH 12/2017- 1.82. -If prolonged admission and NPO will need to start IV Synthroid  Bradycardia-heart rate 49-58.  Asymptomatic.  No chest pain. Home medications atenolol 25 mg twice daily. -Hold home atenolol while n.p.o. -EKG shows prolonged PR interval 206, sinus rhythm.  New compared to EKG 2014.  OSA-  - CPAP  Hypertension-blood pressure systolic 1 1628Bto 1151  Home medications atenolol losartan, HCTZ held. -PRN hydralazine for systolic greater than 1761 HIV as part of routine health screening  DVT prophylaxis: Lovenox Code Status: FULL Family Communication: Spouse at bedside Disposition Plan: per rounding team Consults called: General surgery Dr. TMarlou StarksAdmission status: Inpatient, telemetry for bradycardia   EBethena RoysMD [facility] Pager [phone] 6PM-2AM.  Otherwise please contact night-coverage [email or web address] Password [email or web address]  02/09/2018, 8:01 PM

## 2018-02-09 NOTE — Progress Notes (Signed)
Patient ID: [name] MRN: [id number], DOB: Apr 21, 1951, 67 y.o. Date of Encounter: @DATE @  Chief Complaint:  Chief Complaint  Patient presents with  . Nausea  . Abdominal Pain  . Constipation    little diarrhea yesterday   . Fatigue    HPI: 67 y.o. year old female  presents with above.   I reviewed that she just had a routine office visit with me about 1 month ago.  At that time the labs that I monitor for the medical conditions I monitor were stable. See that note for details regarding her medical history. She does see multiple specialists including endocrinologist and is on insulin pump. She was already established with these multiple specialists prior to establishing  primary care with me recently.  Today she reports the following information:  States that symptoms started this past Sunday afternoon which was May 26. States that that day she "felt blah and nauseous". States that on Sunday night she vomited and experienced "waves of pain and nausea."  Says she " would feel nausea-- then pain--- then nausea--- then pain."  States that she is "so nauseous" and has "no appetite" that she "can not eat". States that yesterday morning she tried some smoothie that contained fruit and ice and did have some of that.  However did have increased nausea after that.  But did not vomit. Otherwise has had truly no oral intake since Sunday night except for that small amount of smoothie yesterday morning.  States that she has been having vomiting at night.  Says "for some reason when I lay down that makes the nausea a lot worse and that is when I vomit.  " States that on Tuesday night she thinks she vomited 7 times. However since then she has only been vomiting about once per night.  Reports that she has a history of a bowel obstruction years ago but cannot tell me any estimate of how long ago.  Has been concerned that may have another bowel obstruction.  States that yesterday she  had a stool that was "like water ". States that on Monday she had both diarrhea and vomiting but then-- after that--- the diarrhea had stopped and she was only having the nausea vomiting since then until the one episode of watery diarrhea yesterday.  States that on Tuesday and Wednesday she had no stool.  Reviewed that she is afebrile here.  She reports that she has had no known fever at home.  No chills.  Reports that she has been checking blood sugar frequently since she has been n.p.o.  Was getting some low readings and taking glucose to keep this controlled.  Says that she just checked her sugar in our lobby and reading was 108.  She is monitoring this closely.  Note-- that she is accompanied by a family member.  Says that she had this family member drive her today since she was concerned given her symptoms and possible low blood sugars. Also note that patient appears very comfortable and does not appear in any distress during this visit.     Past Medical History:  Diagnosis Date  . Anxiety   . Asthma   . Depression   . Depression   . Diabetes mellitus   . Fibromyalgia   . GERD (gastroesophageal reflux disease)   . Goiter   . Hair loss   . Hypertension   . Menopause   . Other and unspecified hyperlipidemia   . Positive PPD   .  SVT (supraventricular tachycardia) ([place])   . Vasculitis ([place])      Home Meds: Outpatient Medications Prior to Visit  Medication Sig Dispense Refill  . albuterol (PROVENTIL HFA;VENTOLIN HFA) 108 (90 Base) MCG/ACT inhaler Inhale 2 puffs into the lungs every 6 (six) hours as needed. 8.5 g 11  . albuterol (PROVENTIL) (2.5 MG/3ML) 0.083% nebulizer solution Take 3 mLs (2.5 mg total) by nebulization every 6 (six) hours as needed. ICD10:J45.909 75 mL 1  . ALPRAZolam (XANAX) 1 MG tablet TAKE ONE-HALF TABLET BY MOUTH ONCE DAILY AS NEEDED FOR STRESS 90 tablet 0  . amLODipine (NORVASC) 10 MG tablet One a day 30 tablet 11  . aspirin 81 MG tablet Take 81 mg by  mouth daily.    Marland Kitchen atenolol (TENORMIN) 25 MG tablet Take 1 tablet (25 mg total) by mouth 2 (two) times daily. 180 tablet 3  . atorvastatin (LIPITOR) 40 MG tablet Take 1 tablet (40 mg total) daily by mouth. 90 tablet 3  . augmented betamethasone dipropionate (DIPROLENE-AF) 0.05 % ointment Apply topically 2 (two) times daily. 30 g 5  . BIOTIN PO Take 10,000 mcg by mouth daily.     . Cholecalciferol (VITAMIN D) 2000 units CAPS Take 1 capsule (2,000 Units total) by mouth daily. 30 capsule 3  . CINNAMON PO Take 1,000 mg by mouth 2 (two) times daily.    Marland Kitchen Dextrose, Diabetic Use, (GLUCOSE PO) Take by mouth as needed.     . hydrochlorothiazide (HYDRODIURIL) 25 MG tablet Take 25 mg by mouth daily.    Marland Kitchen levocetirizine (XYZAL ALLERGY 24HR) 5 MG tablet Take 1 tablet (5 mg total) by mouth every evening. 30 tablet 11  . levothyroxine (SYNTHROID, LEVOTHROID) 112 MCG tablet TAKE 1 TABLET BY MOUTH  DAILY 90 tablet 0  . losartan (COZAAR) 100 MG tablet Take 1 tablet (100 mg total) by mouth daily. 90 tablet 0  . Multiple Vitamins-Minerals (MULTIVITAMIN WITH MINERALS) tablet Take 1 tablet by mouth daily.    . naproxen sodium (ANAPROX) 220 MG tablet Take 220 mg by mouth as needed. Reported on 03/04/2016    . NOVOLIN R RELION 100 UNIT/ML injection     . potassium chloride SA (K-DUR,KLOR-CON) 20 MEQ tablet Take 1 tablet (20 mEq total) by mouth 2 (two) times daily. 180 tablet 3  . Probiotic Product (PROBIOTIC DAILY PO) Take by mouth.    Marland Kitchen rOPINIRole (REQUIP) 0.25 MG tablet TAKE 1 TABLET BY MOUTH AT BEDTIME 90 tablet 1  . sertraline (ZOLOFT) 50 MG tablet Take 1 tablet (50 mg total) by mouth daily. 90 tablet 3  . TRETIN-X 0.025 % CREAM KIT APPLY ONCE DAILY AS INSTRUCTED 1 kit 10  . tretinoin (RETIN-A) 0.025 % cream Apply topically at bedtime. 45 g 5   No facility-administered medications prior to visit.     Allergies:  Allergies  Allergen Reactions  . Penicillins Anaphylaxis  . Ace Inhibitors     INDUCED  BRONCHOSPASM    Social History   Socioeconomic History  . Marital status: Married    Spouse name: Not on file  . Number of children: Not on file  . Years of education: Not on file  . Highest education level: Not on file  Occupational History  . Not on file  Social Needs  . Financial resource strain: Not on file  . Food insecurity:    Worry: Not on file    Inability: Not on file  . Transportation needs:    Medical: Not on file  Non-medical: Not on file  Tobacco Use  . Smoking status: Former Smoker    Years: 15.00    Types: Cigarettes    Last attempt to quit: 03/13/1988    Years since quitting: 29.9  . Smokeless tobacco: Never Used  Substance and Sexual Activity  . Alcohol use: No  . Drug use: No  . Sexual activity: Not on file  Lifestyle  . Physical activity:    Days per week: Not on file    Minutes per session: Not on file  . Stress: Not on file  Relationships  . Social connections:    Talks on phone: Not on file    Gets together: Not on file    Attends religious service: Not on file    Active member of club or organization: Not on file    Attends meetings of clubs or organizations: Not on file    Relationship status: Not on file  . Intimate partner violence:    Fear of current or ex partner: Not on file    Emotionally abused: Not on file    Physically abused: Not on file    Forced sexual activity: Not on file  Other Topics Concern  . Not on file  Social History Narrative   Caffeine 1-2 cups coffee daily.   Lives at home with husband and daughter.   Retired.   Associates degree.   Has 4 kids.    Family History  Problem Relation Age of Onset  . Hepatitis Mother 7       died from [place]  . Diabetes Father   . Cancer Brother   . Crohn's disease Brother   . Alcohol abuse Brother      Review of Systems:  See HPI for pertinent ROS. All other ROS negative.    Physical Exam: Blood pressure 124/78, pulse (!) 51, temperature 98.3 F (36.8 C),  temperature source Oral, resp. rate 14, height 5' 3"  (1.6 m), weight 77.2 kg (170 lb 3.2 oz), SpO2 98 %., Body mass index is 30.15 kg/m. General: WNWD WF. Appears in no acute distress. Neck: Supple. No thyromegaly. No lymphadenopathy. Lungs: Clear bilaterally to auscultation without wheezes, rales, or rhonchi. Breathing is unlabored. Heart: RRR with S1 S2. No murmurs, rubs, or gallops. Abdomen: Soft, non-distended. She has tenderness with palpation of epigastric region.  This is the most tender area with palpation.  There is also some tenderness with palpation just inferior to this area to the mid upper abdomen. No masses with palpation. Musculoskeletal:  Strength and tone normal for age. Extremities/Skin: Warm and dry.  Neuro: Alert and oriented X 3. Moves all extremities spontaneously. Gait is normal. CNII-XII grossly in tact. Psych:  Responds to questions appropriately with a normal affect.     ASSESSMENT AND PLAN:  67 y.o. year old female with   1. Abdominal pain, epigastric At this time I will further evaluate by obtaining stat CBC and CMET--- These are both marked stat. Also obtain CT abdomen pelvis stat.  She is sent directly to CT scan from our office. Will follow-up with the results of these labs and CT today. Treatment plan pending these results. - CBC with Differential/Platelet - COMPLETE METABOLIC PANEL WITH GFR - CT ABDOMEN PELVIS W CONTRAST; Future  2. Intractable vomiting with nausea, unspecified vomiting type At this time I will further evaluate by obtaining stat CBC and CMET--- These are both marked stat. Also obtain CT abdomen pelvis stat.  She is sent directly to CT scan  from our office. Will follow-up with the results of these labs and CT today. Treatment plan pending these results. - CBC with Differential/Platelet - COMPLETE METABOLIC PANEL WITH GFR - CT ABDOMEN PELVIS W CONTRAST; Future  3. Anorexia At this time I will further evaluate by obtaining stat CBC  and CMET--- These are both marked stat. Also obtain CT abdomen pelvis stat.  She is sent directly to CT scan from our office. Will follow-up with the results of these labs and CT today. Treatment plan pending these results. - CBC with Differential/Platelet - COMPLETE METABOLIC PANEL WITH GFR - CT ABDOMEN PELVIS W CONTRAST; Future  Diabetes--- On Insulin Pump    Signed, [address] [place], [place], [facility] 02/09/2018 10:52 AM

## 2018-02-09 NOTE — Telephone Encounter (Signed)
Received call from [facility] with call report.   Early or partial small bowel obstruction due to adhesions in the right lower quadrant noted. PA made aware and new orders obtained to send patient to ER.   Advised radiologist to transfer patient to ER.

## 2018-02-10 ENCOUNTER — Inpatient Hospital Stay (HOSPITAL_COMMUNITY): Payer: Medicare Other

## 2018-02-10 DIAGNOSIS — E039 Hypothyroidism, unspecified: Secondary | ICD-10-CM

## 2018-02-10 DIAGNOSIS — G4733 Obstructive sleep apnea (adult) (pediatric): Secondary | ICD-10-CM

## 2018-02-10 DIAGNOSIS — Z9989 Dependence on other enabling machines and devices: Secondary | ICD-10-CM

## 2018-02-10 DIAGNOSIS — F329 Major depressive disorder, single episode, unspecified: Secondary | ICD-10-CM

## 2018-02-10 DIAGNOSIS — E108 Type 1 diabetes mellitus with unspecified complications: Secondary | ICD-10-CM

## 2018-02-10 DIAGNOSIS — I10 Essential (primary) hypertension: Secondary | ICD-10-CM

## 2018-02-10 DIAGNOSIS — K56609 Unspecified intestinal obstruction, unspecified as to partial versus complete obstruction: Secondary | ICD-10-CM

## 2018-02-10 DIAGNOSIS — I471 Supraventricular tachycardia: Secondary | ICD-10-CM

## 2018-02-10 LAB — CBC
HEMATOCRIT: 42.8 % (ref 36.0–46.0)
Hemoglobin: 13.8 g/dL (ref 12.0–15.0)
MCH: 28.3 pg (ref 26.0–34.0)
MCHC: 32.2 g/dL (ref 30.0–36.0)
MCV: 87.9 fL (ref 78.0–100.0)
PLATELETS: 198 10*3/uL (ref 150–400)
RBC: 4.87 MIL/uL (ref 3.87–5.11)
RDW: 14.4 % (ref 11.5–15.5)
WBC: 11 10*3/uL — AB (ref 4.0–10.5)

## 2018-02-10 LAB — GLUCOSE, CAPILLARY
GLUCOSE-CAPILLARY: 140 mg/dL — AB (ref 65–99)
GLUCOSE-CAPILLARY: 159 mg/dL — AB (ref 65–99)
GLUCOSE-CAPILLARY: 111 mg/dL — AB (ref 65–99)
Glucose-Capillary: 143 mg/dL — ABNORMAL HIGH (ref 65–99)
Glucose-Capillary: 135 mg/dL — ABNORMAL HIGH (ref 65–99)
Glucose-Capillary: 138 mg/dL — ABNORMAL HIGH (ref 65–99)

## 2018-02-10 LAB — BASIC METABOLIC PANEL
Anion gap: 11 (ref 5–15)
BUN: 14 mg/dL (ref 6–20)
CO2: 23 mmol/L (ref 22–32)
CREATININE: 0.84 mg/dL (ref 0.44–1.00)
Calcium: 8.7 mg/dL — ABNORMAL LOW (ref 8.9–10.3)
Chloride: 105 mmol/L (ref 101–111)
Glucose, Bld: 146 mg/dL — ABNORMAL HIGH (ref 65–99)
POTASSIUM: 5.6 mmol/L — AB (ref 3.5–5.1)
SODIUM: 139 mmol/L (ref 135–145)

## 2018-02-10 LAB — HIV ANTIBODY (ROUTINE TESTING W REFLEX): HIV Screen 4th Generation wRfx: NONREACTIVE

## 2018-02-10 MED ORDER — DIATRIZOATE MEGLUMINE & SODIUM 66-10 % PO SOLN
90.0000 mL | Freq: Once | ORAL | Status: AC
  Administered 2018-02-10: 90 mL via NASOGASTRIC
  Filled 2018-02-10: qty 90

## 2018-02-10 MED ORDER — BISACODYL 10 MG RE SUPP
10.0000 mg | Freq: Every day | RECTAL | Status: DC
  Administered 2018-02-10 – 2018-02-12 (×3): 10 mg via RECTAL
  Filled 2018-02-10 (×3): qty 1

## 2018-02-10 MED ORDER — HYDROCORTISONE 2.5 % RE CREA: 1.0000 "application " | TOPICAL_CREAM | Freq: Four times a day (QID) | RECTAL | Status: DC | PRN

## 2018-02-10 MED ORDER — GUAIFENESIN-DM 100-10 MG/5ML PO SYRP: 10.0000 mL | ORAL_SOLUTION | ORAL | Status: DC | PRN

## 2018-02-10 MED ORDER — LORAZEPAM 2 MG/ML IJ SOLN
0.5000 mg | Freq: Four times a day (QID) | INTRAMUSCULAR | Status: DC | PRN
  Administered 2018-02-10 – 2018-02-11 (×2): 0.5 mg via INTRAVENOUS
  Filled 2018-02-10 (×2): qty 1

## 2018-02-10 MED ORDER — MAGIC MOUTHWASH
15.0000 mL | Freq: Four times a day (QID) | ORAL | Status: DC | PRN
  Filled 2018-02-10: qty 15

## 2018-02-10 MED ORDER — PHENOL 1.4 % MT LIQD: 1.0000 | OROMUCOSAL | Status: DC | PRN

## 2018-02-10 MED ORDER — METHOCARBAMOL 1000 MG/10ML IJ SOLN
1000.0000 mg | Freq: Four times a day (QID) | INTRAMUSCULAR | Status: DC | PRN
  Filled 2018-02-10: qty 10

## 2018-02-10 MED ORDER — DIPHENHYDRAMINE HCL 50 MG/ML IJ SOLN: 12.5000 mg | Freq: Four times a day (QID) | INTRAMUSCULAR | Status: DC | PRN

## 2018-02-10 MED ORDER — LACTATED RINGERS IV BOLUS: 1000.0000 mL | Freq: Three times a day (TID) | INTRAVENOUS | Status: AC | PRN

## 2018-02-10 MED ORDER — LIP MEDEX EX OINT
1.0000 "application " | TOPICAL_OINTMENT | Freq: Two times a day (BID) | CUTANEOUS | Status: DC
  Administered 2018-02-10 – 2018-02-13 (×5): 1 via TOPICAL
  Filled 2018-02-10 (×3): qty 7

## 2018-02-10 MED ORDER — METOCLOPRAMIDE HCL 5 MG/ML IJ SOLN: 10.0000 mg | Freq: Four times a day (QID) | INTRAMUSCULAR | Status: DC | PRN

## 2018-02-10 MED ORDER — LEVOTHYROXINE SODIUM 100 MCG IV SOLR
56.0000 ug | Freq: Every day | INTRAVENOUS | Status: DC
  Administered 2018-02-10 – 2018-02-11 (×2): 56 ug via INTRAVENOUS
  Filled 2018-02-10 (×2): qty 5

## 2018-02-10 MED ORDER — METOPROLOL TARTRATE 5 MG/5ML IV SOLN
2.5000 mg | Freq: Three times a day (TID) | INTRAVENOUS | Status: DC
  Administered 2018-02-10 – 2018-02-11 (×3): 2.5 mg via INTRAVENOUS
  Filled 2018-02-10 (×4): qty 5

## 2018-02-10 MED ORDER — PROCHLORPERAZINE EDISYLATE 10 MG/2ML IJ SOLN: 5.0000 mg | INTRAMUSCULAR | Status: DC | PRN

## 2018-02-10 MED ORDER — MENTHOL 3 MG MT LOZG: 1.0000 | LOZENGE | OROMUCOSAL | Status: DC | PRN

## 2018-02-10 MED ORDER — HYDROCORTISONE 1 % EX CREA: 1.0000 "application " | TOPICAL_CREAM | Freq: Three times a day (TID) | CUTANEOUS | Status: DC | PRN

## 2018-02-10 MED ORDER — ALUM & MAG HYDROXIDE-SIMETH 200-200-20 MG/5ML PO SUSP: 30.0000 mL | Freq: Four times a day (QID) | ORAL | Status: DC | PRN

## 2018-02-10 MED ORDER — SODIUM CHLORIDE 0.9 % IV SOLN
INTRAVENOUS | Status: DC
  Administered 2018-02-10 – 2018-02-13 (×6): via INTRAVENOUS

## 2018-02-10 NOTE — Progress Notes (Signed)
Pt has NG tube in place and wishes to defer CPAP until removal, RN aware  RT to monitor and assess as needed.

## 2018-02-10 NOTE — Progress Notes (Signed)
Second attempt made Pt was assisted onto the side of the bed so that chin would be tilted down to the chest. NGT 16" right nare attempted but unsuccessful. . Pt c/o increased pain and discomfort and request primary RN to stop. Will discuss with surgical provider

## 2018-02-10 NOTE — Progress Notes (Signed)
NGT ordered, both nares assessed. Attempted to insert through left nare. Pt was unable tolerate at this time. Pt reports having anxiety and request medication. Provider paged with update. Will follow up and continue to monitor.

## 2018-02-10 NOTE — Progress Notes (Signed)
Attempted 3rd attempt of NG tube placement with 74F tube. Tube inserted to back of nose and patient demanded we stop. Pt stated that she will not do that again and she will do whatever is necessary to not experience that again. Education and support provided and pt still continues to refuse tube. Per MD if bedside RN was unsuccessful IR to insert NG tube.  Education provided to patient about IR inserting tube. At this time patient is open to discuss IR insertion. Order placed via V.O by Dr Michaell Cowing.  Anja Neuzil A

## 2018-02-10 NOTE — Progress Notes (Addendum)
Gastrografin administered via NGT at 1730. Will clamp x 1 hr.

## 2018-02-10 NOTE — Progress Notes (Signed)
[facility] Progress Note     Subjective: CC:  Felt slightly better overnight but abdominal pain is back this morning - intermittent, cramping, LUQ pain. Persistent nausea but no emesis. Small episode flatus. No BM. Reports history of SBOx2 >10 y ago, both resolved non-operatively and without NG tubes.  Objective: Vital signs in last 24 hours: Temp:  [98.2 F (36.8 C)-99.6 F (37.6 C)] 99.6 F (37.6 C) (05/31 0509) Pulse Rate:  [49-63] 63 (05/31 0509) Resp:  [14-20] 16 (05/31 0509) BP: (122-148)/(53-81) 128/60 (05/31 0509) SpO2:  [91 %-100 %] 91 % (05/31 0509) Weight:  [76.7 kg (169 lb)-78.1 kg (172 lb 1.6 oz)] 78.1 kg (172 lb 1.6 oz) (05/30 2136) Last BM Date: 02/06/18  Intake/Output from previous day: No intake/output data recorded. Intake/Output this shift: No intake/output data recorded.  PE: Gen:  Alert, NAD, pleasant and cooperative, husband at bedside. Card:  Regular rate and rhythm Pulm:  Normal effort, clear to auscultation bilaterally Abd: Soft, mild to moderate distention, TTP epigastrium and LUQ without peritonitis, hypoactive BS Skin: warm and dry, no rashes  Psych: A&Ox3   Lab Results:  Recent Labs    02/09/18 1756 02/10/18 0433  WBC 12.0* 11.0*  HGB 14.9 13.8  HCT 44.9 42.8  PLT 242 198   BMET Recent Labs    02/09/18 1756 02/10/18 0433  NA 139 139  K 4.6 5.6*  CL 101 105  CO2 29 23  GLUCOSE 71 146*  BUN 17 14  CREATININE 0.91 0.84  CALCIUM 9.8 8.7*   PT/INR No results for input(s): LABPROT, INR in the last 72 hours. CMP     Component Value Date/Time   NA 139 02/10/2018 0433   K 5.6 (H) 02/10/2018 0433   CL 105 02/10/2018 0433   CO2 23 02/10/2018 0433   GLUCOSE 146 (H) 02/10/2018 0433   BUN 14 02/10/2018 0433   CREATININE 0.84 02/10/2018 0433   CREATININE 0.97 02/09/2018 1051   CALCIUM 8.7 (L) 02/10/2018 0433   PROT 8.0 02/09/2018 1756   ALBUMIN 4.3 02/09/2018 1756   AST 21 02/09/2018 1756   ALT 15 02/09/2018 1756    ALKPHOS 58 02/09/2018 1756   BILITOT 0.7 02/09/2018 1756   GFRNONAA >60 02/10/2018 0433   GFRNONAA 60 02/09/2018 1051   GFRAA >60 02/10/2018 0433   GFRAA 70 02/09/2018 1051   Lipase     Component Value Date/Time   LIPASE 23 02/09/2018 1756       Studies/Results: Dg Abd 1 View  Result Date: 02/10/2018 CLINICAL DATA:  Evaluate partial small bowel obstruction EXAM: ABDOMEN - 1 VIEW COMPARISON:  CT 02/09/2018 FINDINGS: Dilated small bowel loops again noted. Gas and contrast material noted in the right colon. Findings compatible with partial small bowel obstruction, not significantly changed. Prior cholecystectomy. No free air or organomegaly. IMPRESSION: Continued partial small bowel obstruction pattern, not significantly changed. Electronically Signed   By: Charlett Nose M.D.   On: 02/10/2018 08:59   Ct Abdomen Pelvis W Contrast  Result Date: 02/09/2018 CLINICAL DATA:  Mid to lower abdominal pain with nausea and vomiting for 5 days. History of prior hysterectomy, appendectomy and cholecystectomy. EXAM: CT ABDOMEN AND PELVIS WITH CONTRAST TECHNIQUE: Multidetector CT imaging of the abdomen and pelvis was performed using the standard protocol following bolus administration of intravenous contrast. CONTRAST:  ISOVUE-300 IOPAMIDOL (ISOVUE-300) INJECTION 61% COMPARISON:  CT scan 10/13/2009 FINDINGS: Lower chest: The lung bases are clear of acute process. No pleural effusion or pulmonary lesions.  The heart is normal in size. No pericardial effusion. Aortic and coronary artery calcifications are noted. The distal esophagus and aorta are unremarkable. Hepatobiliary: No focal hepatic lesions. Status post cholecystectomy with mild intra and extrahepatic biliary dilatation, not significantly changed. Pancreas: No mass, inflammation or ductal dilatation. Spleen: Normal size.  No focal lesions. Adrenals/Urinary Tract: The adrenal glands and kidneys are unremarkable. No renal, ureteral or bladder  calculi or mass. Stomach/Bowel: The stomach is unremarkable. The duodenum is normal. There are dilated small bowel loops with air-fluid levels into the mid pelvic area. Transition to normal/decompressed distal small bowel loops most likely due to adhesions. Caliber change noted in the right mid abdomen demonstrated best on the coronal series images 49, 50 and 51. No masses identified. The terminal ileum is normal. The colons unremarkable. No acute inflammatory changes or obstructive findings. Moderate stool. Vascular/Lymphatic: Stable scattered atherosclerotic calcifications involving the aorta and iliac arteries. No aneurysm or dissection. The branch vessels are patent. The major venous structures are patent. No mesenteric or retroperitoneal mass or adenopathy. Reproductive: Surgically absent. Other: No pelvic mass or free pelvic fluid collections. No inguinal mass or adenopathy. Musculoskeletal: No significant bony findings. Benign-appearing hemangioma noted in the L2 vertebral body. Advanced facet disease noted in the lower lumbar spine. IMPRESSION: 1. CT findings consistent with a early or partial small bowel obstruction due to adhesions in the right lower quadrant. 2. No other significant abdominal/pelvic findings. 3. Status post cholecystectomy with mild associated biliary dilatation. Electronically Signed   By: Rudie Meyer M.D.   On: 02/09/2018 16:11    Anti-infectives: Anti-infectives (From admission, onward)   None     Assessment/Plan pSBO - PMH hysterectomy and laparoscopic cholecystectomy - DG abd w/ persistent pSBO pattern; contrast from CT yesterday noted in R colon  - no significant clinical improvement, recomment NG tube decompression and SB protocol. Patient agrees to this.  - NPO, IVF - mobilizing 4-5x daily   LOS: 1 day    Adam Phenix , [facility] 02/10/2018, 9:49 AM Pager: [phone] Consults: [phone] Mon-Fri 7:00 am-4:30 pm Sat-Sun 7:00  am-11:30 am

## 2018-02-10 NOTE — Progress Notes (Signed)
Pt voiced to this RN that if something happens to her, she does NOT want CPR or to be "on a vent".  She would rather "elect comfort measures."  Pt stated she has the paperwork at home and voiced understanding that somebody should bring in paperwork ASAP. Pt is a FULL code as of right now. Linton Flemings, NP made aware of patient's wishes.    Pt also had a 10 beat run of vtach and was asymptomatic.  Linton Flemings, NP made aware of this as well.

## 2018-02-10 NOTE — Care Management Note (Signed)
Case Management Note  Patient Details  Name: [name] MRN: [id number] Date of Birth: 10-Dec-1950  Subjective/Objective:  SBO                  Action/Plan: Plan to discharge home with no needs.   Expected Discharge Date:  (unknown)               Expected Discharge Plan:  Home/Self Care  In-House Referral:     Discharge planning Services  CM Consult  Post Acute Care Choice:    Choice offered to:     DME Arranged:    DME Agency:     HH Arranged:    HH Agency:     Status of Service:  Completed, signed off  If discussed at [employer] of Stay Meetings, dates discussed:    Additional CommentsGeni Bers, RN 02/10/2018, 10:58 AM

## 2018-02-10 NOTE — Progress Notes (Signed)
PROGRESS NOTE    [name]   ZOX:[id number]  DOB: 1951-04-09  DOA: 02/09/2018 PCP: Dorena Bodo, PA-C   Brief Narrative:  [name] is a 67 y.o. female with medical history significant for diabetes with neuropathy, asthma, hypothyroidism, SVT, history of SBO, who was sent from PCPs office for nausea, vomiting and abdominal pain for 4 days.  Found to have a SBO on CT.    Subjective: Not passing gas but feel stomach rumbling. No vomiting in the hospital.     Assessment & Plan:   Principal Problem:   SBO (small bowel obstruction)  - xray today show persistence of obstruction- gen surgery has ordered and NG tube and small bowel protocol - cont IVF> NS at 100cc/hr  Active Problems:   Hypothyroidism - oral Synthroid changed to IV     Diabetes mellitus  - Insulin pump is off- cont SSI every 4 hrs    Depression - need to hold Zoloft and xanax due to sBO - having a panic attack during NG placement prohibiting placement- PRN Ativan IV ordered  HTN/ SVT (supraventricular tachycardia)  - hold Norvasc, Cozaar, HCTZ  - BP stable - in place of Atenolol, give IV Lopressor 2.5ing  TID    Asthma  - cont Albuterol    OSA on CPAP - CPAP ordered  DVT prophylaxis: SCDs Code Status: Full code Family Communication:  Disposition Plan: per sugery Consultants:   General surgery Procedures:    Antimicrobials:  Anti-infectives (From admission, onward)   None       Objective: Vitals:   02/09/18 1936 02/09/18 2037 02/09/18 2136 02/10/18 0509  BP: (!) 144/53 122/61 (!) 123/55 128/60  Pulse: (!) 56 (!) 58 (!) 56 63  Resp: Temp: 98.4 F (36.9 C)  99.4 F (37.4 C) 99.6 F (37.6 C)  TempSrc: Oral  Oral Oral  SpO2: 97% 94% 91% 91%  Weight:   78.1 kg (172 lb 1.6 oz)   Height:    (1.6 m)    No intake or output data in the 24 hours ending 02/10/18 1242 Filed Weights   02/09/18 1647 02/09/18 2136  Weight: 76.7 kg (169 lb) 78.1 kg (172 lb 1.6  oz)    Examination: General exam: Appears comfortable  HEENT: PERRLA, oral mucosa moist, no sclera icterus or thrush Respiratory system: Clear to auscultation. Respiratory effort normal. Cardiovascular system: S1 & S2 heard, RRR.   Gastrointestinal system: Abdomen soft, non-tender, moderately distended. Normal bowel sounds.   Central nervous system: Alert and oriented. No focal neurological deficits. Extremities: No cyanosis, clubbing or edema Skin: No rashes or ulcers Psychiatry:  Mood & affect appropriate.     Data Reviewed: I have personally reviewed following labs and imaging studies  CBC: Recent Labs  Lab 02/09/18 1051 02/09/18 1756 02/10/18 0433  WBC 13.2* 12.0* 11.0*  NEUTROABS 10,085* 8.5*  --   HGB 14.1 14.9 13.8  HCT 41.4 44.9 42.8  MCV 81.7 86.0 87.9  PLT 267 242 198   Basic Metabolic Panel: Recent Labs  Lab 02/09/18 1051 02/09/18 1756 02/10/18 0433  NA 139 139 139  K 4.8 4.6 5.6*  CL 103 101 105  CO2 GLUCOSE 104* 71 146*  BUN CREATININE 0.97 0.91 0.84  CALCIUM 9.5 9.8 8.7*   GFR: Estimated Creatinine Clearance: 63.4 mL/min (by C-G formula based on SCr of 0.84 mg/dL). Liver Function Tests: Recent Labs  Lab 02/09/18  1051 02/09/18 1756  AST 16 21  ALT 10 15  ALKPHOS  --  58  BILITOT 0.7 0.7  PROT 6.8 8.0  ALBUMIN  --  4.3   Recent Labs  Lab 02/09/18 1756  LIPASE 23   No results for input(s): AMMONIA in the last 168 hours. Coagulation Profile: No results for input(s): INR, PROTIME in the last 168 hours. Cardiac Enzymes: No results for input(s): CKTOTAL, CKMB, CKMBINDEX, TROPONINI in the last 168 hours. BNP (last 3 results) No results for input(s): PROBNP in the last 8760 hours. HbA1C: No results for input(s): HGBA1C in the last 72 hours. CBG: Recent Labs  Lab 02/09/18 2141 02/10/18 0106 02/10/18 0507 02/10/18 0803 02/10/18 1205  GLUCAP 101* 143* 140* 135* 138*   Lipid Profile: No results for input(s):  CHOL, HDL, LDLCALC, TRIG, CHOLHDL, LDLDIRECT in the last 72 hours. Thyroid Function Tests: No results for input(s): TSH, T4TOTAL, FREET4, T3FREE, THYROIDAB in the last 72 hours. Anemia Panel: No results for input(s): VITAMINB12, FOLATE, FERRITIN, TIBC, IRON, RETICCTPCT in the last 72 hours. Urine analysis:    Component Value Date/Time   COLORURINE STRAW (A) 02/09/2018 1907   APPEARANCEUR CLEAR 02/09/2018 1907   LABSPEC 1.019 02/09/2018 1907   PHURINE 7.0 02/09/2018 1907   GLUCOSEU 50 (A) 02/09/2018 1907   HGBUR NEGATIVE 02/09/2018 1907   BILIRUBINUR NEGATIVE 02/09/2018 1907   KETONESUR NEGATIVE 02/09/2018 1907   PROTEINUR NEGATIVE 02/09/2018 1907   UROBILINOGEN 0.2 05/30/2008 2345   NITRITE NEGATIVE 02/09/2018 1907   LEUKOCYTESUR NEGATIVE 02/09/2018 1907   Sepsis Labs: (procalcitonin:4,lacticidven:4) )No results found for this or any previous visit (from the past 240 hour(s)).       Radiology Studies: Dg Abd 1 View  Result Date: 02/10/2018 CLINICAL DATA:  Evaluate partial small bowel obstruction EXAM: ABDOMEN - 1 VIEW COMPARISON:  CT 02/09/2018 FINDINGS: Dilated small bowel loops again noted. Gas and contrast material noted in the right colon. Findings compatible with partial small bowel obstruction, not significantly changed. Prior cholecystectomy. No free air or organomegaly. IMPRESSION: Continued partial small bowel obstruction pattern, not significantly changed. Electronically Signed   By: Charlett Nose M.D.   On: 02/10/2018 08:59   Ct Abdomen Pelvis W Contrast  Result Date: 02/09/2018 CLINICAL DATA:  Mid to lower abdominal pain with nausea and vomiting for 5 days. History of prior hysterectomy, appendectomy and cholecystectomy. EXAM: CT ABDOMEN AND PELVIS WITH CONTRAST TECHNIQUE: Multidetector CT imaging of the abdomen and pelvis was performed using the standard protocol following bolus administration of intravenous contrast. CONTRAST:  ISOVUE-300 IOPAMIDOL  (ISOVUE-300) INJECTION 61% COMPARISON:  CT scan 10/13/2009 FINDINGS: Lower chest: The lung bases are clear of acute process. No pleural effusion or pulmonary lesions. The heart is normal in size. No pericardial effusion. Aortic and coronary artery calcifications are noted. The distal esophagus and aorta are unremarkable. Hepatobiliary: No focal hepatic lesions. Status post cholecystectomy with mild intra and extrahepatic biliary dilatation, not significantly changed. Pancreas: No mass, inflammation or ductal dilatation. Spleen: Normal size.  No focal lesions. Adrenals/Urinary Tract: The adrenal glands and kidneys are unremarkable. No renal, ureteral or bladder calculi or mass. Stomach/Bowel: The stomach is unremarkable. The duodenum is normal. There are dilated small bowel loops with air-fluid levels into the mid pelvic area. Transition to normal/decompressed distal small bowel loops most likely due to adhesions. Caliber change noted in the right mid abdomen demonstrated best on the coronal series images 49, 50 and 51. No masses identified. The terminal ileum is normal.  The colons unremarkable. No acute inflammatory changes or obstructive findings. Moderate stool. Vascular/Lymphatic: Stable scattered atherosclerotic calcifications involving the aorta and iliac arteries. No aneurysm or dissection. The branch vessels are patent. The major venous structures are patent. No mesenteric or retroperitoneal mass or adenopathy. Reproductive: Surgically absent. Other: No pelvic mass or free pelvic fluid collections. No inguinal mass or adenopathy. Musculoskeletal: No significant bony findings. Benign-appearing hemangioma noted in the L2 vertebral body. Advanced facet disease noted in the lower lumbar spine. IMPRESSION: 1. CT findings consistent with a early or partial small bowel obstruction due to adhesions in the right lower quadrant. 2. No other significant abdominal/pelvic findings. 3. Status post cholecystectomy with mild  associated biliary dilatation. Electronically Signed   By: Rudie Meyer M.D.   On: 02/09/2018 16:11      Scheduled Meds: . diatrizoate meglumine-sodium  90 mL Per NG tube Once  . insulin aspart  0-9 Units Subcutaneous Q4H  . levothyroxine  56 mcg Intravenous Daily   Continuous Infusions: . sodium chloride 100 mL/hr at 02/10/18 0915     LOS: 1 day    Time spent in minutes: 35    Calvert Cantor, MD [facility] Pager: [email or web address] Password [facility] 02/10/2018, 12:42 PM

## 2018-02-11 ENCOUNTER — Inpatient Hospital Stay (HOSPITAL_COMMUNITY): Payer: Medicare Other

## 2018-02-11 DIAGNOSIS — J4520 Mild intermittent asthma, uncomplicated: Secondary | ICD-10-CM

## 2018-02-11 LAB — GLUCOSE, CAPILLARY
Glucose-Capillary: 119 mg/dL — ABNORMAL HIGH (ref 65–99)
Glucose-Capillary: 120 mg/dL — ABNORMAL HIGH (ref 65–99)
Glucose-Capillary: 127 mg/dL — ABNORMAL HIGH (ref 65–99)
Glucose-Capillary: 144 mg/dL — ABNORMAL HIGH (ref 65–99)
Glucose-Capillary: 154 mg/dL — ABNORMAL HIGH (ref 65–99)
Glucose-Capillary: 160 mg/dL — ABNORMAL HIGH (ref 65–99)

## 2018-02-11 LAB — BASIC METABOLIC PANEL
Anion gap: 8 (ref 5–15)
BUN: 13 mg/dL (ref 6–20)
CALCIUM: 8 mg/dL — AB (ref 8.9–10.3)
CO2: 25 mmol/L (ref 22–32)
CREATININE: 0.78 mg/dL (ref 0.44–1.00)
Chloride: 108 mmol/L (ref 101–111)
GFR calc Af Amer: >60 mL/min (ref >60)
Glucose, Bld: 127 mg/dL — ABNORMAL HIGH (ref 65–99)
Potassium: 4.4 mmol/L (ref 3.5–5.1)
SODIUM: 141 mmol/L (ref 135–145)

## 2018-02-11 MED ORDER — ATENOLOL 25 MG PO TABS
25.0000 mg | ORAL_TABLET | Freq: Two times a day (BID) | ORAL | Status: DC
  Administered 2018-02-11 – 2018-02-13 (×5): 25 mg via ORAL
  Filled 2018-02-11 (×5): qty 1

## 2018-02-11 MED ORDER — INSULIN ASPART 100 UNIT/ML ~~LOC~~ SOLN
0.0000 [IU] | Freq: Three times a day (TID) | SUBCUTANEOUS | Status: DC
  Administered 2018-02-11: 1 [IU] via SUBCUTANEOUS
  Administered 2018-02-12 (×2): 2 [IU] via SUBCUTANEOUS
  Administered 2018-02-12: 1 [IU] via SUBCUTANEOUS
  Administered 2018-02-13: 2 [IU] via SUBCUTANEOUS

## 2018-02-11 MED ORDER — LEVOTHYROXINE SODIUM 112 MCG PO TABS
112.0000 ug | ORAL_TABLET | Freq: Every day | ORAL | Status: DC
  Administered 2018-02-12 – 2018-02-13 (×2): 112 ug via ORAL
  Filled 2018-02-11 (×2): qty 1

## 2018-02-11 MED ORDER — SERTRALINE HCL 50 MG PO TABS
50.0000 mg | ORAL_TABLET | Freq: Every day | ORAL | Status: DC
  Administered 2018-02-11 – 2018-02-13 (×3): 50 mg via ORAL
  Filled 2018-02-11 (×3): qty 1

## 2018-02-11 MED ORDER — ROPINIROLE HCL 0.25 MG PO TABS
0.2500 mg | ORAL_TABLET | Freq: Every day | ORAL | Status: DC
  Administered 2018-02-11 – 2018-02-12 (×2): 0.25 mg via ORAL
  Filled 2018-02-11 (×2): qty 1

## 2018-02-11 MED ORDER — INSULIN ASPART 100 UNIT/ML ~~LOC~~ SOLN: 0.0000 [IU] | Freq: Every day | SUBCUTANEOUS | Status: DC

## 2018-02-11 NOTE — Progress Notes (Cosign Needed)
Patient had an uneventful day. Clear liquid diet resumed. Patient tolerated well.  NG removed. Patient tolerated well. Continue with plan of care.

## 2018-02-11 NOTE — Progress Notes (Signed)
Subjective/Chief Complaint: nausea 2 large BM less pain and bloating    Objective: Vital signs in last 24 hours: Temp:  [99.1 F (37.3 C)-100.4 F (38 C)] 99.1 F (37.3 C) (06/01 0917) Pulse Rate:  [67-78] 67 (06/01 0917) Resp:  [14-18] 16 (06/01 0917) BP: (113-135)/(55-85) 135/55 (06/01 0917) SpO2:  [85 %-99 %] 98 % (06/01 0917) Last BM Date: 02/07/18  Intake/Output from previous day: 05/31 0701 - 06/01 0700 In: 2760 [I.V.:2400; NG/GT:360] Out: 300 [Urine:300] Intake/Output this shift: No intake/output data recorded.  GI: soft ND min tenderness   Lab Results:  Recent Labs    02/09/18 1756 02/10/18 0433  WBC 12.0* 11.0*  HGB 14.9 13.8  HCT 44.9 42.8  PLT 242 198   BMET Recent Labs    02/10/18 0433 02/11/18 0404  NA 139 141  K 5.6* 4.4  CL 105 108  CO2 23 25  GLUCOSE 146* 127*  BUN 14 13  CREATININE 0.84 0.78  CALCIUM 8.7* 8.0*   PT/INR No results for input(s): LABPROT, INR in the last 72 hours. ABG No results for input(s): PHART, HCO3 in the last 72 hours.  Invalid input(s): PCO2, PO2  Studies/Results: Dg Abd 1 View  Result Date: 02/10/2018 CLINICAL DATA:  Evaluate partial small bowel obstruction EXAM: ABDOMEN - 1 VIEW COMPARISON:  CT 02/09/2018 FINDINGS: Dilated small bowel loops again noted. Gas and contrast material noted in the right colon. Findings compatible with partial small bowel obstruction, not significantly changed. Prior cholecystectomy. No free air or organomegaly. IMPRESSION: Continued partial small bowel obstruction pattern, not significantly changed. Electronically Signed   By: Charlett NoseKevin  Dover M.D.   On: 02/10/2018 08:59   Ct Abdomen Pelvis W Contrast  Result Date: 02/09/2018 CLINICAL DATA:  Mid to lower abdominal pain with nausea and vomiting for 5 days. History of prior hysterectomy, appendectomy and cholecystectomy. EXAM: CT ABDOMEN AND PELVIS WITH CONTRAST TECHNIQUE: Multidetector CT imaging of the abdomen and pelvis was performed  using the standard protocol following bolus administration of intravenous contrast. CONTRAST:  100mL ISOVUE-300 IOPAMIDOL (ISOVUE-300) INJECTION 61% COMPARISON:  CT scan 10/13/2009 FINDINGS: Lower chest: The lung bases are clear of acute process. No pleural effusion or pulmonary lesions. The heart is normal in size. No pericardial effusion. Aortic and coronary artery calcifications are noted. The distal esophagus and aorta are unremarkable. Hepatobiliary: No focal hepatic lesions. Status post cholecystectomy with mild intra and extrahepatic biliary dilatation, not significantly changed. Pancreas: No mass, inflammation or ductal dilatation. Spleen: Normal size.  No focal lesions. Adrenals/Urinary Tract: The adrenal glands and kidneys are unremarkable. No renal, ureteral or bladder calculi or mass. Stomach/Bowel: The stomach is unremarkable. The duodenum is normal. There are dilated small bowel loops with air-fluid levels into the mid pelvic area. Transition to normal/decompressed distal small bowel loops most likely due to adhesions. Caliber change noted in the right mid abdomen demonstrated best on the coronal series images 49, 50 and 51. No masses identified. The terminal ileum is normal. The colons unremarkable. No acute inflammatory changes or obstructive findings. Moderate stool. Vascular/Lymphatic: Stable scattered atherosclerotic calcifications involving the aorta and iliac arteries. No aneurysm or dissection. The branch vessels are patent. The major venous structures are patent. No mesenteric or retroperitoneal mass or adenopathy. Reproductive: Surgically absent. Other: No pelvic mass or free pelvic fluid collections. No inguinal mass or adenopathy. Musculoskeletal: No significant bony findings. Benign-appearing hemangioma noted in the L2 vertebral body. Advanced facet disease noted in the lower lumbar spine. IMPRESSION: 1. CT findings  consistent with a early or partial small bowel obstruction due to  adhesions in the right lower quadrant. 2. No other significant abdominal/pelvic findings. 3. Status post cholecystectomy with mild associated biliary dilatation. Electronically Signed   By: Rudie Meyer M.D.   On: 02/09/2018 16:11   Dg Abd Portable 1v-small Bowel Obstruction Protocol-initial, 8 Hr Delay  Result Date: 02/11/2018 CLINICAL DATA:  Small-bowel obstruction protocol. 8 hour delayed image. EXAM: PORTABLE ABDOMEN - 1 VIEW COMPARISON:  Abdominal radiograph performed 02/10/2018 FINDINGS: Contrast has progressed to the colon. There is no definite evidence for bowel obstruction. Distended small bowel loops may simply reflect dysmotility. The patient's enteric tube is noted ending overlying the fundus of the stomach, with the side port at the distal esophagus. Clips are noted within the right upper quadrant, reflecting prior cholecystectomy. No acute osseous abnormalities are seen. IMPRESSION: Contrast has progressed to the colon. No definite evidence for bowel obstruction. Distended small bowel loops may simply reflect dysmotility. Electronically Signed   By: Roanna Raider M.D.   On: 02/11/2018 03:02   Dg Vangie Bicker G Tube Plc W/fl W/rad  Result Date: 02/10/2018 CLINICAL DATA:  NG tube placement EXAM: NASO G TUBE PLACEMENT WITH FL AND WITH RAD FLUOROSCOPY TIME:  Fluoroscopy Time:  1.1 minutes Radiation Exposure Index (if provided by the fluoroscopic device): 21.8 mGy Number of Acquired Spot Images: 0 COMPARISON:  None. FINDINGS: NG tube was placed under fluoroscopic guidance. The tip is in the proximal to mid stomach and the side port in the proximal stomach. IMPRESSION: Successful NG tube placement under fluoroscopic guidance. Electronically Signed   By: Charlett Nose M.D.   On: 02/10/2018 16:50    Anti-infectives: Anti-infectives (From admission, onward)   None      Assessment/Plan: Patient Active Problem List   Diagnosis Date Noted  . SBO (small bowel obstruction) (HCC) 02/09/2018  . Allergic  rhinitis 01/09/2018  . Diabetic neuropathy (HCC) 06/30/2017  . Vitamin D deficiency 06/30/2017  . Aortic regurgitation 06/30/2017  . Mitral valve prolapse 06/30/2017  . Anxiety and depression 06/30/2017  . History of nocturia 01/17/2015  . OSA on CPAP 01/17/2015  . Asthma, chronic 09/25/2014  . Nocturia more than twice per night 09/25/2014  . Diabetic neuropathy, type II diabetes mellitus (HCC) 09/25/2014  . Primary snoring 09/25/2014  . Myalgia 09/25/2014  . Diabetes mellitus (HCC)   . Goiter   . Depression   . Asthma   . SVT (supraventricular tachycardia) (HCC)   . Vasculitis (HCC)   . GERD (gastroesophageal reflux disease)   . FIBROMYALGIA 07/14/2009  . PROTEINURIA, MILD 07/14/2009  . DIABETIC PERIPHERAL NEUROPATHY 12/04/2007  . Hyperlipidemia 12/04/2007  . SUPRAVENTRICULAR TACHYCARDIA, HX OF 12/04/2007  . DIVERTICULOSIS, COLON 07/12/2007  . Hypothyroidism 04/06/2007  . DIABETES MELLITUS, TYPE I 04/06/2007  . DEPRESSION 04/06/2007     Advance diet  D/C  NGT   LOS: 2 days    Maisie Fus A Daryana Whirley 02/11/2018

## 2018-02-11 NOTE — Progress Notes (Signed)
PROGRESS NOTE    ELSE HABERMANN   WUJ:[id number]  DOB: 1951/04/12  DOA: 02/09/2018 PCP: Dorena Bodo, PA-C   Brief Narrative:  LONITA DEBES is a 67 y.o. female with medical history significant for diabetes with neuropathy, asthma, hypothyroidism, SVT, history of SBO, who was sent from PCPs office for nausea, vomiting and abdominal pain for 4 days.  Found to have a SBO on CT.    Subjective: Having BMs now.      Assessment & Plan:   Principal Problem:   SBO (small bowel obstruction)  - xray 5/31 showed persistence of obstruction- gen surgery has ordered and NG tube and small bowel protocol- dye has reached to the colon but distension of small bowel persists - she is having BMs - gen surgery d/c'd NG and ordered a clear liquid diet - cont IVF but cut NS down to 50cc/hr    Active Problems:   Hypothyroidism - oral Synthroid changed to IV     Diabetes mellitus  - Insulin pump is off- cont SSI - change to TID with meals and HS    Depression - need to hold Zoloft and xanax due to sBO - having a panic attack during NG placement prohibiting placement- PRN Ativan IV ordered  HTN/ SVT (supraventricular tachycardia)  - hold Norvasc, Cozaar, HCTZ  - BP stable - in place of Atenolol, give IV Lopressor 2.5ing  TID    Asthma  - cont Albuterol    OSA on CPAP - CPAP ordered  DVT prophylaxis: SCDs Code Status: Full code Family Communication:  Disposition Plan: per sugery Consultants:   General surgery Procedures:   NG tube placed by radiology- small bowel protocol Antimicrobials:  Anti-infectives (From admission, onward)   None       Objective: Vitals:   02/10/18 1252 02/10/18 2014 02/11/18 0357 02/11/18 0917  BP:  (!) 122/55 (!) 122/55 (!) 135/55  Pulse:  70 70 67  Resp:  16 18 16   Temp:  99.5 F (37.5 C) 99.5 F (37.5 C) 99.1 F (37.3 C)  TempSrc:  Oral Oral Oral  SpO2: 97% 99% 95% 98%  Weight:      Height:        Intake/Output Summary (Last  24 hours) at 02/11/2018 1203 Last data filed at 02/11/2018 0600 Gross per 24 hour  Intake 2360 ml  Output 300 ml  Net 2060 ml   Filed Weights   02/09/18 1647 02/09/18 2136  Weight: 76.7 kg (169 lb) 78.1 kg (172 lb 1.6 oz)    Examination: General exam: Appears comfortable  HEENT: PERRLA, oral mucosa moist, no sclera icterus or thrush Respiratory system: Clear to auscultation. Respiratory effort normal. Cardiovascular system: S1 & S2 heard, RRR.   Gastrointestinal system: Abdomen soft, non-tender, still moderately distended. Normal bowel sounds.   Central nervous system: Alert and oriented. No focal neurological deficits. Extremities: No cyanosis, clubbing or edema Skin: No rashes or ulcers Psychiatry:  Mood & affect appropriate.     Data Reviewed: I have personally reviewed following labs and imaging studies  CBC: Recent Labs  Lab 02/09/18 1051 02/09/18 1756 02/10/18 0433  WBC 13.2* 12.0* 11.0*  NEUTROABS 10,085* 8.5*  --   HGB 14.1 14.9 13.8  HCT 41.4 44.9 42.8  MCV 81.7 86.0 87.9  PLT 267 242 198   Basic Metabolic Panel: Recent Labs  Lab 02/09/18 1051 02/09/18 1756 02/10/18 0433 02/11/18 0404  NA 139 139 139 141  K 4.8 4.6 5.6* 4.4  CL  103 101 105 108  CO2 27 29 23 25   GLUCOSE 104* 71 146* 127*  BUN 16 17 14 13   CREATININE 0.97 0.91 0.84 0.78  CALCIUM 9.5 9.8 8.7* 8.0*   GFR: Estimated Creatinine Clearance: 66.6 mL/min (by C-G formula based on SCr of 0.78 mg/dL). Liver Function Tests: Recent Labs  Lab 02/09/18 1051 02/09/18 1756  AST 16 21  ALT 10 15  ALKPHOS  --  58  BILITOT 0.7 0.7  PROT 6.8 8.0  ALBUMIN  --  4.3   Recent Labs  Lab 02/09/18 1756  LIPASE 23   No results for input(s): AMMONIA in the last 168 hours. Coagulation Profile: No results for input(s): INR, PROTIME in the last 168 hours. Cardiac Enzymes: No results for input(s): CKTOTAL, CKMB, CKMBINDEX, TROPONINI in the last 168 hours. BNP (last 3 results) No results for input(s):  PROBNP in the last 8760 hours. HbA1C: No results for input(s): HGBA1C in the last 72 hours. CBG: Recent Labs  Lab 02/10/18 2016 02/11/18 0007 02/11/18 0353 02/11/18 0739 02/11/18 1104  GLUCAP 111* 127* 119* 154* 120*   Lipid Profile: No results for input(s): CHOL, HDL, LDLCALC, TRIG, CHOLHDL, LDLDIRECT in the last 72 hours. Thyroid Function Tests: No results for input(s): TSH, T4TOTAL, FREET4, T3FREE, THYROIDAB in the last 72 hours. Anemia Panel: No results for input(s): VITAMINB12, FOLATE, FERRITIN, TIBC, IRON, RETICCTPCT in the last 72 hours. Urine analysis:    Component Value Date/Time   COLORURINE STRAW (A) 02/09/2018 1907   APPEARANCEUR CLEAR 02/09/2018 1907   LABSPEC 1.019 02/09/2018 1907   PHURINE 7.0 02/09/2018 1907   GLUCOSEU 50 (A) 02/09/2018 1907   HGBUR NEGATIVE 02/09/2018 1907   BILIRUBINUR NEGATIVE 02/09/2018 1907   KETONESUR NEGATIVE 02/09/2018 1907   PROTEINUR NEGATIVE 02/09/2018 1907   UROBILINOGEN 0.2 05/30/2008 2345   NITRITE NEGATIVE 02/09/2018 1907   LEUKOCYTESUR NEGATIVE 02/09/2018 1907   Sepsis Labs: @LABRCNTIP (procalcitonin:4,lacticidven:4) )No results found for this or any previous visit (from the past 240 hour(s)).       Radiology Studies: Dg Abd 1 View  Result Date: 02/10/2018 CLINICAL DATA:  Evaluate partial small bowel obstruction EXAM: ABDOMEN - 1 VIEW COMPARISON:  CT 02/09/2018 FINDINGS: Dilated small bowel loops again noted. Gas and contrast material noted in the right colon. Findings compatible with partial small bowel obstruction, not significantly changed. Prior cholecystectomy. No free air or organomegaly. IMPRESSION: Continued partial small bowel obstruction pattern, not significantly changed. Electronically Signed   By: Charlett NoseKevin  Dover M.D.   On: 02/10/2018 08:59   Ct Abdomen Pelvis W Contrast  Result Date: 02/09/2018 CLINICAL DATA:  Mid to lower abdominal pain with nausea and vomiting for 5 days. History of prior hysterectomy,  appendectomy and cholecystectomy. EXAM: CT ABDOMEN AND PELVIS WITH CONTRAST TECHNIQUE: Multidetector CT imaging of the abdomen and pelvis was performed using the standard protocol following bolus administration of intravenous contrast. CONTRAST:  100mL ISOVUE-300 IOPAMIDOL (ISOVUE-300) INJECTION 61% COMPARISON:  CT scan 10/13/2009 FINDINGS: Lower chest: The lung bases are clear of acute process. No pleural effusion or pulmonary lesions. The heart is normal in size. No pericardial effusion. Aortic and coronary artery calcifications are noted. The distal esophagus and aorta are unremarkable. Hepatobiliary: No focal hepatic lesions. Status post cholecystectomy with mild intra and extrahepatic biliary dilatation, not significantly changed. Pancreas: No mass, inflammation or ductal dilatation. Spleen: Normal size.  No focal lesions. Adrenals/Urinary Tract: The adrenal glands and kidneys are unremarkable. No renal, ureteral or bladder calculi or mass. Stomach/Bowel: The stomach  is unremarkable. The duodenum is normal. There are dilated small bowel loops with air-fluid levels into the mid pelvic area. Transition to normal/decompressed distal small bowel loops most likely due to adhesions. Caliber change noted in the right mid abdomen demonstrated best on the coronal series images 49, 50 and 51. No masses identified. The terminal ileum is normal. The colons unremarkable. No acute inflammatory changes or obstructive findings. Moderate stool. Vascular/Lymphatic: Stable scattered atherosclerotic calcifications involving the aorta and iliac arteries. No aneurysm or dissection. The branch vessels are patent. The major venous structures are patent. No mesenteric or retroperitoneal mass or adenopathy. Reproductive: Surgically absent. Other: No pelvic mass or free pelvic fluid collections. No inguinal mass or adenopathy. Musculoskeletal: No significant bony findings. Benign-appearing hemangioma noted in the L2 vertebral body.  Advanced facet disease noted in the lower lumbar spine. IMPRESSION: 1. CT findings consistent with a early or partial small bowel obstruction due to adhesions in the right lower quadrant. 2. No other significant abdominal/pelvic findings. 3. Status post cholecystectomy with mild associated biliary dilatation. Electronically Signed   By: Rudie Meyer M.D.   On: 02/09/2018 16:11   Dg Abd Portable 1v-small Bowel Obstruction Protocol-initial, 8 Hr Delay  Result Date: 02/11/2018 CLINICAL DATA:  Small-bowel obstruction protocol. 8 hour delayed image. EXAM: PORTABLE ABDOMEN - 1 VIEW COMPARISON:  Abdominal radiograph performed 02/10/2018 FINDINGS: Contrast has progressed to the colon. There is no definite evidence for bowel obstruction. Distended small bowel loops may simply reflect dysmotility. The patient's enteric tube is noted ending overlying the fundus of the stomach, with the side port at the distal esophagus. Clips are noted within the right upper quadrant, reflecting prior cholecystectomy. No acute osseous abnormalities are seen. IMPRESSION: Contrast has progressed to the colon. No definite evidence for bowel obstruction. Distended small bowel loops may simply reflect dysmotility. Electronically Signed   By: Roanna Raider M.D.   On: 02/11/2018 03:02   Dg Vangie Bicker G Tube Plc W/fl W/rad  Result Date: 02/10/2018 CLINICAL DATA:  NG tube placement EXAM: NASO G TUBE PLACEMENT WITH FL AND WITH RAD FLUOROSCOPY TIME:  Fluoroscopy Time:  1.1 minutes Radiation Exposure Index (if provided by the fluoroscopic device): 21.8 mGy Number of Acquired Spot Images: 0 COMPARISON:  None. FINDINGS: NG tube was placed under fluoroscopic guidance. The tip is in the proximal to mid stomach and the side port in the proximal stomach. IMPRESSION: Successful NG tube placement under fluoroscopic guidance. Electronically Signed   By: Charlett Nose M.D.   On: 02/10/2018 16:50      Scheduled Meds: . bisacodyl  10 mg Rectal Daily  .  insulin aspart  0-9 Units Subcutaneous Q4H  . levothyroxine  56 mcg Intravenous Daily  . lip balm  1 application Topical BID  . metoprolol tartrate  2.5 mg Intravenous Q8H   Continuous Infusions: . sodium chloride 100 mL/hr at 02/11/18 0353  . lactated ringers    . methocarbamol (ROBAXIN)  IV       LOS: 2 days    Time spent in minutes: 35    Calvert Cantor, MD [facility] Pager: [email or web address] Password [facility] 02/11/2018, 12:03 PM

## 2018-02-12 LAB — COMPLETE METABOLIC PANEL WITH GFR
AG Ratio: 1.5 (calc) (ref 1.0–2.5)
ALBUMIN MSPROF: 4.1 g/dL (ref 3.6–5.1)
ALT: 10 U/L (ref 6–29)
AST: 16 U/L (ref 10–35)
Alkaline phosphatase (APISO): 58 U/L (ref 33–130)
BUN: 16 mg/dL (ref 7–25)
CHLORIDE: 103 mmol/L (ref 98–110)
CO2: 27 mmol/L (ref 20–32)
CREATININE: 0.97 mg/dL (ref 0.50–0.99)
Calcium: 9.5 mg/dL (ref 8.6–10.4)
GFR, Est African American: 70 mL/min/{1.73_m2} (ref >=60)
GFR, Est Non African American: 60 mL/min/{1.73_m2} (ref >=60)
GLUCOSE: 104 mg/dL — AB (ref 65–99)
Globulin: 2.7 g/dL (calc) (ref 1.9–3.7)
Potassium: 4.8 mmol/L (ref 3.5–5.3)
Sodium: 139 mmol/L (ref 135–146)
Total Bilirubin: 0.7 mg/dL (ref 0.2–1.2)
Total Protein: 6.8 g/dL (ref 6.1–8.1)

## 2018-02-12 LAB — GLUCOSE, CAPILLARY
GLUCOSE-CAPILLARY: 169 mg/dL — AB (ref 65–99)
Glucose-Capillary: 126 mg/dL — ABNORMAL HIGH (ref 65–99)
Glucose-Capillary: 156 mg/dL — ABNORMAL HIGH (ref 65–99)
Glucose-Capillary: 182 mg/dL — ABNORMAL HIGH (ref 65–99)

## 2018-02-12 LAB — CBC WITH DIFFERENTIAL/PLATELET
BASOS ABS: 66 {cells}/uL (ref 0–200)
Basophils Relative: 0.5 %
EOS PCT: 0.5 %
Eosinophils Absolute: 66 cells/uL (ref 15–500)
HCT: 41.4 % (ref 35.0–45.0)
HEMOGLOBIN: 14.1 g/dL (ref 11.7–15.5)
Lymphs Abs: 2244 cells/uL (ref 850–3900)
MCH: 27.8 pg (ref 27.0–33.0)
MCHC: 34.1 g/dL (ref 32.0–36.0)
MCV: 81.7 fL (ref 80.0–100.0)
MONOS PCT: 5.6 %
MPV: 9.3 fL (ref 7.5–12.5)
Neutro Abs: 10085 cells/uL — ABNORMAL HIGH (ref 1500–7800)
Neutrophils Relative %: 76.4 %
PLATELETS: 267 10*3/uL (ref 140–400)
RBC: 5.07 10*6/uL (ref 3.80–5.10)
RDW: 13.1 % (ref 11.0–15.0)
TOTAL LYMPHOCYTE: 17 %
WBC mixed population: 739 cells/uL (ref 200–950)
WBC: 13.2 10*3/uL — AB (ref 3.8–10.8)

## 2018-02-12 NOTE — Progress Notes (Signed)
   Subjective/Chief Complaint: nausea  Tolerating diet no pain multiple BM    Objective: Vital signs in last 24 hours: Temp:  [98.1 F (36.7 C)-99.1 F (37.3 C)] 98.1 F (36.7 C) (06/02 0435) Pulse Rate:  [62-74] 68 (06/02 0435) Resp:  [16] 16 (06/02 0435) BP: (121-139)/(50-60) 139/60 (06/02 0435) SpO2:  [93 %-100 %] 98 % (06/02 0435) Last BM Date: 02/11/18  Intake/Output from previous day: 06/01 0701 - 06/02 0700 In: 1500 [I.V.:1500] Out: -  Intake/Output this shift: No intake/output data recorded.  General appearance: alert and cooperative Head: Normocephalic, without obvious abnormality, atraumatic GI: soft, non-tender; bowel sounds normal; no masses,  no organomegaly  Lab Results:  Recent Labs    02/09/18 1756 02/10/18 0433  WBC 12.0* 11.0*  HGB 14.9 13.8  HCT 44.9 42.8  PLT 242 198   BMET Recent Labs    02/10/18 0433 02/11/18 0404  NA 139 141  K 5.6* 4.4  CL 105 108  CO2 23 25  GLUCOSE 146* 127*  BUN 14 13  CREATININE 0.84 0.78  CALCIUM 8.7* 8.0*   PT/INR No results for input(s): LABPROT, INR in the last 72 hours. ABG No results for input(s): PHART, HCO3 in the last 72 hours.  Invalid input(s): PCO2, PO2  Studies/Results: Dg Abd Portable 1v-small Bowel Obstruction Protocol-initial, 8 Hr Delay  Result Date: 02/11/2018 CLINICAL DATA:  Small-bowel obstruction protocol. 8 hour delayed image. EXAM: PORTABLE ABDOMEN - 1 VIEW COMPARISON:  Abdominal radiograph performed 02/10/2018 FINDINGS: Contrast has progressed to the colon. There is no definite evidence for bowel obstruction. Distended small bowel loops may simply reflect dysmotility. The patient's enteric tube is noted ending overlying the fundus of the stomach, with the side port at the distal esophagus. Clips are noted within the right upper quadrant, reflecting prior cholecystectomy. No acute osseous abnormalities are seen. IMPRESSION: Contrast has progressed to the colon. No definite evidence for  bowel obstruction. Distended small bowel loops may simply reflect dysmotility. Electronically Signed   By: Roanna RaiderJeffery  Chang M.D.   On: 02/11/2018 03:02   Dg Vangie BickerNaso G Tube Plc W/fl W/rad  Result Date: 02/10/2018 CLINICAL DATA:  NG tube placement EXAM: NASO G TUBE PLACEMENT WITH FL AND WITH RAD FLUOROSCOPY TIME:  Fluoroscopy Time:  1.1 minutes Radiation Exposure Index (if provided by the fluoroscopic device): 21.8 mGy Number of Acquired Spot Images: 0 COMPARISON:  None. FINDINGS: NG tube was placed under fluoroscopic guidance. The tip is in the proximal to mid stomach and the side port in the proximal stomach. IMPRESSION: Successful NG tube placement under fluoroscopic guidance. Electronically Signed   By: Charlett NoseKevin  Dover M.D.   On: 02/10/2018 16:50    Anti-infectives: Anti-infectives (From admission, onward)   None      Assessment/Plan: p SBO resolving  Advance diet  Soft diet  OOB Home Monday if no more pain nausea or vomiting     LOS: 3 days    [name] 02/12/2018

## 2018-02-12 NOTE — Progress Notes (Signed)
PROGRESS NOTE    [name]   VHQ:[id number]  DOB: 02/03/1951  DOA: 02/09/2018 PCP: Dorena Bodo, PA-C   Brief Narrative:  [name] is a 67 y.o. female with medical history significant for diabetes with neuropathy, asthma, hypothyroidism, SVT, history of SBO, who was sent from PCPs office for nausea, vomiting and abdominal pain for 4 days.  Found to have a SBO on CT.    Subjective: Tolerating clear liquids. Abdomen still distended.     Assessment & Plan:   Principal Problem:   SBO (small bowel obstruction)  - xray 5/31 showed persistence of obstruction- gen surgery has ordered and NG tube and small bowel protocol- dye has reached to the colon but distension of small bowel persists - she is having BMs - gen surgery d/c'd NG and ordered a clear liquid diet- have advanced to full- d/c tomorrow per gen surgery if tolerating food - cont IVF @ 50cc/hr    Active Problems:   Hypothyroidism - oral Synthroid      Diabetes mellitus  - Insulin pump is off- cont SSI - change to TID with meals and HS - sugars well controlled    Depression - need to hold Zoloft and xanax due to sBO - having a panic attack during NG placement prohibiting placement- PRN Ativan IV ordered  HTN/ SVT (supraventricular tachycardia)  - hold Norvasc, Cozaar, HCTZ  - - cont Atenolol   BP stable    Asthma  - cont Albuterol    OSA on CPAP - CPAP ordered  DVT prophylaxis: SCDs Code Status: Full code Family Communication:  Disposition Plan: per sugery Consultants:   General surgery Procedures:   NG tube placed by radiology- small bowel protocol Antimicrobials:  Anti-infectives (From admission, onward)   None       Objective: Vitals:   02/11/18 1458 02/11/18 1700 02/11/18 2131 02/12/18 0435  BP: (!) 121/56 128/60 (!) 127/53 139/60  Pulse: 69 74 62 68  Resp:   16 16  Temp:   98.6 F (37 C) 98.1 F (36.7 C)  TempSrc:   Oral Oral  SpO2:  93% 98% 98%  Weight:        Height:        Intake/Output Summary (Last 24 hours) at 02/12/2018 1219 Last data filed at 02/12/2018 0600 Gross per 24 hour  Intake 1100 ml  Output -  Net 1100 ml   Filed Weights   02/09/18 1647 02/09/18 2136  Weight: 76.7 kg (169 lb) 78.1 kg (172 lb 1.6 oz)    Examination: General exam: Appears comfortable  HEENT: PERRLA, oral mucosa moist, no sclera icterus or thrush Respiratory system: Clear to auscultation. Respiratory effort normal. Cardiovascular system: S1 & S2 heard, RRR.   Gastrointestinal system: Abdomen soft, non-tender, still moderately distended. Normal bowel sounds.  No change today Central nervous system: Alert and oriented. No focal neurological deficits. Extremities: No cyanosis, clubbing or edema Skin: No rashes or ulcers Psychiatry:  Mood & affect appropriate.     Data Reviewed: I have personally reviewed following labs and imaging studies  CBC: Recent Labs  Lab 02/09/18 1051 02/09/18 1756 02/10/18 0433  WBC 13.2* 12.0* 11.0*  NEUTROABS 10,085* 8.5*  --   HGB 14.1 14.9 13.8  HCT 41.4 44.9 42.8  MCV 81.7 86.0 87.9  PLT 267 242 198   Basic Metabolic Panel: Recent Labs  Lab 02/09/18 1051 02/09/18 1756 02/10/18 0433 02/11/18 0404  NA 139 139 139 141  K 4.8 4.6  5.6* 4.4  CL 103 101 105 108  CO2 27 29 23 25   GLUCOSE 104* 71 146* 127*  BUN 16 17 14 13   CREATININE 0.97 0.91 0.84 0.78  CALCIUM 9.5 9.8 8.7* 8.0*   GFR: Estimated Creatinine Clearance: 66.6 mL/min (by C-G formula based on SCr of 0.78 mg/dL). Liver Function Tests: Recent Labs  Lab 02/09/18 1051 02/09/18 1756  AST 16 21  ALT 10 15  ALKPHOS  --  58  BILITOT 0.7 0.7  PROT 6.8 8.0  ALBUMIN  --  4.3   Recent Labs  Lab 02/09/18 1756  LIPASE 23   No results for input(s): AMMONIA in the last 168 hours. Coagulation Profile: No results for input(s): INR, PROTIME in the last 168 hours. Cardiac Enzymes: No results for input(s): CKTOTAL, CKMB, CKMBINDEX, TROPONINI in the last  168 hours. BNP (last 3 results) No results for input(s): PROBNP in the last 8760 hours. HbA1C: No results for input(s): HGBA1C in the last 72 hours. CBG: Recent Labs  Lab 02/11/18 0739 02/11/18 1104 02/11/18 1616 02/11/18 2133 02/12/18 0717  GLUCAP 154* 120* 144* 160* 126*   Lipid Profile: No results for input(s): CHOL, HDL, LDLCALC, TRIG, CHOLHDL, LDLDIRECT in the last 72 hours. Thyroid Function Tests: No results for input(s): TSH, T4TOTAL, FREET4, T3FREE, THYROIDAB in the last 72 hours. Anemia Panel: No results for input(s): VITAMINB12, FOLATE, FERRITIN, TIBC, IRON, RETICCTPCT in the last 72 hours. Urine analysis:    Component Value Date/Time   COLORURINE STRAW (A) 02/09/2018 1907   APPEARANCEUR CLEAR 02/09/2018 1907   LABSPEC 1.019 02/09/2018 1907   PHURINE 7.0 02/09/2018 1907   GLUCOSEU 50 (A) 02/09/2018 1907   HGBUR NEGATIVE 02/09/2018 1907   BILIRUBINUR NEGATIVE 02/09/2018 1907   KETONESUR NEGATIVE 02/09/2018 1907   PROTEINUR NEGATIVE 02/09/2018 1907   UROBILINOGEN 0.2 05/30/2008 2345   NITRITE NEGATIVE 02/09/2018 1907   LEUKOCYTESUR NEGATIVE 02/09/2018 1907   Sepsis Labs: @LABRCNTIP (procalcitonin:4,lacticidven:4) )No results found for this or any previous visit (from the past 240 hour(s)).       Radiology Studies: Dg Abd Portable 1v-small Bowel Obstruction Protocol-initial, 8 Hr Delay  Result Date: 02/11/2018 CLINICAL DATA:  Small-bowel obstruction protocol. 8 hour delayed image. EXAM: PORTABLE ABDOMEN - 1 VIEW COMPARISON:  Abdominal radiograph performed 02/10/2018 FINDINGS: Contrast has progressed to the colon. There is no definite evidence for bowel obstruction. Distended small bowel loops may simply reflect dysmotility. The patient's enteric tube is noted ending overlying the fundus of the stomach, with the side port at the distal esophagus. Clips are noted within the right upper quadrant, reflecting prior cholecystectomy. No acute osseous abnormalities are  seen. IMPRESSION: Contrast has progressed to the colon. No definite evidence for bowel obstruction. Distended small bowel loops may simply reflect dysmotility. Electronically Signed   By: Roanna Raider M.D.   On: 02/11/2018 03:02   Dg Vangie Bicker G Tube Plc W/fl W/rad  Result Date: 02/10/2018 CLINICAL DATA:  NG tube placement EXAM: NASO G TUBE PLACEMENT WITH FL AND WITH RAD FLUOROSCOPY TIME:  Fluoroscopy Time:  1.1 minutes Radiation Exposure Index (if provided by the fluoroscopic device): 21.8 mGy Number of Acquired Spot Images: 0 COMPARISON:  None. FINDINGS: NG tube was placed under fluoroscopic guidance. The tip is in the proximal to mid stomach and the side port in the proximal stomach. IMPRESSION: Successful NG tube placement under fluoroscopic guidance. Electronically Signed   By: Charlett Nose M.D.   On: 02/10/2018 16:50      Scheduled Meds: .  atenolol  25 mg Oral BID  . bisacodyl  10 mg Rectal Daily  . insulin aspart  0-5 Units Subcutaneous QHS  . insulin aspart  0-9 Units Subcutaneous TID WC  . levothyroxine  112 mcg Oral QAC breakfast  . lip balm  1 application Topical BID  . rOPINIRole  0.25 mg Oral QHS  . sertraline  50 mg Oral Daily   Continuous Infusions: . sodium chloride 50 mL/hr at 02/11/18 2012  . lactated ringers    . methocarbamol (ROBAXIN)  IV       LOS: 3 days    Time spent in minutes: 35    Calvert CantorSaima Rebakah Cokley, MD [facility] Pager: [email or web address] Password [email or web address] 02/12/2018, 12:19 PM

## 2018-02-13 LAB — GLUCOSE, CAPILLARY: Glucose-Capillary: 176 mg/dL — ABNORMAL HIGH (ref 65–99)

## 2018-02-13 MED ORDER — SIMETHICONE 80 MG PO CHEW: 80.0000 mg | CHEWABLE_TABLET | Freq: Four times a day (QID) | ORAL | 2 refills | Status: DC | PRN

## 2018-02-13 MED ORDER — HYDROCORTISONE 2.5 % RE CREA: 1.0000 "application " | TOPICAL_CREAM | Freq: Four times a day (QID) | RECTAL | 0 refills | Status: DC | PRN

## 2018-02-13 NOTE — Progress Notes (Signed)
Discharge instructions reviewed with patient utilizing teach back method no questions at this time. Patient discharged to home. ?

## 2018-02-13 NOTE — Discharge Summary (Signed)
Physician Discharge Summary  MEE MACDONNELL VZS:[id number] DOB: 03/24/1951 DOA: 02/09/2018  PCP: Orlena Sheldon, PA-C  Admit date: 02/09/2018 Discharge date: 02/13/2018  Admitted From: home Disposition:  home    Discharge Condition:  stable   CODE STATUS:  Full code  Consultations:  gen surgery    Discharge Diagnoses:  Principal Problem:   SBO (small bowel obstruction) ([place]) Active Problems:   Hypothyroidism   Diabetes mellitus ([place])   Depression   Asthma   SVT (supraventricular tachycardia) ([facility])   Asthma, chronic   OSA on CPAP    Subjective: Tolerating solid food. Had a normal BM today.   Brief Summary: [name] is a 67 y.o.femalewith medical history significantfordiabetes with neuropathy,asthma,hypothyroidism,SVT,history of SBO,whowas sentfrom PCPs officefor nausea, vomiting and abdominal pain for 4 days.  Found to have a SBO on CT.     Hospital Course:  Principal Problem:   SBO (small bowel obstruction)  - xray 5/31 showed persistence of obstruction- gen surgery has ordered and NG tube and small bowel protocol- dye has reached to the colon but distension of small bowel persists - she is having BMs - 6/2- gen surgery d/c'd NG and ordered a clear liquid diet- have advanced to full- d/c tomorrow per gen surgery if tolerating food - 6/3- doing great with solids - abdomen is still distended however- have recommended Gas X prn    Active Problems:   Hypothyroidism - oral Synthroid      Diabetes mellitus  - can resume Insulin pump      Depression  Zoloft and xanax      HTN/ SVT (supraventricular tachycardia)  - at home she takes  Norvasc, Cozaar, HCTZ , Atenolol - all resumed except HCTZ - instructions given when to resume it      Asthma  - cont Albuterol    OSA on CPAP - CPAP ordered    Discharge Exam: Vitals:   02/13/18 0947 02/13/18 0948  BP: (!) 151/62   Pulse: (!) 55 (!) 59  Resp: 18   Temp:    SpO2: 97%     Vitals:   02/12/18 2125 02/13/18 0628 02/13/18 0947 02/13/18 0948  BP: (!) 151/62 (!) 161/55 (!) 151/62   Pulse: 64 (!) 57 (!) 55 (!) 59  Resp: _0 Temp: 98.7 F (37.1 C) 99.1 F (37.3 C)    TempSrc: Oral Oral    SpO2: 99% 97% 97%   Weight:      Height:        General: Pt is alert, awake, not in acute distress Cardiovascular: RRR, S1/S2 +, no rubs, no gallops Respiratory: CTA bilaterally, no wheezing, no rhonchi Abdominal: Soft, moderately distended, tympanic, non tender, bowel sounds + Extremities: no edema, no cyanosis   Discharge Instructions  Discharge Instructions    Diet - low sodium heart healthy   Complete by:  As directed    Diet Carb Modified   Complete by:  As directed    Discharge instructions   Complete by:  As directed    HCTZ is on hold for now-  Check BP daily and if top number is > 130 then resume HCTZ.   Increase activity slowly   Complete by:  As directed      Allergies as of 02/13/2018      Reactions   Penicillins Anaphylaxis   Has patient had a PCN reaction causing immediate rash, facial/tongue/throat swelling, SOB or lightheadedness with hypotension: No Has patient had a  PCN reaction causing severe rash involving mucus membranes or skin necrosis: No Has patient had a PCN reaction that required hospitalization: Yes/ Has patient had a PCN reaction occurring within the last 10 years: No If all of the above answers are "NO", then may proceed with Cephalosporin use.   Ace Inhibitors    INDUCED BRONCHOSPASM      Medication List    STOP taking these medications   hydrochlorothiazide 25 MG tablet Commonly known as:  HYDRODIURIL     TAKE these medications   albuterol 108 (90 Base) MCG/ACT inhaler Commonly known as:  PROVENTIL HFA;VENTOLIN HFA Inhale 2 puffs into the lungs every 6 (six) hours as needed.   albuterol (2.5 MG/3ML) 0.083% nebulizer solution Commonly known as:  PROVENTIL Take 3 mLs (2.5 mg total) by nebulization every 6  (six) hours as needed. [id number]:H73.428   ALPRAZolam 1 MG tablet Commonly known as:  XANAX TAKE ONE-HALF TABLET BY MOUTH ONCE DAILY AS NEEDED FOR STRESS   amLODipine 10 MG tablet Commonly known as:  NORVASC One a day   aspirin 81 MG tablet Take 81 mg by mouth daily.   atenolol 25 MG tablet Commonly known as:  TENORMIN Take 1 tablet (25 mg total) by mouth 2 (two) times daily.   atorvastatin 40 MG tablet Commonly known as:  LIPITOR Take 1 tablet (40 mg total) daily by mouth.   augmented betamethasone dipropionate 0.05 % ointment Commonly known as:  DIPROLENE-AF Apply topically 2 (two) times daily.   BIOTIN PO Take 10,000 mcg by mouth daily.   carboxymethylcellulose 0.5 % Soln Commonly known as:  REFRESH PLUS Place 1 drop into both eyes 2 (two) times daily as needed (dry eyes).   CINNAMON PO Take 1,000 mg by mouth 2 (two) times daily.   GLUCOSE PO Take by mouth as needed.   hydrocortisone 2.5 % rectal cream Commonly known as:  ANUSOL-HC Apply 1 application topically 4 (four) times daily as needed for hemorrhoids.   levocetirizine 5 MG tablet Commonly known as:  XYZAL ALLERGY 24HR Take 1 tablet (5 mg total) by mouth every evening.   levothyroxine 112 MCG tablet Commonly known as:  SYNTHROID, LEVOTHROID TAKE 1 TABLET BY MOUTH  DAILY   losartan 100 MG tablet Commonly known as:  COZAAR Take 1 tablet (100 mg total) by mouth daily.   multivitamin with minerals tablet Take 1 tablet by mouth daily.   naproxen sodium 220 MG tablet Commonly known as:  ALEVE Take 220 mg by mouth as needed. Reported on 03/04/2016   NOVOLIN R RELION 100 units/mL injection Generic drug:  insulin regular   potassium chloride SA 20 MEQ tablet Commonly known as:  K-DUR,KLOR-CON Take 1 tablet (20 mEq total) by mouth 2 (two) times daily.   PROBIOTIC DAILY PO Take by mouth.   rOPINIRole 0.25 MG tablet Commonly known as:  REQUIP TAKE 1 TABLET BY MOUTH AT BEDTIME   sertraline 50 MG  tablet Commonly known as:  ZOLOFT Take 1 tablet (50 mg total) by mouth daily.   simethicone 80 MG chewable tablet Commonly known as:  GAS-X Chew 1 tablet (80 mg total) by mouth 4 (four) times daily as needed for flatulence.   TRETIN-X 0.025 % CREAM Kit Generic drug:  Tretinoin-Cleanser-Moisturizer APPLY ONCE DAILY AS INSTRUCTED   tretinoin 0.025 % cream Commonly known as:  RETIN-A Apply topically at bedtime.   Vitamin D 2000 units Caps Take 1 capsule (2,000 Units total) by mouth daily.  Allergies  Allergen Reactions  . Penicillins Anaphylaxis    Has patient had a PCN reaction causing immediate rash, facial/tongue/throat swelling, SOB or lightheadedness with hypotension: No Has patient had a PCN reaction causing severe rash involving mucus membranes or skin necrosis: No Has patient had a PCN reaction that required hospitalization: Yes/ Has patient had a PCN reaction occurring within the last 10 years: No If all of the above answers are "NO", then may proceed with Cephalosporin use.   . Ace Inhibitors     INDUCED BRONCHOSPASM     Procedures/Studies:  NG tube  Dg Abd 1 View  Result Date: 02/10/2018 CLINICAL DATA:  Evaluate partial small bowel obstruction EXAM: ABDOMEN - 1 VIEW COMPARISON:  CT 02/09/2018 FINDINGS: Dilated small bowel loops again noted. Gas and contrast material noted in the right colon. Findings compatible with partial small bowel obstruction, not significantly changed. Prior cholecystectomy. No free air or organomegaly. IMPRESSION: Continued partial small bowel obstruction pattern, not significantly changed. Electronically Signed   By: Rolm Baptise M.D.   On: 02/10/2018 08:59   Ct Abdomen Pelvis W Contrast  Result Date: 02/09/2018 CLINICAL DATA:  Mid to lower abdominal pain with nausea and vomiting for 5 days. History of prior hysterectomy, appendectomy and cholecystectomy. EXAM: CT ABDOMEN AND PELVIS WITH CONTRAST TECHNIQUE: Multidetector CT imaging of  the abdomen and pelvis was performed using the standard protocol following bolus administration of intravenous contrast. CONTRAST:  114m ISOVUE-300 IOPAMIDOL (ISOVUE-300) INJECTION 61% COMPARISON:  CT scan 10/13/2009 FINDINGS: Lower chest: The lung bases are clear of acute process. No pleural effusion or pulmonary lesions. The heart is normal in size. No pericardial effusion. Aortic and coronary artery calcifications are noted. The distal esophagus and aorta are unremarkable. Hepatobiliary: No focal hepatic lesions. Status post cholecystectomy with mild intra and extrahepatic biliary dilatation, not significantly changed. Pancreas: No mass, inflammation or ductal dilatation. Spleen: Normal size.  No focal lesions. Adrenals/Urinary Tract: The adrenal glands and kidneys are unremarkable. No renal, ureteral or bladder calculi or mass. Stomach/Bowel: The stomach is unremarkable. The duodenum is normal. There are dilated small bowel loops with air-fluid levels into the mid pelvic area. Transition to normal/decompressed distal small bowel loops most likely due to adhesions. Caliber change noted in the right mid abdomen demonstrated best on the coronal series images 49, 50 and 51. No masses identified. The terminal ileum is normal. The colons unremarkable. No acute inflammatory changes or obstructive findings. Moderate stool. Vascular/Lymphatic: Stable scattered atherosclerotic calcifications involving the aorta and iliac arteries. No aneurysm or dissection. The branch vessels are patent. The major venous structures are patent. No mesenteric or retroperitoneal mass or adenopathy. Reproductive: Surgically absent. Other: No pelvic mass or free pelvic fluid collections. No inguinal mass or adenopathy. Musculoskeletal: No significant bony findings. Benign-appearing hemangioma noted in the L2 vertebral body. Advanced facet disease noted in the lower lumbar spine. IMPRESSION: 1. CT findings consistent with a early or partial  small bowel obstruction due to adhesions in the right lower quadrant. 2. No other significant abdominal/pelvic findings. 3. Status post cholecystectomy with mild associated biliary dilatation. Electronically Signed   By: PMarijo SanesM.D.   On: 02/09/2018 16:11   Dg Abd Portable 1v-small Bowel Obstruction Protocol-initial, 8 Hr Delay  Result Date: 02/11/2018 CLINICAL DATA:  Small-bowel obstruction protocol. 8 hour delayed image. EXAM: PORTABLE ABDOMEN - 1 VIEW COMPARISON:  Abdominal radiograph performed 02/10/2018 FINDINGS: Contrast has progressed to the colon. There is no definite evidence for bowel obstruction. Distended small bowel  loops may simply reflect dysmotility. The patient's enteric tube is noted ending overlying the fundus of the stomach, with the side port at the distal esophagus. Clips are noted within the right upper quadrant, reflecting prior cholecystectomy. No acute osseous abnormalities are seen. IMPRESSION: Contrast has progressed to the colon. No definite evidence for bowel obstruction. Distended small bowel loops may simply reflect dysmotility. Electronically Signed   By: Garald Balding M.D.   On: 02/11/2018 03:02   Dg Addison Bailey G Tube Plc W/fl W/rad  Result Date: 02/10/2018 CLINICAL DATA:  NG tube placement EXAM: NASO G TUBE PLACEMENT WITH FL AND WITH RAD FLUOROSCOPY TIME:  Fluoroscopy Time:  1.1 minutes Radiation Exposure Index (if provided by the fluoroscopic device): 21.8 mGy Number of Acquired Spot Images: 0 COMPARISON:  None. FINDINGS: NG tube was placed under fluoroscopic guidance. The tip is in the proximal to mid stomach and the side port in the proximal stomach. IMPRESSION: Successful NG tube placement under fluoroscopic guidance. Electronically Signed   By: Rolm Baptise M.D.   On: 02/10/2018 16:50      The results of significant diagnostics from this hospitalization (including imaging, microbiology, ancillary and laboratory) are listed below for reference.      Microbiology: No results found for this or any previous visit (from the past 240 hour(s)).   Labs: BNP (last 3 results) No results for input(s): BNP in the last 8760 hours. Basic Metabolic Panel: Recent Labs  Lab 02/09/18 1051 02/09/18 1756 02/10/18 0433 02/11/18 0404  NA 139 139 139 141  K 4.8 4.6 5.6* 4.4  CL 103 101 105 108  CO2 _0 GLUCOSE 104* 71 146* 127*  BUN _1 CREATININE 0.97 0.91 0.84 0.78  CALCIUM 9.5 9.8 8.7* 8.0*   Liver Function Tests: Recent Labs  Lab 02/09/18 1051 02/09/18 1756  AST 16 21  ALT 10 15  ALKPHOS  --  58  BILITOT 0.7 0.7  PROT 6.8 8.0  ALBUMIN  --  4.3   Recent Labs  Lab 02/09/18 1756  LIPASE 23   No results for input(s): AMMONIA in the last 168 hours. CBC: Recent Labs  Lab 02/09/18 1051 02/09/18 1756 02/10/18 0433  WBC 13.2* 12.0* 11.0*  NEUTROABS 10,085* 8.5*  --   HGB 14.1 14.9 13.8  HCT 41.4 44.9 42.8  MCV 81.7 86.0 87.9  PLT 267 242 198   Cardiac Enzymes: No results for input(s): CKTOTAL, CKMB, CKMBINDEX, TROPONINI in the last 168 hours. BNP: Invalid input(s): POCBNP CBG: Recent Labs  Lab 02/12/18 0717 02/12/18 1231 02/12/18 1748 02/12/18 2123 02/13/18 0729  GLUCAP 126* 156* 169* 182* 176*   D-Dimer No results for input(s): DDIMER in the last 72 hours. Hgb A1c No results for input(s): HGBA1C in the last 72 hours. Lipid Profile No results for input(s): CHOL, HDL, LDLCALC, TRIG, CHOLHDL, LDLDIRECT in the last 72 hours. Thyroid function studies No results for input(s): TSH, T4TOTAL, T3FREE, THYROIDAB in the last 72 hours.  Invalid input(s): FREET3 Anemia work up No results for input(s): VITAMINB12, FOLATE, FERRITIN, TIBC, IRON, RETICCTPCT in the last 72 hours. Urinalysis    Component Value Date/Time   COLORURINE STRAW (A) 02/09/2018 1907   APPEARANCEUR CLEAR 02/09/2018 1907   LABSPEC 1.019 02/09/2018 1907   PHURINE 7.0 02/09/2018 1907   GLUCOSEU 50 (A) 02/09/2018 1907   HGBUR  NEGATIVE 02/09/2018 [place] NEGATIVE 02/09/2018 [place] NEGATIVE 02/09/2018 [place] NEGATIVE 02/09/2018 1907  UROBILINOGEN 0.2 05/30/2008 2345   NITRITE NEGATIVE 02/09/2018 1907   LEUKOCYTESUR NEGATIVE 02/09/2018 1907   Sepsis Labs Invalid input(s): PROCALCITONIN,  WBC,  LACTICIDVEN Microbiology No results found for this or any previous visit (from the past 240 hour(s)).   Time coordinating discharge in minutes: 55  SIGNED:   Debbe Odea, MD  [facility] 02/13/2018, 10:14 AM Pager   If 7PM-7AM, please contact night-coverage [email or web address] Password [email or web address]

## 2018-03-18 ENCOUNTER — Other Ambulatory Visit: Payer: Self-pay | Admitting: Physician Assistant

## 2018-04-10 DIAGNOSIS — Z9641 Presence of insulin pump (external) (internal): Secondary | ICD-10-CM | POA: Diagnosis not present

## 2018-04-10 DIAGNOSIS — I10 Essential (primary) hypertension: Secondary | ICD-10-CM | POA: Diagnosis not present

## 2018-04-10 DIAGNOSIS — E7800 Pure hypercholesterolemia, unspecified: Secondary | ICD-10-CM | POA: Diagnosis not present

## 2018-04-10 DIAGNOSIS — E039 Hypothyroidism, unspecified: Secondary | ICD-10-CM | POA: Diagnosis not present

## 2018-04-10 DIAGNOSIS — E109 Type 1 diabetes mellitus without complications: Secondary | ICD-10-CM | POA: Diagnosis not present

## 2018-06-03 ENCOUNTER — Other Ambulatory Visit: Payer: Self-pay | Admitting: Physician Assistant

## 2018-06-19 DIAGNOSIS — R0602 Shortness of breath: Secondary | ICD-10-CM | POA: Diagnosis not present

## 2018-06-19 DIAGNOSIS — Z794 Long term (current) use of insulin: Secondary | ICD-10-CM | POA: Diagnosis not present

## 2018-06-19 DIAGNOSIS — I10 Essential (primary) hypertension: Secondary | ICD-10-CM | POA: Diagnosis not present

## 2018-06-19 DIAGNOSIS — E119 Type 2 diabetes mellitus without complications: Secondary | ICD-10-CM | POA: Diagnosis not present

## 2018-07-04 DIAGNOSIS — E7800 Pure hypercholesterolemia, unspecified: Secondary | ICD-10-CM | POA: Diagnosis not present

## 2018-07-04 DIAGNOSIS — E039 Hypothyroidism, unspecified: Secondary | ICD-10-CM | POA: Diagnosis not present

## 2018-07-04 DIAGNOSIS — E109 Type 1 diabetes mellitus without complications: Secondary | ICD-10-CM | POA: Diagnosis not present

## 2018-07-05 ENCOUNTER — Other Ambulatory Visit: Payer: Self-pay | Admitting: Physician Assistant

## 2018-07-05 DIAGNOSIS — F439 Reaction to severe stress, unspecified: Secondary | ICD-10-CM

## 2018-07-06 MED ORDER — ALPRAZOLAM 1 MG PO TABS: ORAL_TABLET | ORAL | 0 refills | Status: AC

## 2018-07-06 MED ORDER — LOSARTAN POTASSIUM 100 MG PO TABS: 100.0000 mg | ORAL_TABLET | Freq: Every day | ORAL | 0 refills | Status: DC

## 2018-07-06 MED ORDER — LEVOTHYROXINE SODIUM 112 MCG PO TABS: 112.0000 ug | ORAL_TABLET | Freq: Every day | ORAL | 0 refills | Status: DC

## 2018-07-06 NOTE — Telephone Encounter (Signed)
Last OV 02/09/2018 Last refill 11/28/2017 Ok to refill xanax?

## 2018-07-11 DIAGNOSIS — E049 Nontoxic goiter, unspecified: Secondary | ICD-10-CM | POA: Diagnosis not present

## 2018-07-11 DIAGNOSIS — Z23 Encounter for immunization: Secondary | ICD-10-CM | POA: Diagnosis not present

## 2018-07-11 DIAGNOSIS — E109 Type 1 diabetes mellitus without complications: Secondary | ICD-10-CM | POA: Diagnosis not present

## 2018-07-11 DIAGNOSIS — E7800 Pure hypercholesterolemia, unspecified: Secondary | ICD-10-CM | POA: Diagnosis not present

## 2018-07-11 DIAGNOSIS — I10 Essential (primary) hypertension: Secondary | ICD-10-CM | POA: Diagnosis not present

## 2018-07-11 DIAGNOSIS — Z9641 Presence of insulin pump (external) (internal): Secondary | ICD-10-CM | POA: Diagnosis not present

## 2018-07-11 DIAGNOSIS — E039 Hypothyroidism, unspecified: Secondary | ICD-10-CM | POA: Diagnosis not present

## 2018-07-12 ENCOUNTER — Ambulatory Visit: Payer: Medicare Other | Admitting: Family Medicine

## 2018-07-12 ENCOUNTER — Ambulatory Visit: Payer: Medicare Other | Admitting: Physician Assistant

## 2018-07-12 ENCOUNTER — Other Ambulatory Visit: Payer: Self-pay | Admitting: Endocrinology

## 2018-07-12 DIAGNOSIS — E049 Nontoxic goiter, unspecified: Secondary | ICD-10-CM

## 2018-07-15 ENCOUNTER — Other Ambulatory Visit: Payer: Self-pay | Admitting: Physician Assistant

## 2018-07-15 DIAGNOSIS — F439 Reaction to severe stress, unspecified: Secondary | ICD-10-CM

## 2018-07-17 ENCOUNTER — Ambulatory Visit
Admission: RE | Admit: 2018-07-17 | Discharge: 2018-07-17 | Disposition: A | Discharge status: Home / Self Care | Payer: Medicare Other | Source: Ambulatory Visit | Attending: Endocrinology | Admitting: Endocrinology

## 2018-07-17 DIAGNOSIS — E049 Nontoxic goiter, unspecified: Secondary | ICD-10-CM | POA: Diagnosis not present

## 2018-07-18 DIAGNOSIS — F418 Other specified anxiety disorders: Secondary | ICD-10-CM | POA: Diagnosis not present

## 2018-07-18 DIAGNOSIS — G2581 Restless legs syndrome: Secondary | ICD-10-CM | POA: Diagnosis not present

## 2018-07-18 DIAGNOSIS — M4690 Unspecified inflammatory spondylopathy, site unspecified: Secondary | ICD-10-CM | POA: Diagnosis not present

## 2018-07-18 DIAGNOSIS — L709 Acne, unspecified: Secondary | ICD-10-CM | POA: Diagnosis not present

## 2018-10-11 DIAGNOSIS — E109 Type 1 diabetes mellitus without complications: Secondary | ICD-10-CM | POA: Diagnosis not present

## 2018-10-11 DIAGNOSIS — E039 Hypothyroidism, unspecified: Secondary | ICD-10-CM | POA: Diagnosis not present

## 2018-10-11 DIAGNOSIS — I10 Essential (primary) hypertension: Secondary | ICD-10-CM | POA: Diagnosis not present

## 2018-10-11 DIAGNOSIS — E7800 Pure hypercholesterolemia, unspecified: Secondary | ICD-10-CM | POA: Diagnosis not present

## 2018-10-11 DIAGNOSIS — Z9641 Presence of insulin pump (external) (internal): Secondary | ICD-10-CM | POA: Diagnosis not present

## 2018-12-08 ENCOUNTER — Other Ambulatory Visit: Payer: Self-pay | Admitting: Cardiology

## 2019-01-10 DIAGNOSIS — E7800 Pure hypercholesterolemia, unspecified: Secondary | ICD-10-CM | POA: Diagnosis not present

## 2019-01-10 DIAGNOSIS — Z9641 Presence of insulin pump (external) (internal): Secondary | ICD-10-CM | POA: Diagnosis not present

## 2019-01-10 DIAGNOSIS — E049 Nontoxic goiter, unspecified: Secondary | ICD-10-CM | POA: Diagnosis not present

## 2019-01-10 DIAGNOSIS — I10 Essential (primary) hypertension: Secondary | ICD-10-CM | POA: Diagnosis not present

## 2019-01-10 DIAGNOSIS — E039 Hypothyroidism, unspecified: Secondary | ICD-10-CM | POA: Diagnosis not present

## 2019-01-10 DIAGNOSIS — E109 Type 1 diabetes mellitus without complications: Secondary | ICD-10-CM | POA: Diagnosis not present

## 2019-01-17 ENCOUNTER — Other Ambulatory Visit: Payer: Self-pay

## 2019-01-17 DIAGNOSIS — J301 Allergic rhinitis due to pollen: Secondary | ICD-10-CM

## 2019-01-17 MED ORDER — LEVOCETIRIZINE DIHYDROCHLORIDE 5 MG PO TABS: 5.0000 mg | ORAL_TABLET | Freq: Every evening | ORAL | 0 refills | Status: AC

## 2019-01-23 ENCOUNTER — Encounter: Payer: Self-pay | Admitting: Adult Health

## 2019-01-23 ENCOUNTER — Telehealth: Payer: Self-pay | Admitting: Neurology

## 2019-01-23 NOTE — Telephone Encounter (Signed)
Pt returned called and agreed to swtich appt to NP for the same day. Pt has consented to a Virtual Visit and for the insurance to be filled as such. Pt confirmed email that is in the chart.

## 2019-01-23 NOTE — Telephone Encounter (Signed)
Called the patient to discuss her upcoming apt for Monday. Wanted to offer the option for the patient visit to be video and or telephone. She is scheduled 5/18 at 11am. I wanted to see about her following up with NP as well if she is able to change dates. If agreeable to that please scheduled with Butch Penny, NP. IF not ok to keep apt

## 2019-01-24 ENCOUNTER — Encounter: Payer: Self-pay | Admitting: Neurology

## 2019-01-24 NOTE — Telephone Encounter (Signed)
email sent [email or web address].  Chart updated. Appt r/s to 01-25-19 at 8:30am.

## 2019-01-24 NOTE — Addendum Note (Signed)
Addended by: Guy Begin on: 01/24/2019 10:19 AM   Modules accepted: Orders

## 2019-01-25 ENCOUNTER — Other Ambulatory Visit: Payer: Self-pay

## 2019-01-25 ENCOUNTER — Ambulatory Visit (INDEPENDENT_AMBULATORY_CARE_PROVIDER_SITE_OTHER): Payer: Medicare Other | Admitting: Adult Health

## 2019-01-25 VITALS — BP 147/64 | HR 59 | Wt 168.4 lb

## 2019-01-25 DIAGNOSIS — G4733 Obstructive sleep apnea (adult) (pediatric): Secondary | ICD-10-CM

## 2019-01-25 DIAGNOSIS — Z9989 Dependence on other enabling machines and devices: Secondary | ICD-10-CM | POA: Diagnosis not present

## 2019-01-25 NOTE — Progress Notes (Addendum)
PATIENT: [name] DOB: Nov 17, 1950  REASON FOR VISIT: follow up HISTORY FROM: patient  Virtual Visit via Video Note  I connected with [name] on 01/25/19 at  8:30 AM EDT by a video enabled telemedicine application located remotely at [facility] and verified that I am speaking with the correct person using two identifiers who was located at their own home.   I discussed the limitations of evaluation and management by telemedicine and the availability of in person appointments. The patient expressed understanding and agreed to proceed.   PATIENT: [name] DOB: March 11, 1951  REASON FOR VISIT: follow up HISTORY FROM: patient  HISTORY OF PRESENT ILLNESS: Today 01/25/19   [name] is a 68 year old female with a history of obstructive sleep apnea on CPAP.  She returns today for a virtual visit.  Her download indicates that she use her machine 29 out of 30 days for compliance of 97%.  She use her machine greater than 4 hours each night.  On average she uses her machine 8 hours and 28 minutes.  Her residual AHI is 2.5 on 6 cm of water with EPR of 3.  She does not have a significant leak.  She reports that when she saw Dr. Vickey Huger a year ago her pressure was supposed to be increased to 7.  She denies any new issues with the machine.  She joins me today for a virtual visit.    REVIEW OF SYSTEMS: Out of a complete 14 system review of symptoms, the patient complains only of the following symptoms, and all other reviewed systems are negative.  See HPI  ALLERGIES: Allergies  Allergen Reactions  . Penicillins Anaphylaxis    Has patient had a PCN reaction causing immediate rash, facial/tongue/throat swelling, SOB or lightheadedness with hypotension: No Has patient had a PCN reaction causing severe rash involving mucus membranes or skin necrosis: No Has patient had a PCN reaction that required hospitalization: Yes/ Has patient had a PCN  reaction occurring within the last 10 years: No If all of the above answers are "NO", then may proceed with Cephalosporin use.   Donivan Scull Inhibitors     INDUCED BRONCHOSPASM    HOME MEDICATIONS: Outpatient Medications Prior to Visit  Medication Sig Dispense Refill  . albuterol (PROVENTIL HFA;VENTOLIN HFA) 108 (90 Base) MCG/ACT inhaler Inhale 2 puffs into the lungs every 6 (six) hours as needed. 8.5 g 11  . albuterol (PROVENTIL) (2.5 MG/3ML) 0.083% nebulizer solution Take 3 mLs (2.5 mg total) by nebulization every 6 (six) hours as needed. ICD10:J45.909 75 mL 1  . ALPRAZolam (XANAX) 1 MG tablet TAKE ONE-HALF TABLET BY MOUTH ONCE DAILY AS NEEDED FOR STRESS 90 tablet 0  . amLODipine (NORVASC) 10 MG tablet One a day 30 tablet 11  . aspirin 81 MG tablet Take 81 mg by mouth daily.    Marland Kitchen atenolol (TENORMIN) 50 MG tablet Take 1 tablet by mouth twice daily 180 tablet 1  . atorvastatin (LIPITOR) 40 MG tablet Take 1 tablet (40 mg total) daily by mouth. 90 tablet 3  . augmented betamethasone dipropionate (DIPROLENE-AF) 0.05 % ointment Apply topically 2 (two) times daily. 30 g 5  . BIOTIN PO Take 10,000 mcg by mouth daily.     . carboxymethylcellulose (REFRESH PLUS) 0.5 % SOLN Place 1 drop into both eyes 2 (two) times daily as needed (dry eyes).    . Cholecalciferol (VITAMIN D) 2000 units CAPS Take 1 capsule (2,000 Units total) by mouth  daily. 30 capsule 3  . Cyanocobalamin (VITAMIN B-12) 5000 MCG SUBL Place 5,000 mcg under the tongue daily.    Marland Kitchen Dextrose, Diabetic Use, (GLUCOSE PO) Take by mouth as needed.     Marland Kitchen levocetirizine (XYZAL ALLERGY 24HR) 5 MG tablet Take 1 tablet (5 mg total) by mouth every evening. 30 tablet 0  . levothyroxine (SYNTHROID, LEVOTHROID) 112 MCG tablet Take 1 tablet (112 mcg total) by mouth daily. 90 tablet 0  . losartan (COZAAR) 100 MG tablet Take 1 tablet (100 mg total) by mouth daily. 90 tablet 0  . Lysine 500 MG TABS Take 500 mg by mouth 2 (two) times a day.    . Magnesium 400  MG TABS Take 400 mg by mouth daily.    . Multiple Vitamins-Minerals (MULTIVITAMIN WITH MINERALS) tablet Take 1 tablet by mouth daily.    . naproxen sodium (ANAPROX) 220 MG tablet Take 220 mg by mouth as needed. Reported on 03/04/2016    . NOVOLIN R RELION 100 UNIT/ML injection     . potassium chloride SA (K-DUR,KLOR-CON) 20 MEQ tablet Take 1 tablet (20 mEq total) by mouth 2 (two) times daily. 180 tablet 3  . Probiotic Product (PROBIOTIC DAILY PO) Take by mouth.    Marland Kitchen rOPINIRole (REQUIP) 0.25 MG tablet TAKE 1 TABLET BY MOUTH AT BEDTIME 90 tablet 1  . sertraline (ZOLOFT) 50 MG tablet Take 1 tablet (50 mg total) by mouth daily. 90 tablet 3  . simethicone (GAS-X) 80 MG chewable tablet Chew 1 tablet (80 mg total) by mouth 4 (four) times daily as needed for flatulence. 100 tablet 2  . traMADol (ULTRAM) 50 MG tablet Take 50 mg by mouth every 12 (twelve) hours as needed.     No facility-administered medications prior to visit.     PAST MEDICAL HISTORY: Past Medical History:  Diagnosis Date  . Anxiety   . Asthma   . Depression   . Depression   . Diabetes mellitus   . Fibromyalgia   . GERD (gastroesophageal reflux disease)   . Goiter   . Hair loss   . Hypertension   . Menopause   . Other and unspecified hyperlipidemia   . Positive PPD   . Small bowel obstruction (HCC) 02/09/2018  . SVT (supraventricular tachycardia) (HCC)   . Vasculitis (HCC)     PAST SURGICAL HISTORY: Past Surgical History:  Procedure Laterality Date  . APPENDECTOMY    . CHOLECYSTECTOMY    . PARTIAL HYSTERECTOMY      FAMILY HISTORY: Family History  Problem Relation Age of Onset  . Hepatitis Mother 41       died from Hep A  . Diabetes Father   . Cancer Brother   . Crohn's disease Brother   . Alcohol abuse Brother     SOCIAL HISTORY: Social History   Socioeconomic History  . Marital status: Married    Spouse name: Not on file  . Number of children: Not on file  . Years of education: Not on file  .  Highest education level: Not on file  Occupational History  . Not on file  Social Needs  . Financial resource strain: Not on file  . Food insecurity:    Worry: Not on file    Inability: Not on file  . Transportation needs:    Medical: Not on file    Non-medical: Not on file  Tobacco Use  . Smoking status: Former Smoker    Years: 15.00    Types: Cigarettes  Last attempt to quit: 03/13/1988    Years since quitting: 30.8  . Smokeless tobacco: Never Used  Substance and Sexual Activity  . Alcohol use: No  . Drug use: No  . Sexual activity: Not on file  Lifestyle  . Physical activity:    Days per week: Not on file    Minutes per session: Not on file  . Stress: Not on file  Relationships  . Social connections:    Talks on phone: Not on file    Gets together: Not on file    Attends religious service: Not on file    Active member of club or organization: Not on file    Attends meetings of clubs or organizations: Not on file    Relationship status: Not on file  . Intimate partner violence:    Fear of current or ex partner: Not on file    Emotionally abused: Not on file    Physically abused: Not on file    Forced sexual activity: Not on file  Other Topics Concern  . Not on file  Social History Narrative   Caffeine 1-2 cups coffee daily.   Lives at home with husband and daughter.   Retired.   Associates degree.   Has 4 kids.      PHYSICAL EXAM  Vitals:   01/25/19 0936  Weight: 168 lb 6.4 oz (76.4 kg)   Vitals:   01/25/19 0936  BP: (!) 147/64  Pulse: (!) 59  SpO2: 97%       Generalized: Well developed, in no acute distress   Neurological examination  Mentation: Alert oriented to time, place, history taking. Follows all commands speech and language fluent Cranial nerve II-XII:  Extraocular movements were full. Facial symmetry noted uvula tongue midline. Head turning and shoulder shrug  were normal and symmetric. Motor: Good motor strength throughout  subjectively per patient Sensory: Sensory testing is intact to soft touch on all 4 extremities subjectively per patient Coordination: Cerebellar testing reveals good finger-nose-finger Gait and station: Gait is normal.  Reflexes: UTA  DIAGNOSTIC DATA (LABS, IMAGING, TESTING) - I reviewed patient records, labs, notes, testing and imaging myself where available.  Lab Results  Component Value Date   WBC 11.0 (H) 02/10/2018   HGB 13.8 02/10/2018   HCT 42.8 02/10/2018   MCV 87.9 02/10/2018   PLT 198 02/10/2018      Component Value Date/Time   NA 141 02/11/2018 0404   K 4.4 02/11/2018 0404   CL 108 02/11/2018 0404   CO2 25 02/11/2018 0404   GLUCOSE 127 (H) 02/11/2018 0404   BUN 13 02/11/2018 0404   CREATININE 0.78 02/11/2018 0404   CREATININE 0.97 02/09/2018 1051   CALCIUM 8.0 (L) 02/11/2018 0404   PROT 8.0 02/09/2018 1756   ALBUMIN 4.3 02/09/2018 1756   AST 21 02/09/2018 1756   ALT 15 02/09/2018 1756   ALKPHOS 58 02/09/2018 1756   BILITOT 0.7 02/09/2018 1756   GFRNONAA >60 02/11/2018 0404   GFRNONAA 60 02/09/2018 1051   GFRAA >60 02/11/2018 0404   GFRAA 70 02/09/2018 1051   Lab Results  Component Value Date   CHOL 147 01/09/2018   HDL 40 (L) 01/09/2018   LDLCALC 74 01/09/2018   TRIG 252 (H) 01/09/2018   CHOLHDL 3.7 01/09/2018   Lab Results  Component Value Date   HGBA1C 6.7 12/20/2014   Lab Results  Component Value Date   VITAMINB12 626 10/21/2006   Lab Results  Component Value Date   TSH  1.82 01/09/2018      ASSESSMENT AND PLAN 68 y.o. year old female  has a past medical history of Anxiety, Asthma, Depression, Depression, Diabetes mellitus, Fibromyalgia, GERD (gastroesophageal reflux disease), Goiter, Hair loss, Hypertension, Menopause, Other and unspecified hyperlipidemia, Positive PPD, Small bowel obstruction (HCC) (02/09/2018), SVT (supraventricular tachycardia) (HCC), and Vasculitis (HCC). here with:  1.  Obstructive sleep apnea on CPAP  The patient  CPAP download shows excellent compliance and good treatment of her apnea.  I will increase her pressure to 7 cm of water.  She is advised that if her symptoms worsen or she develops new symptoms she should let us know.  She will follow-up in 1 year or sooner as needed.   I spent 15 minutes with the patient this time was spent reviewing chart, reviewing CPAP download and discussing plan of care with the patient.   Butch PennyMegan Caylin Nass, MSN, NP-C 01/25/2019, 8:58 AM [facility] [address], [address] [place], [place] [id number] [phone]

## 2019-01-25 NOTE — Progress Notes (Signed)
Received that [facility] got the orders.

## 2019-01-29 ENCOUNTER — Ambulatory Visit: Payer: Medicare Other | Admitting: Neurology

## 2019-01-29 ENCOUNTER — Ambulatory Visit: Payer: Medicare Other | Admitting: Adult Health

## 2019-03-06 ENCOUNTER — Other Ambulatory Visit: Payer: Self-pay | Admitting: Family Medicine

## 2019-03-06 DIAGNOSIS — J301 Allergic rhinitis due to pollen: Secondary | ICD-10-CM

## 2019-04-13 DIAGNOSIS — E109 Type 1 diabetes mellitus without complications: Secondary | ICD-10-CM | POA: Diagnosis not present

## 2019-04-13 DIAGNOSIS — Z9641 Presence of insulin pump (external) (internal): Secondary | ICD-10-CM | POA: Diagnosis not present

## 2019-04-13 DIAGNOSIS — E7800 Pure hypercholesterolemia, unspecified: Secondary | ICD-10-CM | POA: Diagnosis not present

## 2019-04-13 DIAGNOSIS — I10 Essential (primary) hypertension: Secondary | ICD-10-CM | POA: Diagnosis not present

## 2019-04-13 DIAGNOSIS — E039 Hypothyroidism, unspecified: Secondary | ICD-10-CM | POA: Diagnosis not present

## 2019-04-13 DIAGNOSIS — E049 Nontoxic goiter, unspecified: Secondary | ICD-10-CM | POA: Diagnosis not present

## 2019-06-23 NOTE — Progress Notes (Signed)
Primary Physician/Referring:  Irena Reichmann, DO  Patient ID: [name], female    DOB: Apr 12, 1951, 68 y.o.   MRN: [id number]  Chief Complaint  Patient presents with  . SVT  . Follow-up    1 years   HPI:    [name]  is a 68 y.o. Caucasian female with  hypertension, controlled IDDM, mild obesity, hyperlipidemia, fibromyalgia and PSVT. Echocardiogram on 09/04/2014 at revealed moderate LVH, EF 59%, bicuspid aortic valve with trace aortic stenosis. No pulmonary hypertension. Nuclear stress test on 01/01/2014 at revealed no evidence of ischemia.  She is here for annual follow up on hypertension and aortic stenosis. She denies any headache, visual disturbances, or symptoms of claudication. No syncope.No chest pain, PND, orthopnea, or edema. She is doing well and remains asymptomatic except for mild chronic dyspnea on exertion that is unchanged. She continues to have approximatley 1 episode of PSVT a month that she is able to terminate with vagal maneuvers. No new complaints today. Tolerating medications well. Scheduled to see her PCP next month.   Past Medical History:  Diagnosis Date  . Anxiety   . Asthma   . Depression   . Depression   . Diabetes mellitus   . Fibromyalgia   . GERD (gastroesophageal reflux disease)   . Goiter   . Hair loss   . Hypertension   . Menopause   . Other and unspecified hyperlipidemia   . Positive PPD   . Small bowel obstruction (HCC) 02/09/2018  . SVT (supraventricular tachycardia) (HCC)   . Vasculitis [facility])    Past Surgical History:  Procedure Laterality Date  . APPENDECTOMY    . CHOLECYSTECTOMY    . PARTIAL HYSTERECTOMY     Social History   Socioeconomic History  . Marital status: Married    Spouse name: Not on file  . Number of children: Not on file  . Years of education: Not on file  . Highest education level: Not on file  Occupational History  . Not on file  Social Needs  . Financial resource strain: Not on file  .  Food insecurity    Worry: Not on file    Inability: Not on file  . Transportation needs    Medical: Not on file    Non-medical: Not on file  Tobacco Use  . Smoking status: Former Smoker    Years: 15.00    Types: Cigarettes    Quit date: 03/13/1988    Years since quitting: 31.3  . Smokeless tobacco: Never Used  Substance and Sexual Activity  . Alcohol use: No  . Drug use: No  . Sexual activity: Not on file  Lifestyle  . Physical activity    Days per week: Not on file    Minutes per session: Not on file  . Stress: Not on file  Relationships  . Social Musician on phone: Not on file    Gets together: Not on file    Attends religious service: Not on file    Active member of club or organization: Not on file    Attends meetings of clubs or organizations: Not on file    Relationship status: Not on file  . Intimate partner violence    Fear of current or ex partner: Not on file    Emotionally abused: Not on file    Physically abused: Not on file    Forced sexual activity: Not on file  Other Topics Concern  . Not on  file  Social History Narrative   Caffeine 1-2 cups coffee daily.   Lives at home with husband and daughter.   Retired.   Associates degree.   Has 4 kids.   ROS  Review of Systems  Constitution: Negative for decreased appetite, malaise/fatigue, weight gain and weight loss.  Eyes: Negative for visual disturbance.  Cardiovascular: Positive for dyspnea on exertion and palpitations (approximately once a month). Negative for chest pain, claudication, leg swelling, orthopnea and syncope.  Respiratory: Positive for snoring (on CPAP). Negative for hemoptysis and wheezing.   Endocrine: Negative for cold intolerance and heat intolerance.  Hematologic/Lymphatic: Does not bruise/bleed easily.  Skin: Negative for nail changes.  Musculoskeletal: Positive for joint pain and muscle cramps (fibromyalgia). Negative for muscle weakness and myalgias.  Gastrointestinal:  Negative for abdominal pain, change in bowel habit, nausea and vomiting.  Neurological: Negative for difficulty with concentration, dizziness, focal weakness and headaches.  Psychiatric/Behavioral: Negative for altered mental status and suicidal ideas.  All other systems reviewed and are negative.  Objective   Vitals with BMI 06/25/2019 01/25/2019 02/13/2018  Height 5\' 3"  - -  Weight 172 lbs 168 lbs 6 oz -  BMI 30.48 - -  Systolic 129 147 -  Diastolic 54 64 -  Pulse 56 59 59    Blood pressure (!) 129/54, pulse (!) 56, temperature 98 F (36.7 C), height 5\' 3"  (1.6 m), weight 172 lb (78 kg), SpO2 95 %. Body mass index is 30.47 kg/m.   Physical Exam  Constitutional:  Moderately built and mildly obese in no acute distress.  Most obesity is truncal obesity.  HENT:  Head: Atraumatic.  Eyes: Conjunctivae are normal.  Neck: Neck supple. No JVD present. No thyromegaly present.  Cardiovascular: Normal rate, regular rhythm and intact distal pulses. Exam reveals no gallop.  Murmur heard.  Midsystolic murmur is present with a grade of 2/6 at the upper right sternal border and apex. No leg edema, no JVD.  Pulmonary/Chest: Effort normal and breath sounds normal.  Abdominal: Soft. Bowel sounds are normal.  Reducible ventral hernia noted.  Obese abdomen.  Musculoskeletal: Normal range of motion.  Neurological: She is alert.  Skin: Skin is warm and dry.  Psychiatric: She has a normal mood and affect.   Radiology: No results found.  Laboratory examination:   No results for input(s): NA, K, CL, CO2, GLUCOSE, BUN, CREATININE, CALCIUM, GFRNONAA, GFRAA in the last 8760 hours. CMP Latest Ref Rng & Units 02/11/2018 02/10/2018 02/09/2018  Glucose 65 - 99 mg/dL 161(W127(H) 960(A146(H) 71  BUN 6 - 20 mg/dL 13 14 17   Creatinine 0.44 - 1.00 mg/dL 5.400.78 9.810.84 1.910.91  Sodium 135 - 145 mmol/L 141 139 139  Potassium 3.5 - 5.1 mmol/L 4.4 5.6(H) 4.6  Chloride 101 - 111 mmol/L 108 105 101  CO2 22 - 32 mmol/L 25 23 29    Calcium 8.9 - 10.3 mg/dL 8.0(L) 8.7(L) 9.8  Total Protein 6.5 - 8.1 g/dL - - 8.0  Total Bilirubin 0.3 - 1.2 mg/dL - - 0.7  Alkaline Phos 38 - 126 U/L - - 58  AST 15 - 41 U/L - - 21  ALT 14 - 54 U/L - - 15   CBC Latest Ref Rng & Units 02/10/2018 02/09/2018 02/09/2018  WBC 4.0 - 10.5 K/uL 11.0(H) 12.0(H) 13.2(H)  Hemoglobin 12.0 - 15.0 g/dL 47.813.8 29.514.9 62.114.1  Hematocrit 36.0 - 46.0 % 42.8 44.9 41.4  Platelets 150 - 400 K/uL 198 242 267   Lipid Panel  Component Value Date/Time   CHOL 147 01/09/2018 0823   TRIG 252 (H) 01/09/2018 0823   HDL 40 (L) 01/09/2018 0823   CHOLHDL 3.7 01/09/2018 0823   VLDL 28 08/28/2015 1050   LDLCALC 74 01/09/2018 0823   HEMOGLOBIN A1C Lab Results  Component Value Date   HGBA1C 6.7 12/20/2014   TSH No results for input(s): TSH in the last 8760 hours. Medications and allergies   Allergies  Allergen Reactions  . Penicillins Anaphylaxis    Has patient had a PCN reaction causing immediate rash, facial/tongue/throat swelling, SOB or lightheadedness with hypotension: No Has patient had a PCN reaction causing severe rash involving mucus membranes or skin necrosis: No Has patient had a PCN reaction that required hospitalization: Yes/ Has patient had a PCN reaction occurring within the last 10 years: No If all of the above answers are "NO", then may proceed with Cephalosporin use.   . Ace Inhibitors     INDUCED BRONCHOSPASM     Prior to Admission medications   Medication Sig Start Date End Date Taking? Authorizing Provider  albuterol (PROVENTIL HFA;VENTOLIN HFA) 108 (90 Base) MCG/ACT inhaler Inhale 2 puffs into the lungs every 6 (six) hours as needed. 01/09/18   Dena Billet B, PA-C  albuterol (PROVENTIL) (2.5 MG/3ML) 0.083% nebulizer solution Take 3 mLs (2.5 mg total) by nebulization every 6 (six) hours as needed. QBH41:P37.902 01/10/18   Orlena Sheldon, PA-C  ALPRAZolam Duanne Moron) 1 MG tablet TAKE ONE-HALF TABLET BY MOUTH ONCE DAILY AS NEEDED FOR STRESS  07/06/18   Susy Frizzle, MD  amLODipine (NORVASC) 10 MG tablet One a day 03/04/16   Darlyne Russian, MD  aspirin 81 MG tablet Take 81 mg by mouth daily.    [provider]  atenolol (TENORMIN) 50 MG tablet Take 1 tablet by mouth twice daily 12/11/18   Patwardhan, Manish J, MD  atorvastatin (LIPITOR) 40 MG tablet Take 1 tablet (40 mg total) daily by mouth. 08/01/17   Orlena Sheldon, PA-C  augmented betamethasone dipropionate (DIPROLENE-AF) 0.05 % ointment Apply topically 2 (two) times daily. 03/25/15   Darlyne Russian, MD  BIOTIN PO Take 10,000 mcg by mouth daily.     [provider]  carboxymethylcellulose (REFRESH PLUS) 0.5 % SOLN Place 1 drop into both eyes 2 (two) times daily as needed (dry eyes).    [provider]  Cholecalciferol (VITAMIN D) 2000 units CAPS Take 1 capsule (2,000 Units total) by mouth daily. 11/28/17   Orlena Sheldon, PA-C  Cyanocobalamin (VITAMIN B-12) 5000 MCG SUBL Place 5,000 mcg under the tongue daily.    [provider]  Dextrose, Diabetic Use, (GLUCOSE PO) Take by mouth as needed.     [provider]  levocetirizine (XYZAL ALLERGY 24HR) 5 MG tablet Take 1 tablet (5 mg total) by mouth every evening. 01/17/19   Delsa Grana, PA-C  levothyroxine (SYNTHROID, LEVOTHROID) 112 MCG tablet Take 1 tablet (112 mcg total) by mouth daily. 07/06/18   Susy Frizzle, MD  losartan (COZAAR) 100 MG tablet Take 1 tablet (100 mg total) by mouth daily. 07/06/18   Susy Frizzle, MD  Lysine 500 MG TABS Take 500 mg by mouth 2 (two) times a day.    [provider]  Magnesium 400 MG TABS Take 400 mg by mouth daily.    [provider]  Multiple Vitamins-Minerals (MULTIVITAMIN WITH MINERALS) tablet Take 1 tablet by mouth daily.    [provider]  naproxen sodium (ANAPROX) 220  MG tablet Take 220 mg by mouth as needed. Reported on 03/04/2016    [provider]  NOVOLIN R RELION 100 UNIT/ML injection  01/12/17   [provider]  potassium chloride SA (K-DUR,KLOR-CON) 20 MEQ tablet Take 1 tablet (20 mEq total) by mouth 2 (two) times daily. 11/28/17   Dorena Bodo, PA-C  Probiotic Product (PROBIOTIC DAILY PO) Take by mouth.    [provider]  rOPINIRole (REQUIP) 0.25 MG tablet TAKE 1 TABLET BY MOUTH AT BEDTIME 07/17/18   Danelle Berry, PA-C  sertraline (ZOLOFT) 50 MG tablet Take 1 tablet (50 mg total) by mouth daily. 11/28/17   Dorena Bodo, PA-C  simethicone (GAS-X) 80 MG chewable tablet Chew 1 tablet (80 mg total) by mouth 4 (four) times daily as needed for flatulence. 02/13/18 02/13/19  Calvert Cantor, MD  traMADol (ULTRAM) 50 MG tablet Take 50 mg by mouth every 12 (twelve) hours as needed.    [provider]     Current Outpatient Medications  Medication Instructions  . albuterol (PROVENTIL HFA;VENTOLIN HFA) 108 (90 Base) MCG/ACT inhaler 2 puffs, Inhalation, Every 6 hours PRN  . albuterol (PROVENTIL) 2.5 mg, Nebulization, Every 6 hours PRN, ICD10:J45.909  . ALPRAZolam (XANAX) 1 MG tablet TAKE ONE-HALF TABLET BY MOUTH ONCE DAILY AS NEEDED FOR STRESS  . amLODipine (NORVASC) 10 MG tablet One a day  . aspirin 81 mg, Oral, Daily  . atenolol (TENORMIN) 50 MG tablet Take 1 tablet by mouth twice daily  . atorvastatin (LIPITOR) 40 mg, Oral, Daily  . augmented betamethasone dipropionate (DIPROLENE-AF) 0.05 % ointment Topical, 2 times daily  . BIOTIN PO 10,000 mcg, Oral, Daily  . Dextrose, Diabetic Use, (GLUCOSE PO) Oral, As needed  . levocetirizine (XYZAL ALLERGY 24HR) 5 mg, Oral, Every evening  . levothyroxine (SYNTHROID) 112 mcg, Oral, Daily  . losartan (COZAAR) 100 mg, Oral, Daily  . Lysine 500 mg, Oral, 2 times daily  . Magnesium 400 mg, Oral, Daily  . Melatonin 10 MG TABS 1 tablet, Oral, Daily  . Multiple Vitamins-Minerals (MULTIVITAMIN WITH MINERALS) tablet 1 tablet, Oral, Daily  . naproxen sodium (ALEVE) 220 mg, Oral, As needed, Reported on 03/04/2016  . NON FORMULARY 1 tablet, Oral,  Daily, neuriva   . NOVOLIN R RELION 100 UNIT/ML injection No dose, route, or frequency recorded.  . Omega 3-6-9 Fatty Acids (OMEGA 3-6-9 COMPLEX PO) 1 tablet, Oral, Daily  . polyethylene glycol powder (MIRALAX) 17 g, Oral, Daily  . potassium chloride (KLOR-CON) 20 MEQ packet 20 mEq, Oral, 2 times daily  . potassium chloride SA (K-DUR,KLOR-CON) 20 MEQ tablet 20 mEq, Oral, 2 times daily  . Probiotic Product (PROBIOTIC DAILY PO) Oral  . rOPINIRole (REQUIP) 0.25 MG tablet TAKE 1 TABLET BY MOUTH AT BEDTIME  . sertraline (ZOLOFT) 50 mg, Oral, Daily  . simethicone (GAS-X) 80 mg, Oral, 4 times daily PRN  . traMADol (ULTRAM) 50 mg, Oral, Every 12 hours PRN  . Turmeric 500 MG CAPS 1 tablet, Oral, Daily  . Vitamin B-12 5,000 mcg, Sublingual, Daily  . Vitamin D 2,000 Units, Oral, Daily    Cardiac Studies:   Exercise myoview stress 09/02/2014: 1. The resting electrocardiogram demonstrated normal sinus rhythm, normal resting conduction, no resting arrhythmias and nonspecific ST-T changes. The stress electrocardiogram was normal. The patient performed treadmill exercise using a Bruce protocol, completing 6:45 minutes. The patient completed an estimated workload of 10.2 METS. The blood pressure response to exercise was normal. The stress test was terminated because of  fatigue. 2. The overall quality of the study is good. There is no evidence of abnormal lung activity. Stress and rest SPECT images demonstrate homogeneous tracer distribution throughout the myocardium. Gated SPECT imaging reveals normal myocardial thickening and wall motion. The left ventricular ejection fraction was normal (44%) but visually appears to be normal. This is a low risk study. No significant change from Exercise sestamibi stress test on 05/08/2009.  Sleep study 10/17/2014 (Dohmeier, MD): Mild to moderate obstructive sleep apnea. Effectively treated with CPAP. Compliant   Echocardiogram 09/04/2014: Left ventricle cavity is normal in  size. Moderate concentric hypertrophy of the left ventricle. Normal global wall motion. Normal diastolic filling pattern. Calculated EF 59%. Bicuspid aortic valve with fused right and left coronary cusps. Mild regurgitation. Mild calcification of the aortic valve annulus. Mild aortic valve leaflet calcification. Mildly restricted aortic valve leaflets. Trace aortic valve stenosis. Mild mitral regurgitation. Mild tricuspid regurgitation. No evidence of pulmonary hypertension. The aortic root is normal. Mild atherosclerotic changes in the aorta. No significant change from prior echocardiogram 11/06/12.   Assessment     ICD-10-CM   1. SVT (supraventricular tachycardia) (HCC)  I47.1 PCV ECHOCARDIOGRAM COMPLETE    EKG 12-Lead  2. Bicuspid aortic valve  Q23.1 PCV ECHOCARDIOGRAM COMPLETE  3. Primary hypertension  I10 PCV ECHOCARDIOGRAM COMPLETE  4. Bilateral carotid bruits  R09.89      EKG 06/25/2019: Sinus bradycardia at 52 bpm, normal axis, IRBBB, no evidence of ischemia. IRBBB is new compared to previous EKG 06/19/2018.  Recommendations:   Patient presents for 1 year follow-up for paroxysmal SVT. She continues to have approximately 1 episode month that she is able to turn the patient vasovagal maneuvers. Although no documented PSVT, symptoms are very typical.   We'll continue conservative therapy for now unless symptoms become frequent or sustained.  Valsalva strain: I have explained to the patient the mechanism of PSVT, explained Valsalva maneuver, straining, splashing cold water on the face.    Patient also advised to go to the nearest medical facility to obtain a EKG to confirm presence of PSVT or activate EMS.  Also discussed if the usual maneuvers do not improve the symptoms to go to the emergency department.   Does have trace aortic stenosis and bicuspid aortic valve. No changes were noted to physical exam. Will repeat echocardiogram for surveillence of aortic stenosis and bicuspid  aortic valve. No clinical evidence of heart failure. Blood pressure has remained stable. No changes were made in medications today. I'll schedule for telephone appt to discuss her echo results, and unless any significant abnormalities, will continue with annual follow up.  Toniann Fail, MSN, APRN, FNP-C [facility] Office: [phone] Fax: [phone]   CC: Sabino Gasser, MD

## 2019-06-25 ENCOUNTER — Encounter: Payer: Self-pay | Admitting: Cardiology

## 2019-06-25 ENCOUNTER — Other Ambulatory Visit: Payer: Self-pay

## 2019-06-25 ENCOUNTER — Ambulatory Visit (INDEPENDENT_AMBULATORY_CARE_PROVIDER_SITE_OTHER): Payer: Medicare Other | Admitting: Cardiology

## 2019-06-25 VITALS — BP 129/54 | HR 56 | Temp 98.0°F | Ht 63.0 in | Wt 172.0 lb

## 2019-06-25 DIAGNOSIS — I10 Essential (primary) hypertension: Secondary | ICD-10-CM

## 2019-06-25 DIAGNOSIS — I471 Supraventricular tachycardia: Secondary | ICD-10-CM

## 2019-06-25 DIAGNOSIS — R0989 Other specified symptoms and signs involving the circulatory and respiratory systems: Secondary | ICD-10-CM | POA: Diagnosis not present

## 2019-06-25 DIAGNOSIS — Q231 Congenital insufficiency of aortic valve: Secondary | ICD-10-CM | POA: Diagnosis not present

## 2019-07-02 ENCOUNTER — Other Ambulatory Visit: Payer: Self-pay

## 2019-07-02 ENCOUNTER — Ambulatory Visit (INDEPENDENT_AMBULATORY_CARE_PROVIDER_SITE_OTHER): Payer: Medicare Other

## 2019-07-02 DIAGNOSIS — I471 Supraventricular tachycardia: Secondary | ICD-10-CM

## 2019-07-02 DIAGNOSIS — I10 Essential (primary) hypertension: Secondary | ICD-10-CM | POA: Diagnosis not present

## 2019-07-02 DIAGNOSIS — Q231 Congenital insufficiency of aortic valve: Secondary | ICD-10-CM | POA: Diagnosis not present

## 2019-07-09 ENCOUNTER — Other Ambulatory Visit: Payer: Self-pay

## 2019-07-09 ENCOUNTER — Encounter: Payer: Self-pay | Admitting: Cardiology

## 2019-07-09 ENCOUNTER — Telehealth (INDEPENDENT_AMBULATORY_CARE_PROVIDER_SITE_OTHER): Payer: Medicare Other | Admitting: Cardiology

## 2019-07-09 VITALS — BP 110/51 | HR 69 | Ht 63.0 in | Wt 169.0 lb

## 2019-07-09 DIAGNOSIS — Q231 Congenital insufficiency of aortic valve: Secondary | ICD-10-CM

## 2019-07-09 DIAGNOSIS — I350 Nonrheumatic aortic (valve) stenosis: Secondary | ICD-10-CM

## 2019-07-09 DIAGNOSIS — I10 Essential (primary) hypertension: Secondary | ICD-10-CM

## 2019-07-09 NOTE — Progress Notes (Addendum)
Virtual Visit via Telephone Note: Patient unable to use video assisted device.  This visit type was conducted due to national recommendations for restrictions regarding the COVID-19 Pandemic (e.g. social distancing).  This format is felt to be most appropriate for this patient at this time.  All issues noted in this document were discussed and addressed.  No physical exam was performed.  The patient has consented to conduct a Telehealth visit and understands insurance will be billed.   I connected with@, on 07/09/19 at  by TELEPHONE and verified that I am speaking with the correct person using two identifiers.   I discussed the limitations of evaluation and management by telemedicine and the availability of in person appointments. The patient expressed understanding and agreed to proceed.   I have discussed with patient regarding the safety during COVID Pandemic and steps and precautions to be taken including social distancing, frequent hand wash and use of detergent soap, gels with the patient. I asked the patient to avoid touching mouth, nose, eyes, ears with the hands. I encouraged regular walking around the neighborhood and exercise and regular diet, as long as social distancing can be maintained.  Primary Physician/Referring:  Irena Reichmann, DO  Patient ID: Brand Males, female    DOB: 1950-12-26, 68 y.o.   MRN: [id number]  Chief Complaint  Patient presents with   Hypertension   Follow-up   HPI:    DEMYAH SMYRE  is a 68 y.o. Caucasian female with  hypertension, controlled IDDM, mild obesity, hyperlipidemia, fibromyalgia and PSVT. Echocardiogram on 09/04/2014 at revealed moderate LVH, EF 59%, bicuspid aortic valve with trace aortic stenosis. No pulmonary hypertension. Nuclear stress test on 01/01/2014 at revealed no evidence of ischemia.  Patient was recently seen for annual follow-up, had reported one episode at SVT that she was easily able to terminate with vagal  maneuvers.  She also c/o chest pain, describes this as mostly present during routine activities, she has been moderately active and has never noticed any chest pain with physical activity.  She also has bronchial asthma.  She underwent echocardiogram for continued surveillance of bicuspid aortic valve and trace aortic stenosis.  This is a telephone appointment to discuss the results.  No syncope, PND, orthopnea, or edema.   Past Medical History:  Diagnosis Date   Anxiety    Asthma    Depression    Depression    Diabetes mellitus    Fibromyalgia    GERD (gastroesophageal reflux disease)    Goiter    Hair loss    Hypertension    Menopause    Other and unspecified hyperlipidemia    Positive PPD    Small bowel obstruction (HCC) 02/09/2018   SVT (supraventricular tachycardia) (HCC)    Vasculitis (HCC)    Past Surgical History:  Procedure Laterality Date   APPENDECTOMY     CHOLECYSTECTOMY     PARTIAL HYSTERECTOMY     Social History   Socioeconomic History   Marital status: Married    Spouse name: Not on file   Number of children: 2   Years of education: Not on file   Highest education level: Not on file  Occupational History   Not on file  Social Needs   Financial resource strain: Not on file   Food insecurity    Worry: Not on file    Inability: Not on file   Transportation needs    Medical: Not on file    Non-medical: Not on file  Tobacco  Use   Smoking status: Former Smoker    Years: 15.00    Types: Cigarettes    Quit date: 03/13/1988    Years since quitting: 31.3   Smokeless tobacco: Never Used  Substance and Sexual Activity   Alcohol use: No   Drug use: No   Sexual activity: Not on file  Lifestyle   Physical activity    Days per week: Not on file    Minutes per session: Not on file   Stress: Not on file  Relationships   Social connections    Talks on phone: Not on file    Gets together: Not on file    Attends religious  service: Not on file    Active member of club or organization: Not on file    Attends meetings of clubs or organizations: Not on file    Relationship status: Not on file   Intimate partner violence    Fear of current or ex partner: Not on file    Emotionally abused: Not on file    Physically abused: Not on file    Forced sexual activity: Not on file  Other Topics Concern   Not on file  Social History Narrative   Caffeine 1-2 cups coffee daily.   Lives at home with husband and daughter.   Retired.   Associates degree.   Has 4 kids.   ROS  Review of Systems  Constitution: Negative for decreased appetite, malaise/fatigue, weight gain and weight loss.  Eyes: Negative for visual disturbance.  Cardiovascular: Positive for chest pain, dyspnea on exertion and palpitations (approximately once a month). Negative for claudication, leg swelling, orthopnea and syncope.  Respiratory: Positive for snoring (on CPAP) and wheezing. Negative for hemoptysis.   Endocrine: Negative for cold intolerance and heat intolerance.  Hematologic/Lymphatic: Does not bruise/bleed easily.  Skin: Negative for nail changes.  Musculoskeletal: Positive for joint pain and muscle cramps (fibromyalgia). Negative for muscle weakness and myalgias.  Gastrointestinal: Negative for abdominal pain, change in bowel habit, nausea and vomiting.  Neurological: Negative for difficulty with concentration, dizziness, focal weakness and headaches.  Psychiatric/Behavioral: Negative for altered mental status and suicidal ideas.  All other systems reviewed and are negative.  Objective   Vitals with BMI 07/09/2019 06/25/2019 01/25/2019  Height 5\' 3"  5\' 3"  -  Weight 169 lbs 172 lbs 168 lbs 6 oz  BMI 29.94 30.48 -  Systolic 110 129 [id number]  Diastolic 51 54 64  Pulse 69 56 59    Blood pressure (!) 110/51, pulse 69, height 5\' 3"  (1.6 m), weight 169 lb (76.7 kg), SpO2 96 %. Body mass index is 29.94 kg/m.  Physical exam not performed or  limited due to virtual visit.    Please see exam details from prior visit is as below.  Physical Exam  Constitutional:  Moderately built and mildly obese in no acute distress.  Most obesity is truncal obesity.  HENT:  Head: Atraumatic.  Eyes: Conjunctivae are normal.  Neck: Neck supple. No JVD present. No thyromegaly present.  Cardiovascular: Normal rate, regular rhythm and intact distal pulses. Exam reveals no gallop.  Murmur heard.  Midsystolic murmur is present with a grade of 2/6 at the upper right sternal border and apex. No leg edema, no JVD.  Pulmonary/Chest: Effort normal and breath sounds normal.  Abdominal: Soft. Bowel sounds are normal.  Reducible ventral hernia noted.  Obese abdomen.  Musculoskeletal: Normal range of motion.  Neurological: She is alert.  Skin: Skin is warm and dry.  Psychiatric: She has a normal mood and affect.   Radiology: No results found.  Laboratory examination:   No results for input(s): NA, K, CL, CO2, GLUCOSE, BUN, CREATININE, CALCIUM, GFRNONAA, GFRAA in the last 8760 hours. CMP Latest Ref Rng & Units 02/11/2018 02/10/2018 02/09/2018  Glucose 65 - 99 mg/dL 914(N) 829(F) 71  BUN 6 - 20 mg/dL Creatinine 0.44 - 1.00 mg/dL 6.21 3.08 6.57  Sodium 135 - 145 mmol/L 141 139 139  Potassium 3.5 - 5.1 mmol/L 4.4 5.6(H) 4.6  Chloride 101 - 111 mmol/L 108 105 101  CO2 22 - 32 mmol/L Calcium 8.9 - 10.3 mg/dL 8.0(L) 8.7(L) 9.8  Total Protein 6.5 - 8.1 g/dL - - 8.0  Total Bilirubin 0.3 - 1.2 mg/dL - - 0.7  Alkaline Phos 38 - 126 U/L - - 58  AST 15 - 41 U/L - - 21  ALT 14 - 54 U/L - - 15   CBC Latest Ref Rng & Units 02/10/2018 02/09/2018 02/09/2018  WBC 4.0 - 10.5 K/uL 11.0(H) 12.0(H) 13.2(H)  Hemoglobin 12.0 - 15.0 g/dL 84.6 96.2 95.2  Hematocrit 36.0 - 46.0 % 42.8 44.9 41.4  Platelets 150 - 400 K/uL 198 242 267   Lipid Panel     Component Value Date/Time   CHOL 147 01/09/2018 0823   TRIG 252 (H) 01/09/2018 0823   HDL 40 (L)  01/09/2018 0823   CHOLHDL 3.7 01/09/2018 0823   VLDL 28 08/28/2015 1050   LDLCALC 74 01/09/2018 0823   HEMOGLOBIN A1C Lab Results  Component Value Date   HGBA1C 6.7 12/20/2014   TSH No results for input(s): TSH in the last 8760 hours. Medications and allergies   Allergies  Allergen Reactions   Penicillins Anaphylaxis    Has patient had a PCN reaction causing immediate rash, facial/tongue/throat swelling, SOB or lightheadedness with hypotension: No Has patient had a PCN reaction causing severe rash involving mucus membranes or skin necrosis: No Has patient had a PCN reaction that required hospitalization: Yes/ Has patient had a PCN reaction occurring within the last 10 years: No If all of the above answers are "NO", then may proceed with Cephalosporin use.    Ace Inhibitors     INDUCED BRONCHOSPASM     Prior to Admission medications   Medication Sig Start Date End Date Taking? Authorizing Provider  albuterol (PROVENTIL HFA;VENTOLIN HFA) 108 (90 Base) MCG/ACT inhaler Inhale 2 puffs into the lungs every 6 (six) hours as needed. 01/09/18   Allayne Butcher B, PA-C  albuterol (PROVENTIL) (2.5 MG/3ML) 0.083% nebulizer solution Take 3 mLs (2.5 mg total) by nebulization every 6 (six) hours as needed. WUX32:G40.102 01/10/18   Dorena Bodo, PA-C  ALPRAZolam Prudy Feeler) 1 MG tablet TAKE ONE-HALF TABLET BY MOUTH ONCE DAILY AS NEEDED FOR STRESS 07/06/18   Donita Brooks, MD  amLODipine (NORVASC) 10 MG tablet One a day 03/04/16   Collene Gobble, MD  aspirin 81 MG tablet Take 81 mg by mouth daily.    [provider]  atenolol (TENORMIN) 50 MG tablet Take 1 tablet by mouth twice daily 12/11/18   Patwardhan, Manish J, MD  atorvastatin (LIPITOR) 40 MG tablet Take 1 tablet (40 mg total) daily by mouth. 08/01/17   Dorena Bodo, PA-C  augmented betamethasone dipropionate (DIPROLENE-AF) 0.05 % ointment Apply topically 2 (two) times daily. 03/25/15   Collene Gobble, MD  BIOTIN PO Take 10,000 mcg by  mouth daily.  [provider]  carboxymethylcellulose (REFRESH PLUS) 0.5 % SOLN Place 1 drop into both eyes 2 (two) times daily as needed (dry eyes).    [provider]  Cholecalciferol (VITAMIN D) 2000 units CAPS Take 1 capsule (2,000 Units total) by mouth daily. 11/28/17   Orlena Sheldon, PA-C  Cyanocobalamin (VITAMIN B-12) 5000 MCG SUBL Place 5,000 mcg under the tongue daily.    [provider]  Dextrose, Diabetic Use, (GLUCOSE PO) Take by mouth as needed.     [provider]  levocetirizine (XYZAL ALLERGY 24HR) 5 MG tablet Take 1 tablet (5 mg total) by mouth every evening. 01/17/19   Delsa Grana, PA-C  levothyroxine (SYNTHROID, LEVOTHROID) 112 MCG tablet Take 1 tablet (112 mcg total) by mouth daily. 07/06/18   Susy Frizzle, MD  losartan (COZAAR) 100 MG tablet Take 1 tablet (100 mg total) by mouth daily. 07/06/18   Susy Frizzle, MD  Lysine 500 MG TABS Take 500 mg by mouth 2 (two) times a day.    [provider]  Magnesium 400 MG TABS Take 400 mg by mouth daily.    [provider]  Multiple Vitamins-Minerals (MULTIVITAMIN WITH MINERALS) tablet Take 1 tablet by mouth daily.    [provider]  naproxen sodium (ANAPROX) 220 MG tablet Take 220 mg by mouth as needed. Reported on 03/04/2016    [provider]  NOVOLIN R RELION 100 UNIT/ML injection  01/12/17   [provider]  potassium chloride SA (K-DUR,KLOR-CON) 20 MEQ tablet Take 1 tablet (20 mEq total) by mouth 2 (two) times daily. 11/28/17   Orlena Sheldon, PA-C  Probiotic Product (PROBIOTIC DAILY PO) Take by mouth.    [provider]  rOPINIRole (REQUIP) 0.25 MG tablet TAKE 1 TABLET BY MOUTH AT BEDTIME 07/17/18   Delsa Grana, PA-C  sertraline (ZOLOFT) 50 MG tablet Take 1 tablet (50 mg total) by mouth daily. 11/28/17   Orlena Sheldon, PA-C  simethicone (GAS-X) 80 MG chewable tablet Chew 1 tablet (80 mg total) by mouth 4 (four) times daily as needed for  flatulence. 02/13/18 02/13/19  Debbe Odea, MD  traMADol (ULTRAM) 50 MG tablet Take 50 mg by mouth every 12 (twelve) hours as needed.    [provider]     Current Outpatient Medications  Medication Instructions   albuterol (PROVENTIL HFA;VENTOLIN HFA) 108 (90 Base) MCG/ACT inhaler 2 puffs, Inhalation, Every 6 hours PRN   albuterol (PROVENTIL) 2.5 mg, Nebulization, Every 6 hours PRN, ICD10:J45.909   ALPRAZolam (XANAX) 1 MG tablet TAKE ONE-HALF TABLET BY MOUTH ONCE DAILY AS NEEDED FOR STRESS   amLODipine (NORVASC) 10 MG tablet One a day   aspirin 81 mg, Oral, Daily   atenolol (TENORMIN) 50 MG tablet Take 1 tablet by mouth twice daily   atorvastatin (LIPITOR) 40 mg, Oral, Daily   augmented betamethasone dipropionate (DIPROLENE-AF) 0.05 % ointment Topical, 2 times daily   BIOTIN PO 10,000 mcg, Oral, Daily   Dextrose, Diabetic Use, (GLUCOSE PO) Oral, As needed   levocetirizine (XYZAL ALLERGY 24HR) 5 mg, Oral, Every evening   levothyroxine (SYNTHROID) 112 mcg, Oral, Daily   losartan (COZAAR) 100 mg, Oral, Daily   Lysine 500 mg, Oral, 2 times daily   Magnesium 400 mg, Oral, Daily   Melatonin 10 MG TABS 1 tablet, Oral, Daily   Multiple Vitamins-Minerals (MULTIVITAMIN WITH MINERALS) tablet 1 tablet, Oral, Daily   naproxen sodium (ALEVE) 220 mg, Oral, As needed, Reported on 03/04/2016   NON FORMULARY  1 tablet, Oral, Daily, neuriva    NOVOLIN R RELION 100 UNIT/ML injection No dose, route, or frequency recorded.   Omega 3-6-9 Fatty Acids (OMEGA 3-6-9 COMPLEX PO) 1 tablet, Oral, Daily   polyethylene glycol powder (MIRALAX) 17 g, Oral, Daily   potassium chloride (KLOR-CON) 20 MEQ packet 20 mEq, Oral, 2 times daily   potassium chloride SA (K-DUR,KLOR-CON) 20 MEQ tablet 20 mEq, Oral, 2 times daily   Probiotic Product (PROBIOTIC DAILY PO) Oral   rOPINIRole (REQUIP) 0.25 MG tablet TAKE 1 TABLET BY MOUTH AT BEDTIME   sertraline (ZOLOFT) 50 mg, Oral, Daily    simethicone (GAS-X) 80 mg, Oral, 4 times daily PRN   traMADol (ULTRAM) 50 mg, Oral, Every 12 hours PRN   Turmeric 500 MG CAPS 1 tablet, Oral, Daily   Vitamin B-12 5,000 mcg, Sublingual, Daily   Vitamin D 2,000 Units, Oral, Daily    Cardiac Studies:   Exercise myoview stress 09/02/2014: 1. The resting electrocardiogram demonstrated normal sinus rhythm, normal resting conduction, no resting arrhythmias and nonspecific ST-T changes. The stress electrocardiogram was normal. The patient performed treadmill exercise using a Bruce protocol, completing 6:45 minutes. The patient completed an estimated workload of 10.2 METS. The blood pressure response to exercise was normal. The stress test was terminated because of fatigue. 2. The overall quality of the study is good. There is no evidence of abnormal lung activity. Stress and rest SPECT images demonstrate homogeneous tracer distribution throughout the myocardium. Gated SPECT imaging reveals normal myocardial thickening and wall motion. The left ventricular ejection fraction was normal (44%) but visually appears to be normal. This is a low risk study. No significant change from Exercise sestamibi stress test on 05/08/2009.  Sleep study 10/17/2014 (Dohmeier, MD): Mild to moderate obstructive sleep apnea. Effectively treated with CPAP. Compliant  Echocardiogram 07/02/2019: Left ventricle cavity is normal in size. Moderate concentric hypertrophy of the left ventricle. Normal LV systolic function with EF 55%. Normal global wall motion. Doppler evidence of grade I (impaired) diastolic dysfunction, normal LAP.  Mild (Grade I) mitral regurgitation. Likely bicuspid aortic valve with raphe between right and left coronary cusp. Moderate leaflet and annular calcification.  Mild to moderate aortic regurgitation. Moderate to severe aortic stenosis. Aortic valve mean gradient of 31 mmHg, Vmax of 3.8  m/s. Calculated aortic valve area by continuity equation is 0.6 cm.  Both aortic stenosis and regurgitation progressed in severity compared to previous study in 2015. Consider further invasive workup, if clinically indicated. AVA (VTI) measures 0.6 cm^2. AV Mean Grad measures 31.2 mmHg. AV Pk Vel measures 3.8 m/s.  Assessment     ICD-10-CM   1. Bicuspid aortic valve  Q23.1 PCV ECHOCARDIOGRAM COMPLETE  2. Moderate to severe aortic stenosis  I35.0 PCV ECHOCARDIOGRAM COMPLETE  3. Primary hypertension  I10     EKG 06/25/2019: Sinus bradycardia at 52 bpm, normal axis, IRBBB, no evidence of ischemia. IRBBB is new compared to previous EKG 06/19/2018.  Recommendations:   ROJEAN IGE  is a 68 y.o. Caucasian female with  hypertension, controlled IDDM, mild obesity, hyperlipidemia, fibromyalgia and PSVT. Echocardiogram on 09/04/2014 at revealed moderate LVH, EF 59%, bicuspid aortic valve with trace aortic stenosis.   Repeat echocardiogram no on 07/02/2019 reveals significant progression of aortic stenosis.  However she remains asymptomatic with stable dyspnea and chest pain symptoms appear to be mostly musculoskeletal versus GERD occurring mostly during rest or when she lays down in bed.  I have reviewed the results of the echocardiogram and discussed presentation  of aortic stenosis in detail with patient.  In view of progression of aortic stenosis, would like to repeat echocardiogram in 6 months and see her back at that time.  With regard to hypertension, blood pressure is well controlled, she is on a statin for hyperlipidemia and on appropriate medical therapy.  She will contact me if she has exertional worsening dyspnea or exertional chest pain.  CC: Sabino Gasser, MD

## 2019-07-13 DIAGNOSIS — E039 Hypothyroidism, unspecified: Secondary | ICD-10-CM | POA: Diagnosis not present

## 2019-07-13 DIAGNOSIS — I10 Essential (primary) hypertension: Secondary | ICD-10-CM | POA: Diagnosis not present

## 2019-07-13 DIAGNOSIS — E109 Type 1 diabetes mellitus without complications: Secondary | ICD-10-CM | POA: Diagnosis not present

## 2019-07-13 DIAGNOSIS — E7800 Pure hypercholesterolemia, unspecified: Secondary | ICD-10-CM | POA: Diagnosis not present

## 2019-07-13 DIAGNOSIS — E049 Nontoxic goiter, unspecified: Secondary | ICD-10-CM | POA: Diagnosis not present

## 2019-07-13 DIAGNOSIS — Z23 Encounter for immunization: Secondary | ICD-10-CM | POA: Diagnosis not present

## 2019-07-13 DIAGNOSIS — Z9641 Presence of insulin pump (external) (internal): Secondary | ICD-10-CM | POA: Diagnosis not present

## 2019-07-16 DIAGNOSIS — E039 Hypothyroidism, unspecified: Secondary | ICD-10-CM | POA: Diagnosis not present

## 2019-07-16 DIAGNOSIS — E7800 Pure hypercholesterolemia, unspecified: Secondary | ICD-10-CM | POA: Diagnosis not present

## 2019-07-16 DIAGNOSIS — E109 Type 1 diabetes mellitus without complications: Secondary | ICD-10-CM | POA: Diagnosis not present

## 2019-07-17 ENCOUNTER — Other Ambulatory Visit: Payer: Self-pay | Admitting: Cardiology

## 2019-07-23 DIAGNOSIS — M4690 Unspecified inflammatory spondylopathy, site unspecified: Secondary | ICD-10-CM | POA: Diagnosis not present

## 2019-07-23 DIAGNOSIS — F418 Other specified anxiety disorders: Secondary | ICD-10-CM | POA: Diagnosis not present

## 2019-07-23 DIAGNOSIS — E109 Type 1 diabetes mellitus without complications: Secondary | ICD-10-CM | POA: Diagnosis not present

## 2019-07-23 DIAGNOSIS — Q231 Congenital insufficiency of aortic valve: Secondary | ICD-10-CM | POA: Diagnosis not present

## 2019-07-23 DIAGNOSIS — J309 Allergic rhinitis, unspecified: Secondary | ICD-10-CM | POA: Diagnosis not present

## 2019-07-23 DIAGNOSIS — Z0000 Encounter for general adult medical examination without abnormal findings: Secondary | ICD-10-CM | POA: Diagnosis not present

## 2019-07-23 DIAGNOSIS — I350 Nonrheumatic aortic (valve) stenosis: Secondary | ICD-10-CM | POA: Diagnosis not present

## 2019-07-23 DIAGNOSIS — I10 Essential (primary) hypertension: Secondary | ICD-10-CM | POA: Diagnosis not present

## 2019-07-23 DIAGNOSIS — Z794 Long term (current) use of insulin: Secondary | ICD-10-CM | POA: Diagnosis not present

## 2019-07-23 DIAGNOSIS — E039 Hypothyroidism, unspecified: Secondary | ICD-10-CM | POA: Diagnosis not present

## 2019-10-12 DIAGNOSIS — Z9641 Presence of insulin pump (external) (internal): Secondary | ICD-10-CM | POA: Diagnosis not present

## 2019-10-12 DIAGNOSIS — I10 Essential (primary) hypertension: Secondary | ICD-10-CM | POA: Diagnosis not present

## 2019-10-12 DIAGNOSIS — E049 Nontoxic goiter, unspecified: Secondary | ICD-10-CM | POA: Diagnosis not present

## 2019-10-12 DIAGNOSIS — Z794 Long term (current) use of insulin: Secondary | ICD-10-CM | POA: Diagnosis not present

## 2019-10-12 DIAGNOSIS — E7800 Pure hypercholesterolemia, unspecified: Secondary | ICD-10-CM | POA: Diagnosis not present

## 2019-10-12 DIAGNOSIS — E109 Type 1 diabetes mellitus without complications: Secondary | ICD-10-CM | POA: Diagnosis not present

## 2019-10-12 DIAGNOSIS — E039 Hypothyroidism, unspecified: Secondary | ICD-10-CM | POA: Diagnosis not present

## 2019-11-03 ENCOUNTER — Ambulatory Visit: Payer: Medicare Other | Attending: Internal Medicine

## 2019-11-03 DIAGNOSIS — Z23 Encounter for immunization: Secondary | ICD-10-CM | POA: Insufficient documentation

## 2019-11-03 NOTE — Progress Notes (Signed)
   Covid-19 Vaccination Clinic  Name:  [name]    MRN: [id number] DOB: 19-Nov-1950  11/03/2019  [name] was observed post Covid-19 immunization for 30 minutes based on pre-vaccination screening without incidence. She was provided with Vaccine Information Sheet and instruction to access the V-Safe system.   [name] was instructed to call 911 with any severe reactions post vaccine: Marland Kitchen Difficulty breathing  . Swelling of your face and throat  . A fast heartbeat  . A bad rash all over your body  . Dizziness and weakness    Immunizations Administered    Name Date Dose VIS Date Route   Pfizer COVID-19 Vaccine 11/03/2019  1:10 PM 0.3 mL 08/24/2019 Intramuscular   Manufacturer: [employer], [employer]   Lot: [id number]   NDC: [address]-8

## 2019-11-16 ENCOUNTER — Other Ambulatory Visit: Payer: Self-pay | Admitting: Cardiology

## 2019-11-16 DIAGNOSIS — I10 Essential (primary) hypertension: Secondary | ICD-10-CM

## 2019-11-27 ENCOUNTER — Ambulatory Visit: Payer: Medicare Other | Attending: Internal Medicine

## 2019-11-27 DIAGNOSIS — Z23 Encounter for immunization: Secondary | ICD-10-CM

## 2019-11-27 NOTE — Progress Notes (Signed)
   Covid-19 Vaccination Clinic  Name:  [name]    MRN: [id number] DOB: 1950/11/17  11/27/2019  [name] was observed post Covid-19 immunization for 30 minutes based on pre-vaccination screening without incident. She was provided with Vaccine Information Sheet and instruction to access the V-Safe system.   [name] was instructed to call 911 with any severe reactions post vaccine: Marland Kitchen Difficulty breathing  . Swelling of face and throat  . A fast heartbeat  . A bad rash all over body  . Dizziness and weakness   Immunizations Administered    Name Date Dose VIS Date Route   Pfizer COVID-19 Vaccine 11/27/2019 12:08 PM 0.3 mL 08/24/2019 Intramuscular   Manufacturer: [employer], [employer]   Lot: [id number]   NDC: [address]-0

## 2019-12-13 ENCOUNTER — Other Ambulatory Visit: Payer: Self-pay

## 2019-12-13 ENCOUNTER — Ambulatory Visit: Payer: Medicare Other

## 2019-12-13 DIAGNOSIS — I350 Nonrheumatic aortic (valve) stenosis: Secondary | ICD-10-CM

## 2019-12-13 DIAGNOSIS — Q231 Congenital insufficiency of aortic valve: Secondary | ICD-10-CM | POA: Diagnosis not present

## 2019-12-14 ENCOUNTER — Other Ambulatory Visit: Payer: Medicare Other

## 2020-01-11 ENCOUNTER — Ambulatory Visit: Payer: Medicare Other | Admitting: Cardiology

## 2020-01-11 DIAGNOSIS — I10 Essential (primary) hypertension: Secondary | ICD-10-CM | POA: Diagnosis not present

## 2020-01-11 DIAGNOSIS — E049 Nontoxic goiter, unspecified: Secondary | ICD-10-CM | POA: Diagnosis not present

## 2020-01-11 DIAGNOSIS — Z9641 Presence of insulin pump (external) (internal): Secondary | ICD-10-CM | POA: Diagnosis not present

## 2020-01-11 DIAGNOSIS — E7800 Pure hypercholesterolemia, unspecified: Secondary | ICD-10-CM | POA: Diagnosis not present

## 2020-01-11 DIAGNOSIS — E039 Hypothyroidism, unspecified: Secondary | ICD-10-CM | POA: Diagnosis not present

## 2020-01-11 DIAGNOSIS — E109 Type 1 diabetes mellitus without complications: Secondary | ICD-10-CM | POA: Diagnosis not present

## 2020-01-11 DIAGNOSIS — Z794 Long term (current) use of insulin: Secondary | ICD-10-CM | POA: Diagnosis not present

## 2020-01-11 NOTE — Progress Notes (Signed)
Primary Physician/Referring:  Irena Reichmann, DO  Patient ID: Brand Males, female    DOB: 07/24/1951, 69 y.o.   MRN: [id number]  Chief Complaint  Patient presents with  . Aortic Stenosis  . Results    Echocardiogram  . Follow-up    6 month  . Chest Pain   HPI:    [name]  is a 69 y.o. Caucasian female with hypertension, controlled IDDM, mild obesity, hyperlipidemia, fibromyalgia and PSVT, bicuspid aortic valve with trace aortic stenosis. No pulmonary hypertension.  Over the past 3 months she has been noticing chest tightness in the middle of the night.  Last for 15 to 20 minutes and subside spontaneously.  On further questioning she has also noticed significant heartburn.  No exertional component or chest pain, dyspnea has remained stable.  She does have generalized myalgias from fibromyalgia's.  No dizziness or syncope.  Past Medical History:  Diagnosis Date  . Anxiety   . Asthma   . Depression   . Depression   . Diabetes mellitus   . Fibromyalgia   . GERD (gastroesophageal reflux disease)   . Goiter   . Hair loss   . Hypertension   . Menopause   . Other and unspecified hyperlipidemia   . Positive PPD   . Small bowel obstruction (HCC) 02/09/2018  . SVT (supraventricular tachycardia) (HCC)   . Vasculitis [facility])    Past Surgical History:  Procedure Laterality Date  . APPENDECTOMY    . CHOLECYSTECTOMY    . PARTIAL HYSTERECTOMY     Family History  Problem Relation Age of Onset  . Hepatitis Mother 41       died from Hep A  . Diabetes Father   . Cancer Brother   . Crohn's disease Brother   . Alcohol abuse Brother     Social History   Tobacco Use  . Smoking status: Former Smoker    Years: 15.00    Types: Cigarettes    Quit date: 03/13/1988    Years since quitting: 31.8  . Smokeless tobacco: Never Used  Substance Use Topics  . Alcohol use: No   Marital Status: Married  ROS  Review of Systems  Cardiovascular: Positive for chest pain and  dyspnea on exertion. Negative for leg swelling.  Musculoskeletal: Positive for arthritis, back pain and myalgias.  Gastrointestinal: Positive for heartburn. Negative for melena.   Objective  Blood pressure (!) 145/55, pulse 61, temperature 98.3 F (36.8 C), temperature source Temporal, resp. rate 16, height 5\' 3"  (1.6 m), weight 175 lb 12.8 oz (79.7 kg), SpO2 96 %.  Vitals with BMI 01/14/2020 07/09/2019 06/25/2019  Height 5\' 3"  5\' 3"  5\' 3"   Weight 175 lbs 13 oz 169 lbs 172 lbs  BMI 31.15 29.94 30.48  Systolic 145 110 08/25/2019  Diastolic 55 51 54  Pulse 61 69 56     Physical Exam  Constitutional: No distress.  Moderately built and mildly obese.  Most obesity is truncal obesity.  HENT:  Head: Atraumatic.  Neck: No JVD present.  Cardiovascular: Normal rate, regular rhythm and intact distal pulses. Exam reveals no gallop.  Murmur heard.  Midsystolic murmur is present with a grade of 3/6 at the upper right sternal border and apex radiating to the neck. Pulses:      Carotid pulses are on the right side with bruit and on the left side with bruit.      Dorsalis pedis pulses are 2+ on the right side and 2+ on the  left side.       Posterior tibial pulses are 2+ on the right side and 2+ on the left side.  No leg edema, no JVD.   Pulmonary/Chest: Effort normal and breath sounds normal. No accessory muscle usage. No respiratory distress.  Abdominal: Soft. Bowel sounds are normal.  Reducible ventral hernia noted.  Obese abdomen.   Skin: Skin is dry.   Laboratory examination:   No results for input(s): NA, K, CL, CO2, GLUCOSE, BUN, CREATININE, CALCIUM, GFRNONAA, GFRAA in the last 8760 hours. CrCl cannot be calculated (Patient's most recent lab result is older than the maximum 21 days allowed.).  CMP Latest Ref Rng & Units 02/11/2018 02/10/2018 02/09/2018  Glucose 65 - 99 mg/dL 846(K) 599(J) 71  BUN 6 - 20 mg/dL 13 14 17   Creatinine 0.44 - 1.00 mg/dL 5.70 1.77  Sodium 135 - 145 mmol/L 141 139  139  Potassium 3.5 - 5.1 mmol/L 4.4 5.6(H) 4.6  Chloride 101 - 111 mmol/L 108 105 101  CO2 22 - 32 mmol/L 25 23 29   Calcium 8.9 - 10.3 mg/dL 8.0(L) 8.7(L) 9.8  Total Protein 6.5 - 8.1 g/dL - - 8.0  Total Bilirubin 0.3 - 1.2 mg/dL - - 0.7  Alkaline Phos 38 - 126 U/L - - 58  AST 15 - 41 U/L - - 21  ALT 14 - 54 U/L - - 15   CBC Latest Ref Rng & Units 02/10/2018 02/09/2018 02/09/2018  WBC 4.0 - 10.5 K/uL 11.0(H) 12.0(H) 13.2(H)  Hemoglobin 12.0 - 15.0 g/dL 02/11/2018 02/11/2018 03.0  Hematocrit 36.0 - 46.0 % 42.8 44.9 41.4  Platelets 150 - 400 K/uL 198 242 267   Lipid Panel     Component Value Date/Time   CHOL 147 01/09/2018 0823   TRIG 252 (H) 01/09/2018 0823   HDL 40 (L) 01/09/2018 0823   CHOLHDL 3.7 01/09/2018 0823   VLDL 28 08/28/2015 1050   LDLCALC 74 01/09/2018 0823   HEMOGLOBIN A1C Lab Results  Component Value Date   HGBA1C 6.7 12/20/2014   TSH No results for input(s): TSH in the last 8760 hours.  External labs:   NA  Medications and allergies   Allergies  Allergen Reactions  . Penicillins Anaphylaxis    Has patient had a PCN reaction causing immediate rash, facial/tongue/throat swelling, SOB or lightheadedness with hypotension: No Has patient had a PCN reaction causing severe rash involving mucus membranes or skin necrosis: No Has patient had a PCN reaction that required hospitalization: Yes/ Has patient had a PCN reaction occurring within the last 10 years: No If all of the above answers are "NO", then may proceed with Cephalosporin use.   . Ace Inhibitors     INDUCED BRONCHOSPASM     Current Outpatient Medications  Medication Instructions  . albuterol (PROVENTIL HFA;VENTOLIN HFA) 108 (90 Base) MCG/ACT inhaler 2 puffs, Inhalation, Every 6 hours PRN  . albuterol (PROVENTIL) 2.5 mg, Nebulization, Every 6 hours PRN, ICD10:J45.909  . ALPRAZolam (XANAX) 1 MG tablet TAKE ONE-HALF TABLET BY MOUTH ONCE DAILY AS NEEDED FOR STRESS  . amLODipine (NORVASC) 10 MG tablet Take 1  tablet by mouth once daily  . aspirin 81 mg, Oral, Daily  . atenolol (TENORMIN) 100 mg, Oral, 2 times daily  . atorvastatin (LIPITOR) 40 mg, Oral, Daily  . augmented betamethasone dipropionate (DIPROLENE-AF) 0.05 % ointment Topical, 2 times daily  . BIOTIN PO 10,000 mcg, Oral, Daily  . Dextrose, Diabetic Use, (GLUCOSE PO) Oral, As needed  . gabapentin (NEURONTIN)  300 mg, Oral, 2 times daily  . levocetirizine (XYZAL ALLERGY 24HR) 5 mg, Oral, Every evening  . levothyroxine (SYNTHROID) 112 mcg, Oral, Daily  . losartan-hydrochlorothiazide (HYZAAR) 100-25 MG tablet 1 tablet, Oral, BH-each morning  . Lysine 500 mg, Oral, 2 times daily  . Magnesium 400 mg, Oral, Daily  . Melatonin 10 MG TABS 1 tablet, Oral, Daily  . Multiple Vitamins-Minerals (MULTIVITAMIN WITH MINERALS) tablet 1 tablet, Oral, Daily  . naproxen sodium (ALEVE) 220 mg, Oral, As needed, Reported on 03/04/2016  . Omega 3-6-9 Fatty Acids (OMEGA 3-6-9 COMPLEX PO) 1 tablet, Oral, Daily  . omeprazole (PRILOSEC) 20 mg, Oral, Daily before breakfast, Can use as needed  . polyethylene glycol powder (MIRALAX) 17 g, Oral, Daily  . potassium chloride SA (K-DUR,KLOR-CON) 20 MEQ tablet 20 mEq, Oral, 2 times daily  . Probiotic Product (PROBIOTIC DAILY PO) Oral  . rOPINIRole (REQUIP) 0.25 MG tablet TAKE 1 TABLET BY MOUTH AT BEDTIME  . sertraline (ZOLOFT) 50 mg, Oral, Daily  . simethicone (GAS-X) 80 mg, Oral, 4 times daily PRN  . Turmeric 500 MG CAPS 1 tablet, Oral, Daily  . Vitamin B-12 5,000 mcg, Sublingual, Daily  . Vitamin D 2,000 Units, Oral, Daily   Radiology:   No results found.  Cardiac Studies:   Exercise myoview stress 09/02/2014: 1. The resting electrocardiogram demonstrated normal sinus rhythm, normal resting conduction, no resting arrhythmias and nonspecific ST-T changes. The stress electrocardiogram was normal. The patient performed treadmill exercise using a Bruce protocol, completing 6:45 minutes. The patient completed an  estimated workload of 10.2 METS. The blood pressure response to exercise was normal. The stress test was terminated because of fatigue. 2. The overall quality of the study is good. There is no evidence of abnormal lung activity. Stress and rest SPECT images demonstrate homogeneous tracer distribution throughout the myocardium. Gated SPECT imaging reveals normal myocardial thickening and wall motion. The left ventricular ejection fraction was normal (44%) but visually appears to be normal. This is a low risk study. No significant change from Exercise sestamibi stress test on 05/08/2009.  Sleep study 10/17/2014 (Dohmeier, MD): Mild to moderate obstructive sleep apnea. Effectively treated with CPAP. Compliant  Echocardiogram 07/02/2019: Left ventricle cavity is normal in size. Moderate concentric hypertrophy of the left ventricle. Normal LV systolic function with EF 55%. Normal global wall motion. Doppler evidence of grade I (impaired) diastolic dysfunction, normal LAP.  Mild (Grade I) mitral regurgitation. Likely bicuspid aortic valve with raphe between right and left coronary cusp. Moderate leaflet and annular calcification.  Mild to moderate aortic regurgitation. Moderate to severe aortic stenosis. Aortic valve mean gradient of 31 mmHg, Vmax of 3.8  m/s. Calculated aortic valve area by continuity equation is 0.6 cm. Both aortic stenosis and regurgitation progressed in severity compared to previous study in 2015. Consider further invasive workup, if clinically indicated. AVA (VTI) measures 0.6 cm^2. AV Mean Grad measures 31.2 mmHg. AV Pk Vel measures 3.8 m/s.  Echocardiogram 12/13/2019:  Normal LV systolic function with EF 56%. Left ventricle cavity is normal in size. Mild concentric hypertrophy of the left ventricle. Normal global wall motion. Doppler evidence of grade I (impaired) diastolic dysfunction, normal LAP.  Likely bicuspid aortic valve with raphe between right and left coronary cusp. Moderate  leaflet and annular calcification. Mild to moderate aortic regurgitation. Moderate to severe aortic stenosis. Aortic valve mean  gradient of 10 mmHg, Vmax of 2.2 m/s. Calculated aortic valve area by continuity equation is 1.7 cm. Compared to previous study in 06/2020,  aortic stenosis severity is down from moderate to mild. Suboptimal  Doppler window could lead to underestimation of aortic stenosis severity.  Recommend clinical correlation.  Mild (Grade I) mitral regurgitation.  Inadequate TR jet to estimate pulmonary artery systolic pressure. Normal right atrial pressure.  EKG  EKG 01/14/2020: Sinus bradycardia at rate of 56 bpm, normal axis, incomplete right bundle branch block.  No evidence of ischemia.  No significant change from 06/25/2019.   Assessment     ICD-10-CM   1. Bicuspid aortic valve  Q23.1 EKG 12-Lead    losartan-hydrochlorothiazide (HYZAAR) 100-25 MG tablet  2. Moderate to severe aortic stenosis  I35.0   3. Primary hypertension  I10 atenolol (TENORMIN) 100 MG tablet    losartan-hydrochlorothiazide (HYZAAR) 100-25 MG tablet  4. Gastroesophageal reflux disease without esophagitis  K21.9 omeprazole (PRILOSEC) 20 MG capsule    Meds ordered this encounter  Medications  . omeprazole (PRILOSEC) 20 MG capsule    Sig: Take 1 capsule (20 mg total) by mouth daily before breakfast. Can use as needed    Dispense:  30 capsule    Refill:  2  . atenolol (TENORMIN) 100 MG tablet    Sig: Take 1 tablet (100 mg total) by mouth 2 (two) times daily.    Dispense:  180 tablet    Refill:  3  . losartan-hydrochlorothiazide (HYZAAR) 100-25 MG tablet    Sig: Take 1 tablet by mouth every morning.    Dispense:  90 tablet    Refill:  3    Medications Discontinued During This Encounter  Medication Reason  . NON FORMULARY Patient Preference  . NOVOLIN R RELION 100 UNIT/ML injection Change in therapy  . potassium chloride (KLOR-CON) 20 MEQ packet Change in therapy  . traMADol (ULTRAM) 50 MG  tablet Discontinued by provider  . hydrochlorothiazide (HYDRODIURIL) 25 MG tablet Change in therapy  . losartan (COZAAR) 100 MG tablet Change in therapy  . atenolol (TENORMIN) 50 MG tablet Reorder    Recommendations:   ALTHIA EGOLF  is a 69 y.o. Caucasian female with hypertension, controlled IDDM, mild obesity, hyperlipidemia, fibromyalgia and PSVT, bicuspid aortic valve with trace aortic stenosis. No pulmonary hypertension.   I reviewed the results of the echocardiogram, aortic stenosis is remained stable relatively and also physical examination is not significantly changed.  Her symptoms of chest pain that are occurring at night are clearly indicative of GERD.  She is on multiple supplements, advised her to discontinue this for at least 3 to 4 weeks, and to restart them one at a time but to take it with food.  I also prescribed omeprazole.  Do not suspect angina pectoris.  With regard to SVT, symptoms are remained stable over the last 6 months she has not had any recurrence.  She is presently wearing a Zio patch prescribed by PCP.  Blood pressure is elevated today, I increased atenolol from 50 mg twice daily to 100 mg twice daily and combined her losartan and also HCTZ and 1 pill without change in dosage.  I like to see her back in 6 weeks for follow-up of hypertension with a repeat EKG to follow-up on any bradycardia or heart block in view of high-dose beta-blocker.  I am doing this both in view of bicuspid aortic valve and to decrease the risk of aortopathy from hypertension.  Yates Decamp, MD, [facility] 01/14/2020, 9:53 AM [facility] Pager: [phone] Office: [phone]

## 2020-01-14 ENCOUNTER — Ambulatory Visit: Payer: Medicare Other | Admitting: Cardiology

## 2020-01-14 ENCOUNTER — Encounter: Payer: Self-pay | Admitting: Cardiology

## 2020-01-14 ENCOUNTER — Other Ambulatory Visit: Payer: Self-pay

## 2020-01-14 VITALS — BP 145/55 | HR 61 | Temp 98.3°F | Resp 16 | Ht 63.0 in | Wt 175.8 lb

## 2020-01-14 DIAGNOSIS — K219 Gastro-esophageal reflux disease without esophagitis: Secondary | ICD-10-CM | POA: Diagnosis not present

## 2020-01-14 DIAGNOSIS — I350 Nonrheumatic aortic (valve) stenosis: Secondary | ICD-10-CM

## 2020-01-14 DIAGNOSIS — Q231 Congenital insufficiency of aortic valve: Secondary | ICD-10-CM | POA: Diagnosis not present

## 2020-01-14 DIAGNOSIS — I10 Essential (primary) hypertension: Secondary | ICD-10-CM | POA: Diagnosis not present

## 2020-01-14 MED ORDER — OMEPRAZOLE 20 MG PO CPDR: 20.0000 mg | DELAYED_RELEASE_CAPSULE | Freq: Every day | ORAL | 2 refills | Status: DC

## 2020-01-14 MED ORDER — LOSARTAN POTASSIUM-HCTZ 100-25 MG PO TABS: 1.0000 | ORAL_TABLET | ORAL | 3 refills | Status: DC

## 2020-01-14 MED ORDER — ATENOLOL 100 MG PO TABS: 100.0000 mg | ORAL_TABLET | Freq: Two times a day (BID) | ORAL | 3 refills | Status: DC

## 2020-01-18 DIAGNOSIS — H2513 Age-related nuclear cataract, bilateral: Secondary | ICD-10-CM | POA: Diagnosis not present

## 2020-01-18 DIAGNOSIS — H52223 Regular astigmatism, bilateral: Secondary | ICD-10-CM | POA: Diagnosis not present

## 2020-01-18 DIAGNOSIS — H524 Presbyopia: Secondary | ICD-10-CM | POA: Diagnosis not present

## 2020-01-18 DIAGNOSIS — H5201 Hypermetropia, right eye: Secondary | ICD-10-CM | POA: Diagnosis not present

## 2020-01-23 ENCOUNTER — Encounter: Payer: Self-pay | Admitting: Adult Health

## 2020-01-28 ENCOUNTER — Ambulatory Visit (INDEPENDENT_AMBULATORY_CARE_PROVIDER_SITE_OTHER): Payer: Medicare Other | Admitting: Adult Health

## 2020-01-28 ENCOUNTER — Other Ambulatory Visit: Payer: Self-pay

## 2020-01-28 VITALS — Ht 63.0 in | Wt 173.0 lb

## 2020-01-28 DIAGNOSIS — Z9989 Dependence on other enabling machines and devices: Secondary | ICD-10-CM

## 2020-01-28 DIAGNOSIS — G4733 Obstructive sleep apnea (adult) (pediatric): Secondary | ICD-10-CM | POA: Diagnosis not present

## 2020-01-28 NOTE — Progress Notes (Signed)
PATIENT: DEUNDRA FURBER DOB: 02-07-1951  REASON FOR VISIT: follow up HISTORY FROM: patient  HISTORY OF PRESENT ILLNESS: Today 01/28/20:  Ms. Glock is a 69 year old female with a history of OSA on CPAP. She returns today for follow-up.  Her download indicates that she used her machine 29 out of 30 days for compliance of 97%.  She use her machine greater than 4 hours each night.  On average she uses her machine 8 hours and 49 minutes.  Her residual AHI is 4.1 on 7 cm of water with EPR 3.  She reports that her CPAP continues to work well.  She returns today for follow-up.  HISTORY 01/25/19   Ms. Losada is a 69 year old female with a history of obstructive sleep apnea on CPAP.  She returns today for a virtual visit.  Her download indicates that she use her machine 29 out of 30 days for compliance of 97%.  She use her machine greater than 4 hours each night.  On average she uses her machine 8 hours and 28 minutes.  Her residual AHI is 2.5 on 6 cm of water with EPR of 3.  She does not have a significant leak.  She reports that when she saw Dr. Vickey Huger a year ago her pressure was supposed to be increased to 7.  She denies any new issues with the machine.  She joins me today for a virtual visit.  REVIEW OF SYSTEMS: Out of a complete 14 system review of symptoms, the patient complains only of the following symptoms, and all other reviewed systems are negative.  See HPI  ALLERGIES: Allergies  Allergen Reactions  . Penicillins Anaphylaxis    Has patient had a PCN reaction causing immediate rash, facial/tongue/throat swelling, SOB or lightheadedness with hypotension: No Has patient had a PCN reaction causing severe rash involving mucus membranes or skin necrosis: No Has patient had a PCN reaction that required hospitalization: Yes/ Has patient had a PCN reaction occurring within the last 10 years: No If all of the above answers are "NO", then may proceed with Cephalosporin use.   Donivan Scull  Inhibitors     INDUCED BRONCHOSPASM    HOME MEDICATIONS: Outpatient Medications Prior to Visit  Medication Sig Dispense Refill  . albuterol (PROVENTIL HFA;VENTOLIN HFA) 108 (90 Base) MCG/ACT inhaler Inhale 2 puffs into the lungs every 6 (six) hours as needed. 8.5 g 11  . albuterol (PROVENTIL) (2.5 MG/3ML) 0.083% nebulizer solution Take 3 mLs (2.5 mg total) by nebulization every 6 (six) hours as needed. ICD10:J45.909 75 mL 1  . ALPRAZolam (XANAX) 1 MG tablet TAKE ONE-HALF TABLET BY MOUTH ONCE DAILY AS NEEDED FOR STRESS 90 tablet 0  . amLODipine (NORVASC) 10 MG tablet Take 1 tablet by mouth once daily 90 tablet 0  . aspirin 81 MG tablet Take 81 mg by mouth daily.    Marland Kitchen atenolol (TENORMIN) 100 MG tablet Take 1 tablet (100 mg total) by mouth 2 (two) times daily. 180 tablet 3  . atorvastatin (LIPITOR) 40 MG tablet Take 1 tablet (40 mg total) daily by mouth. 90 tablet 3  . augmented betamethasone dipropionate (DIPROLENE-AF) 0.05 % ointment Apply topically 2 (two) times daily. 30 g 5  . Cholecalciferol (VITAMIN D) 2000 units CAPS Take 1 capsule (2,000 Units total) by mouth daily. 30 capsule 3  . Dextrose, Diabetic Use, (GLUCOSE PO) Take by mouth as needed.     . gabapentin (NEURONTIN) 300 MG capsule Take 300 mg by mouth 2 (  two) times daily.    Marland Kitchen levocetirizine (XYZAL ALLERGY 24HR) 5 MG tablet Take 1 tablet (5 mg total) by mouth every evening. 30 tablet 0  . levothyroxine (SYNTHROID, LEVOTHROID) 112 MCG tablet Take 1 tablet (112 mcg total) by mouth daily. 90 tablet 0  . losartan-hydrochlorothiazide (HYZAAR) 100-25 MG tablet Take 1 tablet by mouth every morning. 90 tablet 3  . Lysine 500 MG TABS Take 500 mg by mouth 2 (two) times a day.    . naproxen sodium (ANAPROX) 220 MG tablet Take 220 mg by mouth as needed. Reported on 03/04/2016    . omeprazole (PRILOSEC) 20 MG capsule Take 1 capsule (20 mg total) by mouth daily before breakfast. Can use as needed 30 capsule 2  . polyethylene glycol powder  (MIRALAX) 17 GM/SCOOP powder Take 17 g by mouth daily.    . potassium chloride SA (K-DUR,KLOR-CON) 20 MEQ tablet Take 1 tablet (20 mEq total) by mouth 2 (two) times daily. 180 tablet 3  . rOPINIRole (REQUIP) 0.25 MG tablet TAKE 1 TABLET BY MOUTH AT BEDTIME 90 tablet 1  . sertraline (ZOLOFT) 50 MG tablet Take 1 tablet (50 mg total) by mouth daily. 90 tablet 3  . simethicone (GAS-X) 80 MG chewable tablet Chew 1 tablet (80 mg total) by mouth 4 (four) times daily as needed for flatulence. 100 tablet 2  . BIOTIN PO Take 10,000 mcg by mouth daily.     . Cyanocobalamin (VITAMIN B-12) 5000 MCG SUBL Place 5,000 mcg under the tongue daily.    . Magnesium 400 MG TABS Take 400 mg by mouth daily.    . Melatonin 10 MG TABS Take 1 tablet by mouth daily.    . Multiple Vitamins-Minerals (MULTIVITAMIN WITH MINERALS) tablet Take 1 tablet by mouth daily.    . Omega 3-6-9 Fatty Acids (OMEGA 3-6-9 COMPLEX PO) Take 1 tablet by mouth daily.    . Probiotic Product (PROBIOTIC DAILY PO) Take by mouth.    . Turmeric 500 MG CAPS Take 1 tablet by mouth daily.     No facility-administered medications prior to visit.    PAST MEDICAL HISTORY: Past Medical History:  Diagnosis Date  . Anxiety   . Asthma   . Depression   . Depression   . Diabetes mellitus   . Fibromyalgia   . GERD (gastroesophageal reflux disease)   . Goiter   . Hair loss   . Hypertension   . Menopause   . Other and unspecified hyperlipidemia   . Positive PPD   . Small bowel obstruction (HCC) 02/09/2018  . SVT (supraventricular tachycardia) (HCC)   . Vasculitis (HCC)     PAST SURGICAL HISTORY: Past Surgical History:  Procedure Laterality Date  . APPENDECTOMY    . CHOLECYSTECTOMY    . PARTIAL HYSTERECTOMY      FAMILY HISTORY: Family History  Problem Relation Age of Onset  . Hepatitis Mother 41       died from Hep A  . Diabetes Father   . Cancer Brother   . Crohn's disease Brother   . Alcohol abuse Brother     SOCIAL  HISTORY: Social History   Socioeconomic History  . Marital status: Married    Spouse name: Not on file  . Number of children: 2  . Years of education: Not on file  . Highest education level: Not on file  Occupational History  . Not on file  Tobacco Use  . Smoking status: Former Smoker    Years: 15.00  Types: Cigarettes    Quit date: 03/13/1988    Years since quitting: 31.8  . Smokeless tobacco: Never Used  Substance and Sexual Activity  . Alcohol use: No  . Drug use: No  . Sexual activity: Not on file  Other Topics Concern  . Not on file  Social History Narrative   Caffeine 1-2 cups coffee daily.   Lives at home with husband and daughter.   Retired.   Associates degree.   Has 4 kids (2 Human resources officer)   Social Determinants of Health   Financial Resource Strain:   . Difficulty of Paying Living Expenses:   Food Insecurity:   . Worried About Programme researcher, broadcasting/film/video in the Last Year:   . Barista in the Last Year:   Transportation Needs:   . Freight forwarder (Medical):   Marland Kitchen Lack of Transportation (Non-Medical):   Physical Activity:   . Days of Exercise per Week:   . Minutes of Exercise per Session:   Stress:   . Feeling of Stress :   Social Connections:   . Frequency of Communication with Friends and Family:   . Frequency of Social Gatherings with Friends and Family:   . Attends Religious Services:   . Active Member of Clubs or Organizations:   . Attends Banker Meetings:   Marland Kitchen Marital Status:   Intimate Partner Violence:   . Fear of Current or Ex-Partner:   . Emotionally Abused:   Marland Kitchen Physically Abused:   . Sexually Abused:       PHYSICAL EXAM  Vitals:   01/28/20 0740  Weight: 173 lb (78.5 kg)  Height: 5\' 3"  (1.6 m)   Body mass index is 30.65 kg/m.  Generalized: Well developed, in no acute distress  Chest: Lungs clear to auscultation bilaterally  Neurological examination  Mentation: Alert oriented to time, place, history taking.  Follows all commands speech and language fluent Cranial nerve II-XII: Extraocular movements were full, visual field were full on confrontational test Head turning and shoulder shrug  were normal and symmetric. Motor: The motor testing reveals 5 over 5 strength of all 4 extremities. Good symmetric motor tone is noted throughout.  Sensory: Sensory testing is intact to soft touch on all 4 extremities. No evidence of extinction is noted.  Gait and station: Gait is normal.    DIAGNOSTIC DATA (LABS, IMAGING, TESTING) - I reviewed patient records, labs, notes, testing and imaging myself where available.  Lab Results  Component Value Date   WBC 11.0 (H) 02/10/2018   HGB 13.8 02/10/2018   HCT 42.8 02/10/2018   MCV 87.9 02/10/2018   PLT 198 02/10/2018      Component Value Date/Time   NA 141 02/11/2018 0404   K 4.4 02/11/2018 0404   CL 108 02/11/2018 0404   CO2 25 02/11/2018 0404   GLUCOSE 127 (H) 02/11/2018 0404   BUN 13 02/11/2018 0404   CREATININE 0.78 02/11/2018 0404   CREATININE 0.97 02/09/2018 1051   CALCIUM 8.0 (L) 02/11/2018 0404   PROT 8.0 02/09/2018 1756   ALBUMIN 4.3 02/09/2018 1756   AST 21 02/09/2018 1756   ALT 15 02/09/2018 1756   ALKPHOS 58 02/09/2018 1756   BILITOT 0.7 02/09/2018 1756   GFRNONAA >60 02/11/2018 0404   GFRNONAA 60 02/09/2018 1051   GFRAA >60 02/11/2018 0404   GFRAA 70 02/09/2018 1051   Lab Results  Component Value Date   CHOL 147 01/09/2018   HDL 40 (L) 01/09/2018  LDLCALC 74 01/09/2018   TRIG 252 (H) 01/09/2018   CHOLHDL 3.7 01/09/2018   Lab Results  Component Value Date   HGBA1C 6.7 12/20/2014   Lab Results  Component Value Date   YBFXOVAN19 166 10/21/2006   Lab Results  Component Value Date   TSH 1.82 01/09/2018      ASSESSMENT AND PLAN 69 y.o. year old female  has a past medical history of Anxiety, Asthma, Depression, Depression, Diabetes mellitus, Fibromyalgia, GERD (gastroesophageal reflux disease), Goiter, Hair loss,  Hypertension, Menopause, Other and unspecified hyperlipidemia, Positive PPD, Small bowel obstruction ([place]) (02/09/2018), SVT (supraventricular tachycardia) ([place]), and Vasculitis ([place]). here with:  1. OSA on CPAP  - CPAP compliance excellent - Good treatment of AHI  - Encourage patient to use CPAP nightly and > 4 hours each night - F/U in 1 year or sooner if needed   I spent 20 minutes of face-to-face and non-face-to-face time with patient.  This included previsit chart review, lab review, study review, order entry, electronic health record documentation, patient education.  Ward Givens, MSN, NP-C 01/28/2020, 8:00 AM [facility] [address], [place], [place] [address] [phone]

## 2020-01-28 NOTE — Patient Instructions (Signed)
Continue using CPAP nightly and greater than 4 hours each night °If your symptoms worsen or you develop new symptoms please let us know.  ° °

## 2020-03-03 ENCOUNTER — Ambulatory Visit: Payer: Medicare Other | Admitting: Cardiology

## 2020-03-03 ENCOUNTER — Other Ambulatory Visit: Payer: Self-pay

## 2020-03-03 ENCOUNTER — Encounter: Payer: Self-pay | Admitting: Cardiology

## 2020-03-03 VITALS — BP 131/51 | HR 52 | Resp 17 | Ht 63.0 in | Wt 179.0 lb

## 2020-03-03 DIAGNOSIS — K219 Gastro-esophageal reflux disease without esophagitis: Secondary | ICD-10-CM

## 2020-03-03 DIAGNOSIS — I471 Supraventricular tachycardia, unspecified: Secondary | ICD-10-CM

## 2020-03-03 DIAGNOSIS — I10 Essential (primary) hypertension: Secondary | ICD-10-CM

## 2020-03-03 DIAGNOSIS — N1831 Chronic kidney disease, stage 3a: Secondary | ICD-10-CM

## 2020-03-03 MED ORDER — OMEPRAZOLE 20 MG PO CPDR: 20.0000 mg | DELAYED_RELEASE_CAPSULE | Freq: Every day | ORAL | 1 refills | Status: DC

## 2020-03-03 NOTE — Progress Notes (Signed)
Primary Physician/Referring:  Janie Morning, DO  Patient ID: [name], female    DOB: 1950/10/12, 69 y.o.   MRN: [id number]  Chief Complaint  Patient presents with  . Hypertension  . Follow-up   HPI:    [name]  is a 69 y.o. Caucasian female with hypertension, controlled IDDM, mild obesity, hyperlipidemia, fibromyalgia and PSVT, bicuspid aortic valve with trace aortic stenosis. No pulmonary hypertension.  I seen her 6 weeks ago and she had been complaining about midsternal chest pain especially at night, suspected GERD and prescribed omeprazole, she has noticed near resolution of her symptoms.  Also increased her atenolol from 50 mg twice daily to 100 mg twice daily which she is tolerating without any side effects.  She brings her labs from her PCP.    Past Medical History:  Diagnosis Date  . Anxiety   . Asthma   . Depression   . Depression   . Diabetes mellitus   . Fibromyalgia   . GERD (gastroesophageal reflux disease)   . Goiter   . Hair loss   . Hypertension   . Menopause   . Other and unspecified hyperlipidemia   . Positive PPD   . Small bowel obstruction ([place]) 02/09/2018  . SVT (supraventricular tachycardia) ([place])   . Vasculitis [facility])    Past Surgical History:  Procedure Laterality Date  . APPENDECTOMY    . CHOLECYSTECTOMY    . PARTIAL HYSTERECTOMY     Family History  Problem Relation Age of Onset  . Hepatitis Mother 18       died from [place]  . Diabetes Father   . Cancer Brother   . Crohn's disease Brother   . Alcohol abuse Brother     Social History   Tobacco Use  . Smoking status: Former Smoker    Years: 15.00    Types: Cigarettes    Quit date: 03/13/1988    Years since quitting: 31.9  . Smokeless tobacco: Never Used  Substance Use Topics  . Alcohol use: No   Marital Status: Married  ROS  Review of Systems  Cardiovascular: Positive for dyspnea on exertion. Negative for leg swelling.  Musculoskeletal: Positive for arthritis,  back pain and myalgias.  Gastrointestinal: Positive for heartburn (improved on PPI). Negative for melena.   Objective  Blood pressure (!) 131/51, pulse (!) 52, resp. rate 17, height _0  (1.6 m), weight 179 lb (81.2 kg), SpO2 94 %.  Vitals with BMI 03/03/2020 01/28/2020 01/14/2020  Height _1  _2  _3   Weight 179 lbs 173 lbs 175 lbs 13 oz  BMI 31.72 78.29 56.21  Systolic 308 - 657  Diastolic 51 - 55  Pulse 52 - 61     Physical Exam Constitutional:      General: She is not in acute distress.    Comments: Moderately built and mildly obese.  Most obesity is truncal obesity.  HENT:     Head: Atraumatic.  Neck:     Vascular: No JVD.  Cardiovascular:     Rate and Rhythm: Normal rate and regular rhythm.     Pulses: Intact distal pulses.          Carotid pulses are on the right side with bruit and on the left side with bruit.      Dorsalis pedis pulses are 2+ on the right side and 2+ on the left side.       Posterior tibial pulses are 2+ on the right side and  2+ on the left side.     Heart sounds: Murmur heard.  Midsystolic murmur is present with a grade of 3/6 at the upper right sternal border and apex radiating to the neck.  No gallop.      Comments: No leg edema, no JVD.  Pulmonary:     Effort: Pulmonary effort is normal. No accessory muscle usage or respiratory distress.     Breath sounds: Normal breath sounds.  Abdominal:     General: Bowel sounds are normal.     Palpations: Abdomen is soft.     Comments: Reducible ventral hernia noted.  Obese abdomen.   Skin:    General: Skin is dry.    Laboratory examination:   No results for input(s): NA, K, CL, CO2, GLUCOSE, BUN, CREATININE, CALCIUM, GFRNONAA, GFRAA in the last 8760 hours. CrCl cannot be calculated (Patient's most recent lab result is older than the maximum 21 days allowed.).  CMP Latest Ref Rng & Units 02/11/2018 02/10/2018 02/09/2018  Glucose 65 - 99 mg/dL 127(H) 146(H) 71  BUN 6 - 20 mg/dL _0 Creatinine 0.44  - 1.00 mg/dL 0.78 0.84 0.91  Sodium 135 - 145 mmol/L 141 139 139  Potassium 3.5 - 5.1 mmol/L 4.4 5.6(H) 4.6  Chloride 101 - 111 mmol/L 108 105 101  CO2 22 - 32 mmol/L _1 Calcium 8.9 - 10.3 mg/dL 8.0(L) 8.7(L) 9.8  Total Protein 6.5 - 8.1 g/dL - - 8.0  Total Bilirubin 0.3 - 1.2 mg/dL - - 0.7  Alkaline Phos 38 - 126 U/L - - 58  AST 15 - 41 U/L - - 21  ALT 14 - 54 U/L - - 15   CBC Latest Ref Rng & Units 02/10/2018 02/09/2018 02/09/2018  WBC 4.0 - 10.5 K/uL 11.0(H) 12.0(H) 13.2(H)  Hemoglobin 12.0 - 15.0 g/dL 13.8 14.9 14.1  Hematocrit 36 - 46 % 42.8 44.9 41.4  Platelets 150 - 400 K/uL 198 242 267   Lipid Panel     Component Value Date/Time   CHOL 147 01/09/2018 0823   TRIG 252 (H) 01/09/2018 0823   HDL 40 (L) 01/09/2018 0823   CHOLHDL 3.7 01/09/2018 0823   VLDL 28 08/28/2015 1050   LDLCALC 74 01/09/2018 0823   HEMOGLOBIN A1C Lab Results  Component Value Date   HGBA1C 6.7 12/20/2014   External labs:   Labs 07/16/2019:  Hb 14.3/HCT 44.1, platelets 266, indicis normal.  Serum glucose 207 mg, BUN 13, creatinine 1.0, EGFR 58 mL.  Potassium 4.8, sodium 141, CMP normal otherwise.  Total cholesterol 126, triglycerides 157, HDL 40, LDL 55.  Non-HDL cholesterol 86.  TSH normal at 0.77.  Medications and allergies   Allergies  Allergen Reactions  . Penicillins Anaphylaxis    Has patient had a PCN reaction causing immediate rash, facial/tongue/throat swelling, SOB or lightheadedness with hypotension: No Has patient had a PCN reaction causing severe rash involving mucus membranes or skin necrosis: No Has patient had a PCN reaction that required hospitalization: Yes/ Has patient had a PCN reaction occurring within the last 10 years: No If all of the above answers are "NO", then may proceed with Cephalosporin use.   . Ace Inhibitors     INDUCED BRONCHOSPASM     Current Outpatient Medications  Medication Instructions  . albuterol (PROVENTIL HFA;VENTOLIN HFA) 108 (90  Base) MCG/ACT inhaler 2 puffs, Inhalation, Every 6 hours PRN  . albuterol (PROVENTIL) 2.5 mg, Nebulization, Every 6 hours PRN, DTO67:T24.580  . ALPRAZolam Duanne Moron)  1 MG tablet TAKE ONE-HALF TABLET BY MOUTH ONCE DAILY AS NEEDED FOR STRESS  . amLODipine (NORVASC) 10 MG tablet Take 1 tablet by mouth once daily  . aspirin 81 mg, Oral, Daily  . atenolol (TENORMIN) 100 mg, Oral, 2 times daily  . atorvastatin (LIPITOR) 40 mg, Oral, Daily  . augmented betamethasone dipropionate (DIPROLENE-AF) 0.05 % ointment Topical, 2 times daily  . Dextrose, Diabetic Use, (GLUCOSE PO) Oral, As needed  . gabapentin (NEURONTIN) 300 mg, Oral, 2 times daily  . levocetirizine (XYZAL ALLERGY 24HR) 5 mg, Oral, Every evening  . levothyroxine (SYNTHROID) 112 mcg, Oral, Daily  . losartan-hydrochlorothiazide (HYZAAR) 100-25 MG tablet 1 tablet, Oral, BH-each morning  . naproxen sodium (ALEVE) 220 mg, Oral, As needed, Reported on 03/04/2016  . omeprazole (PRILOSEC) 20 mg, Oral, Daily before breakfast, Can use as needed  . polyethylene glycol powder (MIRALAX) 17 g, Oral, Daily  . potassium chloride SA (K-DUR,KLOR-CON) 20 MEQ tablet 20 mEq, Oral, 2 times daily  . rOPINIRole (REQUIP) 0.25 MG tablet TAKE 1 TABLET BY MOUTH AT BEDTIME  . sertraline (ZOLOFT) 50 mg, Oral, Daily  . simethicone (GAS-X) 80 mg, Oral, 4 times daily PRN  . Vitamin D 2,000 Units, Oral, Daily   Radiology:   No results found.  Cardiac Studies:   Exercise myoview stress 09/02/2014: 1. The resting electrocardiogram demonstrated normal sinus rhythm, normal resting conduction, no resting arrhythmias and nonspecific ST-T changes. The stress electrocardiogram was normal. The patient performed treadmill exercise using a Bruce protocol, completing 6:45 minutes. The patient completed an estimated workload of 10.2 METS. The blood pressure response to exercise was normal. The stress test was terminated because of fatigue. 2. The overall quality of the study is good.  There is no evidence of abnormal lung activity. Stress and rest SPECT images demonstrate homogeneous tracer distribution throughout the myocardium. Gated SPECT imaging reveals normal myocardial thickening and wall motion. The left ventricular ejection fraction was normal (44%) but visually appears to be normal. This is a low risk study. No significant change from Exercise sestamibi stress test on 05/08/2009.  Sleep study 10/17/2014 (Dohmeier, MD): Mild to moderate obstructive sleep apnea. Effectively treated with CPAP. Compliant  Echocardiogram 12/13/2019:  Normal LV systolic function with EF 56%. Left ventricle cavity is normal in size. Mild concentric hypertrophy of the left ventricle. Normal global wall motion. Doppler evidence of grade I (impaired) diastolic dysfunction, normal LAP.  Likely bicuspid aortic valve with raphe between right and left coronary cusp. Moderate leaflet and annular calcification. Mild to moderate aortic regurgitation. Moderate to severe aortic stenosis. Aortic valve mean  gradient of 10 mmHg, Vmax of 2.2 m/s. Calculated aortic valve area by continuity equation is 1.7 cm. Compared to previous study in 06/2020, aortic stenosis severity is down from moderate to mild. Suboptimal  Doppler window could lead to underestimation of aortic stenosis severity.  Recommend clinical correlation.  Mild (Grade I) mitral regurgitation.  Inadequate TR jet to estimate pulmonary artery systolic pressure. Normal right atrial pressure.  EKG  EKG 03/03/2020: Sinus bradycardia at rate of 54 bpm with borderline first-degree AV block, normal axis.  Incomplete right bundle branch block.  No evidence of ischemia.    EKG 01/14/2020: Sinus bradycardia at rate of 56 bpm, normal axis, incomplete right bundle branch block.  No evidence of ischemia.  No significant change from 06/25/2019.   Assessment     ICD-10-CM   1. SVT (supraventricular tachycardia) (HCC)  I47.1 EKG 12-Lead  2. Primary hypertension   I10  3. Gastroesophageal reflux disease without esophagitis  K21.9 omeprazole (PRILOSEC) 20 MG capsule  4. Type 1 diabetes mellitus with stage 3a chronic kidney disease (HCC)  E10.21    N18.31     Meds ordered this encounter  Medications  . omeprazole (PRILOSEC) 20 MG capsule    Sig: Take 1 capsule (20 mg total) by mouth daily before breakfast. Can use as needed    Dispense:  90 capsule    Refill:  1    Medications Discontinued During This Encounter  Medication Reason  . Lysine 500 MG TABS Discontinued by provider  . omeprazole (PRILOSEC) 20 MG capsule Reorder    Recommendations:   [name]  is a 69 y.o. Caucasian female with hypertension, controlled IDDM, mild obesity, hyperlipidemia, fibromyalgia and PSVT, bicuspid aortic valve with trace aortic stenosis. No pulmonary hypertension.   Since increasing the dose of atenolol from 50 mg twice daily  To 100 mg twice daily, blood pressure is not well controlled and she remains asymptomatic. No palpitations to suggest SVT.  I reviewed her external labs, lipids are under excellent control, mildly elevated triglycerides related to uncontrolled diabetes.  Weight loss was discussed extensively.  I discussed with her regarding stage III chronic kidney disease.  With regard to GERD, symptoms have essentially resolved since being on omeprazole, she request 90-day refills on the same.  Advised her to use it on a as needed basis.  Otherwise stable from cardiac standpoint, I will see her back on annual basis.  Adrian Prows, MD, [facility] 03/03/2020, 10:25 AM [facility] Pager: [phone] Office: [phone]

## 2020-03-31 DIAGNOSIS — H2512 Age-related nuclear cataract, left eye: Secondary | ICD-10-CM | POA: Diagnosis not present

## 2020-04-01 DIAGNOSIS — H2511 Age-related nuclear cataract, right eye: Secondary | ICD-10-CM | POA: Diagnosis not present

## 2020-04-02 ENCOUNTER — Encounter: Payer: Self-pay | Admitting: Adult Health

## 2020-04-03 ENCOUNTER — Encounter: Payer: Self-pay | Admitting: Adult Health

## 2020-04-03 DIAGNOSIS — G4733 Obstructive sleep apnea (adult) (pediatric): Secondary | ICD-10-CM

## 2020-04-03 NOTE — Telephone Encounter (Signed)
Patient's CPAP download indicates that she has used her machine nightly for compliance of 100%.  She is using machine greater than 4 hours 29 days for compliance of 97%.  On average she uses her machine 7 hours and 39 minutes.  Her residual AHI is 4.1 on 7 cm of water with EPR of 3.  Leak in the 95th percentile is 22 L/min.   I will send an order increasing her pressure to 8 cm of water.

## 2020-04-07 NOTE — Telephone Encounter (Signed)
[place], Carleene Cooper, RN; [place], [address]   [place]

## 2020-04-11 DIAGNOSIS — Z9641 Presence of insulin pump (external) (internal): Secondary | ICD-10-CM | POA: Diagnosis not present

## 2020-04-11 DIAGNOSIS — I10 Essential (primary) hypertension: Secondary | ICD-10-CM | POA: Diagnosis not present

## 2020-04-11 DIAGNOSIS — Z794 Long term (current) use of insulin: Secondary | ICD-10-CM | POA: Diagnosis not present

## 2020-04-11 DIAGNOSIS — E039 Hypothyroidism, unspecified: Secondary | ICD-10-CM | POA: Diagnosis not present

## 2020-04-11 DIAGNOSIS — E7800 Pure hypercholesterolemia, unspecified: Secondary | ICD-10-CM | POA: Diagnosis not present

## 2020-04-11 DIAGNOSIS — E109 Type 1 diabetes mellitus without complications: Secondary | ICD-10-CM | POA: Diagnosis not present

## 2020-04-11 DIAGNOSIS — E1165 Type 2 diabetes mellitus with hyperglycemia: Secondary | ICD-10-CM | POA: Diagnosis not present

## 2020-04-24 ENCOUNTER — Other Ambulatory Visit: Payer: Self-pay | Admitting: Cardiology

## 2020-04-24 DIAGNOSIS — I10 Essential (primary) hypertension: Secondary | ICD-10-CM

## 2020-04-28 DIAGNOSIS — H2511 Age-related nuclear cataract, right eye: Secondary | ICD-10-CM | POA: Diagnosis not present

## 2020-06-09 DIAGNOSIS — Z23 Encounter for immunization: Secondary | ICD-10-CM | POA: Diagnosis not present

## 2020-07-10 DIAGNOSIS — E109 Type 1 diabetes mellitus without complications: Secondary | ICD-10-CM | POA: Diagnosis not present

## 2020-07-10 DIAGNOSIS — E7800 Pure hypercholesterolemia, unspecified: Secondary | ICD-10-CM | POA: Diagnosis not present

## 2020-07-10 DIAGNOSIS — E039 Hypothyroidism, unspecified: Secondary | ICD-10-CM | POA: Diagnosis not present

## 2020-07-10 DIAGNOSIS — E049 Nontoxic goiter, unspecified: Secondary | ICD-10-CM | POA: Diagnosis not present

## 2020-07-10 DIAGNOSIS — I10 Essential (primary) hypertension: Secondary | ICD-10-CM | POA: Diagnosis not present

## 2020-07-10 DIAGNOSIS — Z9641 Presence of insulin pump (external) (internal): Secondary | ICD-10-CM | POA: Diagnosis not present

## 2020-07-11 ENCOUNTER — Other Ambulatory Visit: Payer: Self-pay | Admitting: Endocrinology

## 2020-07-11 DIAGNOSIS — E049 Nontoxic goiter, unspecified: Secondary | ICD-10-CM

## 2020-07-14 ENCOUNTER — Other Ambulatory Visit: Payer: Self-pay

## 2020-07-14 DIAGNOSIS — Q231 Congenital insufficiency of aortic valve: Secondary | ICD-10-CM

## 2020-07-14 DIAGNOSIS — I10 Essential (primary) hypertension: Secondary | ICD-10-CM

## 2020-07-14 MED ORDER — AMLODIPINE BESYLATE 10 MG PO TABS: 10.0000 mg | ORAL_TABLET | Freq: Every day | ORAL | 3 refills | Status: DC

## 2020-07-14 MED ORDER — ATENOLOL 100 MG PO TABS: 100.0000 mg | ORAL_TABLET | Freq: Two times a day (BID) | ORAL | 3 refills | Status: DC

## 2020-07-14 MED ORDER — LOSARTAN POTASSIUM-HCTZ 100-25 MG PO TABS: 1.0000 | ORAL_TABLET | ORAL | 3 refills | Status: DC

## 2020-07-17 DIAGNOSIS — I10 Essential (primary) hypertension: Secondary | ICD-10-CM | POA: Diagnosis not present

## 2020-07-17 DIAGNOSIS — E109 Type 1 diabetes mellitus without complications: Secondary | ICD-10-CM | POA: Diagnosis not present

## 2020-07-17 DIAGNOSIS — E039 Hypothyroidism, unspecified: Secondary | ICD-10-CM | POA: Diagnosis not present

## 2020-07-17 DIAGNOSIS — E7800 Pure hypercholesterolemia, unspecified: Secondary | ICD-10-CM | POA: Diagnosis not present

## 2020-07-19 DIAGNOSIS — Z23 Encounter for immunization: Secondary | ICD-10-CM | POA: Diagnosis not present

## 2020-07-24 DIAGNOSIS — E559 Vitamin D deficiency, unspecified: Secondary | ICD-10-CM | POA: Diagnosis not present

## 2020-07-24 DIAGNOSIS — J309 Allergic rhinitis, unspecified: Secondary | ICD-10-CM | POA: Diagnosis not present

## 2020-07-24 DIAGNOSIS — Z0000 Encounter for general adult medical examination without abnormal findings: Secondary | ICD-10-CM | POA: Diagnosis not present

## 2020-07-24 DIAGNOSIS — K219 Gastro-esophageal reflux disease without esophagitis: Secondary | ICD-10-CM | POA: Diagnosis not present

## 2020-07-24 DIAGNOSIS — F419 Anxiety disorder, unspecified: Secondary | ICD-10-CM | POA: Diagnosis not present

## 2020-07-24 DIAGNOSIS — E7800 Pure hypercholesterolemia, unspecified: Secondary | ICD-10-CM | POA: Diagnosis not present

## 2020-07-24 DIAGNOSIS — G2581 Restless legs syndrome: Secondary | ICD-10-CM | POA: Diagnosis not present

## 2020-07-24 DIAGNOSIS — I10 Essential (primary) hypertension: Secondary | ICD-10-CM | POA: Diagnosis not present

## 2020-07-24 DIAGNOSIS — M4690 Unspecified inflammatory spondylopathy, site unspecified: Secondary | ICD-10-CM | POA: Diagnosis not present

## 2020-07-24 DIAGNOSIS — L309 Dermatitis, unspecified: Secondary | ICD-10-CM | POA: Diagnosis not present

## 2020-07-24 DIAGNOSIS — J45909 Unspecified asthma, uncomplicated: Secondary | ICD-10-CM | POA: Diagnosis not present

## 2020-07-30 ENCOUNTER — Ambulatory Visit
Admission: RE | Admit: 2020-07-30 | Discharge: 2020-07-30 | Disposition: A | Discharge status: Home / Self Care | Payer: Medicare Other | Source: Ambulatory Visit | Attending: Endocrinology | Admitting: Endocrinology

## 2020-07-30 DIAGNOSIS — E049 Nontoxic goiter, unspecified: Secondary | ICD-10-CM

## 2020-07-30 DIAGNOSIS — E042 Nontoxic multinodular goiter: Secondary | ICD-10-CM | POA: Diagnosis not present

## 2020-08-05 ENCOUNTER — Other Ambulatory Visit: Payer: Self-pay | Admitting: Family Medicine

## 2020-08-05 DIAGNOSIS — E049 Nontoxic goiter, unspecified: Secondary | ICD-10-CM

## 2020-08-11 DIAGNOSIS — H57813 Brow ptosis, bilateral: Secondary | ICD-10-CM | POA: Diagnosis not present

## 2020-08-11 DIAGNOSIS — H02831 Dermatochalasis of right upper eyelid: Secondary | ICD-10-CM | POA: Diagnosis not present

## 2020-08-11 DIAGNOSIS — H53483 Generalized contraction of visual field, bilateral: Secondary | ICD-10-CM | POA: Diagnosis not present

## 2020-08-11 DIAGNOSIS — H02834 Dermatochalasis of left upper eyelid: Secondary | ICD-10-CM | POA: Diagnosis not present

## 2020-08-11 DIAGNOSIS — H02413 Mechanical ptosis of bilateral eyelids: Secondary | ICD-10-CM | POA: Diagnosis not present

## 2020-08-11 DIAGNOSIS — H02423 Myogenic ptosis of bilateral eyelids: Secondary | ICD-10-CM | POA: Diagnosis not present

## 2020-08-11 DIAGNOSIS — H0279 Other degenerative disorders of eyelid and periocular area: Secondary | ICD-10-CM | POA: Diagnosis not present

## 2020-08-25 DIAGNOSIS — H53482 Generalized contraction of visual field, left eye: Secondary | ICD-10-CM | POA: Diagnosis not present

## 2020-08-25 DIAGNOSIS — H53483 Generalized contraction of visual field, bilateral: Secondary | ICD-10-CM | POA: Diagnosis not present

## 2020-08-25 DIAGNOSIS — H53481 Generalized contraction of visual field, right eye: Secondary | ICD-10-CM | POA: Diagnosis not present

## 2020-08-28 ENCOUNTER — Other Ambulatory Visit (HOSPITAL_COMMUNITY)
Admission: RE | Admit: 2020-08-28 | Discharge: 2020-08-28 | Disposition: A | Discharge status: Home / Self Care | Payer: Medicare Other | Source: Ambulatory Visit | Attending: Student | Admitting: Student

## 2020-08-28 ENCOUNTER — Ambulatory Visit
Admission: RE | Admit: 2020-08-28 | Discharge: 2020-08-28 | Disposition: A | Discharge status: Home / Self Care | Payer: Medicare Other | Source: Ambulatory Visit | Attending: Family Medicine | Admitting: Family Medicine

## 2020-08-28 DIAGNOSIS — E049 Nontoxic goiter, unspecified: Secondary | ICD-10-CM | POA: Insufficient documentation

## 2020-08-28 DIAGNOSIS — E041 Nontoxic single thyroid nodule: Secondary | ICD-10-CM | POA: Diagnosis not present

## 2020-08-29 LAB — CYTOLOGY - NON PAP

## 2020-10-10 DIAGNOSIS — E109 Type 1 diabetes mellitus without complications: Secondary | ICD-10-CM | POA: Diagnosis not present

## 2020-10-10 DIAGNOSIS — E039 Hypothyroidism, unspecified: Secondary | ICD-10-CM | POA: Diagnosis not present

## 2020-10-10 DIAGNOSIS — I471 Supraventricular tachycardia: Secondary | ICD-10-CM | POA: Diagnosis not present

## 2020-10-10 DIAGNOSIS — I10 Essential (primary) hypertension: Secondary | ICD-10-CM | POA: Diagnosis not present

## 2020-10-10 DIAGNOSIS — Z794 Long term (current) use of insulin: Secondary | ICD-10-CM | POA: Diagnosis not present

## 2020-10-10 DIAGNOSIS — M4690 Unspecified inflammatory spondylopathy, site unspecified: Secondary | ICD-10-CM | POA: Diagnosis not present

## 2020-10-10 DIAGNOSIS — Q231 Congenital insufficiency of aortic valve: Secondary | ICD-10-CM | POA: Diagnosis not present

## 2020-10-10 DIAGNOSIS — E049 Nontoxic goiter, unspecified: Secondary | ICD-10-CM | POA: Diagnosis not present

## 2020-10-10 DIAGNOSIS — E7800 Pure hypercholesterolemia, unspecified: Secondary | ICD-10-CM | POA: Diagnosis not present

## 2020-10-10 DIAGNOSIS — Z9641 Presence of insulin pump (external) (internal): Secondary | ICD-10-CM | POA: Diagnosis not present

## 2020-10-10 DIAGNOSIS — J45909 Unspecified asthma, uncomplicated: Secondary | ICD-10-CM | POA: Diagnosis not present

## 2020-10-10 DIAGNOSIS — I350 Nonrheumatic aortic (valve) stenosis: Secondary | ICD-10-CM | POA: Diagnosis not present

## 2021-01-09 DIAGNOSIS — E559 Vitamin D deficiency, unspecified: Secondary | ICD-10-CM | POA: Diagnosis not present

## 2021-01-09 DIAGNOSIS — E7800 Pure hypercholesterolemia, unspecified: Secondary | ICD-10-CM | POA: Diagnosis not present

## 2021-01-09 DIAGNOSIS — E049 Nontoxic goiter, unspecified: Secondary | ICD-10-CM | POA: Diagnosis not present

## 2021-01-09 DIAGNOSIS — E109 Type 1 diabetes mellitus without complications: Secondary | ICD-10-CM | POA: Diagnosis not present

## 2021-01-09 DIAGNOSIS — E039 Hypothyroidism, unspecified: Secondary | ICD-10-CM | POA: Diagnosis not present

## 2021-01-09 DIAGNOSIS — I10 Essential (primary) hypertension: Secondary | ICD-10-CM | POA: Diagnosis not present

## 2021-01-09 DIAGNOSIS — Z9641 Presence of insulin pump (external) (internal): Secondary | ICD-10-CM | POA: Diagnosis not present

## 2021-01-19 DIAGNOSIS — H53483 Generalized contraction of visual field, bilateral: Secondary | ICD-10-CM | POA: Diagnosis not present

## 2021-01-19 DIAGNOSIS — H57813 Brow ptosis, bilateral: Secondary | ICD-10-CM | POA: Diagnosis not present

## 2021-01-19 DIAGNOSIS — H0279 Other degenerative disorders of eyelid and periocular area: Secondary | ICD-10-CM | POA: Diagnosis not present

## 2021-01-19 DIAGNOSIS — H02831 Dermatochalasis of right upper eyelid: Secondary | ICD-10-CM | POA: Diagnosis not present

## 2021-01-19 DIAGNOSIS — H02423 Myogenic ptosis of bilateral eyelids: Secondary | ICD-10-CM | POA: Diagnosis not present

## 2021-01-19 DIAGNOSIS — H02834 Dermatochalasis of left upper eyelid: Secondary | ICD-10-CM | POA: Diagnosis not present

## 2021-01-19 DIAGNOSIS — H02413 Mechanical ptosis of bilateral eyelids: Secondary | ICD-10-CM | POA: Diagnosis not present

## 2021-01-26 ENCOUNTER — Ambulatory Visit: Payer: Medicare Other | Admitting: Adult Health

## 2021-03-02 ENCOUNTER — Ambulatory Visit: Payer: Medicare Other | Admitting: Cardiology

## 2021-03-02 ENCOUNTER — Other Ambulatory Visit: Payer: Self-pay

## 2021-03-02 ENCOUNTER — Encounter: Payer: Self-pay | Admitting: Cardiology

## 2021-03-02 VITALS — BP 128/68 | HR 76 | Temp 98.6°F | Resp 16 | Ht 63.0 in | Wt 179.8 lb

## 2021-03-02 DIAGNOSIS — R0989 Other specified symptoms and signs involving the circulatory and respiratory systems: Secondary | ICD-10-CM

## 2021-03-02 DIAGNOSIS — I471 Supraventricular tachycardia: Secondary | ICD-10-CM | POA: Diagnosis not present

## 2021-03-02 DIAGNOSIS — I350 Nonrheumatic aortic (valve) stenosis: Secondary | ICD-10-CM

## 2021-03-02 DIAGNOSIS — I10 Essential (primary) hypertension: Secondary | ICD-10-CM

## 2021-03-02 NOTE — Progress Notes (Signed)
Primary Physician/Referring:  Janie Morning, DO  Patient ID: [name], female    DOB: 11-17-1950, 70 y.o.   MRN: [id number]  Chief Complaint  Patient presents with   Aortic Stenosis   Follow-up   HPI:    [name]  is a 70 y.o. Caucasian female with hypertension, diet controlled DM, mild obesity, hyperlipidemia, fibromyalgia and PSVT, bicuspid aortic valve with aortic stenosis.  This is her annual visit.  States that she is doing well and except for very mild chronic dyspnea on exertion, denies  chest pain or palpitations.  Past Medical History:  Diagnosis Date   Anxiety    Asthma    Depression    Depression    Diabetes mellitus    Fibromyalgia    GERD (gastroesophageal reflux disease)    Goiter    Hair loss    Hypertension    Menopause    Other and unspecified hyperlipidemia    Positive PPD    Small bowel obstruction ([place]) 02/09/2018   SVT (supraventricular tachycardia) (HCC)    Vasculitis (HCC)    Past Surgical History:  Procedure Laterality Date   APPENDECTOMY     CHOLECYSTECTOMY     PARTIAL HYSTERECTOMY     Family History  Problem Relation Age of Onset   Hepatitis Mother 38       died from [place]   Diabetes Father    Cancer Brother    Crohn's disease Brother    Alcohol abuse Brother     Social History   Tobacco Use   Smoking status: Former    Years: 15.00    Pack years: 0.00    Types: Cigarettes    Quit date: 03/13/1988    Years since quitting: 32.9   Smokeless tobacco: Never  Substance Use Topics   Alcohol use: No   Marital Status: Married  ROS  Review of Systems  Cardiovascular:  Positive for dyspnea on exertion. Negative for leg swelling.  Musculoskeletal:  Positive for arthritis, back pain and myalgias.  Gastrointestinal:  Negative for heartburn and melena.  Objective  Blood pressure 128/68, pulse 76, temperature 98.6 F (37 C), temperature source Temporal, resp. rate 16, height _0  (1.6 m), weight 179 lb 12.8 oz (81.6  kg), SpO2 96 %.  Vitals with BMI 03/02/2021 03/03/2020 01/28/2020  Height _1  _2  _3   Weight 179 lbs 13 oz 179 lbs 173 lbs  BMI 31.86 42.59 56.38  Systolic 756 433 -  Diastolic 68 51 -  Pulse 76 52 -     Physical Exam Constitutional:      General: She is not in acute distress.    Comments: Moderately built and mildly obese.  Most obesity is truncal obesity.  HENT:     Head: Atraumatic.  Neck:     Vascular: No JVD.  Cardiovascular:     Rate and Rhythm: Normal rate and regular rhythm.     Pulses: Intact distal pulses.          Carotid pulses are  on the right side with bruit and  on the left side with bruit.      Dorsalis pedis pulses are 2+ on the right side and 2+ on the left side.       Posterior tibial pulses are 2+ on the right side and 2+ on the left side.     Heart sounds: Murmur heard.  Midsystolic murmur is present with a grade of 3/6 at the upper right  sternal border and apex radiating to the neck.    No gallop.     Comments: No leg edema, no JVD.  Pulmonary:     Effort: Pulmonary effort is normal. No accessory muscle usage or respiratory distress.     Breath sounds: Normal breath sounds.  Abdominal:     General: Bowel sounds are normal.     Palpations: Abdomen is soft.     Comments: Reducible ventral hernia noted.  Obese abdomen.   Skin:    General: Skin is warm.     Capillary Refill: Capillary refill takes less than 2 seconds.  Neurological:     General: No focal deficit present.   Laboratory examination:   No results for input(s): NA, K, CL, CO2, GLUCOSE, BUN, CREATININE, CALCIUM, GFRNONAA, GFRAA in the last 8760 hours. CrCl cannot be calculated (Patient's most recent lab result is older than the maximum 21 days allowed.).  CMP Latest Ref Rng & Units 02/11/2018 02/10/2018 02/09/2018  Glucose 65 - 99 mg/dL 127(H) 146(H) 71  BUN 6 - 20 mg/dL _0 Creatinine 0.44 - 1.00 mg/dL 0.78 0.84 0.91  Sodium 135 - 145 mmol/L 141 139 139  Potassium 3.5 - 5.1 mmol/L  4.4 5.6(H) 4.6  Chloride 101 - 111 mmol/L 108 105 101  CO2 22 - 32 mmol/L _1 Calcium 8.9 - 10.3 mg/dL 8.0(L) 8.7(L) 9.8  Total Protein 6.5 - 8.1 g/dL - - 8.0  Total Bilirubin 0.3 - 1.2 mg/dL - - 0.7  Alkaline Phos 38 - 126 U/L - - 58  AST 15 - 41 U/L - - 21  ALT 14 - 54 U/L - - 15   CBC Latest Ref Rng & Units 02/10/2018 02/09/2018 02/09/2018  WBC 4.0 - 10.5 K/uL 11.0(H) 12.0(H) 13.2(H)  Hemoglobin 12.0 - 15.0 g/dL 13.8 14.9 14.1  Hematocrit 36.0 - 46.0 % 42.8 44.9 41.4  Platelets 150 - 400 K/uL 198 242 267   Lipid Panel     Component Value Date/Time   CHOL 147 01/09/2018 0823   TRIG 252 (H) 01/09/2018 0823   HDL 40 (L) 01/09/2018 0823   CHOLHDL 3.7 01/09/2018 0823   VLDL 28 08/28/2015 1050   LDLCALC 74 01/09/2018 0823   HEMOGLOBIN A1C Lab Results  Component Value Date   HGBA1C 6.7 12/20/2014   External labs:   Labs 08/28/2020:  Hb 14.6/HCT 44.9, platelets 229.  Serum glucose 157 mg, BUN 14, creatinine 1.1, EGFR 51.9.  LFT normal.  A1c 7.1%.TSH normal.  Total cholesterol 106, triglycerides 157, HDL 40, LDL 55.   Labs 07/16/2019:  Hb 14.3/HCT 44.1, platelets 266, indicis normal.  Serum glucose 207 mg, BUN 13, creatinine 1.0, EGFR 58 mL.  Potassium 4.8, sodium 141, CMP normal otherwise.  Total cholesterol 126, triglycerides 157, HDL 40, LDL 55.  Non-HDL cholesterol 86.  TSH normal at 0.77.  Medications and allergies   Allergies  Allergen Reactions   Penicillins Anaphylaxis    Has patient had a PCN reaction causing immediate rash, facial/tongue/throat swelling, SOB or lightheadedness with hypotension: No Has patient had a PCN reaction causing severe rash involving mucus membranes or skin necrosis: No Has patient had a PCN reaction that required hospitalization: Yes/ Has patient had a PCN reaction occurring within the last 10 years: No If all of the above answers are "NO", then may proceed with Cephalosporin use.    Ace Inhibitors     INDUCED  BRONCHOSPASM     Current Outpatient Medications  Medication  Instructions   albuterol (PROVENTIL HFA;VENTOLIN HFA) 108 (90 Base) MCG/ACT inhaler 2 puffs, Inhalation, Every 6 hours PRN   albuterol (PROVENTIL) 2.5 mg, Nebulization, Every 6 hours PRN, ICD10:J45.909   ALPRAZolam (XANAX) 1 MG tablet TAKE ONE-HALF TABLET BY MOUTH ONCE DAILY AS NEEDED FOR STRESS   amLODipine (NORVASC) 10 mg, Oral, Daily   atenolol (TENORMIN) 100 mg, Oral, 2 times daily   atorvastatin (LIPITOR) 40 mg, Oral, Daily   augmented betamethasone dipropionate (DIPROLENE-AF) 0.05 % ointment Topical, 2 times daily   Dextrose, Diabetic Use, (GLUCOSE PO) Oral, As needed   levocetirizine (XYZAL ALLERGY 24HR) 5 mg, Oral, Every evening   levothyroxine (SYNTHROID) 112 mcg, Oral, Daily   losartan-hydrochlorothiazide (HYZAAR) 100-25 MG tablet 1 tablet, Oral, BH-each morning   naproxen sodium (ALEVE) 220 mg, Oral, As needed, Reported on 03/04/2016   omeprazole (PRILOSEC) 20 mg, Oral, Daily before breakfast, Can use as needed   polyethylene glycol powder (GLYCOLAX/MIRALAX) 17 g, Oral, Daily   potassium chloride SA (K-DUR,KLOR-CON) 20 MEQ tablet 20 mEq, Oral, 2 times daily   rOPINIRole (REQUIP) 0.25 MG tablet TAKE 1 TABLET BY MOUTH AT BEDTIME   sertraline (ZOLOFT) 50 mg, Oral, Daily   simethicone (GAS-X) 80 mg, Oral, 4 times daily PRN   Vitamin D 2,000 Units, Oral, Daily   Radiology:   No results found.  Cardiac Studies:   Exercise myoview stress 09/02/2014: 1. The resting electrocardiogram demonstrated normal sinus rhythm, normal resting conduction, no resting arrhythmias and nonspecific ST-T changes. The stress electrocardiogram was normal. The patient performed treadmill exercise using a Bruce protocol, completing 6:45 minutes. The patient completed an estimated workload of 10.2 METS. The blood pressure response to exercise was normal. The stress test was terminated because of fatigue. 2. The overall quality of the study is  good. There is no evidence of abnormal lung activity. Stress and rest SPECT images demonstrate homogeneous tracer distribution throughout the myocardium. Gated SPECT imaging reveals normal myocardial thickening and wall motion. The left ventricular ejection fraction was normal (44%) but visually appears to be normal. This is a low risk study. No significant change from Exercise sestamibi stress test on 05/08/2009.  Sleep study 10/17/2014 (Dohmeier, MD): Mild to moderate obstructive sleep apnea. Effectively treated with CPAP. Compliant  Echocardiogram 12/13/2019:  Normal LV systolic function with EF 56%. Left ventricle cavity is normal in size. Mild concentric hypertrophy of the left ventricle. Normal global wall motion. Doppler evidence of grade I (impaired) diastolic dysfunction, normal LAP.  Likely bicuspid aortic valve with raphe between right and left coronary cusp. Moderate leaflet and annular calcification.  Mild to moderate aortic regurgitation. Moderate to severe aortic stenosis. Aortic valve mean  gradient of 10 mmHg, Vmax of 2.2 m/s. Calculated aortic valve area by continuity equation is 1.7 cm.  Compared to previous study in 06/2020, aortic stenosis severity is down from moderate to mild.  Suboptimal  Doppler window could lead to underestimation of aortic stenosis severity.  Recommend clinical correlation.  Mild (Grade I) mitral regurgitation.  Inadequate TR jet to estimate pulmonary artery systolic pressure. Normal right atrial pressure.  EKG  EKG 03/02/2021: Normal sinus rhythm/sinus bradycardia at rate of 57 bpm, normal axis, incomplete right bundle branch block.  Normal EKG.   No significant change from EKG 03/03/2020  Assessment     ICD-10-CM   1. Moderate to severe aortic stenosis  I35.0 EKG 12-Lead    PCV ECHOCARDIOGRAM COMPLETE    2. SVT (supraventricular tachycardia) (HCC)  I47.1 EKG 12-Lead  3. Primary hypertension  I10 PCV ECHOCARDIOGRAM COMPLETE    4. Bilateral carotid  bruits  R09.89 PCV CAROTID DUPLEX (BILATERAL)      No orders of the defined types were placed in this encounter.   Medications Discontinued During This Encounter  Medication Reason   gabapentin (NEURONTIN) 300 MG capsule Error   aspirin 81 MG tablet Error    Recommendations:   [name]  is a 70 y.o. Caucasian female with hypertension, diet controlled DM, mild obesity, hyperlipidemia, fibromyalgia and PSVT, bicuspid aortic valve with aortic stenosis.  This is her annual visit.  States that she is doing well and except for very mild chronic dyspnea on exertion, denies  chest pain or palpitations.  This is an annual visit, no change in physical exam, she has not had any further palpitations from SVT standpoint and blood pressure is well controlled.  I also reviewed her external labs, lipids are also well controlled.  I discussed regarding weight loss with the patient.  I will repeat echocardiogram to follow-up on aortic stenosis and unless abnormal, I will see her back in a year.  She also has bilateral carotid bruit, this could be conducted sounds from the aortic stenosis however needs carotid duplex to exclude carotid stenosis in view of underlying medical comorbidity.   Adrian Prows, MD, [facility] 03/02/2021, 9:44 PM Office: [phone] Fax: [phone] Pager: [phone]

## 2021-03-25 ENCOUNTER — Ambulatory Visit: Payer: Medicare Other

## 2021-03-25 ENCOUNTER — Other Ambulatory Visit: Payer: Self-pay

## 2021-03-25 DIAGNOSIS — I350 Nonrheumatic aortic (valve) stenosis: Secondary | ICD-10-CM | POA: Diagnosis not present

## 2021-03-25 DIAGNOSIS — I10 Essential (primary) hypertension: Secondary | ICD-10-CM

## 2021-03-25 DIAGNOSIS — R0989 Other specified symptoms and signs involving the circulatory and respiratory systems: Secondary | ICD-10-CM

## 2021-03-25 DIAGNOSIS — I6522 Occlusion and stenosis of left carotid artery: Secondary | ICD-10-CM

## 2021-04-01 NOTE — Progress Notes (Signed)
Ganji : Called and spoke with patient regarding her echocardiogram results.   Front desk staff : Please call patient to schedule FU for results with Dr. Jacinto Halim next available appointment.

## 2021-04-03 NOTE — Progress Notes (Signed)
Attempted to call pt, no answer. Left vm requesting call back. Pt has been set up with a F/U August 4th at 2:15.

## 2021-04-14 DIAGNOSIS — E7801 Familial hypercholesterolemia: Secondary | ICD-10-CM | POA: Diagnosis not present

## 2021-04-14 DIAGNOSIS — E559 Vitamin D deficiency, unspecified: Secondary | ICD-10-CM | POA: Diagnosis not present

## 2021-04-14 DIAGNOSIS — E109 Type 1 diabetes mellitus without complications: Secondary | ICD-10-CM | POA: Diagnosis not present

## 2021-04-14 DIAGNOSIS — E039 Hypothyroidism, unspecified: Secondary | ICD-10-CM | POA: Diagnosis not present

## 2021-04-16 ENCOUNTER — Ambulatory Visit: Payer: Medicare Other | Admitting: Cardiology

## 2021-04-17 ENCOUNTER — Other Ambulatory Visit: Payer: Self-pay

## 2021-04-17 ENCOUNTER — Ambulatory Visit: Payer: Medicare Other | Admitting: Cardiology

## 2021-04-17 ENCOUNTER — Encounter: Payer: Self-pay | Admitting: Cardiology

## 2021-04-17 VITALS — BP 132/75 | HR 55 | Temp 98.9°F | Resp 17 | Ht 63.0 in | Wt 181.2 lb

## 2021-04-17 DIAGNOSIS — I350 Nonrheumatic aortic (valve) stenosis: Secondary | ICD-10-CM

## 2021-04-17 DIAGNOSIS — Q231 Congenital insufficiency of aortic valve: Secondary | ICD-10-CM

## 2021-04-17 DIAGNOSIS — I6522 Occlusion and stenosis of left carotid artery: Secondary | ICD-10-CM | POA: Diagnosis not present

## 2021-04-17 DIAGNOSIS — R0609 Other forms of dyspnea: Secondary | ICD-10-CM | POA: Diagnosis not present

## 2021-04-17 DIAGNOSIS — R0600 Dyspnea, unspecified: Secondary | ICD-10-CM

## 2021-04-17 DIAGNOSIS — E782 Mixed hyperlipidemia: Secondary | ICD-10-CM

## 2021-04-17 NOTE — Progress Notes (Signed)
 Primary Physician/Referring:  Collins, Dana, DO  Patient ID: [name], female    DOB: 05/24/1951, 70 y.o.   MRN: [id number]  Chief Complaint  Patient presents with   Follow-up   Results   HPI:    [name]  is a 70 y.o. Caucasian female with hypertension, diet controlled DM, mild obesity, hyperlipidemia, fibromyalgia and PSVT, bicuspid aortic valve with aortic stenosis.  I had seen her 6 weeks ago and repeated echocardiogram to evaluate dyspnea and aortic stenosis and carotid duplex for carotid bruit.  On further questioning, she has been having worsening symptoms of dyspnea but she thought this was "normal" for her.  No PND or orthopnea.  She has not had any chest pain or palpitations.  Past Medical History:  Diagnosis Date   Anxiety    Asthma    Depression    Depression    Diabetes mellitus    Fibromyalgia    GERD (gastroesophageal reflux disease)    Goiter    Hair loss    Hypertension    Menopause    Other and unspecified hyperlipidemia    Positive PPD    Small bowel obstruction (HCC) 02/09/2018   SVT (supraventricular tachycardia) (HCC)    Vasculitis (HCC)    Past Surgical History:  Procedure Laterality Date   APPENDECTOMY     CHOLECYSTECTOMY     PARTIAL HYSTERECTOMY     Family History  Problem Relation Age of Onset   Hepatitis Mother 41       died from Hep A   Diabetes Father    Cancer Brother    Crohn's disease Brother    Alcohol abuse Brother     Social History   Tobacco Use   Smoking status: Former    Years: 15.00    Types: Cigarettes    Quit date: 03/13/1988    Years since quitting: 33.1   Smokeless tobacco: Never  Substance Use Topics   Alcohol use: No   Marital Status: Married  ROS  Review of Systems  Cardiovascular:  Positive for dyspnea on exertion. Negative for leg swelling.  Musculoskeletal:  Positive for arthritis, back pain and myalgias.  Gastrointestinal:  Negative for heartburn and melena.  Objective  Blood  pressure 132/75, pulse (!) 55, temperature 98.9 F (37.2 C), temperature source Temporal, resp. rate 17, height 5' 3" (1.6 m), weight 181 lb 3.2 oz (82.2 kg), SpO2 95 %.  Vitals with BMI 04/17/2021 03/02/2021 03/03/2020  Height 5' 3" 5' 3" 5' 3"  Weight 181 lbs 3 oz 179 lbs 13 oz 179 lbs  BMI 32.11 31.86 31.72  Systolic 132 128 131  Diastolic 75 68 51  Pulse 55 76 52     Physical Exam Constitutional:      General: She is not in acute distress.    Comments: Moderately built and mildly obese.  Most obesity is truncal obesity.  HENT:     Head: Atraumatic.  Neck:     Vascular: Carotid bruit (bilateral) present. No JVD.  Cardiovascular:     Rate and Rhythm: Normal rate and regular rhythm.     Pulses: Normal pulses and intact distal pulses.     Heart sounds: Murmur heard.  Midsystolic murmur is present with a grade of 3/6 at the upper right sternal border and apex radiating to the neck.    No gallop.  Pulmonary:     Effort: Pulmonary effort is normal. No accessory muscle usage or respiratory distress.       Breath sounds: Normal breath sounds.  Abdominal:     General: Bowel sounds are normal.     Palpations: Abdomen is soft.     Comments: Reducible ventral hernia noted.  Obese abdomen.   Musculoskeletal:     Right lower leg: No edema.     Left lower leg: No edema.  Skin:    General: Skin is warm.     Capillary Refill: Capillary refill takes less than 2 seconds.  Neurological:     General: No focal deficit present.   Laboratory examination:   No results for input(s): NA, K, CL, CO2, GLUCOSE, BUN, CREATININE, CALCIUM, GFRNONAA, GFRAA in the last 8760 hours. CrCl cannot be calculated (Patient's most recent lab result is older than the maximum 21 days allowed.).  CMP Latest Ref Rng & Units 02/11/2018 02/10/2018 02/09/2018  Glucose 65 - 99 mg/dL 127(H) 146(H) 71  BUN 6 - 20 mg/dL 13 14 17  Creatinine 0.44 - 1.00 mg/dL 0.78 0.84 0.91  Sodium 135 - 145 mmol/L 141 139 139  Potassium 3.5 -  5.1 mmol/L 4.4 5.6(H) 4.6  Chloride 101 - 111 mmol/L 108 105 101  CO2 22 - 32 mmol/L 25 23 29  Calcium 8.9 - 10.3 mg/dL 8.0(L) 8.7(L) 9.8  Total Protein 6.5 - 8.1 g/dL - - 8.0  Total Bilirubin 0.3 - 1.2 mg/dL - - 0.7  Alkaline Phos 38 - 126 U/L - - 58  AST 15 - 41 U/L - - 21  ALT 14 - 54 U/L - - 15   CBC Latest Ref Rng & Units 02/10/2018 02/09/2018 02/09/2018  WBC 4.0 - 10.5 K/uL 11.0(H) 12.0(H) 13.2(H)  Hemoglobin 12.0 - 15.0 g/dL 13.8 14.9 14.1  Hematocrit 36.0 - 46.0 % 42.8 44.9 41.4  Platelets 150 - 400 K/uL 198 242 267    External labs:   Labs 04/14/2021:  Sodium 142, potassium 4.2, BUN 13, creatinine 0.93, EGFR 71 mL.  Total cholesterol 147, triglycerides 208, HDL 42, LDL 63.  Non-HDL cholesterol 105.  TSH normal at 1.32.  Vitamin D normal at 35.7.  Labs 08/28/2020:  Hb 14.6/HCT 44.9, platelets 229.  Serum glucose 157 mg, BUN 14, creatinine 1.1, EGFR 51.9.  LFT normal.  A1c 7.1%.TSH normal.  Total cholesterol 106, triglycerides 157, HDL 40, LDL 55.   Labs 07/16/2019:  Hb 14.3/HCT 44.1, platelets 266, indicis normal.  Serum glucose 207 mg, BUN 13, creatinine 1.0, EGFR 58 mL.  Potassium 4.8, sodium 141, CMP normal otherwise.  Total cholesterol 126, triglycerides 157, HDL 40, LDL 55.  Non-HDL cholesterol 86.  TSH normal at 0.77.  Medications and allergies   Allergies  Allergen Reactions   Penicillins Anaphylaxis    Has patient had a PCN reaction causing immediate rash, facial/tongue/throat swelling, SOB or lightheadedness with hypotension: No Has patient had a PCN reaction causing severe rash involving mucus membranes or skin necrosis: No Has patient had a PCN reaction that required hospitalization: Yes/ Has patient had a PCN reaction occurring within the last 10 years: No If all of the above answers are "NO", then may proceed with Cephalosporin use.    Ace Inhibitors     INDUCED BRONCHOSPASM     Current Outpatient Medications  Medication Instructions    albuterol (PROVENTIL HFA;VENTOLIN HFA) 108 (90 Base) MCG/ACT inhaler 2 puffs, Inhalation, Every 6 hours PRN   albuterol (PROVENTIL) 2.5 mg, Nebulization, Every 6 hours PRN, ICD10:J45.909   ALPRAZolam (XANAX) 1 MG tablet TAKE ONE-HALF TABLET BY MOUTH ONCE DAILY AS NEEDED FOR   STRESS   amLODipine (NORVASC) 10 mg, Oral, Daily   atenolol (TENORMIN) 100 mg, Oral, 2 times daily   atorvastatin (LIPITOR) 40 mg, Oral, Daily   augmented betamethasone dipropionate (DIPROLENE-AF) 0.05 % ointment Topical, 2 times daily   Dextrose, Diabetic Use, (GLUCOSE PO) Oral, As needed   levocetirizine (XYZAL ALLERGY 24HR) 5 mg, Oral, Every evening   levothyroxine (SYNTHROID) 112 mcg, Oral, Daily   losartan-hydrochlorothiazide (HYZAAR) 100-25 MG tablet 1 tablet, Oral, BH-each morning   naproxen sodium (ALEVE) 220 mg, Oral, As needed, Reported on 03/04/2016   omeprazole (PRILOSEC) 20 mg, Oral, Daily before breakfast, Can use as needed   polyethylene glycol powder (GLYCOLAX/MIRALAX) 17 g, Oral, As needed   potassium chloride SA (K-DUR,KLOR-CON) 20 MEQ tablet 20 mEq, Oral, 2 times daily   rOPINIRole (REQUIP) 0.25 MG tablet TAKE 1 TABLET BY MOUTH AT BEDTIME   sertraline (ZOLOFT) 50 mg, Oral, Daily   simethicone (GAS-X) 80 mg, Oral, 4 times daily PRN   Vitamin D 2,000 Units, Oral, Daily   Radiology:   No results found.  Cardiac Studies:   Exercise myoview stress 09/02/2014: 1. The resting electrocardiogram demonstrated normal sinus rhythm, normal resting conduction, no resting arrhythmias and nonspecific ST-T changes. The stress electrocardiogram was normal. The patient performed treadmill exercise using a Bruce protocol, completing 6:45 minutes. The patient completed an estimated workload of 10.2 METS. The blood pressure response to exercise was normal. The stress test was terminated because of fatigue. 2. The overall quality of the study is good. There is no evidence of abnormal lung activity. Stress and rest SPECT  images demonstrate homogeneous tracer distribution throughout the myocardium. Gated SPECT imaging reveals normal myocardial thickening and wall motion. The left ventricular ejection fraction was normal (44%) but visually appears to be normal. This is a low risk study. No significant change from Exercise sestamibi stress test on 05/08/2009.  Sleep study 10/17/2014 (Dohmeier, MD): Mild to moderate obstructive sleep apnea. Effectively treated with CPAP. Compliant  PCV ECHOCARDIOGRAM COMPLETE 03/25/2021 Left ventricle cavity is normal in size. Moderate concentric hypertrophy of the left ventricle. Normal global wall motion. Normal LV systolic function with visual EF 55-60%. Doppler evidence of grade II (pseudonormal) diastolic dysfunction, elevated LAP. Bicuspid aortic valve with raphe between left and right coronary cusp. Moderate calcification. Severe aortic stenosis. Vmax 4.0 m/sec. Aortic valve peak gradient of 66.6 mmHg and mean gradient of 41.0 mmHg, aortic valve area 0.6 cm by continuity equation. Mild aortic regurgitation. Mild (Grade I) mitral regurgitation. Mild tricuspid regurgitation. No evidence of pulmonary hypertension. Compared to previous studies in 2020 and 2021, aortic stenosis severity is increased from moderate.  Carotid artery duplex 03/25/2021: No evidence of significant stenosis in the right carotid vessels. Duplex suggests stenosis in the left internal carotid artery (50-69%). Antegrade right vertebral artery flow. Antegrade left vertebral artery flow. Compared to the study done on 05/10/2017, left carotid stenosis is new.  Follow up in six months is appropriate if clinically indicated.  EKG  EKG 03/02/2021: Normal sinus rhythm/sinus bradycardia at rate of 57 bpm, normal axis, incomplete right bundle branch block.  Normal EKG.   No significant change from EKG 03/03/2020  Assessment     ICD-10-CM   1. Bicuspid aortic valve  Q23.1     2. Severe aortic stenosis  I35.0     3.  Dyspnea on exertion  R06.00     4. Asymptomatic stenosis of left carotid artery  I65.22     5. Mixed hyperlipidemia  E78.2         No orders of the defined types were placed in this encounter.   There are no discontinued medications.   Recommendations:   [name]  is a 70 y.o. Caucasian female with hypertension, diet controlled DM, mild obesity, hyperlipidemia, fibromyalgia and PSVT, bicuspid aortic valve with aortic stenosis.  I had seen her 6 weeks ago and repeated echocardiogram to evaluate dyspnea and aortic stenosis and carotid duplex for carotid bruit.  I reviewed the results of the echocardiogram, she now has severe aortic stenosis and on further questioning states that she is markedly dyspneic and has to stop frequently and she thought that this was "normal" for her long standing dyspnea.  Fortunately no clinical evidence of heart failure and she has not had any PND or orthopnea.  After discussions, she is willing to proceed with right and left heart catheterization and is aware that she will need aortic valve replacement.  Reviewed her carotid artery duplex, she has moderate disease in the left.  She will need continued surveillance.  High risk factors including hypertension, hyperlipidemia and diet-controlled diabetes are all well controlled.  I will see him back in 3 months for follow-up, in the interim she probably will have aortic valve replacement.   Iyanla Eilers, MD, FACC 04/19/2021, 6:44 PM Office: [phone] Fax: [phone] Pager: [phone]  

## 2021-04-17 NOTE — H&P (View-Only) (Signed)
Primary Physician/Referring:  Janie Morning, DO  Patient ID: [name], female    DOB: [birth year], 70 y.o.   MRN: [id number]  Chief Complaint  Patient presents with   Follow-up   Results   HPI:    [name]  is a 70 y.o. Caucasian female with hypertension, diet controlled DM, mild obesity, hyperlipidemia, fibromyalgia and PSVT, bicuspid aortic valve with aortic stenosis.  I had seen her 6 weeks ago and repeated echocardiogram to evaluate dyspnea and aortic stenosis and carotid duplex for carotid bruit.  On further questioning, she has been having worsening symptoms of dyspnea but she thought this was "normal" for her.  No PND or orthopnea.  She has not had any chest pain or palpitations.  Past Medical History:  Diagnosis Date   Anxiety    Asthma    Depression    Depression    Diabetes mellitus    Fibromyalgia    GERD (gastroesophageal reflux disease)    Goiter    Hair loss    Hypertension    Menopause    Other and unspecified hyperlipidemia    Positive PPD    Small bowel obstruction ([place]) 02/09/2018   SVT (supraventricular tachycardia) (HCC)    Vasculitis (HCC)    Past Surgical History:  Procedure Laterality Date   APPENDECTOMY     CHOLECYSTECTOMY     PARTIAL HYSTERECTOMY     Family History  Problem Relation Age of Onset   Hepatitis Mother 77       died from Hep A   Diabetes Father    Cancer Brother    Crohn's disease Brother    Alcohol abuse Brother     Social History   Tobacco Use   Smoking status: Former    Years: 15.00    Types: Cigarettes    Quit date: 03/13/1988    Years since quitting: 33.1   Smokeless tobacco: Never  Substance Use Topics   Alcohol use: No   Marital Status: Married  ROS  Review of Systems  Cardiovascular:  Positive for dyspnea on exertion. Negative for leg swelling.  Musculoskeletal:  Positive for arthritis, back pain and myalgias.  Gastrointestinal:  Negative for heartburn and melena.  Objective  Blood  pressure 132/75, pulse (!) 55, temperature 98.9 F (37.2 C), temperature source Temporal, resp. rate 17, height _0  (1.6 m), weight 181 lb 3.2 oz (82.2 kg), SpO2 95 %.  Vitals with BMI 04/17/2021 03/02/2021 03/03/2020  Height _1  _2  _3   Weight 181 lbs 3 oz 179 lbs 13 oz 179 lbs  BMI 32.11 40.98 11.91  Systolic 478 295 621  Diastolic 75 68 51  Pulse 55 76 52     Physical Exam Constitutional:      General: She is not in acute distress.    Comments: Moderately built and mildly obese.  Most obesity is truncal obesity.  HENT:     Head: Atraumatic.  Neck:     Vascular: Carotid bruit (bilateral) present. No JVD.  Cardiovascular:     Rate and Rhythm: Normal rate and regular rhythm.     Pulses: Normal pulses and intact distal pulses.     Heart sounds: Murmur heard.  Midsystolic murmur is present with a grade of 3/6 at the upper right sternal border and apex radiating to the neck.    No gallop.  Pulmonary:     Effort: Pulmonary effort is normal. No accessory muscle usage or respiratory distress.  Breath sounds: Normal breath sounds.  Abdominal:     General: Bowel sounds are normal.     Palpations: Abdomen is soft.     Comments: Reducible ventral hernia noted.  Obese abdomen.   Musculoskeletal:     Right lower leg: No edema.     Left lower leg: No edema.  Skin:    General: Skin is warm.     Capillary Refill: Capillary refill takes less than 2 seconds.  Neurological:     General: No focal deficit present.   Laboratory examination:   No results for input(s): NA, K, CL, CO2, GLUCOSE, BUN, CREATININE, CALCIUM, GFRNONAA, GFRAA in the last 8760 hours. CrCl cannot be calculated (Patient's most recent lab result is older than the maximum 21 days allowed.).  CMP Latest Ref Rng & Units 02/11/2018 02/10/2018 02/09/2018  Glucose 65 - 99 mg/dL 127(H) 146(H) 71  BUN 6 - 20 mg/dL _0 Creatinine 0.44 - 1.00 mg/dL 0.78 0.84 0.91  Sodium 135 - 145 mmol/L 141 139 139  Potassium 3.5 -  5.1 mmol/L 4.4 5.6(H) 4.6  Chloride 101 - 111 mmol/L 108 105 101  CO2 22 - 32 mmol/L _1 Calcium 8.9 - 10.3 mg/dL 8.0(L) 8.7(L) 9.8  Total Protein 6.5 - 8.1 g/dL - - 8.0  Total Bilirubin 0.3 - 1.2 mg/dL - - 0.7  Alkaline Phos 38 - 126 U/L - - 58  AST 15 - 41 U/L - - 21  ALT 14 - 54 U/L - - 15   CBC Latest Ref Rng & Units 02/10/2018 02/09/2018 02/09/2018  WBC 4.0 - 10.5 K/uL 11.0(H) 12.0(H) 13.2(H)  Hemoglobin 12.0 - 15.0 g/dL 13.8 14.9 14.1  Hematocrit 36.0 - 46.0 % 42.8 44.9 41.4  Platelets 150 - 400 K/uL 198 242 267    External labs:   Labs 04/14/2021:  Sodium 142, potassium 4.2, BUN 13, creatinine 0.93, EGFR 71 mL.  Total cholesterol 147, triglycerides 208, HDL 42, LDL 63.  Non-HDL cholesterol 105.  TSH normal at 1.32.  Vitamin D normal at 35.7.  Labs 08/28/2020:  Hb 14.6/HCT 44.9, platelets 229.  Serum glucose 157 mg, BUN 14, creatinine 1.1, EGFR 51.9.  LFT normal.  A1c 7.1%.TSH normal.  Total cholesterol 106, triglycerides 157, HDL 40, LDL 55.   Labs 07/16/2019:  Hb 14.3/HCT 44.1, platelets 266, indicis normal.  Serum glucose 207 mg, BUN 13, creatinine 1.0, EGFR 58 mL.  Potassium 4.8, sodium 141, CMP normal otherwise.  Total cholesterol 126, triglycerides 157, HDL 40, LDL 55.  Non-HDL cholesterol 86.  TSH normal at 0.77.  Medications and allergies   Allergies  Allergen Reactions   Penicillins Anaphylaxis    Has patient had a PCN reaction causing immediate rash, facial/tongue/throat swelling, SOB or lightheadedness with hypotension: No Has patient had a PCN reaction causing severe rash involving mucus membranes or skin necrosis: No Has patient had a PCN reaction that required hospitalization: Yes/ Has patient had a PCN reaction occurring within the last 10 years: No If all of the above answers are "NO", then may proceed with Cephalosporin use.    Ace Inhibitors     INDUCED BRONCHOSPASM     Current Outpatient Medications  Medication Instructions    albuterol (PROVENTIL HFA;VENTOLIN HFA) 108 (90 Base) MCG/ACT inhaler 2 puffs, Inhalation, Every 6 hours PRN   albuterol (PROVENTIL) 2.5 mg, Nebulization, Every 6 hours PRN, ICD10:J45.909   ALPRAZolam (XANAX) 1 MG tablet TAKE ONE-HALF TABLET BY MOUTH ONCE DAILY AS NEEDED FOR  STRESS   amLODipine (NORVASC) 10 mg, Oral, Daily   atenolol (TENORMIN) 100 mg, Oral, 2 times daily   atorvastatin (LIPITOR) 40 mg, Oral, Daily   augmented betamethasone dipropionate (DIPROLENE-AF) 0.05 % ointment Topical, 2 times daily   Dextrose, Diabetic Use, (GLUCOSE PO) Oral, As needed   levocetirizine (XYZAL ALLERGY 24HR) 5 mg, Oral, Every evening   levothyroxine (SYNTHROID) 112 mcg, Oral, Daily   losartan-hydrochlorothiazide (HYZAAR) 100-25 MG tablet 1 tablet, Oral, BH-each morning   naproxen sodium (ALEVE) 220 mg, Oral, As needed, Reported on 03/04/2016   omeprazole (PRILOSEC) 20 mg, Oral, Daily before breakfast, Can use as needed   polyethylene glycol powder (GLYCOLAX/MIRALAX) 17 g, Oral, As needed   potassium chloride SA (K-DUR,KLOR-CON) 20 MEQ tablet 20 mEq, Oral, 2 times daily   rOPINIRole (REQUIP) 0.25 MG tablet TAKE 1 TABLET BY MOUTH AT BEDTIME   sertraline (ZOLOFT) 50 mg, Oral, Daily   simethicone (GAS-X) 80 mg, Oral, 4 times daily PRN   Vitamin D 2,000 Units, Oral, Daily   Radiology:   No results found.  Cardiac Studies:   Exercise myoview stress 09/02/2014: 1. The resting electrocardiogram demonstrated normal sinus rhythm, normal resting conduction, no resting arrhythmias and nonspecific ST-T changes. The stress electrocardiogram was normal. The patient performed treadmill exercise using a Bruce protocol, completing 6:45 minutes. The patient completed an estimated workload of 10.2 METS. The blood pressure response to exercise was normal. The stress test was terminated because of fatigue. 2. The overall quality of the study is good. There is no evidence of abnormal lung activity. Stress and rest SPECT  images demonstrate homogeneous tracer distribution throughout the myocardium. Gated SPECT imaging reveals normal myocardial thickening and wall motion. The left ventricular ejection fraction was normal (44%) but visually appears to be normal. This is a low risk study. No significant change from Exercise sestamibi stress test on 05/08/2009.  Sleep study 10/17/2014 (Dohmeier, MD): Mild to moderate obstructive sleep apnea. Effectively treated with CPAP. Compliant  PCV ECHOCARDIOGRAM COMPLETE 03/25/2021 Left ventricle cavity is normal in size. Moderate concentric hypertrophy of the left ventricle. Normal global wall motion. Normal LV systolic function with visual EF 55-60%. Doppler evidence of grade II (pseudonormal) diastolic dysfunction, elevated LAP. Bicuspid aortic valve with raphe between left and right coronary cusp. Moderate calcification. Severe aortic stenosis. Vmax 4.0 m/sec. Aortic valve peak gradient of 66.6 mmHg and mean gradient of 41.0 mmHg, aortic valve area 0.6 cm by continuity equation. Mild aortic regurgitation. Mild (Grade I) mitral regurgitation. Mild tricuspid regurgitation. No evidence of pulmonary hypertension. Compared to previous studies in 2020 and 2021, aortic stenosis severity is increased from moderate.  Carotid artery duplex 03/25/2021: No evidence of significant stenosis in the right carotid vessels. Duplex suggests stenosis in the left internal carotid artery (50-69%). Antegrade right vertebral artery flow. Antegrade left vertebral artery flow. Compared to the study done on 05/10/2017, left carotid stenosis is new.  Follow up in six months is appropriate if clinically indicated.  EKG  EKG 03/02/2021: Normal sinus rhythm/sinus bradycardia at rate of 57 bpm, normal axis, incomplete right bundle branch block.  Normal EKG.   No significant change from EKG 03/03/2020  Assessment     ICD-10-CM   1. Bicuspid aortic valve  Q23.1     2. Severe aortic stenosis  I35.0     3.  Dyspnea on exertion  R06.00     4. Asymptomatic stenosis of left carotid artery  I65.22     5. Mixed hyperlipidemia  E78.2  No orders of the defined types were placed in this encounter.   There are no discontinued medications.   Recommendations:   [name]  is a 70 y.o. Caucasian female with hypertension, diet controlled DM, mild obesity, hyperlipidemia, fibromyalgia and PSVT, bicuspid aortic valve with aortic stenosis.  I had seen her 6 weeks ago and repeated echocardiogram to evaluate dyspnea and aortic stenosis and carotid duplex for carotid bruit.  I reviewed the results of the echocardiogram, she now has severe aortic stenosis and on further questioning states that she is markedly dyspneic and has to stop frequently and she thought that this was "normal" for her long standing dyspnea.  Fortunately no clinical evidence of heart failure and she has not had any PND or orthopnea.  After discussions, she is willing to proceed with right and left heart catheterization and is aware that she will need aortic valve replacement.  Reviewed her carotid artery duplex, she has moderate disease in the left.  She will need continued surveillance.  High risk factors including hypertension, hyperlipidemia and diet-controlled diabetes are all well controlled.  I will see him back in 3 months for follow-up, in the interim she probably will have aortic valve replacement.   Adrian Prows, MD, [facility] 04/19/2021, 6:44 PM Office: [phone] Fax: [phone] Pager: [phone]

## 2021-04-21 DIAGNOSIS — E049 Nontoxic goiter, unspecified: Secondary | ICD-10-CM | POA: Diagnosis not present

## 2021-04-21 DIAGNOSIS — E559 Vitamin D deficiency, unspecified: Secondary | ICD-10-CM | POA: Diagnosis not present

## 2021-04-21 DIAGNOSIS — E039 Hypothyroidism, unspecified: Secondary | ICD-10-CM | POA: Diagnosis not present

## 2021-04-21 DIAGNOSIS — I10 Essential (primary) hypertension: Secondary | ICD-10-CM | POA: Diagnosis not present

## 2021-04-21 DIAGNOSIS — E1065 Type 1 diabetes mellitus with hyperglycemia: Secondary | ICD-10-CM | POA: Diagnosis not present

## 2021-04-21 DIAGNOSIS — L309 Dermatitis, unspecified: Secondary | ICD-10-CM | POA: Diagnosis not present

## 2021-04-21 DIAGNOSIS — Z794 Long term (current) use of insulin: Secondary | ICD-10-CM | POA: Diagnosis not present

## 2021-04-21 DIAGNOSIS — E7800 Pure hypercholesterolemia, unspecified: Secondary | ICD-10-CM | POA: Diagnosis not present

## 2021-04-21 DIAGNOSIS — Z9641 Presence of insulin pump (external) (internal): Secondary | ICD-10-CM | POA: Diagnosis not present

## 2021-04-27 DIAGNOSIS — I350 Nonrheumatic aortic (valve) stenosis: Secondary | ICD-10-CM

## 2021-04-28 ENCOUNTER — Encounter: Payer: Self-pay | Admitting: *Deleted

## 2021-04-28 ENCOUNTER — Ambulatory Visit (HOSPITAL_COMMUNITY)
Admission: RE | Admit: 2021-04-28 | Discharge: 2021-04-28 | Disposition: A | Discharge status: Home / Self Care | Payer: Medicare Other | Attending: Cardiology | Admitting: Cardiology

## 2021-04-28 ENCOUNTER — Encounter (HOSPITAL_COMMUNITY)
Admission: RE | Disposition: A | Discharge status: Home / Self Care | Payer: Self-pay | Source: Home / Self Care | Attending: Cardiology

## 2021-04-28 ENCOUNTER — Encounter (HOSPITAL_COMMUNITY): Payer: Self-pay | Admitting: Cardiology

## 2021-04-28 ENCOUNTER — Other Ambulatory Visit: Payer: Self-pay

## 2021-04-28 ENCOUNTER — Telehealth: Payer: Self-pay | Admitting: Cardiology

## 2021-04-28 DIAGNOSIS — M797 Fibromyalgia: Secondary | ICD-10-CM | POA: Insufficient documentation

## 2021-04-28 DIAGNOSIS — R0609 Other forms of dyspnea: Secondary | ICD-10-CM

## 2021-04-28 DIAGNOSIS — Z006 Encounter for examination for normal comparison and control in clinical research program: Secondary | ICD-10-CM

## 2021-04-28 DIAGNOSIS — Z6831 Body mass index (BMI) 31.0-31.9, adult: Secondary | ICD-10-CM | POA: Insufficient documentation

## 2021-04-28 DIAGNOSIS — E669 Obesity, unspecified: Secondary | ICD-10-CM | POA: Diagnosis not present

## 2021-04-28 DIAGNOSIS — R0600 Dyspnea, unspecified: Secondary | ICD-10-CM

## 2021-04-28 DIAGNOSIS — I350 Nonrheumatic aortic (valve) stenosis: Secondary | ICD-10-CM | POA: Diagnosis not present

## 2021-04-28 DIAGNOSIS — E782 Mixed hyperlipidemia: Secondary | ICD-10-CM | POA: Insufficient documentation

## 2021-04-28 DIAGNOSIS — I10 Essential (primary) hypertension: Secondary | ICD-10-CM | POA: Diagnosis not present

## 2021-04-28 DIAGNOSIS — Z79890 Hormone replacement therapy (postmenopausal): Secondary | ICD-10-CM | POA: Insufficient documentation

## 2021-04-28 DIAGNOSIS — Z79899 Other long term (current) drug therapy: Secondary | ICD-10-CM | POA: Diagnosis not present

## 2021-04-28 DIAGNOSIS — I6522 Occlusion and stenosis of left carotid artery: Secondary | ICD-10-CM | POA: Diagnosis not present

## 2021-04-28 DIAGNOSIS — E119 Type 2 diabetes mellitus without complications: Secondary | ICD-10-CM | POA: Diagnosis not present

## 2021-04-28 DIAGNOSIS — Z888 Allergy status to other drugs, medicaments and biological substances status: Secondary | ICD-10-CM | POA: Insufficient documentation

## 2021-04-28 DIAGNOSIS — Z880 Allergy status to penicillin: Secondary | ICD-10-CM | POA: Insufficient documentation

## 2021-04-28 HISTORY — PX: RIGHT/LEFT HEART CATH AND CORONARY ANGIOGRAPHY: CATH118266

## 2021-04-28 LAB — POCT I-STAT EG7
Acid-Base Excess: 1 mmol/L (ref 0.0–2.0)
Acid-Base Excess: 1 mmol/L (ref 0.0–2.0)
Bicarbonate: 28.5 mmol/L — ABNORMAL HIGH (ref 20.0–28.0)
Bicarbonate: 28.6 mmol/L — ABNORMAL HIGH (ref 20.0–28.0)
Calcium, Ion: 1.31 mmol/L (ref 1.15–1.40)
Calcium, Ion: 1.32 mmol/L (ref 1.15–1.40)
HCT: 40 % (ref 36.0–46.0)
HCT: 40 % (ref 36.0–46.0)
Hemoglobin: 13.6 g/dL (ref 12.0–15.0)
Hemoglobin: 13.6 g/dL (ref 12.0–15.0)
O2 Saturation: 73 %
O2 Saturation: 74 %
Potassium: 3.6 mmol/L (ref 3.5–5.1)
Potassium: 3.6 mmol/L (ref 3.5–5.1)
Sodium: 142 mmol/L (ref 135–145)
Sodium: 143 mmol/L (ref 135–145)
TCO2: 30 mmol/L (ref 22–32)
TCO2: 30 mmol/L (ref 22–32)
pCO2, Ven: 57.3 mmHg (ref 44.0–60.0)
pCO2, Ven: 58.2 mmHg (ref 44.0–60.0)
pH, Ven: 7.297 (ref 7.250–7.430)
pH, Ven: 7.307 (ref 7.250–7.430)
pO2, Ven: 43 mmHg (ref 32.0–45.0)
pO2, Ven: 45 mmHg (ref 32.0–45.0)

## 2021-04-28 LAB — POCT I-STAT 7, (LYTES, BLD GAS, ICA,H+H)
Acid-Base Excess: 2 mmol/L (ref 0.0–2.0)
Bicarbonate: 28.3 mmol/L — ABNORMAL HIGH (ref 20.0–28.0)
Calcium, Ion: 1.25 mmol/L (ref 1.15–1.40)
HCT: 38 % (ref 36.0–46.0)
Hemoglobin: 12.9 g/dL (ref 12.0–15.0)
O2 Saturation: 99 %
Potassium: 3.7 mmol/L (ref 3.5–5.1)
Sodium: 142 mmol/L (ref 135–145)
TCO2: 30 mmol/L (ref 22–32)
pCO2 arterial: 50.6 mmHg — ABNORMAL HIGH (ref 32.0–48.0)
pH, Arterial: 7.355 (ref 7.350–7.450)
pO2, Arterial: 149 mmHg — ABNORMAL HIGH (ref 83.0–108.0)

## 2021-04-28 LAB — GLUCOSE, CAPILLARY: Glucose-Capillary: 114 mg/dL — ABNORMAL HIGH (ref 70–99)

## 2021-04-28 LAB — CBC
HCT: 43.2 % (ref 36.0–46.0)
Hemoglobin: 13.8 g/dL (ref 12.0–15.0)
MCH: 27.6 pg (ref 26.0–34.0)
MCHC: 31.9 g/dL (ref 30.0–36.0)
MCV: 86.4 fL (ref 80.0–100.0)
Platelets: 204 10*3/uL (ref 150–400)
RBC: 5 MIL/uL (ref 3.87–5.11)
RDW: 14.3 % (ref 11.5–15.5)
WBC: 7.5 10*3/uL (ref 4.0–10.5)
nRBC: 0 % (ref 0.0–0.2)

## 2021-04-28 SURGERY — RIGHT/LEFT HEART CATH AND CORONARY ANGIOGRAPHY
Anesthesia: LOCAL

## 2021-04-28 MED ORDER — IOHEXOL 350 MG/ML SOLN
INTRAVENOUS | Status: DC | PRN
  Administered 2021-04-28: 35 mL

## 2021-04-28 MED ORDER — HEPARIN (PORCINE) IN NACL 1000-0.9 UT/500ML-% IV SOLN
INTRAVENOUS | Status: DC | PRN
  Administered 2021-04-28 (×2): 500 mL

## 2021-04-28 MED ORDER — SODIUM CHLORIDE 0.9 % IV SOLN: 250.0000 mL | INTRAVENOUS | Status: DC | PRN

## 2021-04-28 MED ORDER — SODIUM CHLORIDE 0.9 % WEIGHT BASED INFUSION: 1.0000 mL/kg/h | INTRAVENOUS | Status: DC

## 2021-04-28 MED ORDER — SODIUM CHLORIDE 0.9% FLUSH: 3.0000 mL | INTRAVENOUS | Status: DC | PRN

## 2021-04-28 MED ORDER — AMLODIPINE BESYLATE 10 MG PO TABS: 5.0000 mg | ORAL_TABLET | Freq: Every day | ORAL | 3 refills | Status: DC

## 2021-04-28 MED ORDER — ASPIRIN 81 MG PO CHEW
81.0000 mg | CHEWABLE_TABLET | ORAL | Status: AC
  Administered 2021-04-28: 81 mg via ORAL
  Filled 2021-04-28: qty 1

## 2021-04-28 MED ORDER — FENTANYL CITRATE (PF) 100 MCG/2ML IJ SOLN
INTRAMUSCULAR | Status: DC | PRN
  Administered 2021-04-28: 25 ug via INTRAVENOUS

## 2021-04-28 MED ORDER — VERAPAMIL HCL 2.5 MG/ML IV SOLN
INTRAVENOUS | Status: AC
  Filled 2021-04-28: qty 2

## 2021-04-28 MED ORDER — VERAPAMIL HCL 2.5 MG/ML IV SOLN
INTRAVENOUS | Status: DC | PRN
  Administered 2021-04-28: 5 mL via INTRA_ARTERIAL

## 2021-04-28 MED ORDER — LIDOCAINE HCL (PF) 1 % IJ SOLN
INTRAMUSCULAR | Status: DC | PRN
  Administered 2021-04-28 (×2): 2 mL

## 2021-04-28 MED ORDER — SODIUM CHLORIDE 0.9 % WEIGHT BASED INFUSION
3.0000 mL/kg/h | INTRAVENOUS | Status: AC
  Administered 2021-04-28: 3 mL/kg/h via INTRAVENOUS

## 2021-04-28 MED ORDER — ASPIRIN 81 MG PO CHEW: 81.0000 mg | CHEWABLE_TABLET | ORAL | Status: DC

## 2021-04-28 MED ORDER — HEPARIN SODIUM (PORCINE) 1000 UNIT/ML IJ SOLN
INTRAMUSCULAR | Status: DC | PRN
  Administered 2021-04-28: 4000 [IU] via INTRAVENOUS

## 2021-04-28 MED ORDER — HEPARIN (PORCINE) IN NACL 1000-0.9 UT/500ML-% IV SOLN
INTRAVENOUS | Status: AC
  Filled 2021-04-28: qty 500

## 2021-04-28 MED ORDER — SODIUM CHLORIDE 0.9% FLUSH: 3.0000 mL | Freq: Two times a day (BID) | INTRAVENOUS | Status: DC

## 2021-04-28 MED ORDER — MIDAZOLAM HCL 2 MG/2ML IJ SOLN
INTRAMUSCULAR | Status: AC
  Filled 2021-04-28: qty 2

## 2021-04-28 MED ORDER — ACETAMINOPHEN 325 MG PO TABS: 650.0000 mg | ORAL_TABLET | ORAL | Status: DC | PRN

## 2021-04-28 MED ORDER — HEPARIN SODIUM (PORCINE) 1000 UNIT/ML IJ SOLN
INTRAMUSCULAR | Status: AC
  Filled 2021-04-28: qty 1

## 2021-04-28 MED ORDER — ONDANSETRON HCL 4 MG/2ML IJ SOLN: 4.0000 mg | Freq: Four times a day (QID) | INTRAMUSCULAR | Status: DC | PRN

## 2021-04-28 MED ORDER — LIDOCAINE HCL (PF) 1 % IJ SOLN
INTRAMUSCULAR | Status: AC
  Filled 2021-04-28: qty 30

## 2021-04-28 MED ORDER — MIDAZOLAM HCL 2 MG/2ML IJ SOLN
INTRAMUSCULAR | Status: DC | PRN
  Administered 2021-04-28 (×2): 1 mg via INTRAVENOUS

## 2021-04-28 MED ORDER — FENTANYL CITRATE (PF) 100 MCG/2ML IJ SOLN
INTRAMUSCULAR | Status: AC
  Filled 2021-04-28: qty 2

## 2021-04-28 SURGICAL SUPPLY — 14 items
CATH BALLN WEDGE 5F 110CM (CATHETERS) ×1 IMPLANT
CATH OPTITORQUE TIG 4.0 5F (CATHETERS) ×1 IMPLANT
DEVICE RAD COMP TR BAND LRG (VASCULAR PRODUCTS) ×1 IMPLANT
GLIDESHEATH SLEND SS 6F .021 (SHEATH) ×1 IMPLANT
GUIDEWIRE .025 260CM (WIRE) ×1 IMPLANT
GUIDEWIRE INQWIRE 1.5J.035X260 (WIRE) IMPLANT
INQWIRE 1.5J .035X260CM (WIRE) ×2
KIT HEART LEFT (KITS) ×2 IMPLANT
PACK CARDIAC CATHETERIZATION (CUSTOM PROCEDURE TRAY) ×2 IMPLANT
SHEATH GLIDE SLENDER 4/5FR (SHEATH) ×1 IMPLANT
SHEATH PROBE COVER 6X72 (BAG) ×1 IMPLANT
TRANSDUCER W/STOPCOCK (MISCELLANEOUS) ×2 IMPLANT
TUBING CIL FLEX 10 FLL-RA (TUBING) ×2 IMPLANT
WIRE HI TORQ VERSACORE-J 145CM (WIRE) ×1 IMPLANT

## 2021-04-28 NOTE — Interval H&P Note (Signed)
History and Physical Interval Note:  04/28/2021 7:48 AM  [name]  has presented today for surgery, with the diagnosis of chest pain.  The various methods of treatment have been discussed with the patient and family. After consideration of risks, benefits and other options for treatment, the patient has consented to  Procedure(s): RIGHT/LEFT HEART CATH AND CORONARY ANGIOGRAPHY (N/A) as a surgical intervention.  The patient's history has been reviewed, patient examined, no change in status, stable for surgery.  I have reviewed the patient's chart and labs.  Questions were answered to the patient's satisfaction.     Yates Decamp

## 2021-04-28 NOTE — Research (Signed)
Identify Informed Consent   Subject Name: [name]  Subject met inclusion and exclusion criteria.  The informed consent form, study requirements and expectations were reviewed with the subject and questions and concerns were addressed prior to the signing of the consent form.  The subject verbalized understanding of the trial requirements.  The subject agreed to participate in the Identify trial and signed the informed consent at [phone] on 28-Apr-2021.  The informed consent was obtained prior to performance of any protocol-specific procedures for the subject.  A copy of the signed informed consent was given to the subject and a copy was placed in the subject's medical record.   Jasmine Pang, RN BSN [facility] Research & Education Direct Line: [phone]

## 2021-04-29 ENCOUNTER — Other Ambulatory Visit: Payer: Self-pay

## 2021-04-29 ENCOUNTER — Encounter: Payer: Self-pay | Admitting: *Deleted

## 2021-04-29 DIAGNOSIS — I350 Nonrheumatic aortic (valve) stenosis: Secondary | ICD-10-CM

## 2021-04-29 NOTE — Telephone Encounter (Signed)
Pt scheduled for structural heart evaluation on 8/18 with Dr Excell Seltzer.  Pt aware of appointment.

## 2021-04-30 ENCOUNTER — Other Ambulatory Visit: Payer: Self-pay

## 2021-04-30 ENCOUNTER — Ambulatory Visit (INDEPENDENT_AMBULATORY_CARE_PROVIDER_SITE_OTHER): Payer: Medicare Other | Admitting: Cardiovascular Disease

## 2021-04-30 ENCOUNTER — Encounter: Payer: Self-pay | Admitting: Cardiovascular Disease

## 2021-04-30 VITALS — BP 138/60 | HR 64 | Ht 63.0 in | Wt 176.4 lb

## 2021-04-30 DIAGNOSIS — I350 Nonrheumatic aortic (valve) stenosis: Secondary | ICD-10-CM

## 2021-04-30 DIAGNOSIS — I6522 Occlusion and stenosis of left carotid artery: Secondary | ICD-10-CM | POA: Diagnosis not present

## 2021-04-30 NOTE — Patient Instructions (Signed)
Please see letter for instructions for test and follow up.

## 2021-04-30 NOTE — Progress Notes (Signed)
[facility]   MULTIDISCIPLINARY HEART VALVE TEAM  Date:  04/30/2021   ID:  [name], DOB 23-Apr-1951, MRN [id number]  PCP:  Irena Reichmannollins, Dana, DO   Chief Complaint  Patient presents with   Shortness of Breath     HISTORY OF PRESENT ILLNESS: Ashley MalesKathryn B [name] is a 70 y.o. female who presents for evaluation of severe aortic stenosis, referred by Dr Jacinto HalimGanji.  The patient has known of a heart murmur for many years, first discovered by Dr Cleta Albertsaub. She was followed by Dr Clarene DukeLittle in the past and has been seen by Dr Jacinto HalimGanji now for many years. The patient is here alone today. She has had close follow-up with echo surveillance over the years. On her most recent echo study, progression of aortic stenosis is demonstrated, now in the severe range.   The patient reports progressive dyspnea with exertion over the past 6 months. She has attributed this to asthma but hasn't had improvement in symptoms with use of an inhaler. She also reports a feeling of pressure in her chest when lying supine. She also experiences chest tightness with physical activity. She denies lightheadedness or syncope. She complains of fatigue and bendopnea. Shortness of breath occurs with low-level activity such as walking less than one block, emptying the dishwasher, or climbing one flight of stairs.   The patient has had asthma for many years. She has used rescue inhalers and a nebulizer at times. Other limitations include lower back pain from 'facet disease.' The patient has had diabetes for at least 20 years and has used an insulin pump for the last 10 years. Past surgical history includes only a hysterectomy and cholecystectomy. She is married with 2 biologic children and 2 adopted children. She leads an active lifestyle and serves in several roles in her church. She also takes care of her 72 year-old great granddaughter 3 days per week.   The patient has had regular dental care and reports an upper partial with no  current problems.   Past Medical History:  Diagnosis Date   Anxiety    Asthma    Depression    Depression    Diabetes mellitus    Fibromyalgia    GERD (gastroesophageal reflux disease)    Goiter    Hair loss    Hypertension    Menopause    Other and unspecified hyperlipidemia    Positive PPD    Small bowel obstruction (HCC) 02/09/2018   SVT (supraventricular tachycardia) (HCC)    Vasculitis (HCC)     Current Outpatient Medications  Medication Sig Dispense Refill   albuterol (PROVENTIL HFA;VENTOLIN HFA) 108 (90 Base) MCG/ACT inhaler Inhale 2 puffs into the lungs every 6 (six) hours as needed. 8.5 g 11   albuterol (PROVENTIL) (2.5 MG/3ML) 0.083% nebulizer solution Take 3 mLs (2.5 mg total) by nebulization every 6 (six) hours as needed. ICD10:J45.909 75 mL 1   ALPRAZolam (XANAX) 1 MG tablet TAKE ONE-HALF TABLET BY MOUTH ONCE DAILY AS NEEDED FOR STRESS (Patient taking differently: Take 0.5-1 mg by mouth daily as needed for anxiety.) 90 tablet 0   amLODipine (NORVASC) 10 MG tablet Take 0.5 tablets (5 mg total) by mouth daily. 90 tablet 3   atenolol (TENORMIN) 100 MG tablet Take 1 tablet (100 mg total) by mouth 2 (two) times daily. 180 tablet 3   atorvastatin (LIPITOR) 40 MG tablet Take 1 tablet (40 mg total) daily by mouth. 90 tablet 3   augmented betamethasone dipropionate (DIPROLENE-AF) 0.05 % cream  Apply 1 application topically 2 (two) times daily as needed (eczema).     Biotin 61607 MCG TABS Take 10,000 mcg by mouth daily.     carboxymethylcellulose (REFRESH PLUS) 0.5 % SOLN Place 1 drop into both eyes 3 (three) times daily as needed (dry eyes).     Cholecalciferol (VITAMIN D) 2000 units CAPS Take 1 capsule (2,000 Units total) by mouth daily. 30 capsule 3   Cyanocobalamin (B-12) 5000 MCG CAPS Take 5,000 mcg by mouth daily.     Glucose 15 g PACK Take 15-30 g by mouth daily as needed (low blood sugar).     insulin aspart (NOVOLOG) 100 UNIT/ML injection Inject into the skin  continuous. Uses in pump basal rate 3.0 units per hour, bolus 1 unit per 30 carbs     levocetirizine (XYZAL ALLERGY 24HR) 5 MG tablet Take 1 tablet (5 mg total) by mouth every evening. 30 tablet 0   levothyroxine (SYNTHROID) 125 MCG tablet Take 125 mcg by mouth daily before breakfast.     losartan-hydrochlorothiazide (HYZAAR) 100-25 MG tablet Take 1 tablet by mouth every morning. 90 tablet 3   Magnesium 400 MG TABS Take 400 mg by mouth daily.     Melatonin 10 MG TABS Take 10 mg by mouth at bedtime.     naproxen sodium (ANAPROX) 220 MG tablet Take 220 mg by mouth daily as needed (headaches).     omeprazole (PRILOSEC) 40 MG capsule Take 40 mg by mouth daily.     OVER THE COUNTER MEDICATION Take 4 tablets by mouth at bedtime. Focus factor otc supplement     potassium chloride SA (K-DUR,KLOR-CON) 20 MEQ tablet Take 1 tablet (20 mEq total) by mouth 2 (two) times daily. 180 tablet 3   Probiotic Product (PROBIOTIC PO) Take 1 capsule by mouth daily.     rOPINIRole (REQUIP) 0.5 MG tablet Take 0.5 mg by mouth at bedtime.     sertraline (ZOLOFT) 100 MG tablet Take 100 mg by mouth daily.     simethicone (GAS-X) 80 MG chewable tablet Chew 1 tablet (80 mg total) by mouth 4 (four) times daily as needed for flatulence. 100 tablet 2   No current facility-administered medications for this visit.    ALLERGIES:   Penicillins and Ace inhibitors   SOCIAL HISTORY:  The patient  reports that she quit smoking about 33 years ago. Her smoking use included cigarettes. She has never used smokeless tobacco. She reports that she does not drink alcohol and does not use drugs.   FAMILY HISTORY:  The patient's family history includes Alcohol abuse in her brother; Cancer in her brother; Crohn's disease in her brother; Diabetes in her father; Hepatitis (age of onset: 69) in her mother.   REVIEW OF SYSTEMS:  Positive for fatigue, low back pain.   All other systems are reviewed and negative except as outlined in the  HPI  PHYSICAL EXAM: VS:  BP 138/60   Pulse 64   Ht 5\' 3"  (1.6 m)   Wt 176 lb 6.4 oz (80 kg)   SpO2 97%   BMI 31.25 kg/m  , BMI Body mass index is 31.25 kg/m. GEN: Well nourished, well developed, in no acute distress HEENT: normal Neck: No JVD. carotids 2+ with bilateral bruits Cardiac: The heart is RRR with a grade 3/6 mid peaking harsh crescendo decrescendo murmur at the right upper sternal border.  No edema. Pedal pulses 2+ = bilaterally  Respiratory:  clear to auscultation bilaterally GI: soft, nontender, nondistended, + BS  MS: no deformity or atrophy Skin: warm and dry, no rash Neuro:  Strength and sensation are intact Psych: euthymic mood, full affect  EKG:  EKG from 04/28/2021 reviewed and demonstrates sinus bradycardia 51 bpm, LVH with repolarization abnormality.  RECENT LABS: 04/28/2021: Hemoglobin 12.9; Platelets 204; Potassium 3.7; Sodium 142  No results found for requested labs within last 8760 hours.   CrCl cannot be calculated (Patient's most recent lab result is older than the maximum 21 days allowed.).   Wt Readings from Last 3 Encounters:  04/30/21 176 lb 6.4 oz (80 kg)  04/28/21 175 lb (79.4 kg)  04/17/21 181 lb 3.2 oz (82.2 kg)     CARDIAC STUDIES: Echo:  Images personally reviewed.  LV systolic function is normal with LVEF 55 to 60%.  The aortic valve is calcified and restricted, morphologically the valve appears to be bicuspid with fusion of the left and right cusps.  The peak transaortic velocity is 4.08 m/s with peak and mean transvalvular gradients of 67 and 41 mmHg, respectively.  There is mild mitral regurgitation, mild tricuspid regurgitation, and no other significant valvular disease.  Cardiac Cath:  Conclusion  Right and left heart catheterization 04/28/2021: Normal coronary arteries, left dominant circulation. LV: 171/8, EDP 17.  Ao 104/46, mean 67 mmHg.  Peak to peak pressure gradient 50 and mean pressure gradient across the aortic valve of 44.6  mmHg.  Calculated aortic valve area 0.89 cm.  QP/QS 1.00. RA 16/13, mean 13; RV 35/1, EDP 9; PA 26/9, mean 14.  PA saturation 74%.  PW 23/24, mean 22 mmHg. CO 5.17, CI 2.83.  Normal.   Recommendation: Patient will be evaluated by TAVR team and consideration for aortic valve replacement.  30 mL contrast utilized.   STS RISK CALCULATOR: Isolated AVR: Risk of Mortality: 2.185% Renal Failure: 1.522% Permanent Stroke: 1.846% Prolonged Ventilation: 5.944% DSW Infection: 0.136% Reoperation: 2.981% Morbidity or Mortality: 10.517% Short Length of Stay: 35.458% Long Length of Stay: 5.210%  ASSESSMENT AND PLAN: 18.  70 year old woman with severe, stage D1 aortic stenosis.  The patient has New York Heart Association functional class III symptoms of chronic shortness of breath and fatigue consistent with diastolic heart failure.  She appears to have a Sievers type I bicuspid aortic valve.  The patient clearly reports progressive symptoms over the past 3 to 6 months and this correlates with progressive changes demonstrated on her recent echocardiogram and confirmed by invasive evaluation with cardiac catheterization.  Echo and cardiac cath data is personally reviewed.  Notably, she has no significant coronary artery disease.  Both studies demonstrate findings consistent with severe aortic stenosis as outlined above.  I have reviewed the natural history of aortic stenosis with the patient today. We have discussed the limitations of medical therapy and the poor prognosis associated with symptomatic aortic stenosis. We have reviewed potential treatment options, including palliative medical therapy, conventional surgical aortic valve replacement, and transcatheter aortic valve replacement. We discussed treatment options in the context of the patient's specific comorbid medical conditions.  Significant comorbid medical conditions include chronic asthma, well-controlled hypertension, and insulin-dependent  diabetes.  I discussed treatment options of TAVR versus conventional surgical AVR at length with the patient today.  We reviewed the pros and cons of each procedure.  I demonstrated a TAVR procedural animation with her and went through all of the steps of the procedure as well as potential complications and expected recovery.  I have recommended that she undergoes a gated CTA of the heart and a CTA  of the chest, abdomen, and pelvis.  This will evaluate valve morphology, anatomic suitability for TAVR, and also evaluate for the presence of aortic dilatation in the setting of a bicuspid aortic valve.  Once her CT scan is completed, she will undergo formal cardiac surgical consultation for multidisciplinary approach to her care.  Enzo Bi 04/30/2021 8:52 AM     [facility] [address] [place] [address]  [phone] (office) [phone] (fax)

## 2021-05-05 ENCOUNTER — Other Ambulatory Visit: Payer: Self-pay | Admitting: Cardiology

## 2021-05-05 DIAGNOSIS — Q231 Congenital insufficiency of aortic valve: Secondary | ICD-10-CM

## 2021-05-05 DIAGNOSIS — I10 Essential (primary) hypertension: Secondary | ICD-10-CM

## 2021-05-06 ENCOUNTER — Ambulatory Visit (HOSPITAL_COMMUNITY): Payer: Medicare Other

## 2021-05-06 ENCOUNTER — Encounter: Payer: Self-pay | Admitting: Adult Health

## 2021-05-06 ENCOUNTER — Ambulatory Visit (HOSPITAL_COMMUNITY)
Admission: RE | Admit: 2021-05-06 | Discharge: 2021-05-06 | Disposition: A | Discharge status: Home / Self Care | Payer: Medicare Other | Source: Ambulatory Visit | Attending: Cardiovascular Disease | Admitting: Cardiovascular Disease

## 2021-05-06 ENCOUNTER — Ambulatory Visit (INDEPENDENT_AMBULATORY_CARE_PROVIDER_SITE_OTHER): Payer: Medicare Other | Admitting: Adult Health

## 2021-05-06 ENCOUNTER — Other Ambulatory Visit: Payer: Self-pay

## 2021-05-06 VITALS — BP 134/73 | HR 54 | Ht 63.0 in | Wt 178.4 lb

## 2021-05-06 DIAGNOSIS — Z9049 Acquired absence of other specified parts of digestive tract: Secondary | ICD-10-CM | POA: Diagnosis not present

## 2021-05-06 DIAGNOSIS — G4733 Obstructive sleep apnea (adult) (pediatric): Secondary | ICD-10-CM | POA: Diagnosis not present

## 2021-05-06 DIAGNOSIS — I6522 Occlusion and stenosis of left carotid artery: Secondary | ICD-10-CM | POA: Diagnosis not present

## 2021-05-06 DIAGNOSIS — Z9989 Dependence on other enabling machines and devices: Secondary | ICD-10-CM | POA: Diagnosis not present

## 2021-05-06 DIAGNOSIS — K760 Fatty (change of) liver, not elsewhere classified: Secondary | ICD-10-CM | POA: Diagnosis not present

## 2021-05-06 DIAGNOSIS — I350 Nonrheumatic aortic (valve) stenosis: Secondary | ICD-10-CM | POA: Insufficient documentation

## 2021-05-06 MED ORDER — IOHEXOL 350 MG/ML SOLN
100.0000 mL | Freq: Once | INTRAVENOUS | Status: AC | PRN
  Administered 2021-05-06: 100 mL via INTRAVENOUS

## 2021-05-06 NOTE — Patient Instructions (Signed)
Continue using CPAP nightly and greater than 4 hours each night °If your symptoms worsen or you develop new symptoms please let us know.  ° °

## 2021-05-06 NOTE — Progress Notes (Signed)
PATIENT: [name] DOB: 1951/07/23  REASON FOR VISIT: follow up HISTORY FROM: patient  HISTORY OF PRESENT ILLNESS: Today 05/06/21:  [name] is a 70 year old female with a history of OSA on CPAP. She returns today for follow-up.  She reports that the CPAP is working well for her.  Her last data was in April.  This most likely is due to the change in the wireless network.  I will send an order to the DME company regarding this.  The patient states that she is still using the machine nightly.  She will be scheduling open heart surgery in the coming weeks.    01/28/20: [name] is a 70 year old female with a history of OSA on CPAP. She returns today for follow-up.  Her download indicates that she used her machine 29 out of 30 days for compliance of 97%.  She use her machine greater than 4 hours each night.  On average she uses her machine 8 hours and 49 minutes.  Her residual AHI is 4.1 on 7 cm of water with EPR 3.  She reports that her CPAP continues to work well.  She returns today for follow-up.  HISTORY 01/25/19    [name] is a 70 year old female with a history of obstructive sleep apnea on CPAP.  She returns today for a virtual visit.  Her download indicates that she use her machine 29 out of 30 days for compliance of 97%.  She use her machine greater than 4 hours each night.  On average she uses her machine 8 hours and 28 minutes.  Her residual AHI is 2.5 on 6 cm of water with EPR of 3.  She does not have a significant leak.  She reports that when she saw Dr. Vickey Huger a year ago her pressure was supposed to be increased to 7.  She denies any new issues with the machine.  She joins me today for a virtual visit.  REVIEW OF SYSTEMS: Out of a complete 14 system review of symptoms, the patient complains only of the following symptoms, and all other reviewed systems are negative.  See HPI  ALLERGIES: Allergies  Allergen Reactions   Penicillins Anaphylaxis    Has patient  had a PCN reaction causing immediate rash, facial/tongue/throat swelling, SOB or lightheadedness with hypotension: No Has patient had a PCN reaction causing severe rash involving mucus membranes or skin necrosis: No Has patient had a PCN reaction that required hospitalization: Yes/ Has patient had a PCN reaction occurring within the last 10 years: No If all of the above answers are "NO", then may proceed with Cephalosporin use.    Ace Inhibitors     INDUCED BRONCHOSPASM    HOME MEDICATIONS: Outpatient Medications Prior to Visit  Medication Sig Dispense Refill   albuterol (PROVENTIL HFA;VENTOLIN HFA) 108 (90 Base) MCG/ACT inhaler Inhale 2 puffs into the lungs every 6 (six) hours as needed. 8.5 g 11   albuterol (PROVENTIL) (2.5 MG/3ML) 0.083% nebulizer solution Take 3 mLs (2.5 mg total) by nebulization every 6 (six) hours as needed. ICD10:J45.909 75 mL 1   ALPRAZolam (XANAX) 1 MG tablet TAKE ONE-HALF TABLET BY MOUTH ONCE DAILY AS NEEDED FOR STRESS (Patient taking differently: Take 0.5-1 mg by mouth daily as needed for anxiety.) 90 tablet 0   amLODipine (NORVASC) 10 MG tablet TAKE 1 TABLET EVERY DAY 90 tablet 3   atenolol (TENORMIN) 100 MG tablet TAKE 1 TABLET TWICE DAILY 180 tablet 3   atorvastatin (  LIPITOR) 40 MG tablet Take 1 tablet (40 mg total) daily by mouth. 90 tablet 3   augmented betamethasone dipropionate (DIPROLENE-AF) 0.05 % cream Apply 1 application topically 2 (two) times daily as needed (eczema).     Biotin 96789 MCG TABS Take 10,000 mcg by mouth daily.     carboxymethylcellulose (REFRESH PLUS) 0.5 % SOLN Place 1 drop into both eyes 3 (three) times daily as needed (dry eyes).     Cholecalciferol (VITAMIN D) 2000 units CAPS Take 1 capsule (2,000 Units total) by mouth daily. 30 capsule 3   Cyanocobalamin (B-12) 5000 MCG CAPS Take 5,000 mcg by mouth daily.     Glucose 15 g PACK Take 15-30 g by mouth daily as needed (low blood sugar).     insulin aspart (NOVOLOG) 100 UNIT/ML  injection Inject into the skin continuous. Uses in pump basal rate 3.0 units per hour, bolus 1 unit per 30 carbs     levocetirizine (XYZAL ALLERGY 24HR) 5 MG tablet Take 1 tablet (5 mg total) by mouth every evening. 30 tablet 0   levothyroxine (SYNTHROID) 125 MCG tablet Take 125 mcg by mouth daily before breakfast.     losartan-hydrochlorothiazide (HYZAAR) 100-25 MG tablet TAKE 1 TABLET EVERY MORNING 90 tablet 3   Magnesium 400 MG TABS Take 400 mg by mouth daily.     Melatonin 10 MG TABS Take 10 mg by mouth at bedtime.     naproxen sodium (ANAPROX) 220 MG tablet Take 220 mg by mouth daily as needed (headaches).     omeprazole (PRILOSEC) 40 MG capsule Take 40 mg by mouth daily.     OVER THE COUNTER MEDICATION Take 4 tablets by mouth at bedtime. Focus factor otc supplement     potassium chloride SA (K-DUR,KLOR-CON) 20 MEQ tablet Take 1 tablet (20 mEq total) by mouth 2 (two) times daily. 180 tablet 3   Probiotic Product (PROBIOTIC PO) Take 1 capsule by mouth daily.     rOPINIRole (REQUIP) 0.5 MG tablet Take 0.5 mg by mouth at bedtime.     sertraline (ZOLOFT) 100 MG tablet Take 100 mg by mouth daily.     simethicone (GAS-X) 80 MG chewable tablet Chew 1 tablet (80 mg total) by mouth 4 (four) times daily as needed for flatulence. 100 tablet 2   No facility-administered medications prior to visit.    PAST MEDICAL HISTORY: Past Medical History:  Diagnosis Date   Anxiety    Asthma    Depression    Depression    Diabetes mellitus    Fibromyalgia    GERD (gastroesophageal reflux disease)    Goiter    Hair loss    Hypertension    Menopause    Other and unspecified hyperlipidemia    Positive PPD    Small bowel obstruction (HCC) 02/09/2018   SVT (supraventricular tachycardia) (HCC)    Vasculitis (HCC)     PAST SURGICAL HISTORY: Past Surgical History:  Procedure Laterality Date   APPENDECTOMY     CHOLECYSTECTOMY     PARTIAL HYSTERECTOMY     RIGHT/LEFT HEART CATH AND CORONARY ANGIOGRAPHY  N/A 04/28/2021   Procedure: RIGHT/LEFT HEART CATH AND CORONARY ANGIOGRAPHY;  Surgeon: Yates Decamp, MD;  Location: [facility];  Service: Cardiovascular;  Laterality: N/A;    FAMILY HISTORY: Family History  Problem Relation Age of Onset   Hepatitis Mother 61       died from Hep A   Diabetes Father    Cancer Brother    Crohn's  disease Brother    Alcohol abuse Brother    Sleep apnea Neg Hx     SOCIAL HISTORY: Social History   Socioeconomic History   Marital status: Married    Spouse name: Not on file   Number of children: 2   Years of education: Not on file   Highest education level: Not on file  Occupational History   Not on file  Tobacco Use   Smoking status: Former    Years: 15.00    Types: Cigarettes    Quit date: 03/13/1988    Years since quitting: 33.1   Smokeless tobacco: Never  Vaping Use   Vaping Use: Never used  Substance and Sexual Activity   Alcohol use: No   Drug use: No   Sexual activity: Not on file  Other Topics Concern   Not on file  Social History Narrative   Caffeine 1-2 cups coffee daily.   Lives at home with husband and daughter.   Retired.   Associates degree.   Has 4 kids (2 Human resources officerBiological)   Social Determinants of Health   Financial Resource Strain: Not on file  Food Insecurity: Not on file  Transportation Needs: Not on file  Physical Activity: Not on file  Stress: Not on file  Social Connections: Not on file  Intimate Partner Violence: Not on file      PHYSICAL EXAM  Vitals:   05/06/21 0849  BP: 134/73  Pulse: (!) 54  Weight: 178 lb 6.4 oz (80.9 kg)  Height: 5\' 3"  (1.6 m)   Body mass index is 31.6 kg/m.  Generalized: Well developed, in no acute distress  Chest: Lungs clear to auscultation bilaterally  Neurological examination  Mentation: Alert oriented to time, place, history taking. Follows all commands speech and language fluent Cranial nerve II-XII: Extraocular movements were full, visual field were full on  confrontational test Head turning and shoulder shrug  were normal and symmetric. Motor: The motor testing reveals 5 over 5 strength of all 4 extremities. Good symmetric motor tone is noted throughout.  Sensory: Sensory testing is intact to soft touch on all 4 extremities. No evidence of extinction is noted.  Gait and station: Gait is normal.    DIAGNOSTIC DATA (LABS, IMAGING, TESTING) - I reviewed patient records, labs, notes, testing and imaging myself where available.  Lab Results  Component Value Date   WBC 7.5 04/28/2021   HGB 12.9 04/28/2021   HCT 38.0 04/28/2021   MCV 86.4 04/28/2021   PLT 204 04/28/2021      Component Value Date/Time   NA 142 04/28/2021 0812   K 3.7 04/28/2021 0812   CL 108 02/11/2018 0404   CO2 25 02/11/2018 0404   GLUCOSE 127 (H) 02/11/2018 0404   BUN 13 02/11/2018 0404   CREATININE 0.78 02/11/2018 0404   CREATININE 0.97 02/09/2018 1051   CALCIUM 8.0 (L) 02/11/2018 0404   PROT 8.0 02/09/2018 1756   ALBUMIN 4.3 02/09/2018 1756   AST 21 02/09/2018 1756   ALT 15 02/09/2018 1756   ALKPHOS 58 02/09/2018 1756   BILITOT 0.7 02/09/2018 1756   GFRNONAA >60 02/11/2018 0404   GFRNONAA 60 02/09/2018 1051   GFRAA >60 02/11/2018 0404   GFRAA 70 02/09/2018 1051   Lab Results  Component Value Date   CHOL 147 01/09/2018   HDL 40 (L) 01/09/2018   LDLCALC 74 01/09/2018   TRIG 252 (H) 01/09/2018   CHOLHDL 3.7 01/09/2018   Lab Results  Component Value Date   HGBA1C  6.7 12/20/2014   Lab Results  Component Value Date   VITAMINB12 626 10/21/2006   Lab Results  Component Value Date   TSH 1.82 01/09/2018      ASSESSMENT AND PLAN 69 y.o. year old female  has a past medical history of Anxiety, Asthma, Depression, Depression, Diabetes mellitus, Fibromyalgia, GERD (gastroesophageal reflux disease), Goiter, Hair loss, Hypertension, Menopause, Other and unspecified hyperlipidemia, Positive PPD, Small bowel obstruction (HCC) (02/09/2018), SVT (supraventricular  tachycardia) (HCC), and Vasculitis (HCC). here with:  OSA on CPAP  - CPAP compliance excellent - Good treatment of AHI  - Encourage patient to use CPAP nightly and > 4 hours each night -Order sent to DME company regarding using SD card for [place] to obtain data - F/U in 1 year or sooner if needed     Butch Penny, MSN, NP-C 05/06/2021, 8:53 AM [facility] [address], [address] [place], [place] [address] [phone]

## 2021-05-12 NOTE — Progress Notes (Signed)
Thx. Looks like this has already been evaluated.

## 2021-05-27 ENCOUNTER — Encounter: Payer: Self-pay | Admitting: Surgery

## 2021-05-27 ENCOUNTER — Other Ambulatory Visit: Payer: Self-pay

## 2021-05-27 ENCOUNTER — Ambulatory Visit: Payer: Medicare Other | Admitting: Physical Therapy

## 2021-05-27 ENCOUNTER — Encounter: Payer: Self-pay | Admitting: *Deleted

## 2021-05-27 ENCOUNTER — Other Ambulatory Visit: Payer: Self-pay | Admitting: *Deleted

## 2021-05-27 ENCOUNTER — Institutional Professional Consult (permissible substitution) (INDEPENDENT_AMBULATORY_CARE_PROVIDER_SITE_OTHER): Payer: Medicare Other | Admitting: Surgery

## 2021-05-27 VITALS — BP 160/70 | HR 65 | Resp 20 | Ht 63.0 in | Wt 178.0 lb

## 2021-05-27 DIAGNOSIS — E139 Other specified diabetes mellitus without complications: Secondary | ICD-10-CM

## 2021-05-27 DIAGNOSIS — I6522 Occlusion and stenosis of left carotid artery: Secondary | ICD-10-CM

## 2021-05-27 DIAGNOSIS — I350 Nonrheumatic aortic (valve) stenosis: Secondary | ICD-10-CM

## 2021-05-27 DIAGNOSIS — E0865 Diabetes mellitus due to underlying condition with hyperglycemia: Secondary | ICD-10-CM

## 2021-05-27 NOTE — Progress Notes (Addendum)
Patient ID: [name], female   DOB: [birth year], 70 y.o.   MRN: [id number]  [facility]        [address]       [name] [address]             [phone]          CARDIOTHORACIC SURGERY CONSULTATION REPORT  PCP is Ashley Reichmann, DO Referring Provider is Ashley Carrow, [name] Primary Cardiologist is Ashley Decamp, [name]  Reason for consultation:  Severe bicuspid aortic valve stenosis.  HPI:  The patient is a 70 year old woman with a history of diabetes, fibromyalgia, hypertension, anxiety and depression, and severe aortic stenosis who was referred by [name] for consideration of aortic valve replacement.  Her most recent echo on 03/25/2021 showed a bicuspid aortic valve with a raphae between the left and right coronary cusp.  The valve was moderately calcified with restricted leaflet mobility.  The V-max was 4.0 m/s with a mean gradient of 41 mmHg consistent with severe aortic stenosis.  Aortic valve area was 0.6 cm.  There is mild aortic insufficiency and mild mitral regurgitation.  Left ventricular ejection fraction was 55 to 60% with moderate concentric LVH and grade 2 diastolic dysfunction.  The patient is here today with her husband.  She reports progressive exertional shortness of breath over the past 6 months.  She has a history of asthma with occasional episodes but thought that her increased symptoms were due to her asthma.  They were not getting better with use of her rescue inhaler.  She has also had substernal chest pressure with physical activity.  She reports orthopnea and frequently has to sit up in bed.  She has had no dizziness or syncope.  She has had exertional fatigue.  Past Medical History:  Diagnosis Date   Anxiety    Asthma    Depression    Depression    Diabetes mellitus    Fibromyalgia    GERD (gastroesophageal reflux disease)    Goiter    Hair loss    Hypertension     Menopause    Other and unspecified hyperlipidemia    Positive PPD    Small bowel obstruction (HCC) 02/09/2018   SVT (supraventricular tachycardia) (HCC)    Vasculitis (HCC)     Past Surgical History:  Procedure Laterality Date   APPENDECTOMY     CHOLECYSTECTOMY     PARTIAL HYSTERECTOMY     RIGHT/LEFT HEART CATH AND CORONARY ANGIOGRAPHY N/A 04/28/2021   Procedure: RIGHT/LEFT HEART CATH AND CORONARY ANGIOGRAPHY;  Surgeon: Ashley Decamp, [name];  Location: [facility];  Service: Cardiovascular;  Laterality: N/A;    Family History  Problem Relation Age of Onset   Hepatitis Mother 64       died from Hep A   Diabetes Father    Cancer Brother    Crohn's disease Brother    Alcohol abuse Brother    Sleep apnea Neg Hx     Social History   Socioeconomic History   Marital status: Married    Spouse name: Not on file   Number of children: 2   Years of education: Not on file   Highest education level: Not on file  Occupational History   Not on file  Tobacco Use   Smoking status: Former    Years: 15.00    Types: Cigarettes    Quit date: 03/13/1988    Years since quitting:  33.2   Smokeless tobacco: Never  Vaping Use   Vaping Use: Never used  Substance and Sexual Activity   Alcohol use: No   Drug use: No   Sexual activity: Not on file  Other Topics Concern   Not on file  Social History Narrative   Caffeine 1-2 cups coffee daily.   Lives at home with husband and daughter.   Retired.   Associates degree.   Has 4 kids (2 Human resources officer)   Social Determinants of Corporate investment banker Strain: Not on file  Food Insecurity: Not on file  Transportation Needs: Not on file  Physical Activity: Not on file  Stress: Not on file  Social Connections: Not on file  Intimate Partner Violence: Not on file    Prior to Admission medications   Medication Sig Start Date End Date Taking? Authorizing Provider  albuterol (PROVENTIL HFA;VENTOLIN HFA) 108 (90 Base) MCG/ACT inhaler Inhale 2  puffs into the lungs every 6 (six) hours as needed. 01/09/18   [name]  albuterol (PROVENTIL) (2.5 MG/3ML) 0.083% nebulizer solution Take 3 mLs (2.5 mg total) by nebulization every 6 (six) hours as needed. [id number]:Y56.389 01/10/18   [name]  ALPRAZolam (XANAX) 1 MG tablet TAKE ONE-HALF TABLET BY MOUTH ONCE DAILY AS NEEDED FOR STRESS Patient taking differently: Take 0.5-1 mg by mouth daily as needed for anxiety. 07/06/18   [name]  amLODipine (NORVASC) 10 MG tablet TAKE 1 TABLET EVERY DAY 05/05/21   Ashley Decamp, [name]  atenolol (TENORMIN) 100 MG tablet TAKE 1 TABLET TWICE DAILY 05/05/21   Ashley Decamp, [name]  atorvastatin (LIPITOR) 40 MG tablet Take 1 tablet (40 mg total) daily by mouth. 08/01/17   [name], [name]  augmented betamethasone dipropionate (DIPROLENE-AF) 0.05 % cream Apply 1 application topically 2 (two) times daily as needed (eczema). 03/13/21   Provider, Historical, [name]  Biotin 37342 MCG TABS Take 10,000 mcg by mouth daily.    Provider, Historical, [name]  carboxymethylcellulose (REFRESH PLUS) 0.5 % SOLN Place 1 drop into both eyes 3 (three) times daily as needed (dry eyes).    Provider, Historical, [name]  Cholecalciferol (VITAMIN D) 2000 units CAPS Take 1 capsule (2,000 Units total) by mouth daily. 11/28/17   [name]  Cyanocobalamin ([name]-12) 5000 MCG CAPS Take 5,000 mcg by mouth daily.    Provider, Historical, [name]  Glucose 15 g PACK Take 15-30 g by mouth daily as needed (low blood sugar).    Provider, Historical, [name]  insulin aspart (NOVOLOG) 100 UNIT/ML injection Inject into the skin continuous. Uses in pump basal rate 3.0 units per hour, bolus 1 unit per 30 carbs    Provider, Historical, [name]  levocetirizine (XYZAL ALLERGY 24HR) 5 MG tablet Take 1 tablet (5 mg total) by mouth every evening. 01/17/19   Ashley Berry, [name]  levothyroxine (SYNTHROID) 125 MCG tablet Take 125 mcg by mouth daily before breakfast.    Provider, Historical, [name]  losartan-hydrochlorothiazide  (HYZAAR) 100-25 MG tablet TAKE 1 TABLET EVERY MORNING 05/05/21   Ashley Decamp, [name]  Magnesium 400 MG TABS Take 400 mg by mouth daily.    Provider, Historical, [name]  Melatonin 10 MG TABS Take 10 mg by mouth at bedtime.    Provider, Historical, [name]  naproxen sodium (ANAPROX) 220 MG tablet Take 220 mg by mouth daily as needed (headaches).    Provider, Historical, [name]  omeprazole (PRILOSEC) 40 MG capsule Take 40 mg by mouth daily.  Provider, Historical, [name]  OVER THE COUNTER MEDICATION Take 4 tablets by mouth at bedtime. Focus factor otc supplement    Provider, Historical, [name]  potassium chloride SA (K-DUR,KLOR-CON) 20 MEQ tablet Take 1 tablet (20 mEq total) by mouth 2 (two) times daily. 11/28/17   [name], [name]  Probiotic Product (PROBIOTIC PO) Take 1 capsule by mouth daily.    Provider, Historical, [name]  rOPINIRole (REQUIP) 0.5 MG tablet Take 0.5 mg by mouth at bedtime.    Provider, Historical, [name]  sertraline (ZOLOFT) 100 MG tablet Take 100 mg by mouth daily.    Provider, Historical, [name]  simethicone (GAS-X) 80 MG chewable tablet Chew 1 tablet (80 mg total) by mouth 4 (four) times daily as needed for flatulence. 02/13/18 04/28/21  Calvert Cantor, [name]    Current Outpatient Medications  Medication Sig Dispense Refill   albuterol (PROVENTIL HFA;VENTOLIN HFA) 108 (90 Base) MCG/ACT inhaler Inhale 2 puffs into the lungs every 6 (six) hours as needed. 8.5 g 11   albuterol (PROVENTIL) (2.5 MG/3ML) 0.083% nebulizer solution Take 3 mLs (2.5 mg total) by nebulization every 6 (six) hours as needed. ICD10:J45.909 75 mL 1   ALPRAZolam (XANAX) 1 MG tablet TAKE ONE-HALF TABLET BY MOUTH ONCE DAILY AS NEEDED FOR STRESS (Patient taking differently: Take 0.5-1 mg by mouth daily as needed for anxiety.) 90 tablet 0   amLODipine (NORVASC) 10 MG tablet TAKE 1 TABLET EVERY DAY 90 tablet 3   atenolol (TENORMIN) 100 MG tablet TAKE 1 TABLET TWICE DAILY 180 tablet 3   atorvastatin (LIPITOR) 40 MG tablet Take 1 tablet (40 mg total) daily  by mouth. 90 tablet 3   augmented betamethasone dipropionate (DIPROLENE-AF) 0.05 % cream Apply 1 application topically 2 (two) times daily as needed (eczema).     Biotin 15726 MCG TABS Take 10,000 mcg by mouth daily.     carboxymethylcellulose (REFRESH PLUS) 0.5 % SOLN Place 1 drop into both eyes 3 (three) times daily as needed (dry eyes).     Cholecalciferol (VITAMIN D) 2000 units CAPS Take 1 capsule (2,000 Units total) by mouth daily. 30 capsule 3   Cyanocobalamin ([name]-12) 5000 MCG CAPS Take 5,000 mcg by mouth daily.     Glucose 15 g PACK Take 15-30 g by mouth daily as needed (low blood sugar).     insulin aspart (NOVOLOG) 100 UNIT/ML injection Inject into the skin continuous. Uses in pump basal rate 3.0 units per hour, bolus 1 unit per 30 carbs     levocetirizine (XYZAL ALLERGY 24HR) 5 MG tablet Take 1 tablet (5 mg total) by mouth every evening. 30 tablet 0   levothyroxine (SYNTHROID) 125 MCG tablet Take 125 mcg by mouth daily before breakfast.     losartan-hydrochlorothiazide (HYZAAR) 100-25 MG tablet TAKE 1 TABLET EVERY MORNING 90 tablet 3   Magnesium 400 MG TABS Take 400 mg by mouth daily.     Melatonin 10 MG TABS Take 10 mg by mouth at bedtime.     naproxen sodium (ANAPROX) 220 MG tablet Take 220 mg by mouth daily as needed (headaches).     omeprazole (PRILOSEC) 40 MG capsule Take 40 mg by mouth daily.     OVER THE COUNTER MEDICATION Take 4 tablets by mouth at bedtime. Focus factor otc supplement     potassium chloride SA (K-DUR,KLOR-CON) 20 MEQ tablet Take 1 tablet (20 mEq total) by mouth 2 (two) times daily. 180 tablet 3   Probiotic Product (PROBIOTIC PO) Take 1 capsule by mouth daily.  rOPINIRole (REQUIP) 0.5 MG tablet Take 0.5 mg by mouth at bedtime.     sertraline (ZOLOFT) 100 MG tablet Take 100 mg by mouth daily.     simethicone (GAS-X) 80 MG chewable tablet Chew 1 tablet (80 mg total) by mouth 4 (four) times daily as needed for flatulence. 100 tablet 2   No current  facility-administered medications for this visit.    Allergies  Allergen Reactions   Penicillins Anaphylaxis    Has patient had a PCN reaction causing immediate rash, facial/tongue/throat swelling, SOB or lightheadedness with hypotension: No Has patient had a PCN reaction causing severe rash involving mucus membranes or skin necrosis: No Has patient had a PCN reaction that required hospitalization: Yes/ Has patient had a PCN reaction occurring within the last 10 years: No If all of the above answers are "NO", then may proceed with Cephalosporin use.    Ace Inhibitors     INDUCED BRONCHOSPASM      Review of Systems:   General:  normal appetite, + decreased energy, no weight gain, no weight loss, no fever  Cardiac:  + chest pain with exertion, + chest pain at rest, +SOB with low level exertion, + resting SOB, no PND, + orthopnea, + palpitations, + arrhythmia, no atrial fibrillation, no LE edema, no dizzy spells, no syncope  Respiratory:  +  shortness of breath, no home oxygen, no productive cough, no dry cough, no bronchitis, no wheezing, no hemoptysis, + asthma, no pain with inspiration or cough,  sleep apnea, + CPAP at night  GI:   + difficulty swallowing, + reflux, no frequent heartburn, no hiatal hernia, no abdominal pain, no constipation, no diarrhea, no hematochezia, no hematemesis, no melena  GU:   no dysuria,  no frequency, no urinary tract infection, no hematuria, no kidney stones, no kidney disease  Vascular:  no pain suggestive of claudication, no pain in feet, no leg cramps, no varicose veins, no DVT, no non-healing foot ulcer  Neuro:   no stroke, no TIA's, no seizures, + headaches, no temporary blindness one eye,  no slurred speech, no peripheral neuropathy, no chronic pain, no instability of gait, no memory/cognitive dysfunction  Musculoskeletal: + arthritis, no joint swelling, no myalgias, no difficulty walking, normal mobility   Skin:   no rash, no itching, no skin  infections, no pressure sores or ulcerations  Psych:   + anxiety, + depression, no nervousness, no unusual recent stress  Eyes:   no blurry vision, no floaters, no recent vision changes, + wears glasses   ENT:   no hearing loss, no loose or painful teeth, + partial dentures, last saw dentist 6 months ago  Hematologic:  no easy bruising, no abnormal bleeding, no clotting disorder, no frequent epistaxis  Endocrine:  + diabetes on insulin pump, does  check CBG's at home     Physical Exam:   Ht 5\' 3"  (1.6 m)   Wt 178 lb (80.7 kg)   BMI 31.53 kg/m   General:  well-appearing  HEENT:  Unremarkable, NCAT, PERLA, EOMI  Neck:   no JVD, + bruits or transmitted murmur bilat, no adenopathy  Chest:   clear to auscultation, symmetrical breath sounds, no wheezes, no rhonchi   CV:   RRR, 3/6 systolic murmur RSB, no diastolic murmur  Abdomen:  soft, non-tender, no masses   Extremities:  warm, well-perfused, pulses palpable at ankle, + lower extremity edema  Rectal/GU  Deferred  Neuro:   Grossly non-focal and symmetrical throughout  Skin:  Clean and dry, no rashes, no breakdown  Diagnostic Tests:  Left Ventricle Normal LVEDV (A4C) 81.8 ml (46 - 106) LVEDV (A4C) / BSA 41.76 ml/m2 (29 - 61) LVESV (A4C) 34.8 ml (14 - 42) LVESV (A4C) / BSA 17.77 ml/m2 (8 - 24) LVEDV 128.8 ml LVESV 76.8 ml EF 45 % (54 - 74) LVSV 52.0 ml (81 - 109) LVSV / BSA 26.55 ml/m2 (18 - 46) IVSd 1.5 cm (0.6 - 0.9) LVIDd 5.2 cm (3.8 - 5.2) LVIDd / BSA 2.65 cm/m2 (2.4 - 3.2) LVPWd 1.5 cm (0.6 - 0.9) LV Mass 345.7 g (67 - 162) LV Mass / BSA 176.51 g/m2 (43 - 95) LV FS (midwall) 20 % (15 - 23) LVIDs 4.2 cm (2.2 - 3.5) LVRWT 0.58 LV Length D 7.1 cm LV length S 6.4 cm Left Atrium Normal LA 3.4 cm (2.7 - 3.8) LA / BSA 1.74 cm/m2 (1.5 - 2.3) LA Volume 65.0 ml (22 - 52) LA Volume / BSA 33.90 ml/m2 (16 - 34) Mitral Valve Normal MV Pk E Vel 0.9 m/s (0.7 - 1.2) MV Pk A Vel 0.8 m/s (0.4 - 0.7) MV E/A Ratio 1.10 (0.6 -  1.32) MV Decel Time 264.0 ms (142 - 258) Septal e' 5.02 cm/s (6.2 - 14.6) Septal E/e' 17.00 (<8) MR Vel 5.3 m/s MV Reg Pressure Gr 112.1 mmHg Tricuspid Valve Normal TR Pk Vel 2.3 m/s TR Pk Grad 21.2 mmHg RVSP 24 mmHg (18 - 25) RAP 3.0 mmHg (<5) Aorta Normal AoD 3.8 cm Aortic Valve Normal LVOT Diam 2.2 cm (1.8 - 2.2) LVOT Pk Vel 0.60 m/s LVOT Pk Grad 1.5 mmHg LVOT VTI 19.4 cm (18 - 22) LVOT SV 52.0 ml (60 - 100) LVOT CO 2.5 l/min (4 - 8) AV Pk Vel 4.08 m/s AV Pk Grad 66.6 mmHg (<10) AV Mean Grad 41.0 mmHg (<=40) AV VTI 116.0 cm (18 - 22) AVA (Vmax) 0.56 cm2 (3 - 4) AVA (VTI) 0.6 cm2 (>1) AVA / BSA 0.32 cm2/m2 (>0.6) AV index 0.1 AI PHT 636.0 ms AR Vel 2.3 m/s AR Pk Grad 21.7 mmHg Pulmonary Valve Normal PV Pk Vel 0.9 m/s PV Pk Grad 3.0 mmHg PR Vel 1.0 m/s PR Pk Grad 4.3 mmHg Pulmonic Artery Normal LA / Ao 0.9 IVC/Pulmonic Vein Normal IVC Dim 1.9 cm (<2.1) Findings: 1. Left ventricle cavity is normal in size. Moderate concentric hypertrophy of the left ventricle. Normal global wall motion. Normal LV systolic function with visual EF 55-60%. Doppler evidence of grade II (pseudonormal) diastolic dysfunction, elevated LAP. 2. Left atrial cavity is normal in size. 3. Right atrial cavity is normal in size. 4. Right ventricle cavity is normal in size. Normal right ventricular function. 5. Bicuspid aortic valve with raphe between left and right coronary cusp. Moderate calcification. Severe aortic stenosis. Vmax 4.0 m/sec, mean PG 4 mmHg, AVA 0.6 cm2 by continuity equation. Mild aortic regurgitation. 6. Mild (Grade I) mitral regurgitation. 7. Mild tricuspid regurgitation. Estimated pulmonary artery systolic pressure 24 mmHg. 8. Trace pulmonic regurgitation. 9. No evidence of significant pericardial effusion. 10. The aortic root is normal. 11. Normal pulmonary artery. 12. IVC is normal with respiratory variation. Normal right atrial pressure. 1. Left ventricle  cavity is normal in size. Moderate concentric hypertrophy of the left ventricle. Normal global wall motion. Normal LV systolic function with visual EF 55-60%. Doppler evidence of grade II (pseudonormal) diastolic dysfunction, elevated LAP. 2. Bicuspid aortic valve with raphe between left and right coronary cusp. Moderate calcification. Severe aortic stenosis. Vmax 4.0  m/sec, mean PG 4 mmHg, AVA 0.6 cm2 by continuity equation. Mild aortic regurgitation. 3. Mild (Grade I) mitral regurgitation. 4. Mild tricuspid regurgitation. 5. No evidence of pulmonary hypertension. 6. Compared to previous studies in 2020 and 2021, aortic stenosis severity is increased from moderate. Conclusions: March 28, 2021 08:56 PM EDT Truett Mainland, [name], [facility] Electronically Signed on [facility]  Physicians  Panel Physicians Referring Physician Case Authorizing Physician  Ashley Decamp, [name] (Primary)     Procedures  RIGHT/LEFT HEART CATH AND CORONARY ANGIOGRAPHY   Conclusion  Right and left heart catheterization 04/28/2021: Normal coronary arteries, left dominant circulation. LV: 171/8, EDP 17.  Ao 104/46, mean 67 mmHg.  Peak to peak pressure gradient 50 and mean pressure gradient across the aortic valve of 44.6 mmHg.  Calculated aortic valve area 0.89 cm.  QP/QS 1.00. RA 16/13, mean 13; RV 35/1, EDP 9; PA 26/9, mean 14.  PA saturation 74%.  PW 23/24, mean 22 mmHg. CO 5.17, CI 2.83.  Normal.   Recommendation: Patient will be evaluated by TAVR team and consideration for aortic valve replacement.  30 mL contrast utilized.   Recommendations  Antiplatelet/Anticoag No indication for antiplatelet therapy at this time .   Indications  Dyspnea on exertion [R06.00 (ICD-10-CM)]  Severe aortic stenosis [I35.0 (ICD-10-CM)]   Procedural Details  Technical Details Procedure performed:  Left heart catheterization including hemodynamic monitoring of the left ventricle, LV gram, selective right and left  coronary arteriography. Right heart catheterization and cannulation of cardiac output and cardiac index by Fick.  Indication: [name]  is a 70 y.o. female  with history of hypertension,  hyperlipidemia,  Diabetes Mellitus   who presents with worsening dyspnea, recent echo demonstrated increasing PG across AV and severe AS. Hence is brought to the cardiac catheterization lab to evaluate the  coronary anatomy for definitive diagnosis of CAD.  Right AC vein access for right heart and Right radial access for left heart catheterization was utilized for performing the procedure.   Technique:   A 5 French  sheath introduced into right right AC  vein for access under fluroscopic guidance.  A 5 French Swan-Ganz catheter was advanced with balloon inflated  under fluoroscopic guidance into first the right atrium followed by the right ventricle and into the pulmonary artery to pulmonary artery wedge position. Hemodynamics were obtained in a locations.  After hemodynamics were completed, samples were taken for SaO2% measurement to be used in Fick  Calculation of cardiac output and cardiac index. The catheter was then pulled back the balloon down and then completely out of the body. Hemostasis obtained by manual pressure over the access site.  Under sterile precautions using a 6 French right radial  arterial access, a 6 French sheath was introduced into the right radial artery. A 5 [place] Tig 4 catheter was advanced into the ascending aorta selective  right coronary artery and left coronary artery was cannulated and angiography was performed in multiple views. The catheter was pulled back Out of the body over exchange length J-wire.  Same catheter was used to perform LV gram hemodynamics. Catheter exchanged out of the body over J-Wire. NO immediate complications noted. Patient tolerated the procedure well. Contrast used: 30mL.   Estimated blood loss <50 mL.   During this procedure medications were  administered to achieve and maintain moderate conscious sedation while the patient's heart rate, blood pressure, and oxygen saturation were continuously monitored and I was present face-to-face 100% of this time.   Medications (Filter:  Administrations occurring from 0730 to 0827 on 04/28/21)  important  Continuous medications are totaled by the amount administered until 04/28/21 0827.   midazolam (VERSED) injection (mg) Total dose:  2 mg Date/Time Rate/Dose/Volume Action   04/28/21 0749 1 mg Given   0754 1 mg Given    fentaNYL (SUBLIMAZE) injection (mcg) Total dose:  25 mcg Date/Time Rate/Dose/Volume Action   04/28/21 0749 25 mcg Given    lidocaine (PF) (XYLOCAINE) 1 % injection (mL) Total volume:  4 mL Date/Time Rate/Dose/Volume Action   04/28/21 0755 2 mL Given   0755 2 mL Given    Radial Cocktail/Verapamil only (mL) Total volume:  5 mL Date/Time Rate/Dose/Volume Action   04/28/21 0809 5 mL Given    heparin sodium (porcine) injection (Units) Total dose:  4,000 Units Date/Time Rate/Dose/Volume Action   04/28/21 0814 4,000 Units Given    iohexol (OMNIPAQUE) 350 MG/ML injection (mL) Total volume:  35 mL Date/Time Rate/Dose/Volume Action   04/28/21 0823 35 mL Given    Heparin (Porcine) in NaCl 1000-0.9 UT/500ML-% SOLN (mL) Total volume:  1,000 mL Date/Time Rate/Dose/Volume Action   04/28/21 0813 500 mL Given   0814 500 mL Given    Sedation Time  Sedation Time Physician-1: 30 minutes Contrast  Medication Name Total Dose  iohexol (OMNIPAQUE) 350 MG/ML injection 35 mL   Radiation/Fluoro  Fluoro time: 6.8 (min) DAP: 6899 (mGycm2) Cumulative Air Kerma: 129 (mGy) Complications  Complications documented before study signed (04/28/2021  8:44 AM)   No complications were associated with this study.  Documented by Ashley Decamp, [name] - 04/28/2021  8:43 AM     Coronary Findings  Diagnostic Dominance: Left Left Main  Vessel was injected. Vessel is normal in caliber.  Vessel is angiographically normal.  Left Anterior Descending  Vessel was injected. Vessel is normal in caliber. Vessel is angiographically normal.  Left Circumflex  Vessel was injected. Vessel is normal in caliber. Vessel is angiographically normal.  Right Coronary Artery  Vessel was injected. Vessel is normal in caliber. Vessel is angiographically normal.  Intervention  No interventions have been documented. Right Heart  Right Heart Pressures Hemodynamic findings consistent with mild pulmonary hypertension. Elevated LV EDP consistent with volume overload.   Left Heart  Left Ventricle LV end diastolic pressure is mildly elevated.   Coronary Diagrams  Diagnostic Dominance: Left Intervention  Implants     No implant documentation for this case.   Syngo Images   Show images for CARDIAC CATHETERIZATION Images on Long Term Storage   Show images for [name], Reid to Procedure Log  Procedure Log    Hemo Data  Flowsheet Row Most Recent Value  Fick Cardiac Output 5.17 L/min  Fick Cardiac Output Index 2.83 (L/min)/BSA  Aortic Mean Gradient 44.6 mmHg  Aortic Peak Gradient 50 mmHg  Aortic Valve Area 0.89  Aortic Value Area Index 0.49 cm2/BSA  RA A Wave 16 mmHg  RA V Wave 13 mmHg  RA Mean 13 mmHg  RV Systolic Pressure 35 mmHg  RV Diastolic Pressure 1 mmHg  RV EDP 9 mmHg  PA Systolic Pressure 26 mmHg  PA Diastolic Pressure 9 mmHg  PA Mean 14 mmHg  PW A Wave 23 mmHg  PW V Wave 24 mmHg  PW Mean 22 mmHg  AO Systolic Pressure 104 mmHg  AO Diastolic Pressure 46 mmHg  AO Mean 67 mmHg  LV Systolic Pressure 171 mmHg  LV Diastolic Pressure 8 mmHg  LV EDP 17 mmHg  AOp Systolic Pressure 124  mmHg  AOp Diastolic Pressure 37 mmHg  AOp Mean Pressure 73 mmHg  LVp Systolic Pressure 174 mmHg  LVp Diastolic Pressure 11 mmHg  LVp EDP Pressure 24 mmHg  QP/QS 1  TPVR Index 4.94 HRUI  TSVR Index 23.63 HRUI  TPVR/TSVR Ratio 0.21   Narrative & Impression  CLINICAL DATA:   Aortic Stenosis   EXAM: Cardiac TAVR CT   TECHNIQUE: The patient was scanned on a Siemens Force 192 slice scanner. A 120 kV retrospective scan was triggered in the ascending thoracic aorta at 140 HU's. Gantry rotation speed was 250 msecs and collimation was .6 mm. No beta blockade or nitro were given. The 3D data set was reconstructed in 5% intervals of the R-R cycle. Systolic and diastolic phases were analyzed on a dedicated work station using MPR, MIP and VRT modes. The patient received 80 cc of contrast.   FINDINGS: Aortic Valve: Calcium score 2929 Bicuspid Sievers Type 1 with raphe between right and left cusp   Aorta: MIld calcific atherosclerosis no aneurysm or coarctation normal arch vessels   Sino-tubular Junction: 27 mm   Ascending Thoracic Aorta: 37 mm   Aortic Arch: 23 mm   Descending Thoracic Aorta: 24 mm   Sinus of Valsalva Measurements:   Non cusp 33.5 mm   Left cusp 35.3 mm   Right cusp 32/7 mm   Coronary Artery Height above Annulus:   Left Main: 14.6 mm above annulus   Right Coronary: 15.3 mm above annulus small non dominant vessel   Virtual Basal Annulus Measurements:   Maximum / Minimum Diameter: 27.5 mm x 33.2 mm   Perimeter: 96.8 mm   Area: 690 mm 2   Coronary Arteries: Left dominant sufficient height above annulus for deployment   Optimum Fluoroscopic Angle for Delivery: LAO 15 Caudal 16   IMPRESSION: 1. Calcified bicuspid Sievers Type one valve with raphe between right/left cusps Score 2929   2. Annular area 690 mm2 upper limit for 29 mm may consider adding cc's to inflation   3.  No aortic aneurysms normal arch vessels ascending aorta 3.7 cm   4. Left dominant coronary arteries sufficient height above annulus for deployment   5. Optimum angiographic angle for deployment LAO 15 Caudal 16 degrees   Charlton Haws   Electronically Signed: By: Charlton Haws M.D. On: 05/06/2021 12:03     STS RISK CALCULATOR: Isolated  AVR: Risk of Mortality: 2.185% Renal Failure: 1.522% Permanent Stroke: 1.846% Prolonged Ventilation: 5.944% DSW Infection: 0.136% Reoperation: 2.981% Morbidity or Mortality: 10.517% Short Length of Stay: 35.458% Long Length of Stay: 5.210%  Impression:  This 70 year old woman has stage D, severe, symptomatic aortic stenosis with New York Heart Association class III symptoms of exertional fatigue and shortness of breath consistent with chronic diastolic congestive heart failure.  She also has substernal chest pressure with exertion as well as at rest when laying down and has orthopnea.  I have personally reviewed her 2D echocardiogram, cardiac catheterization, and CTA studies.  Her echocardiogram shows a bicuspid aortic valve with moderate calcification and thickening of the leaflets and restricted leaflet mobility.  The mean gradient is 41 mmHg consistent with severe aortic stenosis.  Left ventricular ejection fraction is normal.  Cardiac catheterization showed normal coronary arteries with a mean gradient across aortic valve of 45 mmHg and a peak to peak gradient of 50 mmHg consistent with severe aortic stenosis.  I agree that aortic valve replacement is indicated in this patient for relief of her symptoms and to  prevent progressive left ventricular deterioration.  I discussed the alternatives of open surgical aortic valve replacement and transcatheter aortic valve replacement.  She said that she has not interested in having TAVR and would rather have open surgical aortic valve replacement.  She is concerned about lack of long-term durability data and she had a friend who had TAVR that had a difficult time.  She does have a calcified bicuspid aortic valve that is at the upper limit of the range for a 29 mm SAPIEN valve.  I think that open surgical aortic valve replacement is a reasonable option for treating her.  I would use a bioprosthetic valve given her age. I discussed the operative  procedure with the patient and family including alternatives, benefits and risks; including but not limited to bleeding, blood transfusion, infection, stroke, myocardial infarction, graft failure, heart block requiring a permanent pacemaker, organ dysfunction, and death.  Brand Males understands and agrees to proceed.    Plan:  She will be scheduled for open surgical aortic valve replacement using a bioprosthetic valve on Friday, 06/05/2021.  I spent 60 minutes performing this consultation and > 50% of this time was spent face to face counseling and coordinating the care of this patient's severe symptomatic aortic stenosis.   Alleen Borne, [name] 05/27/2021

## 2021-06-02 NOTE — Pre-Procedure Instructions (Signed)
Surgical Instructions    Your procedure is scheduled on Friday, September 23rd, 2022.  Report to [facility] Main Entrance "A" at 05:30 A.M., then check in with the Admitting office.  Call this number if you have problems the morning of surgery:  [phone]   If you have any questions prior to your surgery date call [phone]: Open Monday-Friday 8am-4pm    Remember:  Do not eat or drink after midnight the night before your surgery    Take these medicines the morning of surgery with A SIP OF WATER: amLODipine (NORVASC)  atenolol (TENORMIN) atorvastatin (LIPITOR) levothyroxine (SYNTHROID)  omeprazole (PRILOSEC)  sertraline (ZOLOFT)   Take these medicines the morning of surgery with A SIP OF WATER AS NEEDED: acetaminophen (TYLENOL)  albuterol (PROVENTIL HFA;VENTOLIN HFA)  albuterol (PROVENTIL) (2.5 MG/3ML)  ALPRAZolam (XANAX)    As of today, STOP taking any Aspirin (unless otherwise instructed by your surgeon) Aleve, Naproxen, Ibuprofen, Motrin, Advil, Goody's, BC's, all herbal medications, fish oil, and all vitamins.    Patients with Insulin Pumps  For patients with Insulin Pumps: Contact your diabetes doctor for specific instructions before surgery. Decrease basal insulin rates by 20% at midnight the night before surgery if not able to obtain specific instructions from diabetes doctor. Do not remove your insulin pump prior to arrival to short stay the morning of surgery.  Anesthesia will instruct you on when to remove your pump. Note that if your surgery is planned to be longer than 2 hours, your insulin pump will be removed and intravenous (IV) insulin will be started and managed by the nurses and anesthesiologist. You will be able to restart your insulin pump once you are awake and able to manage it. Make sure to bring insulin pump supplies to the hospital with you in case your site needs to be changed.    HOW TO MANAGE YOUR DIABETES BEFORE AND AFTER SURGERY  Why is  it important to control my blood sugar before and after surgery? Improving blood sugar levels before and after surgery helps healing and can limit problems. A way of improving blood sugar control is eating a healthy diet by:  Eating less sugar and carbohydrates  Increasing activity/exercise  Talking with your doctor about reaching your blood sugar goals High blood sugars (greater than 180 mg/dL) can raise your risk of infections and slow your recovery, so you will need to focus on controlling your diabetes during the weeks before surgery. Make sure that the doctor who takes care of your diabetes knows about your planned surgery including the date and location.  How do I manage my blood sugar before surgery? Check your blood sugar at least 4 times a day, starting 2 days before surgery, to make sure that the level is not too high or low.  Check your blood sugar the morning of your surgery when you wake up and every 2 hours until you get to the Short Stay unit.  If your blood sugar is less than 70 mg/dL, you will need to treat for low blood sugar: Do not take insulin. Treat a low blood sugar (less than 70 mg/dL) with  cup of clear juice (cranberry or apple), 4 glucose tablets, OR glucose gel. Recheck blood sugar in 15 minutes after treatment (to make sure it is greater than 70 mg/dL). If your blood sugar is not greater than 70 mg/dL on recheck, call [phone] for further instructions. Report your blood sugar to the short stay nurse when you get to Short Stay.  If you are admitted to the hospital after surgery: Your blood sugar will be checked by the staff and you will probably be given insulin after surgery (instead of oral diabetes medicines) to make sure you have good blood sugar levels. The goal for blood sugar control after surgery is 80-180 mg/dL.     Do not wear jewelry or makeup Do not wear lotions, powders, perfumes, or deodorant. Do not shave 48 hours prior to surgery.   Do not  bring valuables to the hospital. DO Not wear nail polish, gel polish, artificial nails, or any other type of covering on natural nails including finger and toenails. If patients have artificial nails, gel coating, etc. that need to be removed by a nail salon please have this removed prior to surgery or surgery may need to be canceled/delayed if the surgeon/ anesthesia feels like the patient is unable to be adequately monitored.             [facility] is not responsible for any belongings or valuables.  Do NOT Smoke (Tobacco/Vaping)  24 hours prior to your procedure If you use a CPAP at night, you may bring your mask for your overnight stay.   Contacts, glasses, dentures or bridgework may not be worn into surgery, please bring cases for these belongings   For patients admitted to the hospital, discharge time will be determined by your treatment team.   Patients discharged the day of surgery will not be allowed to drive home, and someone needs to stay with them for 24 hours.  NO VISITORS WILL BE ALLOWED IN PRE-OP WHERE PATIENTS GET READY FOR SURGERY.  ONLY 1 SUPPORT PERSON MAY BE PRESENT WHILE YOU ARE IN SURGERY.  IF YOU ARE TO BE ADMITTED, ONCE YOU ARE IN YOUR ROOM YOU WILL BE ALLOWED TWO (2) VISITORS.  Minor children may have two parents present. Special consideration for safety and communication needs will be reviewed on a case by case basis.  Special instructions:    Oral Hygiene is also important to reduce your risk of infection.  Remember - BRUSH YOUR TEETH THE MORNING OF SURGERY WITH YOUR REGULAR TOOTHPASTE   [place]- Preparing For Surgery  Before surgery, you can play an important role. Because skin is not sterile, your skin needs to be as free of germs as possible. You can reduce the number of germs on your skin by washing with CHG (chlorahexidine gluconate) Soap before surgery.  CHG is an antiseptic cleaner which kills germs and bonds with the skin to continue killing germs even  after washing.     Please do not use if you have an allergy to CHG or antibacterial soaps. If your skin becomes reddened/irritated stop using the CHG.  Do not shave (including legs and underarms) for at least 48 hours prior to first CHG shower. It is OK to shave your face.  Please follow these instructions carefully.     Shower the NIGHT BEFORE SURGERY and the MORNING OF SURGERY with CHG Soap.   If you chose to wash your hair, wash your hair first as usual with your normal shampoo. After you shampoo, rinse your hair and body thoroughly to remove the shampoo.  Then [employer] and genitals (private parts) with your normal soap and rinse thoroughly to remove soap.  After that Use CHG Soap as you would any other liquid soap. You can apply CHG directly to the skin and wash gently with a scrungie or a clean washcloth.   Apply  the CHG Soap to your body ONLY FROM THE NECK DOWN.  Do not use on open wounds or open sores. Avoid contact with your eyes, ears, mouth and genitals (private parts). Wash Face and genitals (private parts)  with your normal soap.   Wash thoroughly, paying special attention to the area where your surgery will be performed.  Thoroughly rinse your body with warm water from the neck down.  DO NOT shower/wash with your normal soap after using and rinsing off the CHG Soap.  Pat yourself dry with a CLEAN TOWEL.  Wear CLEAN PAJAMAS to bed the night before surgery  Place CLEAN SHEETS on your bed the night before your surgery  DO NOT SLEEP WITH PETS.   Day of Surgery:  Take a shower with CHG soap. Wear Clean/Comfortable clothing the morning of surgery Do not apply any deodorants/lotions.   Remember to brush your teeth WITH YOUR REGULAR TOOTHPASTE.   Please read over the following fact sheets that you were given.

## 2021-06-03 ENCOUNTER — Ambulatory Visit (HOSPITAL_COMMUNITY)
Admission: RE | Admit: 2021-06-03 | Discharge: 2021-06-03 | Disposition: A | Discharge status: Home / Self Care | Payer: Medicare Other | Source: Ambulatory Visit | Attending: Surgery | Admitting: Surgery

## 2021-06-03 ENCOUNTER — Encounter (HOSPITAL_COMMUNITY): Payer: Self-pay

## 2021-06-03 ENCOUNTER — Encounter (HOSPITAL_COMMUNITY)
Admission: RE | Admit: 2021-06-03 | Discharge: 2021-06-03 | Disposition: A | Discharge status: Home / Self Care | Payer: Medicare Other | Source: Ambulatory Visit | Attending: Surgery | Admitting: Surgery

## 2021-06-03 ENCOUNTER — Other Ambulatory Visit: Payer: Self-pay

## 2021-06-03 DIAGNOSIS — I471 Supraventricular tachycardia: Secondary | ICD-10-CM | POA: Diagnosis not present

## 2021-06-03 DIAGNOSIS — I10 Essential (primary) hypertension: Secondary | ICD-10-CM | POA: Diagnosis not present

## 2021-06-03 DIAGNOSIS — I350 Nonrheumatic aortic (valve) stenosis: Secondary | ICD-10-CM | POA: Insufficient documentation

## 2021-06-03 DIAGNOSIS — E119 Type 2 diabetes mellitus without complications: Secondary | ICD-10-CM | POA: Diagnosis not present

## 2021-06-03 DIAGNOSIS — Z01818 Encounter for other preprocedural examination: Secondary | ICD-10-CM | POA: Insufficient documentation

## 2021-06-03 DIAGNOSIS — E139 Other specified diabetes mellitus without complications: Secondary | ICD-10-CM

## 2021-06-03 DIAGNOSIS — E0865 Diabetes mellitus due to underlying condition with hyperglycemia: Secondary | ICD-10-CM | POA: Insufficient documentation

## 2021-06-03 DIAGNOSIS — R0602 Shortness of breath: Secondary | ICD-10-CM | POA: Diagnosis not present

## 2021-06-03 DIAGNOSIS — I272 Pulmonary hypertension, unspecified: Secondary | ICD-10-CM | POA: Diagnosis not present

## 2021-06-03 DIAGNOSIS — J45909 Unspecified asthma, uncomplicated: Secondary | ICD-10-CM | POA: Diagnosis not present

## 2021-06-03 DIAGNOSIS — Z20822 Contact with and (suspected) exposure to covid-19: Secondary | ICD-10-CM | POA: Insufficient documentation

## 2021-06-03 DIAGNOSIS — D62 Acute posthemorrhagic anemia: Secondary | ICD-10-CM | POA: Diagnosis not present

## 2021-06-03 DIAGNOSIS — R059 Cough, unspecified: Secondary | ICD-10-CM | POA: Diagnosis not present

## 2021-06-03 DIAGNOSIS — I340 Nonrheumatic mitral (valve) insufficiency: Secondary | ICD-10-CM | POA: Diagnosis not present

## 2021-06-03 DIAGNOSIS — Z006 Encounter for examination for normal comparison and control in clinical research program: Secondary | ICD-10-CM | POA: Diagnosis not present

## 2021-06-03 DIAGNOSIS — I352 Nonrheumatic aortic (valve) stenosis with insufficiency: Secondary | ICD-10-CM | POA: Diagnosis not present

## 2021-06-03 DIAGNOSIS — E039 Hypothyroidism, unspecified: Secondary | ICD-10-CM | POA: Diagnosis not present

## 2021-06-03 DIAGNOSIS — R079 Chest pain, unspecified: Secondary | ICD-10-CM | POA: Diagnosis not present

## 2021-06-03 DIAGNOSIS — F32A Depression, unspecified: Secondary | ICD-10-CM | POA: Diagnosis not present

## 2021-06-03 HISTORY — DX: Unspecified osteoarthritis, unspecified site: M19.90

## 2021-06-03 HISTORY — DX: Spondylosis without myelopathy or radiculopathy, lumbosacral region: M47.817

## 2021-06-03 HISTORY — DX: Nonrheumatic aortic (valve) insufficiency: I35.1

## 2021-06-03 HISTORY — DX: Dyspnea, unspecified: R06.00

## 2021-06-03 HISTORY — DX: Sleep apnea, unspecified: G47.30

## 2021-06-03 HISTORY — DX: Cardiac murmur, unspecified: R01.1

## 2021-06-03 HISTORY — DX: Nonrheumatic mitral (valve) insufficiency: I34.0

## 2021-06-03 HISTORY — DX: Nonrheumatic mitral (valve) prolapse: I34.1

## 2021-06-03 LAB — COMPREHENSIVE METABOLIC PANEL
ALT: 19 U/L (ref 0–44)
AST: 27 U/L (ref 15–41)
Albumin: 3.8 g/dL (ref 3.5–5.0)
Alkaline Phosphatase: 50 U/L (ref 38–126)
Anion gap: 11 (ref 5–15)
BUN: 15 mg/dL (ref 8–23)
CO2: 21 mmol/L — ABNORMAL LOW (ref 22–32)
Calcium: 9.4 mg/dL (ref 8.9–10.3)
Chloride: 105 mmol/L (ref 98–111)
Creatinine, Ser: 0.84 mg/dL (ref 0.44–1.00)
GFR, Estimated: >60 mL/min (ref >60)
Glucose, Bld: 113 mg/dL — ABNORMAL HIGH (ref 70–99)
Potassium: 3.9 mmol/L (ref 3.5–5.1)
Sodium: 137 mmol/L (ref 135–145)
Total Bilirubin: 1.1 mg/dL (ref 0.3–1.2)
Total Protein: 7.2 g/dL (ref 6.5–8.1)

## 2021-06-03 LAB — CBC
HCT: 44 % (ref 36.0–46.0)
Hemoglobin: 14.6 g/dL (ref 12.0–15.0)
MCH: 28.3 pg (ref 26.0–34.0)
MCHC: 33.2 g/dL (ref 30.0–36.0)
MCV: 85.3 fL (ref 80.0–100.0)
Platelets: 183 10*3/uL (ref 150–400)
RBC: 5.16 MIL/uL — ABNORMAL HIGH (ref 3.87–5.11)
RDW: 14.7 % (ref 11.5–15.5)
WBC: 7.8 10*3/uL (ref 4.0–10.5)
nRBC: 0 % (ref 0.0–0.2)

## 2021-06-03 LAB — TYPE AND SCREEN
ABO/RH(D): O POS
Antibody Screen: NEGATIVE

## 2021-06-03 LAB — SURGICAL PCR SCREEN
MRSA, PCR: NEGATIVE
Staphylococcus aureus: NEGATIVE

## 2021-06-03 LAB — URINALYSIS, ROUTINE W REFLEX MICROSCOPIC
Bilirubin Urine: NEGATIVE
Glucose, UA: NEGATIVE mg/dL
Hgb urine dipstick: NEGATIVE
Ketones, ur: NEGATIVE mg/dL
Leukocytes,Ua: NEGATIVE
Nitrite: NEGATIVE
Protein, ur: NEGATIVE mg/dL
Specific Gravity, Urine: 1.02 (ref 1.005–1.030)
pH: 6 (ref 5.0–8.0)

## 2021-06-03 LAB — PROTIME-INR
INR: 1 (ref 0.8–1.2)
Prothrombin Time: 13.1 seconds (ref 11.4–15.2)

## 2021-06-03 LAB — SARS CORONAVIRUS 2 (TAT 6-24 HRS): SARS Coronavirus 2: NEGATIVE

## 2021-06-03 LAB — GLUCOSE, CAPILLARY: Glucose-Capillary: 149 mg/dL — ABNORMAL HIGH (ref 70–99)

## 2021-06-03 NOTE — Progress Notes (Signed)
PCP - Irena Reichmann DO Cardiologist - Dr. Jacinto Halim  Chest x-ray - 06/03/21 EKG - 06/03/21 Stress Test - 09/02/14 ECHO - 03/25/21 Cardiac Cath - 04/28/21  Sleep Study - yes has OSA  CPAP - nightly  DM - Type I Has Dexcom monitor that communicates with her insulin pump.  Contacting diabetic coodinator. Dr. Romero Belling is her endocrinologist.  Fasting Blood Sugar - ranges around 130 Checks Blood Sugar __6-7___ times a day CBG at PAT appt 146  Blood Thinner Instructions:Denies Aspirin Instructions:Denies  COVID TEST-  06/03/21   Anesthesia review: Yes cardiac history   Patient denies shortness of breath, fever, cough and chest pain at PAT appointment   All instructions explained to the patient, with a verbal understanding of the material. Patient agrees to go over the instructions while at home for a better understanding. Patient also instructed to wear a mask while in public after being tested for COVID-19. The opportunity to ask questions was provided.

## 2021-06-03 NOTE — Progress Notes (Addendum)
Received call from lab that ABG would needed to be re-collected, not labeled properly.  Later received second call to notify me that the PTT had clotted and would need recollecting.  Also was informed by lab that the PT INR was clotted and the result on 06/03/21 should be disregarded.  Called and left message with Alycia Rossetti at Dr. Sharee Pimple office to make them aware.  Re-ordered the ABG, PT INR, and PTT to be collected again on day of surgery.

## 2021-06-04 LAB — HEMOGLOBIN A1C
Hgb A1c MFr Bld: 7.1 % — ABNORMAL HIGH (ref 4.8–5.6)
Mean Plasma Glucose: 157 mg/dL

## 2021-06-04 MED ORDER — NOREPINEPHRINE 4 MG/250ML-% IV SOLN
0.0000 ug/min | INTRAVENOUS | Status: DC
  Filled 2021-06-04: qty 250

## 2021-06-04 MED ORDER — LEVOFLOXACIN IN D5W 500 MG/100ML IV SOLN
500.0000 mg | INTRAVENOUS | Status: AC
  Administered 2021-06-05: 500 mg via INTRAVENOUS
  Filled 2021-06-04: qty 100

## 2021-06-04 MED ORDER — TRANEXAMIC ACID (OHS) PUMP PRIME SOLUTION
2.0000 mg/kg | INTRAVENOUS | Status: DC
  Filled 2021-06-04: qty 1.61

## 2021-06-04 MED ORDER — POTASSIUM CHLORIDE 2 MEQ/ML IV SOLN
80.0000 meq | INTRAVENOUS | Status: DC
  Filled 2021-06-04: qty 40

## 2021-06-04 MED ORDER — INSULIN REGULAR(HUMAN) IN NACL 100-0.9 UT/100ML-% IV SOLN
INTRAVENOUS | Status: AC
  Administered 2021-06-05: 2.2 [IU]/h via INTRAVENOUS
  Filled 2021-06-04: qty 100

## 2021-06-04 MED ORDER — VANCOMYCIN HCL 1250 MG/250ML IV SOLN
1250.0000 mg | INTRAVENOUS | Status: AC
  Administered 2021-06-05: 1250 mg via INTRAVENOUS
  Filled 2021-06-04: qty 250

## 2021-06-04 MED ORDER — MILRINONE LACTATE IN DEXTROSE 20-5 MG/100ML-% IV SOLN
0.3000 ug/kg/min | INTRAVENOUS | Status: DC
  Filled 2021-06-04: qty 100

## 2021-06-04 MED ORDER — PLASMA-LYTE A IV SOLN
INTRAVENOUS | Status: DC
  Filled 2021-06-04: qty 5

## 2021-06-04 MED ORDER — NITROGLYCERIN IN D5W 200-5 MCG/ML-% IV SOLN
2.0000 ug/min | INTRAVENOUS | Status: AC
  Administered 2021-06-05: 10 ug/min via INTRAVENOUS
  Filled 2021-06-04: qty 250

## 2021-06-04 MED ORDER — PHENYLEPHRINE HCL-NACL 20-0.9 MG/250ML-% IV SOLN
30.0000 ug/min | INTRAVENOUS | Status: AC
  Administered 2021-06-05: 20 ug/min via INTRAVENOUS
  Filled 2021-06-04: qty 250

## 2021-06-04 MED ORDER — MAGNESIUM SULFATE 50 % IJ SOLN
40.0000 meq | INTRAMUSCULAR | Status: DC
  Filled 2021-06-04: qty 9.85

## 2021-06-04 MED ORDER — DEXMEDETOMIDINE HCL IN NACL 400 MCG/100ML IV SOLN
0.1000 ug/kg/h | INTRAVENOUS | Status: AC
  Administered 2021-06-05: .7 ug/kg/h via INTRAVENOUS
  Filled 2021-06-04: qty 100

## 2021-06-04 MED ORDER — HEPARIN 30,000 UNITS/1000 ML (OHS) CELLSAVER SOLUTION
Status: DC
  Filled 2021-06-04: qty 1000

## 2021-06-04 MED ORDER — TRANEXAMIC ACID (OHS) BOLUS VIA INFUSION
15.0000 mg/kg | INTRAVENOUS | Status: AC
  Administered 2021-06-05: 1210.5 mg via INTRAVENOUS
  Filled 2021-06-04: qty 1211

## 2021-06-04 MED ORDER — TRANEXAMIC ACID 1000 MG/10ML IV SOLN
1.5000 mg/kg/h | INTRAVENOUS | Status: AC
  Administered 2021-06-05: 1.5 mg/kg/h via INTRAVENOUS
  Filled 2021-06-04: qty 25

## 2021-06-04 MED ORDER — EPINEPHRINE HCL 5 MG/250ML IV SOLN IN NS
0.0000 ug/min | INTRAVENOUS | Status: DC
  Filled 2021-06-04: qty 250

## 2021-06-04 NOTE — H&P (Signed)
[address]       [name] [address]             [phone]      Cardiothoracic Surgery Admission History and Physical   PCP is Irena Reichmann, DO Referring Provider is Verne Carrow, MD Primary Cardiologist is Yates Decamp, MD   Reason for admission:  Severe bicuspid aortic valve stenosis.   HPI:   The patient is a 70 year old woman with a history of diabetes, fibromyalgia, hypertension, anxiety and depression, and severe aortic stenosis who was referred by Dr. Jacinto Halim for consideration of aortic valve replacement.  Her most recent echo on 03/25/2021 showed a bicuspid aortic valve with a raphae between the left and right coronary cusp.  The valve was moderately calcified with restricted leaflet mobility.  The V-max was 4.0 m/s with a mean gradient of 41 mmHg consistent with severe aortic stenosis.  Aortic valve area was 0.6 cm.  There is mild aortic insufficiency and mild mitral regurgitation.  Left ventricular ejection fraction was 55 to 60% with moderate concentric LVH and grade 2 diastolic dysfunction.   The patient lives with her husband.  She reports progressive exertional shortness of breath over the past 6 months.  She has a history of asthma with occasional episodes but thought that her increased symptoms were due to her asthma.  They were not getting better with use of her rescue inhaler.  She has also had substernal chest pressure with physical activity.  She reports orthopnea and frequently has to sit up in bed.  She has had no dizziness or syncope.  She has had exertional fatigue.       Past Medical History:  Diagnosis Date   Anxiety     Asthma     Depression     Depression     Diabetes mellitus     Fibromyalgia     GERD (gastroesophageal reflux disease)     Goiter     Hair loss     Hypertension     Menopause     Other and unspecified hyperlipidemia     Positive PPD     Small bowel obstruction (HCC) 02/09/2018   SVT (supraventricular  tachycardia) (HCC)     Vasculitis (HCC)             Past Surgical History:  Procedure Laterality Date   APPENDECTOMY       CHOLECYSTECTOMY       PARTIAL HYSTERECTOMY       RIGHT/LEFT HEART CATH AND CORONARY ANGIOGRAPHY N/A 04/28/2021    Procedure: RIGHT/LEFT HEART CATH AND CORONARY ANGIOGRAPHY;  Surgeon: Yates Decamp, MD;  Location: [facility];  Service: Cardiovascular;  Laterality: N/A;           Family History  Problem Relation Age of Onset   Hepatitis Mother 71        died from Hep A   Diabetes Father     Cancer Brother     Crohn's disease Brother     Alcohol abuse Brother     Sleep apnea Neg Hx        Social History         Socioeconomic History   Marital status: Married      Spouse name: Not on file   Number of children: 2   Years of education: Not on file   Highest education level: Not on file  Occupational History   Not on file  Tobacco  Use   Smoking status: Former      Years: 15.00      Types: Cigarettes      Quit date: 03/13/1988      Years since quitting: 33.2   Smokeless tobacco: Never  Vaping Use   Vaping Use: Never used  Substance and Sexual Activity   Alcohol use: No   Drug use: No   Sexual activity: Not on file  Other Topics Concern   Not on file  Social History Narrative    Caffeine 1-2 cups coffee daily.    Lives at home with husband and daughter.    Retired.    Associates degree.    Has 4 kids (2 Human resources officer)    Social Determinants of Manufacturing engineer Strain: Not on file  Food Insecurity: Not on file  Transportation Needs: Not on file  Physical Activity: Not on file  Stress: Not on file  Social Connections: Not on file  Intimate Partner Violence: Not on file             Prior to Admission medications   Medication Sig Start Date End Date Taking? Authorizing Provider  albuterol (PROVENTIL HFA;VENTOLIN HFA) 108 (90 Base) MCG/ACT inhaler Inhale 2 puffs into the lungs every 6 (six) hours as needed. 01/09/18     Allayne Butcher B, PA-C  albuterol (PROVENTIL) (2.5 MG/3ML) 0.083% nebulizer solution Take 3 mLs (2.5 mg total) by nebulization every 6 (six) hours as needed. [id number] 01/10/18     Dixon, Patriciaann Clan, PA-C  ALPRAZolam (XANAX) 1 MG tablet TAKE ONE-HALF TABLET BY MOUTH ONCE DAILY AS NEEDED FOR STRESS Patient taking differently: Take 0.5-1 mg by mouth daily as needed for anxiety. 07/06/18     Donita Brooks, MD  amLODipine (NORVASC) 10 MG tablet TAKE 1 TABLET EVERY DAY 05/05/21     Yates Decamp, MD  atenolol (TENORMIN) 100 MG tablet TAKE 1 TABLET TWICE DAILY 05/05/21     Yates Decamp, MD  atorvastatin (LIPITOR) 40 MG tablet Take 1 tablet (40 mg total) daily by mouth. 08/01/17     Dorena Bodo, PA-C  augmented betamethasone dipropionate (DIPROLENE-AF) 0.05 % cream Apply 1 application topically 2 (two) times daily as needed (eczema). 03/13/21     [provider]  Biotin 29562 MCG TABS Take 10,000 mcg by mouth daily.       [provider]  carboxymethylcellulose (REFRESH PLUS) 0.5 % SOLN Place 1 drop into both eyes 3 (three) times daily as needed (dry eyes).       [provider]  Cholecalciferol (VITAMIN D) 2000 units CAPS Take 1 capsule (2,000 Units total) by mouth daily. 11/28/17     Dixon, Patriciaann Clan, PA-C  Cyanocobalamin (B-12) 5000 MCG CAPS Take 5,000 mcg by mouth daily.       [provider]  Glucose 15 g PACK Take 15-30 g by mouth daily as needed (low blood sugar).       [provider]  insulin aspart (NOVOLOG) 100 UNIT/ML injection Inject into the skin continuous. Uses in pump basal rate 3.0 units per hour, bolus 1 unit per 30 carbs       [provider]  levocetirizine (XYZAL ALLERGY 24HR) 5 MG tablet Take 1 tablet (5 mg total) by mouth every evening. 01/17/19     Danelle Berry, PA-C  levothyroxine (SYNTHROID) 125 MCG tablet Take 125 mcg by mouth daily before breakfast.       [provider]  losartan-hydrochlorothiazide (  HYZAAR) 100-25 MG tablet TAKE 1  TABLET EVERY MORNING 05/05/21     Yates Decamp, MD  Magnesium 400 MG TABS Take 400 mg by mouth daily.       [provider]  Melatonin 10 MG TABS Take 10 mg by mouth at bedtime.       [provider]  naproxen sodium (ANAPROX) 220 MG tablet Take 220 mg by mouth daily as needed (headaches).       [provider]  omeprazole (PRILOSEC) 40 MG capsule Take 40 mg by mouth daily.       [provider]  OVER THE COUNTER MEDICATION Take 4 tablets by mouth at bedtime. Focus factor otc supplement       [provider]  potassium chloride SA (K-DUR,KLOR-CON) 20 MEQ tablet Take 1 tablet (20 mEq total) by mouth 2 (two) times daily. 11/28/17     Dorena Bodo, PA-C  Probiotic Product (PROBIOTIC PO) Take 1 capsule by mouth daily.       [provider]  rOPINIRole (REQUIP) 0.5 MG tablet Take 0.5 mg by mouth at bedtime.       [provider]  sertraline (ZOLOFT) 100 MG tablet Take 100 mg by mouth daily.       [provider]  simethicone (GAS-X) 80 MG chewable tablet Chew 1 tablet (80 mg total) by mouth 4 (four) times daily as needed for flatulence. 02/13/18 04/28/21   Calvert Cantor, MD            Current Outpatient Medications  Medication Sig Dispense Refill   albuterol (PROVENTIL HFA;VENTOLIN HFA) 108 (90 Base) MCG/ACT inhaler Inhale 2 puffs into the lungs every 6 (six) hours as needed. 8.5 g 11   albuterol (PROVENTIL) (2.5 MG/3ML) 0.083% nebulizer solution Take 3 mLs (2.5 mg total) by nebulization every 6 (six) hours as needed. ICD10:J45.909 75 mL 1   ALPRAZolam (XANAX) 1 MG tablet TAKE ONE-HALF TABLET BY MOUTH ONCE DAILY AS NEEDED FOR STRESS (Patient taking differently: Take 0.5-1 mg by mouth daily as needed for anxiety.) 90 tablet 0   amLODipine (NORVASC) 10 MG tablet TAKE 1 TABLET EVERY DAY 90 tablet 3   atenolol (TENORMIN) 100 MG tablet TAKE 1 TABLET TWICE DAILY 180 tablet 3   atorvastatin (LIPITOR) 40 MG tablet Take 1 tablet (40 mg total)  daily by mouth. 90 tablet 3   augmented betamethasone dipropionate (DIPROLENE-AF) 0.05 % cream Apply 1 application topically 2 (two) times daily as needed (eczema).       Biotin 91478 MCG TABS Take 10,000 mcg by mouth daily.       carboxymethylcellulose (REFRESH PLUS) 0.5 % SOLN Place 1 drop into both eyes 3 (three) times daily as needed (dry eyes).       Cholecalciferol (VITAMIN D) 2000 units CAPS Take 1 capsule (2,000 Units total) by mouth daily. 30 capsule 3   Cyanocobalamin (B-12) 5000 MCG CAPS Take 5,000 mcg by mouth daily.       Glucose 15 g PACK Take 15-30 g by mouth daily as needed (low blood sugar).       insulin aspart (NOVOLOG) 100 UNIT/ML injection Inject into the skin continuous. Uses in pump basal rate 3.0 units per hour, bolus 1 unit per 30 carbs       levocetirizine (XYZAL ALLERGY 24HR) 5 MG tablet Take 1 tablet (5 mg total) by mouth every evening. 30 tablet 0   levothyroxine (SYNTHROID) 125 MCG tablet Take 125 mcg by mouth daily before  breakfast.       losartan-hydrochlorothiazide (HYZAAR) 100-25 MG tablet TAKE 1 TABLET EVERY MORNING 90 tablet 3   Magnesium 400 MG TABS Take 400 mg by mouth daily.       Melatonin 10 MG TABS Take 10 mg by mouth at bedtime.       naproxen sodium (ANAPROX) 220 MG tablet Take 220 mg by mouth daily as needed (headaches).       omeprazole (PRILOSEC) 40 MG capsule Take 40 mg by mouth daily.       OVER THE COUNTER MEDICATION Take 4 tablets by mouth at bedtime. Focus factor otc supplement       potassium chloride SA (K-DUR,KLOR-CON) 20 MEQ tablet Take 1 tablet (20 mEq total) by mouth 2 (two) times daily. 180 tablet 3   Probiotic Product (PROBIOTIC PO) Take 1 capsule by mouth daily.       rOPINIRole (REQUIP) 0.5 MG tablet Take 0.5 mg by mouth at bedtime.       sertraline (ZOLOFT) 100 MG tablet Take 100 mg by mouth daily.       simethicone (GAS-X) 80 MG chewable tablet Chew 1 tablet (80 mg total) by mouth 4 (four) times daily as needed for flatulence. 100  tablet 2    No current facility-administered medications for this visit.           Allergies  Allergen Reactions   Penicillins Anaphylaxis      Has patient had a PCN reaction causing immediate rash, facial/tongue/throat swelling, SOB or lightheadedness with hypotension: No Has patient had a PCN reaction causing severe rash involving mucus membranes or skin necrosis: No Has patient had a PCN reaction that required hospitalization: Yes/ Has patient had a PCN reaction occurring within the last 10 years: No If all of the above answers are "NO", then may proceed with Cephalosporin use.     Ace Inhibitors        INDUCED BRONCHOSPASM          Review of Systems:               General:                      normal appetite, + decreased energy, no weight gain, no weight loss, no fever             Cardiac:                       + chest pain with exertion, + chest pain at rest, +SOB with low level exertion, + resting SOB, no PND, + orthopnea, + palpitations, + arrhythmia, no atrial fibrillation, no LE edema, no dizzy spells, no syncope             Respiratory:                 +  shortness of breath, no home oxygen, no productive cough, no dry cough, no bronchitis, no wheezing, no hemoptysis, + asthma, no pain with inspiration or cough,  sleep apnea, + CPAP at night             GI:                               + difficulty swallowing, + reflux, no frequent heartburn, no hiatal hernia, no abdominal pain, no constipation, no diarrhea, no hematochezia, no hematemesis, no melena  GU:                              no dysuria,  no frequency, no urinary tract infection, no hematuria, no kidney stones, no kidney disease             Vascular:                     no pain suggestive of claudication, no pain in feet, no leg cramps, no varicose veins, no DVT, no non-healing foot ulcer             Neuro:                         no stroke, no TIA's, no seizures, + headaches, no temporary blindness one  eye,  no slurred speech, no peripheral neuropathy, no chronic pain, no instability of gait, no memory/cognitive dysfunction             Musculoskeletal:         + arthritis, no joint swelling, no myalgias, no difficulty walking, normal mobility              Skin:                            no rash, no itching, no skin infections, no pressure sores or ulcerations             Psych:                         + anxiety, + depression, no nervousness, no unusual recent stress             Eyes:                           no blurry vision, no floaters, no recent vision changes, + wears glasses              ENT:                            no hearing loss, no loose or painful teeth, + partial dentures, last saw dentist 6 months ago             Hematologic:               no easy bruising, no abnormal bleeding, no clotting disorder, no frequent epistaxis             Endocrine:                   + diabetes on insulin pump, does  check CBG's at home                            Physical Exam:               Ht 5\' 3"  (1.6 m)   Wt 178 lb (80.7 kg)   BMI 31.53 kg/m              General:                      well-appearing  HEENT:                       Unremarkable, NCAT, PERLA, EOMI             Neck:                           no JVD, + bruits or transmitted murmur bilat, no adenopathy             Chest:                          clear to auscultation, symmetrical breath sounds, no wheezes, no rhonchi              CV:                              RRR, 3/6 systolic murmur RSB, no diastolic murmur             Abdomen:                    soft, non-tender, no masses              Extremities:                 warm, well-perfused, pulses palpable at ankle, + lower extremity edema             Rectal/GU                   Deferred             Neuro:                         Grossly non-focal and symmetrical throughout             Skin:                            Clean and dry, no rashes, no breakdown   Diagnostic  Tests:   Left Ventricle Normal LVEDV (A4C) 81.8 ml (46 - 106) LVEDV (A4C) / BSA 41.76 ml/m2 (29 - 61) LVESV (A4C) 34.8 ml (14 - 42) LVESV (A4C) / BSA 17.77 ml/m2 (8 - 24) LVEDV 128.8 ml LVESV 76.8 ml EF 45 % (54 - 74) LVSV 52.0 ml (81 - 109) LVSV / BSA 26.55 ml/m2 (18 - 46) IVSd 1.5 cm (0.6 - 0.9) LVIDd 5.2 cm (3.8 - 5.2) LVIDd / BSA 2.65 cm/m2 (2.4 - 3.2) LVPWd 1.5 cm (0.6 - 0.9) LV Mass 345.7 g (67 - 162) LV Mass / BSA 176.51 g/m2 (43 - 95) LV FS (midwall) 20 % (15 - 23) LVIDs 4.2 cm (2.2 - 3.5) LVRWT 0.58 LV Length D 7.1 cm LV length S 6.4 cm Left Atrium Normal LA 3.4 cm (2.7 - 3.8) LA / BSA 1.74 cm/m2 (1.5 - 2.3) LA Volume 65.0 ml (22 - 52) LA Volume / BSA 33.90 ml/m2 (16 - 34) Mitral Valve Normal MV Pk E Vel 0.9 m/s (0.7 - 1.2) MV Pk A Vel 0.8 m/s (0.4 - 0.7) MV E/A Ratio 1.10 (0.6 - 1.32) MV Decel Time 264.0 ms (142 - 258) Septal e' 5.02 cm/s (6.2 - 14.6) Septal E/e' 17.00 (<8) MR Vel 5.3 m/s MV Reg Pressure Gr 112.1 mmHg Tricuspid Valve  Normal TR Pk Vel 2.3 m/s TR Pk Grad 21.2 mmHg RVSP 24 mmHg (18 - 25) RAP 3.0 mmHg (<5) Aorta Normal AoD 3.8 cm Aortic Valve Normal LVOT Diam 2.2 cm (1.8 - 2.2) LVOT Pk Vel 0.60 m/s LVOT Pk Grad 1.5 mmHg LVOT VTI 19.4 cm (18 - 22) LVOT SV 52.0 ml (60 - 100) LVOT CO 2.5 l/min (4 - 8) AV Pk Vel 4.08 m/s AV Pk Grad 66.6 mmHg (<10) AV Mean Grad 41.0 mmHg (<=40) AV VTI 116.0 cm (18 - 22) AVA (Vmax) 0.56 cm2 (3 - 4) AVA (VTI) 0.6 cm2 (>1) AVA / BSA 0.32 cm2/m2 (>0.6) AV index 0.1 AI PHT 636.0 ms AR Vel 2.3 m/s AR Pk Grad 21.7 mmHg Pulmonary Valve Normal PV Pk Vel 0.9 m/s PV Pk Grad 3.0 mmHg PR Vel 1.0 m/s PR Pk Grad 4.3 mmHg Pulmonic Artery Normal LA / Ao 0.9 IVC/Pulmonic Vein Normal IVC Dim 1.9 cm (<2.1) Findings: 1. Left ventricle cavity is normal in size. Moderate concentric hypertrophy of the left ventricle. Normal global wall motion. Normal LV systolic function with visual EF 55-60%. Doppler evidence of  grade II (pseudonormal) diastolic dysfunction, elevated LAP. 2. Left atrial cavity is normal in size. 3. Right atrial cavity is normal in size. 4. Right ventricle cavity is normal in size. Normal right ventricular function. 5. Bicuspid aortic valve with raphe between left and right coronary cusp. Moderate calcification. Severe aortic stenosis. Vmax 4.0 m/sec, mean PG 4 mmHg, AVA 0.6 cm2 by continuity equation. Mild aortic regurgitation. 6. Mild (Grade I) mitral regurgitation. 7. Mild tricuspid regurgitation. Estimated pulmonary artery systolic pressure 24 mmHg. 8. Trace pulmonic regurgitation. 9. No evidence of significant pericardial effusion. 10. The aortic root is normal. 11. Normal pulmonary artery. 12. IVC is normal with respiratory variation. Normal right atrial pressure. 1. Left ventricle cavity is normal in size. Moderate concentric hypertrophy of the left ventricle. Normal global wall motion. Normal LV systolic function with visual EF 55-60%. Doppler evidence of grade II (pseudonormal) diastolic dysfunction, elevated LAP. 2. Bicuspid aortic valve with raphe between left and right coronary cusp. Moderate calcification. Severe aortic stenosis. Vmax 4.0 m/sec, mean PG 4 mmHg, AVA 0.6 cm2 by continuity equation. Mild aortic regurgitation. 3. Mild (Grade I) mitral regurgitation. 4. Mild tricuspid regurgitation. 5. No evidence of pulmonary hypertension. 6. Compared to previous studies in 2020 and 2021, aortic stenosis severity is increased from moderate. Conclusions: March 28, 2021 08:56 PM EDT Truett Mainland, MD, [facility] Electronically Signed on [facility]   Physicians   Panel Physicians Referring Physician Case Authorizing Physician  Yates Decamp, MD (Primary)        Procedures   RIGHT/LEFT HEART CATH AND CORONARY ANGIOGRAPHY    Conclusion   Right and left heart catheterization 04/28/2021: Normal coronary arteries, left dominant circulation. LV: 171/8, EDP  17.  Ao 104/46, mean 67 mmHg.  Peak to peak pressure gradient 50 and mean pressure gradient across the aortic valve of 44.6 mmHg.  Calculated aortic valve area 0.89 cm.  QP/QS 1.00. RA 16/13, mean 13; RV 35/1, EDP 9; PA 26/9, mean 14.  PA saturation 74%.  PW 23/24, mean 22 mmHg. CO 5.17, CI 2.83.  Normal.   Recommendation: Patient will be evaluated by TAVR team and consideration for aortic valve replacement.  30 mL contrast utilized.   Recommendations   Antiplatelet/Anticoag No indication for antiplatelet therapy at this time .    Indications   Dyspnea on exertion [R06.00 (ICD-10-CM)]  Severe aortic stenosis [I35.0 (ICD-10-CM)]  Procedural Details   Technical Details Procedure performed:  Left heart catheterization including hemodynamic monitoring of the left ventricle, LV gram, selective right and left coronary arteriography. Right heart catheterization and cannulation of cardiac output and cardiac index by Fick.  Indication: JAKAILA NORMENT  is a 70 y.o. female  with history of hypertension,  hyperlipidemia,  Diabetes Mellitus   who presents with worsening dyspnea, recent echo demonstrated increasing PG across AV and severe AS. Hence is brought to the cardiac catheterization lab to evaluate the  coronary anatomy for definitive diagnosis of CAD.  Right AC vein access for right heart and Right radial access for left heart catheterization was utilized for performing the procedure.   Technique:   A 5 French  sheath introduced into right right AC  vein for access under fluroscopic guidance.  A 5 French Swan-Ganz catheter was advanced with balloon inflated  under fluoroscopic guidance into first the right atrium followed by the right ventricle and into the pulmonary artery to pulmonary artery wedge position. Hemodynamics were obtained in a locations.  After hemodynamics were completed, samples were taken for SaO2% measurement to be used in Fick  Calculation of cardiac output and cardiac  index. The catheter was then pulled back the balloon down and then completely out of the body. Hemostasis obtained by manual pressure over the access site.  Under sterile precautions using a 6 French right radial  arterial access, a 6 French sheath was introduced into the right radial artery. A 5 [place] Tig 4 catheter was advanced into the ascending aorta selective  right coronary artery and left coronary artery was cannulated and angiography was performed in multiple views. The catheter was pulled back Out of the body over exchange length J-wire.  Same catheter was used to perform LV gram hemodynamics. Catheter exchanged out of the body over J-Wire. NO immediate complications noted. Patient tolerated the procedure well. Contrast used: 25mL.   Estimated blood loss <50 mL.   During this procedure medications were administered to achieve and maintain moderate conscious sedation while the patient's heart rate, blood pressure, and oxygen saturation were continuously monitored and I was present face-to-face 100% of this time.    Medications (Filter: Administrations occurring from 0730 to 0827 on 04/28/21)  important  Continuous medications are totaled by the amount administered until 04/28/21 0827.    midazolam (VERSED) injection (mg) Total dose:  2 mg Date/Time Rate/Dose/Volume Action    04/28/21 0749 1 mg Given    0754 1 mg Given      fentaNYL (SUBLIMAZE) injection (mcg) Total dose:  25 mcg Date/Time Rate/Dose/Volume Action    04/28/21 0749 25 mcg Given      lidocaine (PF) (XYLOCAINE) 1 % injection (mL) Total volume:  4 mL Date/Time Rate/Dose/Volume Action    04/28/21 0755 2 mL Given    0755 2 mL Given      Radial Cocktail/Verapamil only (mL) Total volume:  5 mL Date/Time Rate/Dose/Volume Action    04/28/21 0809 5 mL Given      heparin sodium (porcine) injection (Units) Total dose:  4,000 Units Date/Time Rate/Dose/Volume Action    04/28/21 0814 4,000 Units Given      iohexol  (OMNIPAQUE) 350 MG/ML injection (mL) Total volume:  35 mL Date/Time Rate/Dose/Volume Action    04/28/21 0823 35 mL Given      Heparin (Porcine) in NaCl 1000-0.9 UT/500ML-% SOLN (mL) Total volume:  1,000 mL Date/Time Rate/Dose/Volume Action    04/28/21 0813 500 mL Given  0814 500 mL Given      Sedation Time   Sedation Time Physician-1: 30 minutes Contrast   Medication Name Total Dose  iohexol (OMNIPAQUE) 350 MG/ML injection 35 mL    Radiation/Fluoro   Fluoro time: 6.8 (min) DAP: 6899 (mGycm2) Cumulative Air Kerma: 129 (mGy) Complications      Complications documented before study signed (04/28/2021  8:44 AM)     No complications were associated with this study.  Documented by Yates Decamp, MD - 04/28/2021  8:43 AM      Coronary Findings   Diagnostic Dominance: Left Left Main  Vessel was injected. Vessel is normal in caliber. Vessel is angiographically normal.  Left Anterior Descending  Vessel was injected. Vessel is normal in caliber. Vessel is angiographically normal.  Left Circumflex  Vessel was injected. Vessel is normal in caliber. Vessel is angiographically normal.  Right Coronary Artery  Vessel was injected. Vessel is normal in caliber. Vessel is angiographically normal.  Intervention   No interventions have been documented. Right Heart   Right Heart Pressures Hemodynamic findings consistent with mild pulmonary hypertension. Elevated LV EDP consistent with volume overload.    Left Heart   Left Ventricle LV end diastolic pressure is mildly elevated.    Coronary Diagrams   Diagnostic Dominance: Left Intervention   Implants      No implant documentation for this case.    Syng[employer] Images    Show images for CARDIAC CATHETERIZATION Images on Long Term Storage    Show images for [place], [place] to Procedure Log   Procedure Log    Hemo Data   Flowsheet Row Most Recent Value  Fick Cardiac Output 5.17 L/min  Fick Cardiac Output Index 2.83  (L/min)/BSA  Aortic Mean Gradient 44.6 mmHg  Aortic Peak Gradient 50 mmHg  Aortic Valve Area 0.89  Aortic Value Area Index 0.49 cm2/BSA  RA A Wave 16 mmHg  RA V Wave 13 mmHg  RA Mean 13 mmHg  RV Systolic Pressure 35 mmHg  RV Diastolic Pressure 1 mmHg  RV EDP 9 mmHg  PA Systolic Pressure 26 mmHg  PA Diastolic Pressure 9 mmHg  PA Mean 14 mmHg  PW A Wave 23 mmHg  PW V Wave 24 mmHg  PW Mean 22 mmHg  AO Systolic Pressure 104 mmHg  AO Diastolic Pressure 46 mmHg  AO Mean 67 mmHg  LV Systolic Pressure 171 mmHg  LV Diastolic Pressure 8 mmHg  LV EDP 17 mmHg  AOp Systolic Pressure 124 mmHg  AOp Diastolic Pressure 37 mmHg  AOp Mean Pressure 73 mmHg  LVp Systolic Pressure 174 mmHg  LVp Diastolic Pressure 11 mmHg  LVp EDP Pressure 24 mmHg  QP/QS 1  TPVR Index 4.94 HRUI  TSVR Index 23.63 HRUI  TPVR/TSVR Ratio 0.21    Narrative & Impression  CLINICAL DATA:  Aortic Stenosis   EXAM: Cardiac TAVR CT   TECHNIQUE: The patient was scanned on a Siemens Force 192 slice scanner. A 120 kV retrospective scan was triggered in the ascending thoracic aorta at 140 HU's. Gantry rotation speed was 250 msecs and collimation was .6 mm. No beta blockade or nitro were given. The 3D data set was reconstructed in 5% intervals of the R-R cycle. Systolic and diastolic phases were analyzed on a dedicated work station using MPR, MIP and VRT modes. The patient received 80 cc of contrast.   FINDINGS: Aortic Valve: Calcium score 2929 Bicuspid Sievers Type 1 with raphe between right and left cusp   Aorta:  MIld calcific atherosclerosis no aneurysm or coarctation normal arch vessels   Sino-tubular Junction: 27 mm   Ascending Thoracic Aorta: 37 mm   Aortic Arch: 23 mm   Descending Thoracic Aorta: 24 mm   Sinus of Valsalva Measurements:   Non cusp 33.5 mm   Left cusp 35.3 mm   Right cusp 32/7 mm   Coronary Artery Height above Annulus:   Left Main: 14.6 mm above annulus   Right Coronary: 15.3  mm above annulus small non dominant vessel   Virtual Basal Annulus Measurements:   Maximum / Minimum Diameter: 27.5 mm x 33.2 mm   Perimeter: 96.8 mm   Area: 690 mm 2   Coronary Arteries: Left dominant sufficient height above annulus for deployment   Optimum Fluoroscopic Angle for Delivery: LAO 15 Caudal 16   IMPRESSION: 1. Calcified bicuspid Sievers Type one valve with raphe between right/left cusps Score 2929   2. Annular area 690 mm2 upper limit for 29 mm may consider adding cc's to inflation   3.  No aortic aneurysms normal arch vessels ascending aorta 3.7 cm   4. Left dominant coronary arteries sufficient height above annulus for deployment   5. Optimum angiographic angle for deployment LAO 15 Caudal 16 degrees   Charlton Haws   Electronically Signed: By: Charlton Haws M.D. On: 05/06/2021 12:03      STS RISK CALCULATOR: Isolated AVR: Risk of Mortality: 2.185% Renal Failure: 1.522% Permanent Stroke: 1.846% Prolonged Ventilation: 5.944% DSW Infection: 0.136% Reoperation: 2.981% Morbidity or Mortality: 10.517% Short Length of Stay: 35.458% Long Length of Stay: 5.210%   Impression:   This 70 year old woman has stage D, severe, symptomatic aortic stenosis with New York Heart Association class III symptoms of exertional fatigue and shortness of breath consistent with chronic diastolic congestive heart failure.  She also has substernal chest pressure with exertion as well as at rest when laying down and has orthopnea.  I have personally reviewed her 2D echocardiogram, cardiac catheterization, and CTA studies.  Her echocardiogram shows a bicuspid aortic valve with moderate calcification and thickening of the leaflets and restricted leaflet mobility.  The mean gradient is 41 mmHg consistent with severe aortic stenosis.  Left ventricular ejection fraction is normal.  Cardiac catheterization showed normal coronary arteries with a mean gradient across aortic valve  of 45 mmHg and a peak to peak gradient of 50 mmHg consistent with severe aortic stenosis.  I agree that aortic valve replacement is indicated in this patient for relief of her symptoms and to prevent progressive left ventricular deterioration.  I discussed the alternatives of open surgical aortic valve replacement and transcatheter aortic valve replacement.  She said that she has not interested in having TAVR and would rather have open surgical aortic valve replacement.  She is concerned about lack of long-term durability data and she had a friend who had TAVR that had a difficult time.  She does have a calcified bicuspid aortic valve that is at the upper limit of the range for a 29 mm SAPIEN valve.  I think that open surgical aortic valve replacement is a reasonable option for treating her.  I would use a bioprosthetic valve given her age. I discussed the operative procedure with the patient and family including alternatives, benefits and risks; including but not limited to bleeding, blood transfusion, infection, stroke, myocardial infarction, graft failure, heart block requiring a permanent pacemaker, organ dysfunction, and death.  Brand Males understands and agrees to proceed.     Plan:  Open surgical aortic valve replacement using a bioprosthetic valve.      Alleen Borne, MD

## 2021-06-05 ENCOUNTER — Encounter (HOSPITAL_COMMUNITY): Payer: Self-pay | Admitting: Surgery

## 2021-06-05 ENCOUNTER — Inpatient Hospital Stay (HOSPITAL_COMMUNITY): Payer: Medicare Other

## 2021-06-05 ENCOUNTER — Inpatient Hospital Stay (HOSPITAL_COMMUNITY): Payer: Medicare Other | Admitting: Vascular Surgery

## 2021-06-05 ENCOUNTER — Other Ambulatory Visit: Payer: Self-pay

## 2021-06-05 ENCOUNTER — Encounter (HOSPITAL_COMMUNITY)
Admission: RE | Disposition: A | Discharge status: Home Health | Payer: Self-pay | Source: Home / Self Care | Attending: Surgery

## 2021-06-05 ENCOUNTER — Inpatient Hospital Stay (HOSPITAL_COMMUNITY)
Admission: RE | Admit: 2021-06-05 | Discharge: 2021-06-13 | DRG: 220 | Disposition: A | Discharge status: Home Health | Payer: Medicare Other | Attending: Surgery | Admitting: Surgery

## 2021-06-05 ENCOUNTER — Inpatient Hospital Stay (HOSPITAL_COMMUNITY): Payer: Medicare Other | Admitting: Anesthesiology

## 2021-06-05 DIAGNOSIS — F32A Depression, unspecified: Secondary | ICD-10-CM | POA: Diagnosis present

## 2021-06-05 DIAGNOSIS — Z90711 Acquired absence of uterus with remaining cervical stump: Secondary | ICD-10-CM | POA: Diagnosis not present

## 2021-06-05 DIAGNOSIS — E119 Type 2 diabetes mellitus without complications: Secondary | ICD-10-CM | POA: Diagnosis present

## 2021-06-05 DIAGNOSIS — M7989 Other specified soft tissue disorders: Secondary | ICD-10-CM | POA: Diagnosis not present

## 2021-06-05 DIAGNOSIS — Z4682 Encounter for fitting and adjustment of non-vascular catheter: Secondary | ICD-10-CM | POA: Diagnosis not present

## 2021-06-05 DIAGNOSIS — Z809 Family history of malignant neoplasm, unspecified: Secondary | ICD-10-CM | POA: Diagnosis not present

## 2021-06-05 DIAGNOSIS — Z006 Encounter for examination for normal comparison and control in clinical research program: Secondary | ICD-10-CM | POA: Diagnosis not present

## 2021-06-05 DIAGNOSIS — I440 Atrioventricular block, first degree: Secondary | ICD-10-CM | POA: Diagnosis present

## 2021-06-05 DIAGNOSIS — Z833 Family history of diabetes mellitus: Secondary | ICD-10-CM | POA: Diagnosis not present

## 2021-06-05 DIAGNOSIS — I471 Supraventricular tachycardia: Secondary | ICD-10-CM | POA: Diagnosis not present

## 2021-06-05 DIAGNOSIS — E8770 Fluid overload, unspecified: Secondary | ICD-10-CM | POA: Diagnosis present

## 2021-06-05 DIAGNOSIS — K5900 Constipation, unspecified: Secondary | ICD-10-CM | POA: Diagnosis present

## 2021-06-05 DIAGNOSIS — Z20822 Contact with and (suspected) exposure to covid-19: Secondary | ICD-10-CM | POA: Diagnosis not present

## 2021-06-05 DIAGNOSIS — K219 Gastro-esophageal reflux disease without esophagitis: Secondary | ICD-10-CM | POA: Diagnosis present

## 2021-06-05 DIAGNOSIS — D62 Acute posthemorrhagic anemia: Secondary | ICD-10-CM | POA: Diagnosis not present

## 2021-06-05 DIAGNOSIS — I480 Paroxysmal atrial fibrillation: Secondary | ICD-10-CM | POA: Diagnosis present

## 2021-06-05 DIAGNOSIS — I10 Essential (primary) hypertension: Secondary | ICD-10-CM | POA: Diagnosis present

## 2021-06-05 DIAGNOSIS — J90 Pleural effusion, not elsewhere classified: Secondary | ICD-10-CM | POA: Diagnosis not present

## 2021-06-05 DIAGNOSIS — E039 Hypothyroidism, unspecified: Secondary | ICD-10-CM | POA: Diagnosis not present

## 2021-06-05 DIAGNOSIS — I352 Nonrheumatic aortic (valve) stenosis with insufficiency: Principal | ICD-10-CM | POA: Diagnosis present

## 2021-06-05 DIAGNOSIS — J45909 Unspecified asthma, uncomplicated: Secondary | ICD-10-CM | POA: Diagnosis present

## 2021-06-05 DIAGNOSIS — I358 Other nonrheumatic aortic valve disorders: Secondary | ICD-10-CM | POA: Diagnosis not present

## 2021-06-05 DIAGNOSIS — I350 Nonrheumatic aortic (valve) stenosis: Secondary | ICD-10-CM | POA: Diagnosis not present

## 2021-06-05 DIAGNOSIS — Z954 Presence of other heart-valve replacement: Secondary | ICD-10-CM

## 2021-06-05 DIAGNOSIS — I272 Pulmonary hypertension, unspecified: Secondary | ICD-10-CM | POA: Diagnosis present

## 2021-06-05 DIAGNOSIS — Z794 Long term (current) use of insulin: Secondary | ICD-10-CM | POA: Diagnosis not present

## 2021-06-05 DIAGNOSIS — M79601 Pain in right arm: Secondary | ICD-10-CM | POA: Diagnosis not present

## 2021-06-05 DIAGNOSIS — E785 Hyperlipidemia, unspecified: Secondary | ICD-10-CM | POA: Diagnosis present

## 2021-06-05 DIAGNOSIS — I340 Nonrheumatic mitral (valve) insufficiency: Secondary | ICD-10-CM | POA: Diagnosis present

## 2021-06-05 DIAGNOSIS — Z952 Presence of prosthetic heart valve: Secondary | ICD-10-CM | POA: Diagnosis not present

## 2021-06-05 DIAGNOSIS — Z811 Family history of alcohol abuse and dependence: Secondary | ICD-10-CM

## 2021-06-05 DIAGNOSIS — J9811 Atelectasis: Secondary | ICD-10-CM | POA: Diagnosis not present

## 2021-06-05 DIAGNOSIS — J939 Pneumothorax, unspecified: Secondary | ICD-10-CM

## 2021-06-05 DIAGNOSIS — M797 Fibromyalgia: Secondary | ICD-10-CM | POA: Diagnosis present

## 2021-06-05 DIAGNOSIS — Z79899 Other long term (current) drug therapy: Secondary | ICD-10-CM | POA: Diagnosis not present

## 2021-06-05 DIAGNOSIS — E139 Other specified diabetes mellitus without complications: Secondary | ICD-10-CM

## 2021-06-05 DIAGNOSIS — Z9989 Dependence on other enabling machines and devices: Secondary | ICD-10-CM | POA: Diagnosis not present

## 2021-06-05 DIAGNOSIS — Z9641 Presence of insulin pump (external) (internal): Secondary | ICD-10-CM | POA: Diagnosis present

## 2021-06-05 DIAGNOSIS — F419 Anxiety disorder, unspecified: Secondary | ICD-10-CM | POA: Diagnosis present

## 2021-06-05 DIAGNOSIS — G4733 Obstructive sleep apnea (adult) (pediatric): Secondary | ICD-10-CM | POA: Diagnosis not present

## 2021-06-05 HISTORY — PX: AORTIC VALVE REPLACEMENT: SHX41

## 2021-06-05 HISTORY — PX: TEE WITHOUT CARDIOVERSION: SHX5443

## 2021-06-05 LAB — PROTIME-INR
INR: 1 (ref 0.8–1.2)
INR: 1.3 — ABNORMAL HIGH (ref 0.8–1.2)
Prothrombin Time: 13 seconds (ref 11.4–15.2)
Prothrombin Time: 16.5 seconds — ABNORMAL HIGH (ref 11.4–15.2)

## 2021-06-05 LAB — POCT I-STAT 7, (LYTES, BLD GAS, ICA,H+H)
Acid-Base Excess: 1 mmol/L (ref 0.0–2.0)
Acid-Base Excess: 0 mmol/L (ref 0.0–2.0)
Acid-Base Excess: 2 mmol/L (ref 0.0–2.0)
Acid-Base Excess: 0 mmol/L (ref 0.0–2.0)
Acid-Base Excess: 0 mmol/L (ref 0.0–2.0)
Acid-base deficit: 3 mmol/L — ABNORMAL HIGH (ref 0.0–2.0)
Acid-base deficit: 3 mmol/L — ABNORMAL HIGH (ref 0.0–2.0)
Bicarbonate: 27.9 mmol/L (ref 20.0–28.0)
Bicarbonate: 24.9 mmol/L (ref 20.0–28.0)
Bicarbonate: 26.4 mmol/L (ref 20.0–28.0)
Bicarbonate: 25.3 mmol/L (ref 20.0–28.0)
Bicarbonate: 22.8 mmol/L (ref 20.0–28.0)
Bicarbonate: 24.3 mmol/L (ref 20.0–28.0)
Bicarbonate: 27.4 mmol/L (ref 20.0–28.0)
Calcium, Ion: 1.31 mmol/L (ref 1.15–1.40)
Calcium, Ion: 0.95 mmol/L — ABNORMAL LOW (ref 1.15–1.40)
Calcium, Ion: 1.11 mmol/L — ABNORMAL LOW (ref 1.15–1.40)
Calcium, Ion: 1.12 mmol/L — ABNORMAL LOW (ref 1.15–1.40)
Calcium, Ion: 1.15 mmol/L (ref 1.15–1.40)
Calcium, Ion: 1.19 mmol/L (ref 1.15–1.40)
Calcium, Ion: 1.17 mmol/L (ref 1.15–1.40)
HCT: 37 % (ref 36.0–46.0)
HCT: 26 % — ABNORMAL LOW (ref 36.0–46.0)
HCT: 30 % — ABNORMAL LOW (ref 36.0–46.0)
HCT: 30 % — ABNORMAL LOW (ref 36.0–46.0)
HCT: 37 % (ref 36.0–46.0)
HCT: 36 % (ref 36.0–46.0)
HCT: 37 % (ref 36.0–46.0)
Hemoglobin: 12.6 g/dL (ref 12.0–15.0)
Hemoglobin: 8.8 g/dL — ABNORMAL LOW (ref 12.0–15.0)
Hemoglobin: 10.2 g/dL — ABNORMAL LOW (ref 12.0–15.0)
Hemoglobin: 10.2 g/dL — ABNORMAL LOW (ref 12.0–15.0)
Hemoglobin: 12.6 g/dL (ref 12.0–15.0)
Hemoglobin: 12.2 g/dL (ref 12.0–15.0)
Hemoglobin: 12.6 g/dL (ref 12.0–15.0)
O2 Saturation: 100 %
O2 Saturation: 100 %
O2 Saturation: 100 %
O2 Saturation: 99 %
O2 Saturation: 96 %
O2 Saturation: 92 %
O2 Saturation: 92 %
Patient temperature: 35.4
Patient temperature: 36.5
Patient temperature: 37
Potassium: 3.1 mmol/L — ABNORMAL LOW (ref 3.5–5.1)
Potassium: 3.7 mmol/L (ref 3.5–5.1)
Potassium: 4 mmol/L (ref 3.5–5.1)
Potassium: 3.9 mmol/L (ref 3.5–5.1)
Potassium: 3.5 mmol/L (ref 3.5–5.1)
Potassium: 4.3 mmol/L (ref 3.5–5.1)
Potassium: 4.2 mmol/L (ref 3.5–5.1)
Sodium: 142 mmol/L (ref 135–145)
Sodium: 141 mmol/L (ref 135–145)
Sodium: 138 mmol/L (ref 135–145)
Sodium: 138 mmol/L (ref 135–145)
Sodium: 141 mmol/L (ref 135–145)
Sodium: 141 mmol/L (ref 135–145)
Sodium: 141 mmol/L (ref 135–145)
TCO2: 30 mmol/L (ref 22–32)
TCO2: 26 mmol/L (ref 22–32)
TCO2: 28 mmol/L (ref 22–32)
TCO2: 26 mmol/L (ref 22–32)
TCO2: 24 mmol/L (ref 22–32)
TCO2: 26 mmol/L (ref 22–32)
TCO2: 29 mmol/L (ref 22–32)
pCO2 arterial: 55.1 mmHg — ABNORMAL HIGH (ref 32.0–48.0)
pCO2 arterial: 39.5 mmHg (ref 32.0–48.0)
pCO2 arterial: 40.6 mmHg (ref 32.0–48.0)
pCO2 arterial: 41.1 mmHg (ref 32.0–48.0)
pCO2 arterial: 39.5 mmHg (ref 32.0–48.0)
pCO2 arterial: 49.5 mmHg — ABNORMAL HIGH (ref 32.0–48.0)
pCO2 arterial: 55 mmHg — ABNORMAL HIGH (ref 32.0–48.0)
pH, Arterial: 7.312 — ABNORMAL LOW (ref 7.350–7.450)
pH, Arterial: 7.407 (ref 7.350–7.450)
pH, Arterial: 7.421 (ref 7.350–7.450)
pH, Arterial: 7.397 (ref 7.350–7.450)
pH, Arterial: 7.362 (ref 7.350–7.450)
pH, Arterial: 7.297 — ABNORMAL LOW (ref 7.350–7.450)
pH, Arterial: 7.306 — ABNORMAL LOW (ref 7.350–7.450)
pO2, Arterial: 451 mmHg — ABNORMAL HIGH (ref 83.0–108.0)
pO2, Arterial: 412 mmHg — ABNORMAL HIGH (ref 83.0–108.0)
pO2, Arterial: 359 mmHg — ABNORMAL HIGH (ref 83.0–108.0)
pO2, Arterial: 165 mmHg — ABNORMAL HIGH (ref 83.0–108.0)
pO2, Arterial: 80 mmHg — ABNORMAL LOW (ref 83.0–108.0)
pO2, Arterial: 70 mmHg — ABNORMAL LOW (ref 83.0–108.0)
pO2, Arterial: 72 mmHg — ABNORMAL LOW (ref 83.0–108.0)

## 2021-06-05 LAB — BASIC METABOLIC PANEL
Anion gap: 7 (ref 5–15)
BUN: 13 mg/dL (ref 8–23)
CO2: 25 mmol/L (ref 22–32)
Calcium: 7.9 mg/dL — ABNORMAL LOW (ref 8.9–10.3)
Chloride: 106 mmol/L (ref 98–111)
Creatinine, Ser: 0.76 mg/dL (ref 0.44–1.00)
GFR, Estimated: >60 mL/min (ref >60)
Glucose, Bld: 150 mg/dL — ABNORMAL HIGH (ref 70–99)
Potassium: 4 mmol/L (ref 3.5–5.1)
Sodium: 138 mmol/L (ref 135–145)

## 2021-06-05 LAB — POCT I-STAT, CHEM 8
BUN: 21 mg/dL (ref 8–23)
BUN: 20 mg/dL (ref 8–23)
BUN: 18 mg/dL (ref 8–23)
BUN: 18 mg/dL (ref 8–23)
BUN: 19 mg/dL (ref 8–23)
Calcium, Ion: 1.31 mmol/L (ref 1.15–1.40)
Calcium, Ion: 1.22 mmol/L (ref 1.15–1.40)
Calcium, Ion: 1.1 mmol/L — ABNORMAL LOW (ref 1.15–1.40)
Calcium, Ion: 1.04 mmol/L — ABNORMAL LOW (ref 1.15–1.40)
Calcium, Ion: 1.1 mmol/L — ABNORMAL LOW (ref 1.15–1.40)
Chloride: 103 mmol/L (ref 98–111)
Chloride: 103 mmol/L (ref 98–111)
Chloride: 102 mmol/L (ref 98–111)
Chloride: 100 mmol/L (ref 98–111)
Chloride: 101 mmol/L (ref 98–111)
Creatinine, Ser: 0.8 mg/dL (ref 0.44–1.00)
Creatinine, Ser: 0.8 mg/dL (ref 0.44–1.00)
Creatinine, Ser: 0.8 mg/dL (ref 0.44–1.00)
Creatinine, Ser: 0.7 mg/dL (ref 0.44–1.00)
Creatinine, Ser: 1 mg/dL (ref 0.44–1.00)
Glucose, Bld: 94 mg/dL (ref 70–99)
Glucose, Bld: 118 mg/dL — ABNORMAL HIGH (ref 70–99)
Glucose, Bld: 117 mg/dL — ABNORMAL HIGH (ref 70–99)
Glucose, Bld: 156 mg/dL — ABNORMAL HIGH (ref 70–99)
Glucose, Bld: 186 mg/dL — ABNORMAL HIGH (ref 70–99)
HCT: 40 % (ref 36.0–46.0)
HCT: 34 % — ABNORMAL LOW (ref 36.0–46.0)
HCT: 29 % — ABNORMAL LOW (ref 36.0–46.0)
HCT: 26 % — ABNORMAL LOW (ref 36.0–46.0)
HCT: 29 % — ABNORMAL LOW (ref 36.0–46.0)
Hemoglobin: 13.6 g/dL (ref 12.0–15.0)
Hemoglobin: 11.6 g/dL — ABNORMAL LOW (ref 12.0–15.0)
Hemoglobin: 9.9 g/dL — ABNORMAL LOW (ref 12.0–15.0)
Hemoglobin: 8.8 g/dL — ABNORMAL LOW (ref 12.0–15.0)
Hemoglobin: 9.9 g/dL — ABNORMAL LOW (ref 12.0–15.0)
Potassium: 3.1 mmol/L — ABNORMAL LOW (ref 3.5–5.1)
Potassium: 3.2 mmol/L — ABNORMAL LOW (ref 3.5–5.1)
Potassium: 3.7 mmol/L (ref 3.5–5.1)
Potassium: 3.9 mmol/L (ref 3.5–5.1)
Potassium: 4.5 mmol/L (ref 3.5–5.1)
Sodium: 143 mmol/L (ref 135–145)
Sodium: 139 mmol/L (ref 135–145)
Sodium: 138 mmol/L (ref 135–145)
Sodium: 135 mmol/L (ref 135–145)
Sodium: 138 mmol/L (ref 135–145)
TCO2: 26 mmol/L (ref 22–32)
TCO2: 25 mmol/L (ref 22–32)
TCO2: 26 mmol/L (ref 22–32)
TCO2: 25 mmol/L (ref 22–32)
TCO2: 26 mmol/L (ref 22–32)

## 2021-06-05 LAB — POCT I-STAT EG7
Acid-Base Excess: 0 mmol/L (ref 0.0–2.0)
Bicarbonate: 25.5 mmol/L (ref 20.0–28.0)
Calcium, Ion: 1.07 mmol/L — ABNORMAL LOW (ref 1.15–1.40)
HCT: 28 % — ABNORMAL LOW (ref 36.0–46.0)
Hemoglobin: 9.5 g/dL — ABNORMAL LOW (ref 12.0–15.0)
O2 Saturation: 75 %
Potassium: 3 mmol/L — ABNORMAL LOW (ref 3.5–5.1)
Sodium: 142 mmol/L (ref 135–145)
TCO2: 27 mmol/L (ref 22–32)
pCO2, Ven: 43.6 mmHg — ABNORMAL LOW (ref 44.0–60.0)
pH, Ven: 7.374 (ref 7.250–7.430)
pO2, Ven: 42 mmHg (ref 32.0–45.0)

## 2021-06-05 LAB — ECHO INTRAOPERATIVE TEE
AV Mean grad: 36 mmHg
Height: 63 in
Weight: 2800 oz

## 2021-06-05 LAB — CBC
HCT: 37.6 % (ref 36.0–46.0)
HCT: 36.4 % (ref 36.0–46.0)
Hemoglobin: 12.5 g/dL (ref 12.0–15.0)
Hemoglobin: 12.1 g/dL (ref 12.0–15.0)
MCH: 28.3 pg (ref 26.0–34.0)
MCH: 28.3 pg (ref 26.0–34.0)
MCHC: 33.2 g/dL (ref 30.0–36.0)
MCHC: 33.2 g/dL (ref 30.0–36.0)
MCV: 85.1 fL (ref 80.0–100.0)
MCV: 85.2 fL (ref 80.0–100.0)
Platelets: 132 10*3/uL — ABNORMAL LOW (ref 150–400)
Platelets: 140 10*3/uL — ABNORMAL LOW (ref 150–400)
RBC: 4.42 MIL/uL (ref 3.87–5.11)
RBC: 4.27 MIL/uL (ref 3.87–5.11)
RDW: 14.6 % (ref 11.5–15.5)
RDW: 14.6 % (ref 11.5–15.5)
WBC: 16.1 10*3/uL — ABNORMAL HIGH (ref 4.0–10.5)
WBC: 17.5 10*3/uL — ABNORMAL HIGH (ref 4.0–10.5)
nRBC: 0 % (ref 0.0–0.2)
nRBC: 0 % (ref 0.0–0.2)

## 2021-06-05 LAB — GLUCOSE, CAPILLARY
Glucose-Capillary: 97 mg/dL (ref 70–99)
Glucose-Capillary: 133 mg/dL — ABNORMAL HIGH (ref 70–99)
Glucose-Capillary: 133 mg/dL — ABNORMAL HIGH (ref 70–99)
Glucose-Capillary: 128 mg/dL — ABNORMAL HIGH (ref 70–99)
Glucose-Capillary: 116 mg/dL — ABNORMAL HIGH (ref 70–99)
Glucose-Capillary: 134 mg/dL — ABNORMAL HIGH (ref 70–99)
Glucose-Capillary: 151 mg/dL — ABNORMAL HIGH (ref 70–99)
Glucose-Capillary: 147 mg/dL — ABNORMAL HIGH (ref 70–99)
Glucose-Capillary: 173 mg/dL — ABNORMAL HIGH (ref 70–99)
Glucose-Capillary: 144 mg/dL — ABNORMAL HIGH (ref 70–99)
Glucose-Capillary: 146 mg/dL — ABNORMAL HIGH (ref 70–99)

## 2021-06-05 LAB — PLATELET COUNT: Platelets: 146 10*3/uL — ABNORMAL LOW (ref 150–400)

## 2021-06-05 LAB — ABO/RH: ABO/RH(D): O POS

## 2021-06-05 LAB — HEMOGLOBIN AND HEMATOCRIT, BLOOD
HCT: 30 % — ABNORMAL LOW (ref 36.0–46.0)
Hemoglobin: 10.2 g/dL — ABNORMAL LOW (ref 12.0–15.0)

## 2021-06-05 LAB — APTT
aPTT: 27 seconds (ref 24–36)
aPTT: 35 seconds (ref 24–36)

## 2021-06-05 LAB — MAGNESIUM: Magnesium: 3.2 mg/dL — ABNORMAL HIGH (ref 1.7–2.4)

## 2021-06-05 SURGERY — REPLACEMENT, AORTIC VALVE, OPEN
Anesthesia: General | Site: Chest

## 2021-06-05 MED ORDER — SODIUM CHLORIDE 0.9% FLUSH: 3.0000 mL | INTRAVENOUS | Status: DC | PRN

## 2021-06-05 MED ORDER — METOPROLOL TARTRATE 12.5 MG HALF TABLET
12.5000 mg | ORAL_TABLET | Freq: Two times a day (BID) | ORAL | Status: DC
  Administered 2021-06-06 – 2021-06-10 (×9): 12.5 mg via ORAL
  Filled 2021-06-05 (×10): qty 1

## 2021-06-05 MED ORDER — ACETAMINOPHEN 160 MG/5ML PO SOLN: 1000.0000 mg | Freq: Four times a day (QID) | ORAL | Status: AC

## 2021-06-05 MED ORDER — DOCUSATE SODIUM 100 MG PO CAPS
200.0000 mg | ORAL_CAPSULE | Freq: Every day | ORAL | Status: DC
  Administered 2021-06-06 – 2021-06-13 (×7): 200 mg via ORAL
  Filled 2021-06-05 (×8): qty 2

## 2021-06-05 MED ORDER — MAGNESIUM SULFATE 4 GM/100ML IV SOLN
4.0000 g | Freq: Once | INTRAVENOUS | Status: AC
  Administered 2021-06-05: 4 g via INTRAVENOUS
  Filled 2021-06-05: qty 100

## 2021-06-05 MED ORDER — METOPROLOL TARTRATE 5 MG/5ML IV SOLN
2.5000 mg | INTRAVENOUS | Status: DC | PRN
  Administered 2021-06-07: 2.5 mg via INTRAVENOUS
  Administered 2021-06-11 (×2): 5 mg via INTRAVENOUS
  Filled 2021-06-05 (×4): qty 5

## 2021-06-05 MED ORDER — ~~LOC~~ CARDIAC SURGERY, PATIENT & FAMILY EDUCATION
Freq: Once | Status: DC
  Filled 2021-06-05: qty 1

## 2021-06-05 MED ORDER — EPHEDRINE 5 MG/ML INJ
INTRAVENOUS | Status: AC
  Filled 2021-06-05: qty 5

## 2021-06-05 MED ORDER — INSULIN REGULAR(HUMAN) IN NACL 100-0.9 UT/100ML-% IV SOLN
INTRAVENOUS | Status: DC
  Administered 2021-06-06: 1.7 [IU]/h via INTRAVENOUS
  Filled 2021-06-05: qty 100

## 2021-06-05 MED ORDER — ALBUTEROL SULFATE (2.5 MG/3ML) 0.083% IN NEBU: 2.5000 mg | INHALATION_SOLUTION | Freq: Four times a day (QID) | RESPIRATORY_TRACT | Status: DC | PRN

## 2021-06-05 MED ORDER — MIDAZOLAM HCL 2 MG/2ML IJ SOLN
2.0000 mg | INTRAMUSCULAR | Status: DC | PRN
Start: 2021-06-05 — End: 2021-06-10

## 2021-06-05 MED ORDER — SODIUM CHLORIDE 0.45 % IV SOLN: INTRAVENOUS | Status: DC | PRN

## 2021-06-05 MED ORDER — LEVOTHYROXINE SODIUM 50 MCG PO TABS
125.0000 ug | ORAL_TABLET | Freq: Every day | ORAL | Status: DC
  Administered 2021-06-06 – 2021-06-13 (×8): 125 ug via ORAL
  Filled 2021-06-05 (×8): qty 3

## 2021-06-05 MED ORDER — CARBOXYMETHYLCELLULOSE SODIUM 0.5 % OP SOLN: 1.0000 [drp] | Freq: Three times a day (TID) | OPHTHALMIC | Status: DC | PRN

## 2021-06-05 MED ORDER — HEMOSTATIC AGENTS (NO CHARGE) OPTIME
TOPICAL | Status: DC | PRN
  Administered 2021-06-05: 1 via TOPICAL

## 2021-06-05 MED ORDER — POVIDONE-IODINE 10 % EX SOLN
CUTANEOUS | Status: DC | PRN
  Administered 2021-06-05: 1 via TOPICAL

## 2021-06-05 MED ORDER — CHLORHEXIDINE GLUCONATE 4 % EX LIQD: 30.0000 mL | CUTANEOUS | Status: DC

## 2021-06-05 MED ORDER — LORATADINE 10 MG PO TABS
10.0000 mg | ORAL_TABLET | Freq: Every day | ORAL | Status: DC
  Administered 2021-06-06 – 2021-06-13 (×8): 10 mg via ORAL
  Filled 2021-06-05 (×8): qty 1

## 2021-06-05 MED ORDER — POTASSIUM CHLORIDE 10 MEQ/50ML IV SOLN
10.0000 meq | INTRAVENOUS | Status: AC
  Administered 2021-06-05 (×3): 10 meq via INTRAVENOUS

## 2021-06-05 MED ORDER — 0.9 % SODIUM CHLORIDE (POUR BTL) OPTIME
TOPICAL | Status: DC | PRN
  Administered 2021-06-05: 5000 mL

## 2021-06-05 MED ORDER — ROCURONIUM BROMIDE 10 MG/ML (PF) SYRINGE
PREFILLED_SYRINGE | INTRAVENOUS | Status: AC
  Filled 2021-06-05: qty 10

## 2021-06-05 MED ORDER — LACTATED RINGERS IV SOLN: 500.0000 mL | Freq: Once | INTRAVENOUS | Status: DC | PRN

## 2021-06-05 MED ORDER — LEVOCETIRIZINE DIHYDROCHLORIDE 5 MG PO TABS: 5.0000 mg | ORAL_TABLET | Freq: Every evening | ORAL | Status: DC

## 2021-06-05 MED ORDER — ASPIRIN EC 325 MG PO TBEC
325.0000 mg | DELAYED_RELEASE_TABLET | Freq: Every day | ORAL | Status: DC
  Administered 2021-06-06 – 2021-06-11 (×6): 325 mg via ORAL
  Filled 2021-06-05 (×6): qty 1

## 2021-06-05 MED ORDER — METOPROLOL TARTRATE 25 MG/10 ML ORAL SUSPENSION
12.5000 mg | Freq: Two times a day (BID) | ORAL | Status: DC
  Administered 2021-06-06: 12.5 mg

## 2021-06-05 MED ORDER — PANTOPRAZOLE SODIUM 40 MG PO TBEC
40.0000 mg | DELAYED_RELEASE_TABLET | Freq: Every day | ORAL | Status: DC
  Administered 2021-06-07 – 2021-06-13 (×7): 40 mg via ORAL
  Filled 2021-06-05 (×7): qty 1

## 2021-06-05 MED ORDER — STERILE WATER FOR IRRIGATION IR SOLN
Status: DC | PRN
  Administered 2021-06-05: 2000 mL

## 2021-06-05 MED ORDER — SODIUM BICARBONATE 8.4 % IV SOLN
50.0000 meq | Freq: Once | INTRAVENOUS | Status: AC
  Administered 2021-06-05: 50 meq via INTRAVENOUS

## 2021-06-05 MED ORDER — ROPINIROLE HCL 0.5 MG PO TABS
0.5000 mg | ORAL_TABLET | Freq: Every day | ORAL | Status: DC
  Administered 2021-06-06 – 2021-06-12 (×7): 0.5 mg via ORAL
  Filled 2021-06-05 (×10): qty 1

## 2021-06-05 MED ORDER — ASPIRIN 81 MG PO CHEW
324.0000 mg | CHEWABLE_TABLET | Freq: Every day | ORAL | Status: DC
  Filled 2021-06-05 (×2): qty 4

## 2021-06-05 MED ORDER — ACETAMINOPHEN 160 MG/5ML PO SOLN: 650.0000 mg | Freq: Once | ORAL | Status: AC

## 2021-06-05 MED ORDER — NITROGLYCERIN IN D5W 200-5 MCG/ML-% IV SOLN
0.0000 ug/min | INTRAVENOUS | Status: DC
Start: 2021-06-05 — End: 2021-06-07

## 2021-06-05 MED ORDER — METOPROLOL TARTRATE 12.5 MG HALF TABLET: 12.5000 mg | ORAL_TABLET | Freq: Once | ORAL | Status: DC

## 2021-06-05 MED ORDER — BISACODYL 10 MG RE SUPP: 10.0000 mg | Freq: Every day | RECTAL | Status: DC

## 2021-06-05 MED ORDER — DEXMEDETOMIDINE HCL IN NACL 400 MCG/100ML IV SOLN
0.0000 ug/kg/h | INTRAVENOUS | Status: DC
  Administered 2021-06-05: 0.7 ug/kg/h via INTRAVENOUS
  Filled 2021-06-05: qty 100

## 2021-06-05 MED ORDER — SODIUM CHLORIDE 0.9 % IV SOLN: INTRAVENOUS | Status: DC | PRN

## 2021-06-05 MED ORDER — MIDAZOLAM HCL 5 MG/5ML IJ SOLN
INTRAMUSCULAR | Status: DC | PRN
  Administered 2021-06-05: 2 mg via INTRAVENOUS
  Administered 2021-06-05: 5 mg via INTRAVENOUS
  Administered 2021-06-05: 3 mg via INTRAVENOUS

## 2021-06-05 MED ORDER — PHENYLEPHRINE 40 MCG/ML (10ML) SYRINGE FOR IV PUSH (FOR BLOOD PRESSURE SUPPORT)
PREFILLED_SYRINGE | INTRAVENOUS | Status: AC
  Filled 2021-06-05: qty 10

## 2021-06-05 MED ORDER — ALPRAZOLAM 0.5 MG PO TABS
0.5000 mg | ORAL_TABLET | Freq: Every day | ORAL | Status: DC | PRN
  Administered 2021-06-06 – 2021-06-07 (×2): 1 mg via ORAL
  Filled 2021-06-05 (×3): qty 2

## 2021-06-05 MED ORDER — ARTIFICIAL TEARS OPHTHALMIC OINT
TOPICAL_OINTMENT | OPHTHALMIC | Status: AC
  Filled 2021-06-05: qty 3.5

## 2021-06-05 MED ORDER — ACETAMINOPHEN 650 MG RE SUPP
650.0000 mg | Freq: Once | RECTAL | Status: AC
  Administered 2021-06-05: 650 mg via RECTAL

## 2021-06-05 MED ORDER — MORPHINE SULFATE (PF) 2 MG/ML IV SOLN
1.0000 mg | INTRAVENOUS | Status: DC | PRN
  Administered 2021-06-06: 1 mg via INTRAVENOUS
  Filled 2021-06-05 (×2): qty 1

## 2021-06-05 MED ORDER — ARTIFICIAL TEARS OPHTHALMIC OINT
TOPICAL_OINTMENT | OPHTHALMIC | Status: DC | PRN
  Administered 2021-06-05: 1 via OPHTHALMIC

## 2021-06-05 MED ORDER — CHLORHEXIDINE GLUCONATE CLOTH 2 % EX PADS
6.0000 | MEDICATED_PAD | Freq: Every day | CUTANEOUS | Status: DC
  Administered 2021-06-05 – 2021-06-13 (×8): 6 via TOPICAL

## 2021-06-05 MED ORDER — LACTATED RINGERS IV SOLN: INTRAVENOUS | Status: DC

## 2021-06-05 MED ORDER — PROPOFOL 10 MG/ML IV BOLUS
INTRAVENOUS | Status: AC
  Filled 2021-06-05: qty 20

## 2021-06-05 MED ORDER — BISACODYL 5 MG PO TBEC
10.0000 mg | DELAYED_RELEASE_TABLET | Freq: Every day | ORAL | Status: DC
  Administered 2021-06-06 – 2021-06-10 (×5): 10 mg via ORAL
  Filled 2021-06-05 (×5): qty 2

## 2021-06-05 MED ORDER — SODIUM CHLORIDE (PF) 0.9 % IJ SOLN
INTRAMUSCULAR | Status: AC
  Filled 2021-06-05: qty 10

## 2021-06-05 MED ORDER — ONDANSETRON HCL 4 MG/2ML IJ SOLN: 4.0000 mg | Freq: Four times a day (QID) | INTRAMUSCULAR | Status: DC | PRN

## 2021-06-05 MED ORDER — THROMBIN (RECOMBINANT) 20000 UNITS EX SOLR
CUTANEOUS | Status: AC
  Filled 2021-06-05: qty 20000

## 2021-06-05 MED ORDER — OXYCODONE HCL 5 MG PO TABS
5.0000 mg | ORAL_TABLET | ORAL | Status: DC | PRN
  Administered 2021-06-06 – 2021-06-07 (×5): 5 mg via ORAL
  Administered 2021-06-08 – 2021-06-11 (×8): 10 mg via ORAL
  Filled 2021-06-05: qty 2
  Filled 2021-06-05: qty 1
  Filled 2021-06-05 (×3): qty 2
  Filled 2021-06-05: qty 1
  Filled 2021-06-05 (×5): qty 2
  Filled 2021-06-05 (×2): qty 1

## 2021-06-05 MED ORDER — PLASMA-LYTE A IV SOLN
INTRAVENOUS | Status: DC | PRN
  Administered 2021-06-05: 1000 mL via INTRAVASCULAR

## 2021-06-05 MED ORDER — PHENYLEPHRINE HCL-NACL 20-0.9 MG/250ML-% IV SOLN
0.0000 ug/min | INTRAVENOUS | Status: DC
  Administered 2021-06-05: 30 ug/min via INTRAVENOUS
  Filled 2021-06-05: qty 250

## 2021-06-05 MED ORDER — CHLORHEXIDINE GLUCONATE 0.12 % MT SOLN
15.0000 mL | Freq: Two times a day (BID) | OROMUCOSAL | Status: DC
  Administered 2021-06-06 – 2021-06-13 (×12): 15 mL via OROMUCOSAL
  Filled 2021-06-05 (×12): qty 15

## 2021-06-05 MED ORDER — CHLORHEXIDINE GLUCONATE 0.12 % MT SOLN
OROMUCOSAL | Status: AC
  Administered 2021-06-05: 15 mL via OROMUCOSAL
  Filled 2021-06-05: qty 15

## 2021-06-05 MED ORDER — PROTAMINE SULFATE 10 MG/ML IV SOLN
INTRAVENOUS | Status: DC | PRN
  Administered 2021-06-05: 250 mg via INTRAVENOUS

## 2021-06-05 MED ORDER — HEPARIN SODIUM (PORCINE) 1000 UNIT/ML IJ SOLN
INTRAMUSCULAR | Status: DC | PRN
Start: 2021-06-05 — End: 2021-06-05
  Administered 2021-06-05: 25000 [IU] via INTRAVENOUS

## 2021-06-05 MED ORDER — FENTANYL CITRATE (PF) 250 MCG/5ML IJ SOLN
INTRAMUSCULAR | Status: DC | PRN
  Administered 2021-06-05: 50 ug via INTRAVENOUS
  Administered 2021-06-05 (×3): 100 ug via INTRAVENOUS
  Administered 2021-06-05: 50 ug via INTRAVENOUS
  Administered 2021-06-05 (×3): 100 ug via INTRAVENOUS
  Administered 2021-06-05 (×2): 150 ug via INTRAVENOUS
  Administered 2021-06-05: 100 ug via INTRAVENOUS

## 2021-06-05 MED ORDER — CHLORHEXIDINE GLUCONATE 0.12 % MT SOLN
15.0000 mL | OROMUCOSAL | Status: AC
  Administered 2021-06-05: 15 mL via OROMUCOSAL

## 2021-06-05 MED ORDER — POVIDONE-IODINE 7.5 % EX SOLN
CUTANEOUS | Status: DC | PRN
  Administered 2021-06-05: 1 via TOPICAL

## 2021-06-05 MED ORDER — CHLORHEXIDINE GLUCONATE 0.12% ORAL RINSE (MEDLINE KIT): 15.0000 mL | Freq: Two times a day (BID) | OROMUCOSAL | Status: DC

## 2021-06-05 MED ORDER — ALBUMIN HUMAN 5 % IV SOLN
250.0000 mL | INTRAVENOUS | Status: AC | PRN
  Administered 2021-06-05 – 2021-06-06 (×3): 12.5 g via INTRAVENOUS
  Filled 2021-06-05: qty 250

## 2021-06-05 MED ORDER — FENTANYL CITRATE (PF) 250 MCG/5ML IJ SOLN
INTRAMUSCULAR | Status: AC
  Filled 2021-06-05: qty 30

## 2021-06-05 MED ORDER — EPHEDRINE SULFATE-NACL 50-0.9 MG/10ML-% IV SOSY
PREFILLED_SYRINGE | INTRAVENOUS | Status: DC | PRN
  Administered 2021-06-05 (×2): 10 mg via INTRAVENOUS

## 2021-06-05 MED ORDER — TRAMADOL HCL 50 MG PO TABS
50.0000 mg | ORAL_TABLET | ORAL | Status: DC | PRN
  Administered 2021-06-06: 50 mg via ORAL
  Administered 2021-06-07 – 2021-06-08 (×3): 100 mg via ORAL
  Filled 2021-06-05: qty 1
  Filled 2021-06-05 (×3): qty 2

## 2021-06-05 MED ORDER — LACTATED RINGERS IV SOLN
INTRAVENOUS | Status: DC
Start: 2021-06-05 — End: 2021-06-12

## 2021-06-05 MED ORDER — ONDANSETRON HCL 4 MG/2ML IJ SOLN
INTRAMUSCULAR | Status: AC
  Filled 2021-06-05: qty 2

## 2021-06-05 MED ORDER — SODIUM CHLORIDE 0.9% FLUSH
10.0000 mL | Freq: Two times a day (BID) | INTRAVENOUS | Status: DC
  Administered 2021-06-06 – 2021-06-07 (×4): 10 mL

## 2021-06-05 MED ORDER — SODIUM CHLORIDE 0.9 % IV SOLN: INTRAVENOUS | Status: DC

## 2021-06-05 MED ORDER — MIDAZOLAM HCL (PF) 10 MG/2ML IJ SOLN
INTRAMUSCULAR | Status: AC
  Filled 2021-06-05: qty 2

## 2021-06-05 MED ORDER — PROPOFOL 10 MG/ML IV BOLUS
INTRAVENOUS | Status: DC | PRN
  Administered 2021-06-05: 20 mg via INTRAVENOUS
  Administered 2021-06-05: 80 mg via INTRAVENOUS
  Administered 2021-06-05: 20 mg via INTRAVENOUS

## 2021-06-05 MED ORDER — ORAL CARE MOUTH RINSE
15.0000 mL | OROMUCOSAL | Status: DC
  Administered 2021-06-05 (×2): 15 mL via OROMUCOSAL

## 2021-06-05 MED ORDER — DEXTROSE 50 % IV SOLN: 0.0000 mL | INTRAVENOUS | Status: DC | PRN

## 2021-06-05 MED ORDER — ORAL CARE MOUTH RINSE
15.0000 mL | Freq: Two times a day (BID) | OROMUCOSAL | Status: DC
  Administered 2021-06-06 – 2021-06-11 (×6): 15 mL via OROMUCOSAL

## 2021-06-05 MED ORDER — ALBUMIN HUMAN 5 % IV SOLN: INTRAVENOUS | Status: DC | PRN

## 2021-06-05 MED ORDER — ATORVASTATIN CALCIUM 40 MG PO TABS
40.0000 mg | ORAL_TABLET | Freq: Every day | ORAL | Status: DC
  Administered 2021-06-06 – 2021-06-13 (×8): 40 mg via ORAL
  Filled 2021-06-05 (×8): qty 1

## 2021-06-05 MED ORDER — SODIUM CHLORIDE 0.9% FLUSH: 10.0000 mL | INTRAVENOUS | Status: DC | PRN

## 2021-06-05 MED ORDER — CHLORHEXIDINE GLUCONATE 0.12 % MT SOLN: 15.0000 mL | Freq: Once | OROMUCOSAL | Status: AC

## 2021-06-05 MED ORDER — SODIUM CHLORIDE 0.9 % IV SOLN: 250.0000 mL | INTRAVENOUS | Status: DC

## 2021-06-05 MED ORDER — MANNITOL 20 % IV SOLN
INTRAVENOUS | Status: DC
  Filled 2021-06-05: qty 13

## 2021-06-05 MED ORDER — THROMBIN 20000 UNITS EX SOLR
OROMUCOSAL | Status: DC | PRN
  Administered 2021-06-05 (×3): 4 mL via TOPICAL

## 2021-06-05 MED ORDER — MELATONIN 5 MG PO TABS
10.0000 mg | ORAL_TABLET | Freq: Every day | ORAL | Status: DC
  Administered 2021-06-06 – 2021-06-12 (×7): 10 mg via ORAL
  Filled 2021-06-05 (×7): qty 2

## 2021-06-05 MED ORDER — FAMOTIDINE 20 MG IN NS 100 ML IVPB
20.0000 mg | Freq: Two times a day (BID) | INTRAVENOUS | Status: AC
  Administered 2021-06-05: 20 mg via INTRAVENOUS
  Filled 2021-06-05: qty 100

## 2021-06-05 MED ORDER — LACTATED RINGERS IV SOLN: INTRAVENOUS | Status: DC | PRN

## 2021-06-05 MED ORDER — CALCIUM CHLORIDE 10 % IV SOLN
INTRAVENOUS | Status: DC | PRN
  Administered 2021-06-05: 200 mg via INTRAVENOUS

## 2021-06-05 MED ORDER — SODIUM CHLORIDE 0.9% FLUSH
3.0000 mL | Freq: Two times a day (BID) | INTRAVENOUS | Status: DC
  Administered 2021-06-06 – 2021-06-13 (×9): 3 mL via INTRAVENOUS

## 2021-06-05 MED ORDER — METOCLOPRAMIDE HCL 5 MG/ML IJ SOLN
10.0000 mg | Freq: Four times a day (QID) | INTRAMUSCULAR | Status: AC
  Administered 2021-06-05 – 2021-06-06 (×3): 10 mg via INTRAVENOUS
  Filled 2021-06-05 (×3): qty 2

## 2021-06-05 MED ORDER — PANTOPRAZOLE SODIUM 40 MG PO TBEC: 80.0000 mg | DELAYED_RELEASE_TABLET | Freq: Every day | ORAL | Status: DC

## 2021-06-05 MED ORDER — ACETAMINOPHEN 500 MG PO TABS
1000.0000 mg | ORAL_TABLET | Freq: Four times a day (QID) | ORAL | Status: AC
  Administered 2021-06-06 – 2021-06-10 (×18): 1000 mg via ORAL
  Filled 2021-06-05 (×17): qty 2

## 2021-06-05 MED ORDER — ROCURONIUM BROMIDE 10 MG/ML (PF) SYRINGE
PREFILLED_SYRINGE | INTRAVENOUS | Status: DC | PRN
  Administered 2021-06-05: 70 mg via INTRAVENOUS
  Administered 2021-06-05: 30 mg via INTRAVENOUS

## 2021-06-05 MED ORDER — VANCOMYCIN HCL IN DEXTROSE 1-5 GM/200ML-% IV SOLN
1000.0000 mg | Freq: Once | INTRAVENOUS | Status: AC
  Administered 2021-06-05: 1000 mg via INTRAVENOUS
  Filled 2021-06-05: qty 200

## 2021-06-05 MED ORDER — SERTRALINE HCL 100 MG PO TABS
100.0000 mg | ORAL_TABLET | Freq: Every day | ORAL | Status: DC
  Administered 2021-06-06 – 2021-06-13 (×8): 100 mg via ORAL
  Filled 2021-06-05 (×8): qty 1

## 2021-06-05 MED ORDER — ONDANSETRON HCL 4 MG/2ML IJ SOLN
INTRAMUSCULAR | Status: DC | PRN
  Administered 2021-06-05: 4 mg via INTRAVENOUS

## 2021-06-05 MED FILL — Magnesium Sulfate Inj 50%: INTRAMUSCULAR | Qty: 10 | Status: AC

## 2021-06-05 MED FILL — Potassium Chloride Inj 2 mEq/ML: INTRAVENOUS | Qty: 40 | Status: AC

## 2021-06-05 MED FILL — Heparin Sodium (Porcine) Inj 1000 Unit/ML: Qty: 1000 | Status: AC

## 2021-06-05 SURGICAL SUPPLY — 79 items
ADAPTER CARDIO PERF ANTE/RETRO (ADAPTER) ×3 IMPLANT
ADPR PRFSN 84XANTGRD RTRGD (ADAPTER) ×2
BAG DECANTER FOR FLEXI CONT (MISCELLANEOUS) ×3 IMPLANT
BLADE CLIPPER SURG (BLADE) ×3 IMPLANT
BLADE STERNUM SYSTEM 6 (BLADE) ×3 IMPLANT
BLADE SURG 15 STRL LF DISP TIS (BLADE) ×2 IMPLANT
BLADE SURG 15 STRL SS (BLADE) ×3
CANISTER SUCT 3000ML PPV (MISCELLANEOUS) ×3 IMPLANT
CANNULA GUNDRY RCSP 15FR (MISCELLANEOUS) ×3 IMPLANT
CATH HEART VENT LEFT (CATHETERS) ×2 IMPLANT
CATH ROBINSON RED A/P 18FR (CATHETERS) ×9 IMPLANT
CATH THORACIC 36FR (CATHETERS) ×3 IMPLANT
CATH THORACIC 36FR RT ANG (CATHETERS) ×3 IMPLANT
CNTNR URN SCR LID CUP LEK RST (MISCELLANEOUS) ×2 IMPLANT
CONT SPEC 4OZ STRL OR WHT (MISCELLANEOUS) ×3
CONTAINER PROTECT SURGISLUSH (MISCELLANEOUS) ×3 IMPLANT
COVER SURGICAL LIGHT HANDLE (MISCELLANEOUS) ×3 IMPLANT
DEVICE SUT CK QUICK LOAD MINI (Prosthesis & Implant Heart) ×1 IMPLANT
DRAPE CARDIOVASCULAR INCISE (DRAPES) ×3
DRAPE SRG 135X102X78XABS (DRAPES) ×2 IMPLANT
DRAPE WARM FLUID 44X44 (DRAPES) ×3 IMPLANT
DRSG COVADERM 4X14 (GAUZE/BANDAGES/DRESSINGS) ×3 IMPLANT
ELECT CAUTERY BLADE 6.4 (BLADE) ×3 IMPLANT
ELECT REM PT RETURN 9FT ADLT (ELECTROSURGICAL) ×6
ELECTRODE REM PT RTRN 9FT ADLT (ELECTROSURGICAL) ×4 IMPLANT
FELT TEFLON 1X6 (MISCELLANEOUS) ×6 IMPLANT
GAUZE 4X4 16PLY ~~LOC~~+RFID DBL (SPONGE) ×1 IMPLANT
GAUZE SPONGE 4X4 12PLY STRL (GAUZE/BANDAGES/DRESSINGS) ×3 IMPLANT
GLOVE SURG ENC MOIS LTX SZ6 (GLOVE) IMPLANT
GLOVE SURG ENC MOIS LTX SZ6.5 (GLOVE) IMPLANT
GLOVE SURG ENC MOIS LTX SZ7 (GLOVE) IMPLANT
GLOVE SURG ENC MOIS LTX SZ7.5 (GLOVE) IMPLANT
GLOVE SURG MICRO LTX SZ6 (GLOVE) ×4 IMPLANT
GLOVE SURG MICRO LTX SZ6.5 (GLOVE) ×2 IMPLANT
GLOVE SURG MICRO LTX SZ7 (GLOVE) ×7 IMPLANT
GLOVE SURG UNDER POLY LF SZ6.5 (GLOVE) ×1 IMPLANT
GOWN STRL REUS W/ TWL LRG LVL3 (GOWN DISPOSABLE) ×8 IMPLANT
GOWN STRL REUS W/ TWL XL LVL3 (GOWN DISPOSABLE) ×2 IMPLANT
GOWN STRL REUS W/TWL LRG LVL3 (GOWN DISPOSABLE) ×12
GOWN STRL REUS W/TWL XL LVL3 (GOWN DISPOSABLE) ×3
HEMOSTAT POWDER SURGIFOAM 1G (HEMOSTASIS) ×9 IMPLANT
HEMOSTAT SURGICEL 2X14 (HEMOSTASIS) ×3 IMPLANT
KIT BASIN OR (CUSTOM PROCEDURE TRAY) ×3 IMPLANT
KIT CATH CPB BARTLE (MISCELLANEOUS) ×3 IMPLANT
KIT SUCTION CATH 14FR (SUCTIONS) ×3 IMPLANT
KIT SUT CK MINI COMBO 4X17 (Prosthesis & Implant Heart) ×1 IMPLANT
KIT TURNOVER KIT B (KITS) ×3 IMPLANT
LINE VENT (MISCELLANEOUS) ×1 IMPLANT
NS IRRIG 1000ML POUR BTL (IV SOLUTION) ×18 IMPLANT
PACK E OPEN HEART (SUTURE) ×3 IMPLANT
PACK OPEN HEART (CUSTOM PROCEDURE TRAY) ×3 IMPLANT
PAD ARMBOARD 7.5X6 YLW CONV (MISCELLANEOUS) ×6 IMPLANT
POSITIONER HEAD DONUT 9IN (MISCELLANEOUS) ×3 IMPLANT
SET MPS 3-ND DEL (MISCELLANEOUS) ×1 IMPLANT
SPONGE T-LAP 18X18 ~~LOC~~+RFID (SPONGE) ×4 IMPLANT
SPONGE T-LAP 4X18 ~~LOC~~+RFID (SPONGE) ×1 IMPLANT
SUT BONE WAX W31G (SUTURE) ×3 IMPLANT
SUT EB EXC GRN/WHT 2-0 V-5 (SUTURE) ×6 IMPLANT
SUT ETHIBON EXCEL 2-0 V-5 (SUTURE) ×1 IMPLANT
SUT ETHIBOND V-5 VALVE (SUTURE) ×3 IMPLANT
SUT PROLENE 3 0 SH DA (SUTURE) IMPLANT
SUT PROLENE 3 0 SH1 36 (SUTURE) ×5 IMPLANT
SUT PROLENE 4 0 RB 1 (SUTURE) ×9
SUT PROLENE 4-0 RB1 .5 CRCL 36 (SUTURE) ×6 IMPLANT
SUT PROLENE 5 0 C 1 36 (SUTURE) ×1 IMPLANT
SUT STEEL 6MS V (SUTURE) ×2 IMPLANT
SUT VIC AB 1 CTX 36 (SUTURE) ×9
SUT VIC AB 1 CTX36XBRD ANBCTR (SUTURE) ×4 IMPLANT
SYSTEM SAHARA CHEST DRAIN ATS (WOUND CARE) ×3 IMPLANT
TAPE CLOTH SURG 4X10 WHT LF (GAUZE/BANDAGES/DRESSINGS) ×1 IMPLANT
TAPE PAPER 2X10 WHT MICROPORE (GAUZE/BANDAGES/DRESSINGS) ×1 IMPLANT
TOWEL GREEN STERILE (TOWEL DISPOSABLE) ×3 IMPLANT
TOWEL GREEN STERILE FF (TOWEL DISPOSABLE) ×3 IMPLANT
TRAY FOLEY SLVR 16FR TEMP STAT (SET/KITS/TRAYS/PACK) ×3 IMPLANT
TUBE SUCT INTRACARD DLP 20F (MISCELLANEOUS) ×1 IMPLANT
UNDERPAD 30X36 HEAVY ABSORB (UNDERPADS AND DIAPERS) ×3 IMPLANT
VALVE AORTIC SZ27 INSP/RESIL (Valve) ×1 IMPLANT
VENT LEFT HEART 12002 (CATHETERS) ×3
WATER STERILE IRR 1000ML POUR (IV SOLUTION) ×6 IMPLANT

## 2021-06-05 NOTE — Hospital Course (Addendum)
History of Present Illness:  The patient is a 70 year old woman with a history of diabetes, fibromyalgia, hypertension, anxiety and depression, and severe aortic stenosis who was referred by Dr. Jacinto Halim for consideration of aortic valve replacement.  Her most recent echo on 03/25/2021 showed a bicuspid aortic valve with a raphae between the left and right coronary cusp.  The valve was moderately calcified with restricted leaflet mobility.  The V-max was 4.0 m/s with a mean gradient of 41 mmHg consistent with severe aortic stenosis.  Aortic valve area was 0.6 cm.  There is mild aortic insufficiency and mild mitral regurgitation.  Left ventricular ejection fraction was 55 to 60% with moderate concentric LVH and grade 2 diastolic dysfunction.  She was evaluated by Dr. Laneta Simmers at [facility].  At her visit she reported progressive exertional shortness of breath over the past 6 months.  She has a history of asthma with occasional episodes but thought that her increased symptoms were due to her asthma.  They were not getting better with use of her rescue inhaler.  She has also had substernal chest pressure with physical activity.  She reports orthopnea and frequently has to sit up in bed.  She has had no dizziness or syncope.  She has had exertional fatigue.  It was felt the patient should undergo replacement of her Aortic Valve.  The risks and benefits of the procedure were explained to the patient and she was agreeable to proceed.  Hospital Course:  Mrs. Diclemente presented to [facility] on 06/05/21.  She was taken to the operating room and underwent Aortic Valve Replacement with a 27 mm Edwards Resilia Inspiris Bioprosthetic heart valve.  She tolerated the procedure without difficulty and was taken to the SICU in stable condition.   She was extubated the evening of surgery without difficulty. She was weaned off Neo Synephrine drip. Theone Murdoch, a line, chest tubes, and foley were all  removed early in her post operative course. She had expected post op blood loss anemia. Her last H and H was 10.3 and 32.2. She had thrombocytopenia. Her last platelet count was up to 148,000. She was transitioned off the Insulin drip. Her HGA1C pre op was 7.1. She has an Insulin pump but because she was groggy in the evenings, on Insulin. Will have patient resume pump at discharge. She was felt stable for transfer to the floor on 09/25;however, a bed was not available until 09/27.   On 06/08/2021 she remains quite stable.  Epicardial pacing wires were removed on 09/26.She still continues to have a moderate oxygen requirement and was placed on CPAP overnight.  She is maintaining sinus rhythm but is having PACs and PVCs.  She is diuresing well.  Chest tubes and pacing wires are removed.  PICC line was placed as she had poor venous access.  On the fourth postoperative day, she developed atrial fibrillation with RVR.  She had ventricular rates as high as 180 but only briefly.  She was started on amiodarone infusion.  Since she only had a small peripheral IV, questing placement of a PICC line. She did continue to have some episodes of SVT and beta-blocker was uptitrated.  She was transitioned to oral Amiodarone on 09/28. She has been tolerating a diet but has not had a bowel movement. She was given a laxative with success. Her sternal wound is clean and dry. She is still volume overloaded and is requiring diuresis. Unfortunately, her a fib rate was still not  well controlled. Cardizem 60 mg QID was added on 09/29. She was started on Apixaban 5 mg bid and Digoxin 0.25 mg daily on 09/30.  Her heart rhythm and rate were continued to be monitored closely and this has stabilized with no further episodes of supraventricular tachycardias.  Her final medication regimen is listed below.  At the time of discharge the patient was feeling well.  Her incisions continue to heal without evidence of infection.  Oxygen was completely  weaned and was maintaining good saturations on room air.  She was tolerating routine cardiac rehab modalities without difficulty.  She was felt to be quite stable for discharge on 06/13/2021.

## 2021-06-05 NOTE — OR Nursing (Signed)
Patient's insulin pump and CGM taken to surgical waiting area and handed off to patient's spouse.

## 2021-06-05 NOTE — Progress Notes (Signed)
Per Rosiland Oz, CRNA unsuccessful with ABG in pre-op but it will be collected back in the OR once pt is asleep.   Viviano Simas, RN

## 2021-06-05 NOTE — OR Nursing (Signed)
Twenty minute call to [facility] Charge Nurse at 1136. Spoke to [place]. Cath Lab also notified of timing. Spoke to [employer].

## 2021-06-05 NOTE — Discharge Summary (Signed)
[address]       [place] [address]             [phone]    Physician Discharge Summary  Patient ID: [name] MRN: [id number] DOB/AGE: 1951-06-18 70 y.o.  Admit date: 06/05/2021 Discharge date: 06/14/2021  Admission Diagnoses:  Patient Active Problem List   Diagnosis Date Noted   Dyspnea on exertion    Aortic stenosis 04/27/2021   SBO (small bowel obstruction) (HCC) 02/09/2018   Allergic rhinitis 01/09/2018   Diabetic neuropathy (HCC) 06/30/2017   Vitamin D deficiency 06/30/2017   Aortic regurgitation 06/30/2017   Mitral valve prolapse 06/30/2017   Anxiety and depression 06/30/2017   History of nocturia 01/17/2015   OSA on CPAP 01/17/2015   Asthma, chronic 09/25/2014   Nocturia more than twice per night 09/25/2014   Diabetic neuropathy, type II diabetes mellitus (HCC) 09/25/2014   Primary snoring 09/25/2014   Myalgia 09/25/2014   Diabetes mellitus (HCC)    Goiter    Depression    Asthma    SVT (supraventricular tachycardia) (HCC)    Vasculitis (HCC)    GERD (gastroesophageal reflux disease)    FIBROMYALGIA 07/14/2009   PROTEINURIA, MILD 07/14/2009   DIABETIC PERIPHERAL NEUROPATHY 12/04/2007   Hyperlipidemia 12/04/2007   SUPRAVENTRICULAR TACHYCARDIA, HX OF 12/04/2007   DIVERTICULOSIS, COLON 07/12/2007   Hypothyroidism 04/06/2007   DIABETES MELLITUS, TYPE I 04/06/2007   DEPRESSION 04/06/2007   Discharge Diagnoses:  Patient Active Problem List   Diagnosis Date Noted   H/O aortic valve replacement with tissue graft INSPIRIS RESILIA  AORTIC VALVE 05/31/2021 06/12/2021   S/P AVR 06/05/2021   Dyspnea on exertion    Aortic stenosis 04/27/2021   SBO (small bowel obstruction) (HCC) 02/09/2018   Allergic rhinitis 01/09/2018   Diabetic neuropathy (HCC) 06/30/2017   Vitamin D deficiency 06/30/2017   Aortic regurgitation 06/30/2017   Mitral valve prolapse 06/30/2017   Anxiety and depression 06/30/2017   History of nocturia  01/17/2015   OSA on CPAP 01/17/2015   Asthma, chronic 09/25/2014   Nocturia more than twice per night 09/25/2014   Diabetic neuropathy, type II diabetes mellitus (HCC) 09/25/2014   Primary snoring 09/25/2014   Myalgia 09/25/2014   Diabetes mellitus (HCC)    Goiter    Depression    Asthma    SVT (supraventricular tachycardia) (HCC)    Vasculitis (HCC)    GERD (gastroesophageal reflux disease)    FIBROMYALGIA 07/14/2009   PROTEINURIA, MILD 07/14/2009   DIABETIC PERIPHERAL NEUROPATHY 12/04/2007   Hyperlipidemia 12/04/2007   SUPRAVENTRICULAR TACHYCARDIA, HX OF 12/04/2007   DIVERTICULOSIS, COLON 07/12/2007   Hypothyroidism 04/06/2007   DIABETES MELLITUS, TYPE I 04/06/2007   DEPRESSION 04/06/2007   Discharged Condition: stable  History of Present Illness:  The patient is a 70 year old woman with a history of diabetes, fibromyalgia, hypertension, anxiety and depression, and severe aortic stenosis who was referred by Dr. Jacinto Halim for consideration of aortic valve replacement.  Her most recent echo on 03/25/2021 showed a bicuspid aortic valve with a raphae between the left and right coronary cusp.  The valve was moderately calcified with restricted leaflet mobility.  The V-max was 4.0 m/s with a mean gradient of 41 mmHg consistent with severe aortic stenosis.  Aortic valve area was 0.6 cm.  There is mild aortic insufficiency and mild mitral regurgitation.  Left ventricular ejection fraction was 55 to 60% with moderate concentric LVH and grade 2 diastolic dysfunction.  She was evaluated by  Dr. Laneta Simmers at [facility].  At her visit she reported progressive exertional shortness of breath over the past 6 months.  She has a history of asthma with occasional episodes but thought that her increased symptoms were due to her asthma.  They were not getting better with use of her rescue inhaler.  She has also had substernal chest pressure with physical activity.  She reports orthopnea and  frequently has to sit up in bed.  She has had no dizziness or syncope.  She has had exertional fatigue.  It was felt the patient should undergo replacement of her Aortic Valve.  The risks and benefits of the procedure were explained to the patient and she was agreeable to proceed.  Hospital Course:  Mrs. Bieker presented to [facility] on 06/05/21.  She was taken to the operating room and underwent Aortic Valve Replacement with a 27 mm Edwards Resilia Inspiris Bioprosthetic heart valve.  She tolerated the procedure without difficulty and was taken to the SICU in stable condition.   She was extubated the evening of surgery without difficulty. She was weaned off Neo Synephrine drip. Theone Murdoch, a line, chest tubes, and foley were all removed early in her post operative course. She had expected post op blood loss anemia. Her last H and H was 10.3 and 32.2. She had thrombocytopenia. Her last platelet count was up to 148,000. She was transitioned off the Insulin drip. Her HGA1C pre op was 7.1. She has an Insulin pump but because she was groggy in the evenings, on Insulin. Will have patient resume pump at discharge. She was felt stable for transfer to the floor on 09/25;however, a bed was not available until 09/27.   On 06/08/2021 she remains quite stable.  Epicardial pacing wires were removed on 09/26.She still continues to have a moderate oxygen requirement and was placed on CPAP overnight.  She is maintaining sinus rhythm but is having PACs and PVCs.  She is diuresing well.  Chest tubes and pacing wires are removed.  PICC line was placed as she had poor venous access.  On the fourth postoperative day, she developed atrial fibrillation with RVR.  She had ventricular rates as high as 180 but only briefly.  She was started on amiodarone infusion.  Since she only had a small peripheral IV, questing placement of a PICC line. She did continue to have some episodes of SVT and beta-blocker was uptitrated.  She was  transitioned to oral Amiodarone on 09/28. She has been tolerating a diet but has not had a bowel movement. She was given a laxative with success. Her sternal wound is clean and dry. She is still volume overloaded and is requiring diuresis. Unfortunately, her a fib rate was still not well controlled. Cardizem 60 mg QID was added on 09/29. She was started on Apixaban 5 mg bid and Digoxin 0.25 mg daily on 09/30.  Her heart rhythm and rate were continued to be monitored closely and this has stabilized with no further episodes of supraventricular tachycardias.  Her final medication regimen is listed below.  At the time of discharge the patient was feeling well.  Her incisions continue to heal without evidence of infection.  Oxygen was completely weaned and was maintaining good saturations on room air.  She was tolerating routine cardiac rehab modalities without difficulty.  She was felt to be quite stable for discharge on 06/13/2021.  Consults: None  Significant Diagnostic Studies: cardiac graphics:   Echocardiogram:  1. Left ventricle cavity is normal in size. Moderate concentric hypertrophy of the left ventricle. Normal global wall motion. Normal LV systolic function with visual EF 55-60%. Doppler evidence of grade II (pseudonormal) diastolic dysfunction, elevated LAP. 2. Bicuspid aortic valve with raphe between left and right coronary cusp. Moderate calcification. Severe aortic stenosis. Vmax 4.0 m/sec, mean PG 4 mmHg, AVA 0.6 cm2 by continuity equation. Mild aortic regurgitation. 3. Mild (Grade I) mitral regurgitation. 4. Mild tricuspid regurgitation. 5. No evidence of pulmonary hypertension. 6. Compared to previous studies in 2020 and 2021, aortic stenosis severity is increased from moderate.  Treatments: surgery:      Discharge Exam: Blood pressure (!) 128/57, pulse 84, temperature 98.5 F (36.9 C), temperature source Oral, resp. rate 20, height 5\' 3"  (1.6 m), weight 79.7 kg, SpO2 93  %. General appearance: alert, cooperative, and no distress Heart: regular rate and rhythm Lungs: clear to auscultation bilaterally Abdomen: benign Extremities: no edema Wound: incis healing well     Discharge Medications:  The patient has been discharged on:   1.Beta Blocker:  Yes [ x  ]                              No   [   ]                              If No, reason:  2.Ace Inhibitor/ARB: Yes [   ]                                     No  [   x ]                                     If No, reason:Labile BP  3.Statin:   Yes [  x ]                  No  [   ]                  If No, reason:  4.Ecasa:  Yes  [  x ]                  No   [   ]                  If No, reason:  Patient had ACS upon admission:NO  Plavix/P2Y12 inhibitor: Yes [   ]                                      No  [ x  ]     Discharge Instructions     Amb Referral to Cardiac Rehabilitation   Complete by: As directed    Diagnosis: Valve Replacement   Valve: Aortic   After initial evaluation and assessments completed: Virtual Based Care may be provided alone or in conjunction with Phase 2 Cardiac Rehab based on patient barriers.: Yes   Discharge patient   Complete by: As directed    Discharge disposition: 01-Home or Self Care   Discharge patient date: 06/13/2021  Allergies as of 06/13/2021       Reactions   Penicillins Anaphylaxis   Has patient had a PCN reaction causing immediate rash, facial/tongue/throat swelling, SOB or lightheadedness with hypotension: No Has patient had a PCN reaction causing severe rash involving mucus membranes or skin necrosis: No Has patient had a PCN reaction that required hospitalization: Yes/ Has patient had a PCN reaction occurring within the last 10 years: No If all of the above answers are "NO", then may proceed with Cephalosporin use.   Ace Inhibitors    INDUCED BRONCHOSPASM        Medication List     STOP taking these medications    amLODipine  10 MG tablet Commonly known as: NORVASC   atenolol 100 MG tablet Commonly known as: TENORMIN   losartan-hydrochlorothiazide 100-25 MG tablet Commonly known as: HYZAAR   potassium chloride SA 20 MEQ tablet Commonly known as: KLOR-CON       TAKE these medications    acetaminophen 500 MG tablet Commonly known as: TYLENOL Take 500 mg by mouth every 6 (six) hours as needed for headache.   albuterol 108 (90 Base) MCG/ACT inhaler Commonly known as: VENTOLIN HFA Inhale 2 puffs into the lungs every 6 (six) hours as needed. What changed: reasons to take this   albuterol (2.5 MG/3ML) 0.083% nebulizer solution Commonly known as: PROVENTIL Take 3 mLs (2.5 mg total) by nebulization every 6 (six) hours as needed. [id number] What changed: reasons to take this   ALPRAZolam 1 MG tablet Commonly known as: XANAX TAKE ONE-HALF TABLET BY MOUTH ONCE DAILY AS NEEDED FOR STRESS What changed:  how much to take how to take this when to take this reasons to take this additional instructions   amiodarone 200 MG tablet Commonly known as: PACERONE Take 1 tablet (200 mg total) by mouth 2 (two) times daily.   apixaban 5 MG Tabs tablet Commonly known as: ELIQUIS Take 1 tablet (5 mg total) by mouth 2 (two) times daily.   aspirin 81 MG EC tablet Take 1 tablet (81 mg total) by mouth daily. Swallow whole.   atorvastatin 40 MG tablet Commonly known as: LIPITOR Take 1 tablet (40 mg total) daily by mouth.   augmented betamethasone dipropionate 0.05 % cream Commonly known as: DIPROLENE-AF Apply 1 application topically 2 (two) times daily as needed (eczema).   B-12 5000 MCG Caps Take 5,000 mcg by mouth daily.   Biotin 53976 MCG Tabs Take 10,000 mcg by mouth daily.   carboxymethylcellulose 0.5 % Soln Commonly known as: REFRESH PLUS Place 1 drop into both eyes 3 (three) times daily as needed (dry eyes).   digoxin 0.25 MG tablet Commonly known as: LANOXIN Take 1 tablet (0.25 mg total) by  mouth daily.   diltiazem 240 MG 24 hr capsule Commonly known as: CARDIZEM CD Take 1 capsule (240 mg total) by mouth daily.   Glucose 15 g Pack Take 15-30 g by mouth daily as needed (low blood sugar).   insulin aspart 100 UNIT/ML injection Commonly known as: novoLOG Inject into the skin continuous. Uses in pump basal rate 3.0 units per hour, bolus 1 unit per 30 carbs   L-Lysine 500 MG Caps Take 500 mg by mouth daily.   levocetirizine 5 MG tablet Commonly known as: Xyzal Allergy 24HR Take 1 tablet (5 mg total) by mouth every evening.   levothyroxine 125 MCG tablet Commonly known as: SYNTHROID Take 125 mcg by mouth daily before breakfast.   Magnesium 500 MG Tabs Take 500  mg by mouth at bedtime.   Melatonin 10 MG Tabs Take 10 mg by mouth at bedtime.   omeprazole 40 MG capsule Commonly known as: PRILOSEC Take 40 mg by mouth daily.   OVER THE COUNTER MEDICATION Take 4 tablets by mouth at bedtime. Focus factor otc supplement   PROBIOTIC PO Take 1 capsule by mouth daily. 20 billion active cultures   rOPINIRole 0.5 MG tablet Commonly known as: REQUIP Take 0.5 mg by mouth at bedtime.   sertraline 100 MG tablet Commonly known as: ZOLOFT Take 100 mg by mouth daily.   simethicone 80 MG chewable tablet Commonly known as: Gas-X Chew 1 tablet (80 mg total) by mouth 4 (four) times daily as needed for flatulence.   traMADol 50 MG tablet Commonly known as: ULTRAM Take 1 tablet (50 mg total) by mouth every 6 (six) hours as needed for up to 7 days for moderate pain.   Vitamin D 50 MCG (2000 UT) Caps Take 0.5 capsules (1,000 Units total) by mouth daily.        Follow-up Information     [facility] [place] Follow up.   Specialty: Cardiothoracic Surgery Why: Appointment is at    with nurse for suture removal Contact information: [address] [place], [address] [place] [address] [phone]        Alleen Borne, MD  Follow up.   Specialty: Cardiothoracic Surgery Why: Appointment is at    . Please get a CXR 30 min prior to your appointment at [facility] located on first floor of our office building Contact information: [address] [place] [address] [phone]         Yates Decamp, MD. Go on 06/22/2021.   Specialty: Cardiology Why: Appointment time is at 1:00 pm Contact information: [address] [place] [address] [phone]         [facility] Follow up.   Why: rolling walker arranged- to be delivered to room prior to discharge Contact information: [address] [facility] [address] [place] [address] [phone]         [facility] Follow up.   Specialty: Home Health Services Why: [facility] referral pre-op from [facility] office for any HH needs- they will follow up with you post discharge (new name [facility]) Contact information: [address] [place] [address] [phone]                 Signed: Doree Fudge PA-C 06/14/2021, 9:16 AM

## 2021-06-05 NOTE — Op Note (Signed)
CARDIOVASCULAR SURGERY OPERATIVE NOTE  06/05/2021 [name] [id number]  Surgeon:  Alleen Borne, MD  First Assistant: Lowella Dandy,  PA-C   Preoperative Diagnosis:  Severe aortic stenosis, moderate AI.   Postoperative Diagnosis:  Same   Procedure:  Median Sternotomy Extracorporeal circulation 3.   Aortic valve replacement using a 27 mm Edwards INSPIRIS RESILIA pericardial valve.  Anesthesia:  General Endotracheal   Clinical History/Surgical Indication:  This 70 year old woman has stage D, severe, symptomatic aortic stenosis with New York Heart Association class III symptoms of exertional fatigue and shortness of breath consistent with chronic diastolic congestive heart failure.  She also has substernal chest pressure with exertion as well as at rest when laying down and has orthopnea.  I have personally reviewed her 2D echocardiogram, cardiac catheterization, and CTA studies.  Her echocardiogram shows a bicuspid aortic valve with moderate calcification and thickening of the leaflets and restricted leaflet mobility.  The mean gradient is 41 mmHg consistent with severe aortic stenosis.  Left ventricular ejection fraction is normal.  Cardiac catheterization showed normal coronary arteries with a mean gradient across aortic valve of 45 mmHg and a peak to peak gradient of 50 mmHg consistent with severe aortic stenosis.  I agree that aortic valve replacement is indicated in this patient for relief of her symptoms and to prevent progressive left ventricular deterioration.  I discussed the alternatives of open surgical aortic valve replacement and transcatheter aortic valve replacement.  She said that she has not interested in having TAVR and would rather have open surgical aortic valve replacement.  She is concerned about lack of long-term durability data and she had a friend who had TAVR that had a difficult time.  She does have a calcified bicuspid aortic valve that is at the upper  limit of the range for a 29 mm SAPIEN valve.  I think that open surgical aortic valve replacement is a reasonable option for treating her.  I would use a bioprosthetic valve given her age. I discussed the operative procedure with the patient and family including alternatives, benefits and risks; including but not limited to bleeding, blood transfusion, infection, stroke, myocardial infarction, graft failure, heart block requiring a permanent pacemaker, organ dysfunction, and death.  [name] understands and agrees to proceed.    Preparation:  The patient was seen in the preoperative holding area and the correct patient, correct operation were confirmed with the patient after reviewing the medical record and catheterization. The consent was signed by me. Preoperative antibiotics were given. A pulmonary arterial line and radial arterial line were placed by the anesthesia team. The patient was taken back to the operating room and positioned supine on the operating room table. After being placed under general endotracheal anesthesia by the anesthesia team a foley catheter was placed. The neck, chest, abdomen, and both legs were prepped with betadine soap and solution and draped in the usual sterile manner. A surgical time-out was taken and the correct patient and operative procedure were confirmed with the nursing and anesthesia staff.   Pre-bypass TEE:   Complete TEE assessment was performed by Dr. Shona Simpson. This showed a bicuspid aortic valve with severe AS, moderate AI, normal LV systolic function, mild MR.    Post-bypass TEE:   Normal functioning prosthetic aortic valve with no perivalvular leak or regurgitation through the valve. Left ventricular function preserved. Unchanged mild mitral regurgitation.    Cardiopulmonary Bypass:  A median sternotomy was performed. The pericardium was opened in the midline.  Right ventricular function appeared normal. The ascending aorta was of  normal size and had no palpable plaque. There were no contraindications to aortic cannulation or cross-clamping. The patient was fully systemically heparinized and the ACT was maintained > 400 sec. The proximal aortic arch was cannulated with a 22 F aortic cannula for arterial inflow. Venous cannulation was performed via the right atrial appendage using a two-staged venous cannula. An antegrade cardioplegia/vent cannula was inserted into the mid-ascending aorta. A left ventricular vent was placed via the right superior pulmonary vein. A retrograde cardioplegia cannnula was placed into the coronary sinus via the right atrium. Aortic occlusion was performed with a single cross-clamp. Systemic cooling to 32 degrees Centigrade and topical cooling of the heart with iced saline were used. 1200 cc of cold KBC cardioplegia was used antegrade and retrograde to induce diastolic arrest and then warm KBC retrograde cardioplegia was given prior to removing the cross clamp for reanimation.  A temperature probe was inserted into the interventricular septum and an insulating pad was placed in the pericardium. Carbon dioxide was insufflated into the pericardium at 5L/min throughout the procedure to minimize intracardiac air.   Aortic Valve Replacement:   A transverse aortotomy was performed 1 cm above the take-off of the right coronary artery. The native valve was bicuspid with fusion of the left and right cusps and a single raphe with calcified leaflets and mild annular calcification. The ostia of the coronary arteries were in normal position and were not obstructed. The native valve leaflets were excised and the annulus was decalcified with rongeurs. Care was taken to remove all particulate debris. The left ventricle was directly inspected for debris and then irrigated with ice saline solution. The annulus was sized and a size 49mm Edwards INSPRIS RESILIA pericardial valve was chosen. The model number was [id number] and the  serial number was [id number]. While the valve was being prepared 2-0 Ethibond pledgeted horizontal mattress sutures were placed around the annulus with the pledgets in a sub-annular position. The sutures were placed through the sewing ring and the valve lowered into place. The sutures were tied sequentially using Corknot. The valve seated nicely and the coronary ostia were not obstructed. The prosthetic valve leaflets moved normally and there was no sub-valvular obstruction. The aortotomy was closed using 4-0 Prolene suture in 2 layers with felt strips to reinforce the closure.  Completion:  The patient was rewarmed to 37 degrees Centigrade. De-airing maneuvers were performed and the head placed in trendelenburg position. The crossclamp was removed with a time of 74 minutes. There was spontaneous return of sinus rhythm. The aortotomy was checked for hemostasis. Two temporary epicardial pacing wires were placed on the right atrium and two on the right ventricle. The left ventricular vent and retrograde cardioplegia cannulas were removed. The patient was weaned from CPB without difficulty on no inotropes. CPB time was 94 minutes. Cardiac output was 5 LPM. Heparin was fully reversed with protamine and the aortic and venous cannulas removed. Hemostasis was achieved. Mediastinal drainage tubes were placed. The sternum was closed with  #6 stainless steel wires. The fascia was closed with continuous # 1 vicryl suture. The subcutaneous tissue was closed with 2-0 vicryl continuous suture. The skin was closed with 3-0 vicryl subcuticular suture. All sponge, needle, and instrument counts were reported correct at the end of the case. Dry sterile dressings were placed over the incisions and around the chest tubes which were connected to pleurevac suction. The patient was then transported to the  surgical intensive care unit in stable condition.

## 2021-06-05 NOTE — Anesthesia Procedure Notes (Signed)
Arterial Line Insertion Start/End9/23/2022 7:25 AM, 06/05/2021 7:28 AM Performed by: Shelton Silvas, MD, anesthesiologist  Patient location: Pre-op. Preanesthetic checklist: patient identified, IV checked, site marked, risks and benefits discussed, surgical consent, monitors and equipment checked, pre-op evaluation, timeout performed and anesthesia consent Lidocaine 1% used for infiltration Left, radial was placed Catheter size: 20 G Hand hygiene performed  and maximum sterile barriers used   Attempts: 1 Procedure performed without using ultrasound guided technique. Following insertion, dressing applied and Biopatch. Post procedure assessment: normal and unchanged  Patient tolerated the procedure well with no immediate complications.

## 2021-06-05 NOTE — Progress Notes (Addendum)
Patient passed weaning parameters except ABG resulted with a pH of 7.297. Paged Bartle MD and received verbal orders to give 1 amp bicarb and extubate.  Post extubation ABG pCO2 was 55. Paged Joycelyn Man PA and received orders for bipap for this patient. Will continue to monitor throughout shift

## 2021-06-05 NOTE — Discharge Instructions (Signed)

## 2021-06-05 NOTE — Anesthesia Procedure Notes (Signed)
Central Venous Catheter Insertion Performed by: Shelton Silvas, MD, anesthesiologist Start/End9/23/2022 7:00 AM, 06/05/2021 7:10 AM Patient location: Pre-op. Preanesthetic checklist: patient identified, IV checked, site marked, risks and benefits discussed, surgical consent, monitors and equipment checked, pre-op evaluation, timeout performed and anesthesia consent Position: Trendelenburg Lidocaine 1% used for infiltration and patient sedated Hand hygiene performed , maximum sterile barriers used  and Seldinger technique used Catheter size: 8.5 Fr Total catheter length 10. Central line was placed.Sheath introducer Swan type:thermodilution PA Cath depth:50 Procedure performed using ultrasound guided technique. Ultrasound Notes:anatomy identified, needle tip was noted to be adjacent to the nerve/plexus identified, no ultrasound evidence of intravascular and/or intraneural injection and image(s) printed for medical record Attempts: 1 Following insertion, line sutured, dressing applied and Biopatch. Post procedure assessment: blood return through all ports, free fluid flow and no air  Patient tolerated the procedure well with no immediate complications.

## 2021-06-05 NOTE — Anesthesia Postprocedure Evaluation (Signed)
Anesthesia Post Note  Patient: [name]  Procedure(s) Performed: AORTIC VALVE REPLACEMENT (AVR), USING INSPIRIS RESILIA  AORTIC VALVE (Chest) TRANSESOPHAGEAL ECHOCARDIOGRAM (TEE)     Patient location during evaluation: SICU Anesthesia Type: General Level of consciousness: sedated Pain management: pain level controlled Vital Signs Assessment: post-procedure vital signs reviewed and stable Respiratory status: patient remains intubated per anesthesia plan Cardiovascular status: stable Postop Assessment: no apparent nausea or vomiting Anesthetic complications: no   No notable events documented.  Last Vitals:  Vitals:   06/05/21 1500 06/05/21 1515  BP: 107/63   Pulse:    Resp: 13 14  Temp: (!) 36.2 C (!) 36.2 C  SpO2: 97% 97%    Last Pain:  Vitals:   06/05/21 7505  TempSrc: Oral  PainSc:                  Shelton Silvas

## 2021-06-05 NOTE — Transfer of Care (Signed)
Immediate Anesthesia Transfer of Care Note  Patient: [name]  Procedure(s) Performed: AORTIC VALVE REPLACEMENT (AVR), USING INSPIRIS RESILIA  AORTIC VALVE (Chest) TRANSESOPHAGEAL ECHOCARDIOGRAM (TEE)  Patient Location: ICU  Anesthesia Type:General  Level of Consciousness: drowsy, patient cooperative and Patient remains intubated per anesthesia plan  Airway & Oxygen Therapy: Patient remains intubated per anesthesia plan and Patient placed on Ventilator (see vital sign flow sheet for setting)  Post-op Assessment: Report given to RN and Post -op Vital signs reviewed and stable  Post vital signs: Reviewed and stable  Last Vitals:  Vitals Value Taken Time  BP    Temp 35.4 C 06/05/21 1217  Pulse    Resp 14 06/05/21 1217  SpO2 97 % 06/05/21 1217  Vitals shown include unvalidated device data.  Last Pain:  Vitals:   06/05/21 7893  TempSrc: Oral  PainSc:          Complications: No notable events documented.

## 2021-06-05 NOTE — Progress Notes (Signed)
Inpatient Diabetes Program Recommendations  AACE/ADA: New Consensus Statement on Inpatient Glycemic Control (2015)  Target Ranges:  Prepandial:   less than 140 mg/dL      Peak postprandial:   less than 180 mg/dL (1-2 hours)      Critically ill patients:  140 - 180 mg/dL   Lab Results  Component Value Date   GLUCAP 128 (H) 06/05/2021   HGBA1C 7.1 (H) 06/03/2021    Review of Glycemic Control  Diabetes history: DM1 Outpatient Diabetes medications: Insulin pump basal 3 units/hr, meal coverage 1 unit per 30 gms carbohydrate Current orders for Inpatient glycemic control: IV insulin  Inpatient Diabetes Program Recommendations:   When ready to transition to insulin pump, overlap insulin pump via 1 hr. Will follow during hospitalization.  Thank you, Billy Fischer. Sandra Brents, RN, MSN, CDE  Diabetes Coordinator Inpatient Glycemic Control Team Team Pager [phone] (8am-5pm) 06/05/2021 2:28 PM

## 2021-06-05 NOTE — Anesthesia Procedure Notes (Signed)
Procedure Name: Intubation Date/Time: 06/05/2021 8:02 AM Performed by: Rosiland Oz, CRNA Pre-anesthesia Checklist: Patient identified, Emergency Drugs available, Suction available, Patient being monitored and Timeout performed Patient Re-evaluated:Patient Re-evaluated prior to induction Oxygen Delivery Method: Circle system utilized Preoxygenation: Pre-oxygenation with 100% oxygen Induction Type: IV induction Ventilation: Mask ventilation without difficulty Laryngoscope Size: Miller and 3 Grade View: Grade I Tube type: Oral Tube size: 7.5 mm Number of attempts: 1 Airway Equipment and Method: Stylet Placement Confirmation: ETT inserted through vocal cords under direct vision, positive ETCO2 and breath sounds checked- equal and bilateral Secured at: 22 cm Tube secured with: Tape Dental Injury: Teeth and Oropharynx as per pre-operative assessment

## 2021-06-05 NOTE — Brief Op Note (Signed)
06/05/2021  10:38 AM  PATIENT:  [name]  70 y.o. female  PRE-OPERATIVE DIAGNOSIS:  SEVERE AS  POST-OPERATIVE DIAGNOSIS:  SEVERE AS  PROCEDURE:  Procedure(s):  AORTIC VALVE REPLACEMENT  - INSPIRIS RESILIA  AORTIC VALVE (N/A)   TRANSESOPHAGEAL ECHOCARDIOGRAM (TEE) (N/A)  SURGEON:  Surgeon(s) and Role:    * Alleen Borne, MD - Primary  PHYSICIAN ASSISTANT: Lowella Dandy PA-C   ASSISTANTS: Tanda Rockers RNFA   ANESTHESIA:   general   BLOOD ADMINISTERED:   CC CELLSAVER  DRAINS:  Mediastinal Chest Tubes    LOCAL MEDICATIONS USED:  NONE  SPECIMEN:  Source of Specimen:  Aortic Valve Leaflets  DISPOSITION OF SPECIMEN:  PATHOLOGY  COUNTS:  YES  TOURNIQUET:  * No tourniquets in log *  DICTATION: .Dragon Dictation  PLAN OF CARE: Admit to inpatient   PATIENT DISPOSITION:  ICU - intubated and hemodynamically stable.   Delay start of Pharmacological VTE agent (>24hrs) due to surgical blood loss or risk of bleeding: yes

## 2021-06-05 NOTE — OR Nursing (Signed)
Forty-five minute call to 2 Heart Charge nurse at 1058. Spoke to [place].

## 2021-06-05 NOTE — Interval H&P Note (Signed)
History and Physical Interval Note:  06/05/2021 6:22 AM  [name]  has presented today for surgery, with the diagnosis of SEVERE AS.  The various methods of treatment have been discussed with the patient and family. After consideration of risks, benefits and other options for treatment, the patient has consented to  Procedure(s): AORTIC VALVE REPLACEMENT (AVR) (N/A) TRANSESOPHAGEAL ECHOCARDIOGRAM (TEE) (N/A) as a surgical intervention.  The patient's history has been reviewed, patient examined, no change in status, stable for surgery.  I have reviewed the patient's chart and labs.  Questions were answered to the patient's satisfaction.     Alleen Borne

## 2021-06-05 NOTE — Progress Notes (Addendum)
EVENING ROUNDS NOTE :     [address]       [name] [address]             [phone]                 Day of Surgery Procedure(s) (LRB): AORTIC VALVE REPLACEMENT (AVR), USING INSPIRIS RESILIA  AORTIC VALVE (N/A) TRANSESOPHAGEAL ECHOCARDIOGRAM (TEE) (N/A)  Total Length of Stay:  LOS: 0 days  BP 107/63   Pulse (!) 48   Temp (!) 97.2 F (36.2 C)   Resp 14   Ht 5\' 3"  (1.6 m)   Wt 79.4 kg   SpO2 97%   BMI 31.00 kg/m   .Intake/Output      09/22 0701 09/23 0700 09/23 0701 09/24 0700   I.V. (mL/kg)  2420.7 (30.5)   Blood  476   IV Piggyback  767.7   Total Intake(mL/kg)  3664.4 (46.2)   Urine (mL/kg/hr)  2275 (3.4)   Blood  800   Chest Tube  100   Total Output  3175   Net  +489.4           sodium chloride     [START ON 06/06/2021] sodium chloride     sodium chloride 10 mL/hr at 06/05/21 1255   albumin human 12.5 g (06/05/21 1345)   dexmedetomidine (PRECEDEX) IV infusion 0.2 mcg/kg/hr (06/05/21 1500)   insulin 3.2 Units/hr (06/05/21 1500)   lactated ringers     lactated ringers     lactated ringers 20 mL/hr at 06/05/21 1311   magnesium sulfate 20 mL/hr at 06/05/21 1500   nitroGLYCERIN     phenylephrine (NEO-SYNEPHRINE) Adult infusion 20 mcg/min (06/05/21 1500)   potassium chloride 50 mL/hr at 06/05/21 1500   vancomycin       Lab Results  Component Value Date   WBC 16.1 (H) 06/05/2021   HGB 12.6 06/05/2021   HCT 37.0 06/05/2021   PLT 132 (L) 06/05/2021   GLUCOSE 186 (H) 06/05/2021   CHOL 147 01/09/2018   TRIG 252 (H) 01/09/2018   HDL 40 (L) 01/09/2018   LDLCALC 74 01/09/2018   ALT 19 06/03/2021   AST 27 06/03/2021   NA 141 06/05/2021   K 3.5 06/05/2021   CL 100 06/05/2021   CREATININE 1.00 06/05/2021   BUN 19 06/05/2021   CO2 21 (L) 06/03/2021   TSH 1.82 01/09/2018   INR 1.3 (H) 06/05/2021   HGBA1C 7.1 (H) 06/03/2021   MICROALBUR 19.08 (H) 02/12/2014  A paced at 90. On Neo Synephrine drip at 30 mcg/min, Precedex, and Insulin  drips CO/CI 4.4/2.4 Chest tube 110 cc thus far Hope to continue to wean to extubate over next few hours   04/14/2014 PA-C 06/05/2021 3:22 PM   Agree with above 06/07/2021

## 2021-06-05 NOTE — Procedures (Signed)
Extubation Procedure Note  Patient Details:   Name: [name] DOB: 10/19/1950 MRN: [id number]   Airway Documentation:    Vent end date: 06/05/21 Vent end time: 1650   Evaluation  O2 sats: currently acceptable Complications: No apparent complications Patient did tolerate procedure well. Bilateral Breath Sounds: Clear, Diminished   Patient passed weaning parameters with NIF -22, VC-700 & positive cuff leak. However ABG showed pH-7.297 so Dr Laneta Simmers was paged. He said to give 1 amp Bicarb & extubate. Pt extubated to 4L [place]. Able to speak but has weak cough currently. She is alert & oriented but just still seems sleepy. Will continue to monitor.  Jacqulynn Cadet 06/05/2021, 5:06 PM

## 2021-06-05 NOTE — Anesthesia Procedure Notes (Signed)
Central Venous Catheter Insertion Performed by: Shelton Silvas, MD, anesthesiologist Start/End9/23/2022 7:10 AM, 06/05/2021 7:15 AM Patient location: Pre-op. Preanesthetic checklist: patient identified, IV checked, site marked, risks and benefits discussed, surgical consent, monitors and equipment checked, pre-op evaluation, timeout performed and anesthesia consent Hand hygiene performed  and maximum sterile barriers used  PA cath was placed.Swan type:thermodilution Procedure performed without using ultrasound guided technique. Attempts: 1 Patient tolerated the procedure well with no immediate complications.

## 2021-06-05 NOTE — Anesthesia Preprocedure Evaluation (Addendum)
Anesthesia Evaluation  Patient identified by MRN, date of birth, ID band Patient awake    Reviewed: Allergy & Precautions, NPO status , Patient's Chart, lab work & pertinent test results  Airway Mallampati: III  TM Distance: >3 FB     Dental  (+) Dental Advisory Given, Poor Dentition, Missing,    Pulmonary asthma , sleep apnea , former smoker,    breath sounds clear to auscultation       Cardiovascular hypertension, Pt. on medications and Pt. on home beta blockers + Valvular Problems/Murmurs AS, AI and MR  Rhythm:Regular Rate:Bradycardia + Systolic murmurs Echo: Left ventricle cavity is normal in size. Moderate concentric hypertrophy  of the left ventricle. Normal global wall motion. Normal LV systolic  function with visual EF 55-60%. Doppler evidence of grade II  (pseudonormal) diastolic dysfunction, elevated LAP.  Bicuspid aortic valve with raphe between left and right coronary cusp.  Moderate calcification. Severe aortic stenosis. Vmax 4.0 m/sec, mean PG 4  mmHg, AVA 0.6 cm2 by continuity equation. Mild aortic regurgitation.  Mild (Grade I) mitral regurgitation.  Mild tricuspid regurgitation.  No evidence of pulmonary hypertension.  Compared to previous studies in 2020 and 2021, aortic stenosis severity is  increased from moderate.   Neuro/Psych PSYCHIATRIC DISORDERS Anxiety Depression  Neuromuscular disease    GI/Hepatic Neg liver ROS, GERD  Medicated,  Endo/Other  diabetesHypothyroidism   Renal/GU negative Renal ROS     Musculoskeletal  (+) Arthritis , Fibromyalgia -  Abdominal Normal abdominal exam  (+)   Peds  Hematology negative hematology ROS (+)   Anesthesia Other Findings   Reproductive/Obstetrics                            Anesthesia Physical Anesthesia Plan  ASA: 4  Anesthesia Plan: General   Post-op Pain Management:    Induction: Intravenous  PONV Risk Score and  Plan: 2 and Ondansetron  Airway Management Planned: Oral ETT  Additional Equipment: Arterial line, CVP, PA Cath, TEE and Ultrasound Guidance Line Placement  Intra-op Plan:   Post-operative Plan: Extubation in OR  Informed Consent: I have reviewed the patients History and Physical, chart, labs and discussed the procedure including the risks, benefits and alternatives for the proposed anesthesia with the patient or authorized representative who has indicated his/her understanding and acceptance.     Dental advisory given  Plan Discussed with: CRNA  Anesthesia Plan Comments:         Anesthesia Quick Evaluation

## 2021-06-06 ENCOUNTER — Inpatient Hospital Stay (HOSPITAL_COMMUNITY): Payer: Medicare Other

## 2021-06-06 LAB — BASIC METABOLIC PANEL
Anion gap: 9 (ref 5–15)
Anion gap: 7 (ref 5–15)
BUN: 12 mg/dL (ref 8–23)
BUN: 13 mg/dL (ref 8–23)
CO2: 23 mmol/L (ref 22–32)
CO2: 25 mmol/L (ref 22–32)
Calcium: 8.1 mg/dL — ABNORMAL LOW (ref 8.9–10.3)
Calcium: 8.1 mg/dL — ABNORMAL LOW (ref 8.9–10.3)
Chloride: 107 mmol/L (ref 98–111)
Chloride: 105 mmol/L (ref 98–111)
Creatinine, Ser: 0.82 mg/dL (ref 0.44–1.00)
Creatinine, Ser: 0.95 mg/dL (ref 0.44–1.00)
GFR, Estimated: >60 mL/min (ref >60)
GFR, Estimated: >60 mL/min (ref >60)
Glucose, Bld: 154 mg/dL — ABNORMAL HIGH (ref 70–99)
Glucose, Bld: 161 mg/dL — ABNORMAL HIGH (ref 70–99)
Potassium: 3.7 mmol/L (ref 3.5–5.1)
Potassium: 4.4 mmol/L (ref 3.5–5.1)
Sodium: 139 mmol/L (ref 135–145)
Sodium: 137 mmol/L (ref 135–145)

## 2021-06-06 LAB — CBC
HCT: 34.2 % — ABNORMAL LOW (ref 36.0–46.0)
HCT: 33 % — ABNORMAL LOW (ref 36.0–46.0)
Hemoglobin: 11.1 g/dL — ABNORMAL LOW (ref 12.0–15.0)
Hemoglobin: 10.5 g/dL — ABNORMAL LOW (ref 12.0–15.0)
MCH: 28 pg (ref 26.0–34.0)
MCH: 27.6 pg (ref 26.0–34.0)
MCHC: 32.5 g/dL (ref 30.0–36.0)
MCHC: 31.8 g/dL (ref 30.0–36.0)
MCV: 86.1 fL (ref 80.0–100.0)
MCV: 86.8 fL (ref 80.0–100.0)
Platelets: 134 10*3/uL — ABNORMAL LOW (ref 150–400)
Platelets: 138 10*3/uL — ABNORMAL LOW (ref 150–400)
RBC: 3.97 MIL/uL (ref 3.87–5.11)
RBC: 3.8 MIL/uL — ABNORMAL LOW (ref 3.87–5.11)
RDW: 14.7 % (ref 11.5–15.5)
RDW: 15.1 % (ref 11.5–15.5)
WBC: 19.9 10*3/uL — ABNORMAL HIGH (ref 4.0–10.5)
WBC: 22.2 10*3/uL — ABNORMAL HIGH (ref 4.0–10.5)
nRBC: 0 % (ref 0.0–0.2)
nRBC: 0 % (ref 0.0–0.2)

## 2021-06-06 LAB — GLUCOSE, CAPILLARY
Glucose-Capillary: 112 mg/dL — ABNORMAL HIGH (ref 70–99)
Glucose-Capillary: 117 mg/dL — ABNORMAL HIGH (ref 70–99)
Glucose-Capillary: 122 mg/dL — ABNORMAL HIGH (ref 70–99)
Glucose-Capillary: 132 mg/dL — ABNORMAL HIGH (ref 70–99)
Glucose-Capillary: 137 mg/dL — ABNORMAL HIGH (ref 70–99)
Glucose-Capillary: 138 mg/dL — ABNORMAL HIGH (ref 70–99)
Glucose-Capillary: 139 mg/dL — ABNORMAL HIGH (ref 70–99)
Glucose-Capillary: 142 mg/dL — ABNORMAL HIGH (ref 70–99)
Glucose-Capillary: 144 mg/dL — ABNORMAL HIGH (ref 70–99)
Glucose-Capillary: 144 mg/dL — ABNORMAL HIGH (ref 70–99)
Glucose-Capillary: 156 mg/dL — ABNORMAL HIGH (ref 70–99)
Glucose-Capillary: 157 mg/dL — ABNORMAL HIGH (ref 70–99)
Glucose-Capillary: 160 mg/dL — ABNORMAL HIGH (ref 70–99)
Glucose-Capillary: 164 mg/dL — ABNORMAL HIGH (ref 70–99)
Glucose-Capillary: 169 mg/dL — ABNORMAL HIGH (ref 70–99)
Glucose-Capillary: 171 mg/dL — ABNORMAL HIGH (ref 70–99)

## 2021-06-06 LAB — MAGNESIUM
Magnesium: 2.4 mg/dL (ref 1.7–2.4)
Magnesium: 2.3 mg/dL (ref 1.7–2.4)

## 2021-06-06 MED ORDER — POTASSIUM CHLORIDE CRYS ER 20 MEQ PO TBCR
20.0000 meq | EXTENDED_RELEASE_TABLET | ORAL | Status: AC
Start: 2021-06-06 — End: 2021-06-06
  Administered 2021-06-06 (×3): 20 meq via ORAL
  Filled 2021-06-06 (×3): qty 1

## 2021-06-06 MED ORDER — ENOXAPARIN SODIUM 40 MG/0.4ML IJ SOSY
40.0000 mg | PREFILLED_SYRINGE | Freq: Every day | INTRAMUSCULAR | Status: DC
  Administered 2021-06-06 – 2021-06-11 (×6): 40 mg via SUBCUTANEOUS
  Filled 2021-06-06 (×6): qty 0.4

## 2021-06-06 MED ORDER — INSULIN ASPART 100 UNIT/ML IJ SOLN
0.0000 [IU] | INTRAMUSCULAR | Status: DC
  Administered 2021-06-06 – 2021-06-07 (×3): 3.6 [IU] via SUBCUTANEOUS

## 2021-06-06 MED ORDER — INFLUENZA VAC A&B SA ADJ QUAD 0.5 ML IM PRSY
0.5000 mL | PREFILLED_SYRINGE | INTRAMUSCULAR | Status: DC
  Filled 2021-06-06: qty 0.5

## 2021-06-06 NOTE — Progress Notes (Signed)
      [address]       [place] [address]             [phone]                 1 Day Post-Op Procedure(s) (LRB): AORTIC VALVE REPLACEMENT (AVR), USING INSPIRIS RESILIA  AORTIC VALVE (N/A) TRANSESOPHAGEAL ECHOCARDIOGRAM (TEE) (N/A)   Events: No events Extubated overnigh _______________________________________________________________ Vitals: BP (!) 143/67   Pulse 90   Temp 99.5 F (37.5 C)   Resp (!) 21   Ht 5\' 3"  (1.6 m)   Wt 85 kg   SpO2 96%   BMI 33.19 kg/m  Filed Weights   06/05/21 0638 06/06/21 0438  Weight: 79.4 kg 85 kg     - Neuro: alert NAD  - Cardiovascular: sinus in 70s  Drips: neo 5.   PAP: (24-45)/(10-28) 33/15 CO:  [3.9 L/min-5.7 L/min] 4.6 L/min CI:  [2.2 L/min/m2-3.1 L/min/m2] 2.5 L/min/m2  - Pulm: EWOB  ABG    Component Value Date/Time   PHART 7.306 (L) 06/05/2021 1738   PCO2ART 55.0 (H) 06/05/2021 1738   PO2ART 72 (L) 06/05/2021 1738   HCO3 27.4 06/05/2021 1738   TCO2 29 06/05/2021 1738   ACIDBASEDEF 3.0 (H) 06/05/2021 1637   O2SAT 92.0 06/05/2021 1738    - Abd: soft - Extremity: warm  .Intake/Output      09/23 0701 09/24 0700 09/24 0701 09/25 0700   I.V. (mL/kg) 2988 (35.2) 124.9 (1.5)   Blood 476    IV Piggyback 1146.8    Total Intake(mL/kg) 4610.9 (54.2) 124.9 (1.5)   Urine (mL/kg/hr) 3320 (1.6) 260 (0.7)   Blood 800    Chest Tube 250 80   Total Output 4370 340   Net +240.9 -215.1           _______________________________________________________________ Labs: CBC Latest Ref Rng & Units 06/06/2021 06/05/2021 06/05/2021  WBC 4.0 - 10.5 K/uL 19.9(H) 17.5(H) -  Hemoglobin 12.0 - 15.0 g/dL 11.1(L) 12.1 12.6  Hematocrit 36.0 - 46.0 % 34.2(L) 36.4 37.0  Platelets 150 - 400 K/uL 134(L) 140(L) -   CMP Latest Ref Rng & Units 06/06/2021 06/05/2021 06/05/2021  Glucose 70 - 99 mg/dL 06/07/2021) 280(K) -  BUN 8 - 23 mg/dL 12 13 -  Creatinine 349(Z - 1.00 mg/dL 7.91 5.05 -  Sodium 6.97 - 145 mmol/L 139 138 141   Potassium 3.5 - 5.1 mmol/L 3.7 4.0 4.2  Chloride 98 - 111 mmol/L 107 106 -  CO2 22 - 32 mmol/L 23 25 -  Calcium 8.9 - 10.3 mg/dL 8.1(L) 7.9(L) -  Total Protein 6.5 - 8.1 g/dL - - -  Total Bilirubin 0.3 - 1.2 mg/dL - - -  Alkaline Phos 38 - 126 U/L - - -  AST 15 - 41 U/L - - -  ALT 0 - 44 U/L - - -    CXR: clear  _______________________________________________________________  Assessment and Plan: POD 1 s/p AVR  Neuro: pain controlled CV: will remove swan, and A line once off neo.  Pacer cut to back-up rate Pulm: continue pulm toilet Renal: creat stable GI: advancing diet Heme: stable ID: afebrile Endo: SSI.  Will start home insulin pump Dispo: continue ICU care   948 06/06/2021 11:17 AM

## 2021-06-07 ENCOUNTER — Inpatient Hospital Stay (HOSPITAL_COMMUNITY): Payer: Medicare Other

## 2021-06-07 LAB — CBC
HCT: 31.7 % — ABNORMAL LOW (ref 36.0–46.0)
Hemoglobin: 10.4 g/dL — ABNORMAL LOW (ref 12.0–15.0)
MCH: 28.7 pg (ref 26.0–34.0)
MCHC: 32.8 g/dL (ref 30.0–36.0)
MCV: 87.6 fL (ref 80.0–100.0)
Platelets: 117 10*3/uL — ABNORMAL LOW (ref 150–400)
RBC: 3.62 MIL/uL — ABNORMAL LOW (ref 3.87–5.11)
RDW: 15.4 % (ref 11.5–15.5)
WBC: 19.3 10*3/uL — ABNORMAL HIGH (ref 4.0–10.5)
nRBC: 0 % (ref 0.0–0.2)

## 2021-06-07 LAB — GLUCOSE, CAPILLARY
Glucose-Capillary: 103 mg/dL — ABNORMAL HIGH (ref 70–99)
Glucose-Capillary: 132 mg/dL — ABNORMAL HIGH (ref 70–99)
Glucose-Capillary: 171 mg/dL — ABNORMAL HIGH (ref 70–99)
Glucose-Capillary: 192 mg/dL — ABNORMAL HIGH (ref 70–99)
Glucose-Capillary: 193 mg/dL — ABNORMAL HIGH (ref 70–99)

## 2021-06-07 LAB — BASIC METABOLIC PANEL
Anion gap: 7 (ref 5–15)
BUN: 15 mg/dL (ref 8–23)
CO2: 26 mmol/L (ref 22–32)
Calcium: 8.2 mg/dL — ABNORMAL LOW (ref 8.9–10.3)
Chloride: 104 mmol/L (ref 98–111)
Creatinine, Ser: 0.86 mg/dL (ref 0.44–1.00)
GFR, Estimated: >60 mL/min (ref >60)
Glucose, Bld: 120 mg/dL — ABNORMAL HIGH (ref 70–99)
Potassium: 3.8 mmol/L (ref 3.5–5.1)
Sodium: 137 mmol/L (ref 135–145)

## 2021-06-07 MED ORDER — FUROSEMIDE 10 MG/ML IJ SOLN
40.0000 mg | Freq: Once | INTRAMUSCULAR | Status: AC
  Administered 2021-06-07: 40 mg via INTRAVENOUS
  Filled 2021-06-07: qty 4

## 2021-06-07 MED ORDER — ~~LOC~~ CARDIAC SURGERY, PATIENT & FAMILY EDUCATION: Freq: Once | Status: DC

## 2021-06-07 MED ORDER — SODIUM CHLORIDE 0.9 % IV SOLN: 250.0000 mL | INTRAVENOUS | Status: DC | PRN

## 2021-06-07 MED ORDER — SODIUM CHLORIDE 0.9% FLUSH
3.0000 mL | Freq: Two times a day (BID) | INTRAVENOUS | Status: DC
  Administered 2021-06-07 – 2021-06-08 (×2): 3 mL via INTRAVENOUS

## 2021-06-07 MED ORDER — SODIUM CHLORIDE 0.9% FLUSH: 3.0000 mL | INTRAVENOUS | Status: DC | PRN

## 2021-06-07 MED ORDER — FUROSEMIDE 40 MG PO TABS
40.0000 mg | ORAL_TABLET | Freq: Every day | ORAL | Status: DC
Start: 2021-06-08 — End: 2021-06-10
  Administered 2021-06-08 – 2021-06-09 (×2): 40 mg via ORAL
  Filled 2021-06-07 (×2): qty 1

## 2021-06-07 NOTE — Progress Notes (Signed)
Pt was placed on CPAP 8 cmH20 with 4 lpm O2 in-line.

## 2021-06-07 NOTE — Progress Notes (Signed)
Pt was sitting in chair when RT entered room. Pt said she did want to wear CPAP, but wanted to wear wen she got In bed. RT will return later to place Pt on CPAP.

## 2021-06-07 NOTE — Progress Notes (Signed)
Inpatient Diabetes Program Recommendations  AACE/ADA: New Consensus Statement on Inpatient Glycemic Control   Target Ranges:  Prepandial:   less than 140 mg/dL      Peak postprandial:   less than 180 mg/dL (1-2 hours)      Critically ill patients:  140 - 180 mg/dL   Results for [name], GOULART (MRN [id number]) as of 06/07/2021 08:10  Ref. Range 06/06/2021 13:14 06/06/2021 15:10 06/06/2021 16:14 06/06/2021 17:12 06/06/2021 18:56 06/06/2021 19:43 06/06/2021 23:45 06/07/2021 03:43 06/07/2021 06:47  Glucose-Capillary Latest Ref Range: 70 - 99 mg/dL 623 (H) 762 (H) 831 (H) 171 (H) 144 (H) 122 (H) 139 (H) 103 (H) 132 (H)    Review of Glycemic Control  Diabetes history: DM1 Outpatient Diabetes medications: Insulin Pump Current orders for Inpatient glycemic control: IV insulin, Novolog 0-24 units Q4H  Inpatient Diabetes Program Recommendations:    Insulin: If patient is using her insulin pump for glucose control, please discontinue IV insulin, Novolog 0-24 units Q4H and use Insulin Pump order set to order Insulin Pump AC&HS and 2am.  NOTE: Noted consult for Diabetes Coordinator. Diabetes Coordinator is not on campus over the weekend but available by pager from 8am to 5pm for questions or concerns. Noted patient had aortic valve replacement surgery on 06/05/21 and was ordered IV insulin which was stopped yesterday and per note by Dr. Cliffton Asters on 06/06/21, plan was for patient to restart insulin pump. Per nursing documentation on MAR, patient is using insulin pump currently.   Thanks, Ashley Penner, RN, MSN, CDE Diabetes Coordinator Inpatient Diabetes Program [phone] (Team Pager from 8am to 5pm)

## 2021-06-07 NOTE — Plan of Care (Signed)

## 2021-06-07 NOTE — Progress Notes (Signed)
CBG 132 this morning. Patient has chosen to not give herself any insulin. Will continue to monitor.

## 2021-06-07 NOTE — Progress Notes (Signed)
      [address]       [place] [address]             [phone]                 2 Days Post-Op Procedure(s) (LRB): AORTIC VALVE REPLACEMENT (AVR), USING INSPIRIS RESILIA  AORTIC VALVE (N/A) TRANSESOPHAGEAL ECHOCARDIOGRAM (TEE) (N/A)   Events: No events  _______________________________________________________________ Vitals: BP 135/76 (BP Location: Left Arm)   Pulse 74   Temp 98.6 F (37 C) (Oral)   Resp 12   Ht 5\' 3"  (1.6 m)   Wt 84.1 kg   SpO2 95%   BMI 32.84 kg/m  Filed Weights   06/05/21 0638 06/06/21 0438 06/07/21 0500  Weight: 79.4 kg 85 kg 84.1 kg     - Neuro: alert NAD  - Cardiovascular: sinus in 70s  Drips: PAP: (33-41)/(15-21) 41/21  - Pulm: EWOB  ABG    Component Value Date/Time   PHART 7.306 (L) 06/05/2021 1738   PCO2ART 55.0 (H) 06/05/2021 1738   PO2ART 72 (L) 06/05/2021 1738   HCO3 27.4 06/05/2021 1738   TCO2 29 06/05/2021 1738   ACIDBASEDEF 3.0 (H) 06/05/2021 1637   O2SAT 92.0 06/05/2021 1738    - Abd: soft - Extremity: warm  .Intake/Output      09/24 0701 09/25 0700 09/25 0701 09/26 0700   P.O.  120   I.V. (mL/kg) 937.7 (11.1)    Blood     IV Piggyback     Total Intake(mL/kg) 937.7 (11.1) 120 (1.4)   Urine (mL/kg/hr) 585 (0.3)    Blood     Chest Tube 190 20   Total Output 775 20   Net +162.7 +100           _______________________________________________________________ Labs: CBC Latest Ref Rng & Units 06/07/2021 06/06/2021 06/06/2021  WBC 4.0 - 10.5 K/uL 19.3(H) 22.2(H) 19.9(H)  Hemoglobin 12.0 - 15.0 g/dL 10.4(L) 10.5(L) 11.1(L)  Hematocrit 36.0 - 46.0 % 31.7(L) 33.0(L) 34.2(L)  Platelets 150 - 400 K/uL 117(L) 138(L) 134(L)   CMP Latest Ref Rng & Units 06/07/2021 06/06/2021 06/06/2021  Glucose 70 - 99 mg/dL 06/08/2021) 914(N) 829(F)  BUN 8 - 23 mg/dL 15 13 12   Creatinine 0.44 - 1.00 mg/dL 621(H 0.86  Sodium 135 - 145 mmol/L 137 137 139  Potassium 3.5 - 5.1 mmol/L 3.8 4.4 3.7  Chloride 98 - 111  mmol/L 104 105 107  CO2 22 - 32 mmol/L 26 25 23   Calcium 8.9 - 10.3 mg/dL 8.2(L) 8.1(L) 8.1(L)  Total Protein 6.5 - 8.1 g/dL - - -  Total Bilirubin 0.3 - 1.2 mg/dL - - -  Alkaline Phos 38 - 126 U/L - - -  AST 15 - 41 U/L - - -  ALT 0 - 44 U/L - - -    CXR: clear  _______________________________________________________________  Assessment and Plan: POD 2 s/p AVR  Neuro: pain controlled CV:  Pacer cut to back-up rate Pulm: continue pulm toilet.  Will remove CT Renal: creat stable.  Diuresing today GI: on diet Heme: stable ID: afebrile Endo: SSI. home insulin pump Dispo: floor   5.78 06/07/2021 9:46 AM

## 2021-06-08 ENCOUNTER — Encounter (HOSPITAL_COMMUNITY): Payer: Self-pay | Admitting: Surgery

## 2021-06-08 LAB — CBC
HCT: 32.2 % — ABNORMAL LOW (ref 36.0–46.0)
Hemoglobin: 10.3 g/dL — ABNORMAL LOW (ref 12.0–15.0)
MCH: 28.3 pg (ref 26.0–34.0)
MCHC: 32 g/dL (ref 30.0–36.0)
MCV: 88.5 fL (ref 80.0–100.0)
Platelets: 148 10*3/uL — ABNORMAL LOW (ref 150–400)
RBC: 3.64 MIL/uL — ABNORMAL LOW (ref 3.87–5.11)
RDW: 15 % (ref 11.5–15.5)
WBC: 17.3 10*3/uL — ABNORMAL HIGH (ref 4.0–10.5)
nRBC: 0 % (ref 0.0–0.2)

## 2021-06-08 LAB — GLUCOSE, CAPILLARY
Glucose-Capillary: 199 mg/dL — ABNORMAL HIGH (ref 70–99)
Glucose-Capillary: 203 mg/dL — ABNORMAL HIGH (ref 70–99)
Glucose-Capillary: 190 mg/dL — ABNORMAL HIGH (ref 70–99)
Glucose-Capillary: 186 mg/dL — ABNORMAL HIGH (ref 70–99)
Glucose-Capillary: 149 mg/dL — ABNORMAL HIGH (ref 70–99)
Glucose-Capillary: 145 mg/dL — ABNORMAL HIGH (ref 70–99)
Glucose-Capillary: 130 mg/dL — ABNORMAL HIGH (ref 70–99)
Glucose-Capillary: 119 mg/dL — ABNORMAL HIGH (ref 70–99)
Glucose-Capillary: 218 mg/dL — ABNORMAL HIGH (ref 70–99)
Glucose-Capillary: 157 mg/dL — ABNORMAL HIGH (ref 70–99)

## 2021-06-08 LAB — BASIC METABOLIC PANEL
Anion gap: 10 (ref 5–15)
BUN: 18 mg/dL (ref 8–23)
CO2: 26 mmol/L (ref 22–32)
Calcium: 8.3 mg/dL — ABNORMAL LOW (ref 8.9–10.3)
Chloride: 100 mmol/L (ref 98–111)
Creatinine, Ser: 0.84 mg/dL (ref 0.44–1.00)
GFR, Estimated: >60 mL/min (ref >60)
Glucose, Bld: 231 mg/dL — ABNORMAL HIGH (ref 70–99)
Potassium: 4.3 mmol/L (ref 3.5–5.1)
Sodium: 136 mmol/L (ref 135–145)

## 2021-06-08 LAB — SURGICAL PATHOLOGY

## 2021-06-08 MED ORDER — INSULIN DETEMIR 100 UNIT/ML ~~LOC~~ SOLN
40.0000 [IU] | Freq: Two times a day (BID) | SUBCUTANEOUS | Status: DC
  Administered 2021-06-08: 40 [IU] via SUBCUTANEOUS
  Filled 2021-06-08 (×3): qty 0.4

## 2021-06-08 MED ORDER — INSULIN ASPART 100 UNIT/ML IJ SOLN: 0.0000 [IU] | INTRAMUSCULAR | Status: DC

## 2021-06-08 MED ORDER — INSULIN ASPART 100 UNIT/ML IJ SOLN
4.0000 [IU] | Freq: Three times a day (TID) | INTRAMUSCULAR | Status: DC
  Administered 2021-06-09 – 2021-06-12 (×5): 4 [IU] via SUBCUTANEOUS

## 2021-06-08 MED ORDER — INSULIN PUMP
Freq: Three times a day (TID) | SUBCUTANEOUS | Status: DC
  Filled 2021-06-08: qty 1

## 2021-06-08 MED ORDER — INSULIN DETEMIR 100 UNIT/ML ~~LOC~~ SOLN
30.0000 [IU] | Freq: Once | SUBCUTANEOUS | Status: AC
  Administered 2021-06-08: 30 [IU] via SUBCUTANEOUS
  Filled 2021-06-08: qty 0.3

## 2021-06-08 MED ORDER — INSULIN ASPART 100 UNIT/ML IJ SOLN
0.0000 [IU] | INTRAMUSCULAR | Status: DC
Start: 2021-06-08 — End: 2021-06-12
  Administered 2021-06-08: 2 [IU] via SUBCUTANEOUS
  Administered 2021-06-08: 3 [IU] via SUBCUTANEOUS
  Administered 2021-06-09: 2 [IU] via SUBCUTANEOUS
  Administered 2021-06-09 (×2): 3 [IU] via SUBCUTANEOUS
  Administered 2021-06-10 (×3): 1 [IU] via SUBCUTANEOUS
  Administered 2021-06-10: 2 [IU] via SUBCUTANEOUS
  Administered 2021-06-11: 1 [IU] via SUBCUTANEOUS
  Administered 2021-06-11 – 2021-06-12 (×3): 2 [IU] via SUBCUTANEOUS
  Administered 2021-06-12: 3 [IU] via SUBCUTANEOUS
  Administered 2021-06-12: 2 [IU] via SUBCUTANEOUS
  Administered 2021-06-12: 1 [IU] via SUBCUTANEOUS

## 2021-06-08 MED FILL — Thrombin (Recombinant) For Soln 20000 Unit: CUTANEOUS | Qty: 1 | Status: AC

## 2021-06-08 NOTE — Progress Notes (Signed)
CARDIAC REHAB PHASE I   PRE:  Rate/Rhythm: 70 SR  BP:  Sitting: 120/84     SaO2: 97 6L  MODE:  Ambulation: 370 ft   POST:  Rate/Rhythm: 94 SR  BP:  Sitting: 145/64    SaO2: 95 6L  --> 93 4L   Pt drowsy, but agreeable to walk. Pt ambulated 367ft in hallway assist of one with EVA. Pt states some feelings of unsteadiness, and consistently veered to the write. Verbal cueing needed to not run into the wall. Pt returned to recliner. O2 turned down. Encouraged continued ambulation and IS use.   [id number] Reynold Bowen, RN BSN 06/08/2021 2:00 PM

## 2021-06-08 NOTE — Progress Notes (Addendum)
TCTS DAILY ICU PROGRESS NOTE                   [address]            [place] [address]          [phone]   3 Days Post-Op Procedure(s) (LRB): AORTIC VALVE REPLACEMENT (AVR), USING INSPIRIS RESILIA  AORTIC VALVE (N/A) TRANSESOPHAGEAL ECHOCARDIOGRAM (TEE) (N/A)  Total Length of Stay:  LOS: 3 days   Subjective: Feeling better, stronger slowly  Objective: Vital signs in last 24 hours: Temp:  [97.7 F (36.5 C)-98.6 F (37 C)] 97.7 F (36.5 C) (09/26 0757) Pulse Rate:  [76] 76 (09/25 2100) Cardiac Rhythm: Normal sinus rhythm (09/26 0400) Resp:  [11-25] 19 (09/26 0630) BP: (119-160)/(58-106) 148/78 (09/26 0500) SpO2:  [88 %-96 %] 91 % (09/26 0630) Weight:  [84.5 kg] 84.5 kg (09/26 0630)  Filed Weights   06/06/21 0438 06/07/21 0500 06/08/21 0630  Weight: 85 kg 84.1 kg 84.5 kg    Weight change: 0.4 kg   Hemodynamic parameters for last 24 hours:    Intake/Output from previous day: 09/25 0701 - 09/26 0700 In: 120 [P.O.:120] Out: 1970 [Urine:1800; Chest Tube:170]  Intake/Output this shift: No intake/output data recorded.  Current Meds: Scheduled Meds:  acetaminophen  1,000 mg Oral Q6H   Or   acetaminophen (TYLENOL) oral liquid 160 mg/5 mL  1,000 mg Per Tube Q6H   aspirin EC  325 mg Oral Daily   Or   aspirin  324 mg Per Tube Daily   atorvastatin  40 mg Oral Daily   bisacodyl  10 mg Oral Daily   Or   bisacodyl  10 mg Rectal Daily   chlorhexidine  15 mL Mouth Rinse BID   Chlorhexidine Gluconate Cloth  6 each Topical Daily   [facility], Patient & Family Education   Does not apply Once   docusate sodium  200 mg Oral Daily   enoxaparin (LOVENOX) injection  40 mg Subcutaneous QHS   furosemide  40 mg Oral Daily   influenza vaccine adjuvanted  0.5 mL Intramuscular Tomorrow-1000   insulin pump   Subcutaneous TID WC, HS, 0200   levothyroxine  125 mcg Oral QAC breakfast   loratadine  10 mg Oral Daily   mouth rinse  15 mL Mouth  Rinse q12n4p   melatonin  10 mg Oral QHS   metoprolol tartrate  12.5 mg Oral BID   Or   metoprolol tartrate  12.5 mg Per Tube BID   pantoprazole  40 mg Oral Daily   rOPINIRole  0.5 mg Oral QHS   sertraline  100 mg Oral Daily   sodium chloride flush  10-40 mL Intracatheter Q12H   sodium chloride flush  3 mL Intravenous Q12H   sodium chloride flush  3 mL Intravenous Q12H   Continuous Infusions:  sodium chloride Stopped (06/06/21 1239)   sodium chloride Stopped (06/06/21 0514)   sodium chloride 10 mL/hr at 06/05/21 1255   sodium chloride     lactated ringers     lactated ringers     lactated ringers 20 mL/hr at 06/05/21 1311   PRN Meds:.sodium chloride, sodium chloride, albuterol, ALPRAZolam, dextrose, lactated ringers, metoprolol tartrate, midazolam, morphine injection, ondansetron (ZOFRAN) IV, oxyCODONE, sodium chloride flush, sodium chloride flush, sodium chloride flush, traMADol  General appearance: alert, cooperative, and no distress Heart: regular rate and rhythm Lungs: lildly dim in bases Abdomen: benign Extremities: some edema in hands Wound:  dressing CDI  Lab Results: CBC: Recent Labs    06/07/21 0358 06/08/21 0540  WBC 19.3* 17.3*  HGB 10.4* 10.3*  HCT 31.7* 32.2*  PLT 117* 148*   BMET:  Recent Labs    06/07/21 0358 06/08/21 0540  NA 137 136  K 3.8 4.3  CL 104 100  CO2 26 26  GLUCOSE 120* 231*  BUN 15 18  CREATININE 0.86 0.84  CALCIUM 8.2* 8.3*    CMET: Lab Results  Component Value Date   WBC 17.3 (H) 06/08/2021   HGB 10.3 (L) 06/08/2021   HCT 32.2 (L) 06/08/2021   PLT 148 (L) 06/08/2021   GLUCOSE 231 (H) 06/08/2021   CHOL 147 01/09/2018   TRIG 252 (H) 01/09/2018   HDL 40 (L) 01/09/2018   LDLCALC 74 01/09/2018   ALT 19 06/03/2021   AST 27 06/03/2021   NA 136 06/08/2021   K 4.3 06/08/2021   CL 100 06/08/2021   CREATININE 0.84 06/08/2021   BUN 18 06/08/2021   CO2 26 06/08/2021   TSH 1.82 01/09/2018   INR 1.3 (H) 06/05/2021   HGBA1C 7.1  (H) 06/03/2021   MICROALBUR 19.08 (H) 02/12/2014      PT/INR:  Recent Labs    06/05/21 1214  LABPROT 16.5*  INR 1.3*   Radiology: No results found.   Assessment/Plan: S/P Procedure(s) (LRB): AORTIC VALVE REPLACEMENT (AVR), USING INSPIRIS RESILIA  AORTIC VALVE (N/A) TRANSESOPHAGEAL ECHOCARDIOGRAM (TEE) (N/A)  POD#3 1 afeb, VSS sBP 119-160, sats ok on 6 liters, on CPAP last - 8 cmH20 with 4 liters 2 sinus rhythm with PAC's and PVC's 3 good UOP, weight up about 5 kg- normal renal fxn 4 leukocytosis trend conts to improve 5 BS control variable- transitioning to insulin pump 6 Wires and CT's to be removed today 7 waiting for 4 e bed 8 routine pulm toilet and rehab   Rowe Clack PA-C Pager [phone] 06/08/2021 8:05 AM   Agree with above. Awaiting floor bed. Wires and chest tubes will be removed today.  Chandlar Staebell Keane Scrape

## 2021-06-08 NOTE — Progress Notes (Addendum)
Inpatient Diabetes Program Recommendations  AACE/ADA: New Consensus Statement on Inpatient Glycemic Control   Target Ranges:  Prepandial:   less than 140 mg/dL      Peak postprandial:   less than 180 mg/dL (1-2 hours)      Critically ill patients:  140 - 180 mg/dL  Results for [name], [name] (MRN [id number]) as of 06/08/2021 12:16  Ref. Range 06/07/2021 06:47 06/07/2021 11:26 06/07/2021 15:52 06/07/2021 22:28 06/08/2021 02:58 06/08/2021 06:23 06/08/2021 11:26  Glucose-Capillary Latest Ref Range: 70 - 99 mg/dL 867 (H) 619 (H) 509 (H) 193 (H) 199 (H) 203 (H) 190 (H)   Review of Glycemic Control  Diabetes history: DM Outpatient Diabetes medications: T-Slim insulin pump with Novolog Current orders for Inpatient glycemic control: Insulin Pump AC&HS and 2am  NOTE: Spoke with patient regarding DM control and insulin pump. Upon entering the room, nurse and husband at bedside and patient had insulin pump in her hand and trying to put in carbohydrates. Insulin pump was not on at all. Appeared that it needed to be charged. Patient's husband got the charger and once it was plugged in, the insulin pump came on and indicated a new reservoir was needed. Patient  Patient and nurse report the the pump was on this morning and patient reported that she ate 14 grams of carbs for breakfast and she thinks she gave herself a bolus of insulin for carbs. Patient seems slow to respond and when asked if she was alert enough to use her insulin pump, patient states that she is but she is still feeling the effect of the anesthesia. Patient is not eating much and per nurse the lunch ticket notes a total of 54 grams of carbs on her tray if she eats 100%. Patient put in 30 grams of carbs for lunch and current glucose of 190 mg/dl and her pump delivered 12.29 units. When asked patient to find her pump settings, patient was able to find the basal rate as 3.6 units/hr but not able to pull up the carb ratio or the insulin sensitivity. Will  plan to call Dr. Talmage Nap (Endocrinologist) to obtain pump settings. Patient has all need pump supplies at bedside. Reminded patient that if she eats more than 30 grams of carbs she needs to put them in her pump. Patient has on Dexcom CGM and it is not showing on her T-slim pump (should be showing current glucose from CGM on main screen). Patient is not able to pull up Dexcom CGM reading on her phone (when app opened it says the Dexcom is not set up on this phone). Patient is unsure why CGM is not transmitting to her pump or her app on her cell phone. Patient has all needed pump supplies at bedside. Encouraged patient to eat at least 30 grams of carbs and to notify nursing staff if she felt like her glucose was getting low. Had patient pull up delivery history and the only bolus for 06/08/21 is the bolus of 12.29 units she just gave herself.   Patient and husband verbalized understanding of information and have no questions at this time. Spoke with RN regarding conversation with patient. Question if patient needs to remove pump and use SQ insulin until her mind is more clear.   Received call back from Dr. Talmage Nap and she provided the following for insulin pump settings:  Basal 12A 3.4 units/hr 3A 3.5 units/hr 8P  3.55 units/hr  Insulin to Carb ratio: 1:5 grams Insulin Sensitivity 1:40 mg/dl (1 unit drops  glucose 40 mg/dl) Target Glucose 315 mg/dl Active insulin time: 4 hours  Dr. Talmage Nap recommends patient remove insulin pump and use SQ insulin regimen. She recommended Levemir 40 units BID, Novolog correction and meal coverage. Therefore, recommended Levemir 30 units x1 now, Levemir 40 units BID to start at 10pm today, Novolog 0-9 units Q4H, Novolog 4 units TID with meals. Communicated recommendations with Gershon Crane, PA, Dois Davenport, RN, [place], RN, and M. Bitoni, RPh and given verbal orders from [place], [place] to make the changes.  Addendum 06/08/21@15 :05-Went back by to talk with patient and her husband  regarding transitioning from insulin pump to SQ insulin. Informed patient I spoke with Dr. Talmage Nap and she recommended patient remove insulin pump and use SQ insulin while inpatient and she could resume her pump at discharge. Discussed current SQ insulin orders for Levemir, Novolog correction, and Novolog meal coverage. Explained that she will need to eat at least 50% of meals in order to get the meal coverage.  Patient states she is agreeable with plan and she states she will "be glad when her mind clears".  Also spoke with Dois Davenport, RN. Will continue to follow along while inpatient.  Thanks, Orlando Penner, RN, MSN, CDE Diabetes Coordinator Inpatient Diabetes Program [phone] (Team Pager from 8am to 5pm)

## 2021-06-08 NOTE — Progress Notes (Signed)
      [address]       [place] [address]             [phone]      POD # 3 AVR  BP (!) 130/92 (BP Location: Left Arm)   Pulse 76   Temp 98.2 F (36.8 C) (Oral)   Resp (!) 9   Ht 5\' 3"  (1.6 m)   Wt 84.5 kg   SpO2 96%   BMI 33.00 kg/m   Intake/Output Summary (Last 24 hours) at 06/08/2021 1813 Last data filed at 06/08/2021 1800 Gross per 24 hour  Intake 360 ml  Output 1175 ml  Net -815 ml   No new issues Awaiting tele bed  06/10/2021 C. Viviann Spare, MD [facility] [phone]

## 2021-06-08 NOTE — Addendum Note (Signed)
Encounter addended by: Novella Olive on: 06/08/2021 3:24 PM  Actions taken: Letter saved

## 2021-06-09 ENCOUNTER — Inpatient Hospital Stay: Payer: Self-pay

## 2021-06-09 LAB — GLUCOSE, CAPILLARY
Glucose-Capillary: 100 mg/dL — ABNORMAL HIGH (ref 70–99)
Glucose-Capillary: 101 mg/dL — ABNORMAL HIGH (ref 70–99)
Glucose-Capillary: 202 mg/dL — ABNORMAL HIGH (ref 70–99)
Glucose-Capillary: 247 mg/dL — ABNORMAL HIGH (ref 70–99)
Glucose-Capillary: 197 mg/dL — ABNORMAL HIGH (ref 70–99)

## 2021-06-09 MED ORDER — AMIODARONE LOAD VIA INFUSION
150.0000 mg | Freq: Once | INTRAVENOUS | Status: AC
  Administered 2021-06-09: 150 mg via INTRAVENOUS
  Filled 2021-06-09: qty 83.34

## 2021-06-09 MED ORDER — AMIODARONE HCL IN DEXTROSE 360-4.14 MG/200ML-% IV SOLN
60.0000 mg/h | INTRAVENOUS | Status: AC
  Administered 2021-06-09 (×3): 60 mg/h via INTRAVENOUS
  Filled 2021-06-09: qty 400
  Filled 2021-06-09: qty 200

## 2021-06-09 MED ORDER — SODIUM CHLORIDE 0.9% FLUSH: 10.0000 mL | INTRAVENOUS | Status: DC | PRN

## 2021-06-09 MED ORDER — SODIUM CHLORIDE 0.9% FLUSH
10.0000 mL | Freq: Two times a day (BID) | INTRAVENOUS | Status: DC
  Administered 2021-06-09 – 2021-06-13 (×7): 10 mL

## 2021-06-09 MED ORDER — AMIODARONE HCL IN DEXTROSE 360-4.14 MG/200ML-% IV SOLN
30.0000 mg/h | INTRAVENOUS | Status: DC
  Administered 2021-06-09 (×2): 30 mg/h via INTRAVENOUS
  Filled 2021-06-09: qty 200

## 2021-06-09 MED ORDER — INSULIN DETEMIR 100 UNIT/ML ~~LOC~~ SOLN
35.0000 [IU] | Freq: Two times a day (BID) | SUBCUTANEOUS | Status: DC
  Administered 2021-06-09 – 2021-06-13 (×9): 35 [IU] via SUBCUTANEOUS
  Filled 2021-06-09 (×11): qty 0.35

## 2021-06-09 MED FILL — Electrolyte-R (PH 7.4) Solution: INTRAVENOUS | Qty: 4000 | Status: AC

## 2021-06-09 MED FILL — Lidocaine HCl Local Preservative Free (PF) Inj 1%: INTRAMUSCULAR | Qty: 5 | Status: AC

## 2021-06-09 MED FILL — Mannitol IV Soln 20%: INTRAVENOUS | Qty: 500 | Status: AC

## 2021-06-09 MED FILL — Heparin Sodium (Porcine) Inj 1000 Unit/ML: INTRAMUSCULAR | Qty: 10 | Status: AC

## 2021-06-09 MED FILL — Sodium Bicarbonate IV Soln 8.4%: INTRAVENOUS | Qty: 50 | Status: AC

## 2021-06-09 MED FILL — Sodium Chloride IV Soln 0.9%: INTRAVENOUS | Qty: 2000 | Status: AC

## 2021-06-09 MED FILL — Lidocaine HCl Local Preservative Free (PF) Inj 2%: INTRAMUSCULAR | Qty: 15 | Status: AC

## 2021-06-09 NOTE — Progress Notes (Signed)
TCTS DAILY ICU PROGRESS NOTE                   [address]            [place] [address]          [phone]   4 Days Post-Op Procedure(s) (LRB): AORTIC VALVE REPLACEMENT (AVR), USING INSPIRIS RESILIA  AORTIC VALVE (N/A) TRANSESOPHAGEAL ECHOCARDIOGRAM (TEE) (N/A)  Total Length of Stay:  LOS: 4 days   Subjective: Up in the bedside chair, no new concerns.  Tolerating PO's, No BM yet.   Objective: Vital signs in last 24 hours: Temp:  [97.7 F (36.5 C)-98.9 F (37.2 C)] 98.9 F (37.2 C) (09/27 0748) Pulse Rate:  [85] 85 (09/27 0000) Cardiac Rhythm: Normal sinus rhythm (09/27 0800) Resp:  [8-22] 18 (09/27 1000) BP: (107-168)/(56-139) 117/71 (09/27 1000) SpO2:  [91 %-97 %] 96 % (09/27 1000) Weight:  [84.5 kg] 84.5 kg (09/27 0600)  Filed Weights   06/07/21 0500 06/08/21 0630 06/09/21 0600  Weight: 84.1 kg 84.5 kg 84.5 kg    Weight change: 0 kg   Hemodynamic parameters for last 24 hours:    Intake/Output from previous day: 09/26 0701 - 09/27 0700 In: 840 [P.O.:840] Out: 1125 [Urine:1125]  Intake/Output this shift: Total I/O In: 240 [P.O.:240] Out: -   Current Meds: Scheduled Meds:  acetaminophen  1,000 mg Oral Q6H   Or   acetaminophen (TYLENOL) oral liquid 160 mg/5 mL  1,000 mg Per Tube Q6H   amiodarone  150 mg Intravenous Once   aspirin EC  325 mg Oral Daily   Or   aspirin  324 mg Per Tube Daily   atorvastatin  40 mg Oral Daily   bisacodyl  10 mg Oral Daily   Or   bisacodyl  10 mg Rectal Daily   chlorhexidine  15 mL Mouth Rinse BID   Chlorhexidine Gluconate Cloth  6 each Topical Daily   [place], Patient & Family Education   Does not apply Once   docusate sodium  200 mg Oral Daily   enoxaparin (LOVENOX) injection  40 mg Subcutaneous QHS   furosemide  40 mg Oral Daily   influenza vaccine adjuvanted  0.5 mL Intramuscular Tomorrow-1000   insulin aspart  0-9 Units Subcutaneous Q4H   insulin aspart  4 Units  Subcutaneous TID WC   insulin detemir  35 Units Subcutaneous BID   levothyroxine  125 mcg Oral QAC breakfast   loratadine  10 mg Oral Daily   mouth rinse  15 mL Mouth Rinse q12n4p   melatonin  10 mg Oral QHS   metoprolol tartrate  12.5 mg Oral BID   Or   metoprolol tartrate  12.5 mg Per Tube BID   pantoprazole  40 mg Oral Daily   rOPINIRole  0.5 mg Oral QHS   sertraline  100 mg Oral Daily   sodium chloride flush  3 mL Intravenous Q12H   Continuous Infusions:  sodium chloride Stopped (06/06/21 1239)   sodium chloride Stopped (06/06/21 0514)   sodium chloride 10 mL/hr at 06/05/21 1255   amiodarone     Followed by   amiodarone     lactated ringers     lactated ringers     lactated ringers 20 mL/hr at 06/05/21 1311   PRN Meds:.sodium chloride, albuterol, ALPRAZolam, dextrose, lactated ringers, metoprolol tartrate, midazolam, morphine injection, ondansetron (ZOFRAN) IV, oxyCODONE, sodium chloride flush, traMADol  General appearance: alert, cooperative, and no distress  Heart: frequent PAF this am with HR alternating between 60's and 150's - 180.  Lungs: breath sounds are clear, O32 at 4-5L.min.  Abdomen: benign Extremities: mild edema in hands and LE's Wound: the sternal incision is intact and dry.   Lab Results: CBC: Recent Labs    06/07/21 0358 06/08/21 0540  WBC 19.3* 17.3*  HGB 10.4* 10.3*  HCT 31.7* 32.2*  PLT 117* 148*    BMET:  Recent Labs    06/07/21 0358 06/08/21 0540  NA 137 136  K 3.8 4.3  CL 104 100  CO2 26 26  GLUCOSE 120* 231*  BUN 15 18  CREATININE 0.86 0.84  CALCIUM 8.2* 8.3*     CMET: Lab Results  Component Value Date   WBC 17.3 (H) 06/08/2021   HGB 10.3 (L) 06/08/2021   HCT 32.2 (L) 06/08/2021   PLT 148 (L) 06/08/2021   GLUCOSE 231 (H) 06/08/2021   CHOL 147 01/09/2018   TRIG 252 (H) 01/09/2018   HDL 40 (L) 01/09/2018   LDLCALC 74 01/09/2018   ALT 19 06/03/2021   AST 27 06/03/2021   NA 136 06/08/2021   K 4.3 06/08/2021   CL 100  06/08/2021   CREATININE 0.84 06/08/2021   BUN 18 06/08/2021   CO2 26 06/08/2021   TSH 1.82 01/09/2018   INR 1.3 (H) 06/05/2021   HGBA1C 7.1 (H) 06/03/2021   MICROALBUR 19.08 (H) 02/12/2014      PT/INR:  No results for input(s): LABPROT, INR in the last 72 hours.  Radiology: No results found.   Assessment/Plan: S/P Procedure(s) (LRB): AORTIC VALVE REPLACEMENT (AVR), USING INSPIRIS RESILIA  AORTIC VALVE (N/A) TRANSESOPHAGEAL ECHOCARDIOGRAM (TEE) (N/A)    CV-postop day 4 bioprosthetic aortic valve replacement for severe aortic stenosis and moderate aortic insufficiency.  Stable hemodynamically.  Blood pressure has been variable because she has developed paroxysmal atrial fibrillation this morning.  We will start IV amiodarone loading.  She has only a peripheral IV at this point in a relatively small vein.  We will get the infusion started here by also will request a PICC line today.  PULM-strong respiratory effort, maintaining sats on 4 to 5 L O2 by nasal cannula  RENAL-normal function.  Weight remains about 5 kg above preop.  Continue diuresis with oral Lasix  GI-tolerating diet no BM yet but is not uncomfortable.  Abdomen is benign.  We will hold off on aggressive laxatives for another day.  Endo-history of type 2 diabetes mellitus.  She has been managing this with an insulin pump at home but has had some difficulty getting it set up.  In the hospital.  Advice of the diabetes coordinator is noted to bandage the use of the insulin pump while inpatient and continue utilizing the sliding scale, Levemir twice daily, and NovoLog mealtime coverage.  She also has a history of hypothyroidism and her levothyroxine has been resumed.  HEME-mild expected acute blood loss anemia.  Hematocrit is stable.  Monitor.  On daily enoxaparin for DVT prophylaxis  History of depression--sertraline has been resumed  Disposition-ready for transition to 4 E. progressive care and planning eventual  discharge to home.  Needs more work on mobility.   Leary Roca PA-C Pager [phone] 06/09/2021 10:41 AM

## 2021-06-09 NOTE — Progress Notes (Signed)
CARDIAC REHAB PHASE I   Went to offer to walk with pt. Pt in and out of Afib this morning. RN requesting to hold walk until amio bolus given. Pt more awake today, states she feels better. Will continue f/u to offer ambulation as able.  Reynold Bowen, RN BSN 06/09/2021 10:37 AM

## 2021-06-09 NOTE — Progress Notes (Signed)
Peripherally Inserted Central Catheter Placement  The IV Nurse has discussed with the patient and/or persons authorized to consent for the patient, the purpose of this procedure and the potential benefits and risks involved with this procedure.  The benefits include less needle sticks, lab draws from the catheter, and the patient may be discharged home with the catheter. Risks include, but not limited to, infection, bleeding, blood clot (thrombus formation), and puncture of an artery; nerve damage and irregular heartbeat and possibility to perform a PICC exchange if needed/ordered by physician.  Alternatives to this procedure were also discussed.  Bard Power PICC patient education guide, fact sheet on infection prevention and patient information card has been provided to patient /or left at bedside.    PICC Placement Documentation  PICC Double Lumen 06/09/21 PICC Right Cephalic 36 cm 0 cm (Active)  Indication for Insertion or Continuance of Line Chronic illness with exacerbations (CF, Sickle Cell, etc.);Administration of hyperosmolar/irritating solutions (i.e. TPN, Vancomycin, etc.) 06/09/21 1614  Exposed Catheter (cm) 0 cm 06/09/21 1614  Site Assessment Clean;Dry;Intact 06/09/21 1614  Lumen #1 Status Flushed;Saline locked;Blood return noted 06/09/21 1614  Lumen #2 Status Flushed;Saline locked;Blood return noted 06/09/21 1614  Dressing Type Transparent 06/09/21 1614  Dressing Status Clean;Dry;Intact 06/09/21 1614  Antimicrobial disc in place? Yes 06/09/21 1614  Safety Lock Not Applicable 06/09/21 1614  Line Care Connections checked and tightened 06/09/21 1614  Line Adjustment (NICU/IV Team Only) No 06/09/21 1614  Dressing Intervention New dressing 06/09/21 1614  Dressing Change Due 06/16/21 06/09/21 1614       Jesly Hartmann, Lajean Manes 06/09/2021, 4:15 PM

## 2021-06-09 NOTE — Progress Notes (Signed)
CARDIAC REHAB PHASE I   PRE:  Rate/Rhythm: 61 SR  BP:  Sitting: 138/72      SaO2: 96 3L  MODE:  Ambulation: 740 ft   POST:  Rate/Rhythm: 102 ST  BP:  Sitting: 139/93    SaO2: 95 3L  --> 93 2L   Pt ambulated 716ft in hallway standby assist with EVA. Pt much steadier on her feet today, able to control walker, and motivated to increase distance. Pt then helped to BR and returned to recliner. Encouraged continued IS use and walks. Call bell and bedside table within reach. Will continue to follow.  [id number] Reynold Bowen, RN BSN 06/09/2021 1:32 PM

## 2021-06-09 NOTE — Progress Notes (Signed)
Patient has order for PICC, Amiodarone running peripherally, IV team paged, RN told by Lynnea Ferrier from IV team to continue running Amiodarone peripherally until PICC can be placed.

## 2021-06-09 NOTE — Progress Notes (Signed)
Pt received from 2H. VSS. Pt oriented to room and unit. Call light in reach.  Versie Starks, RN

## 2021-06-10 ENCOUNTER — Inpatient Hospital Stay (HOSPITAL_COMMUNITY): Payer: Medicare Other

## 2021-06-10 LAB — BASIC METABOLIC PANEL
Anion gap: 5 (ref 5–15)
BUN: 17 mg/dL (ref 8–23)
CO2: 29 mmol/L (ref 22–32)
Calcium: 8.1 mg/dL — ABNORMAL LOW (ref 8.9–10.3)
Chloride: 102 mmol/L (ref 98–111)
Creatinine, Ser: 0.72 mg/dL (ref 0.44–1.00)
GFR, Estimated: >60 mL/min (ref >60)
Glucose, Bld: 116 mg/dL — ABNORMAL HIGH (ref 70–99)
Potassium: 3.1 mmol/L — ABNORMAL LOW (ref 3.5–5.1)
Sodium: 136 mmol/L (ref 135–145)

## 2021-06-10 LAB — GLUCOSE, CAPILLARY
Glucose-Capillary: 137 mg/dL — ABNORMAL HIGH (ref 70–99)
Glucose-Capillary: 123 mg/dL — ABNORMAL HIGH (ref 70–99)
Glucose-Capillary: 135 mg/dL — ABNORMAL HIGH (ref 70–99)
Glucose-Capillary: 113 mg/dL — ABNORMAL HIGH (ref 70–99)
Glucose-Capillary: 143 mg/dL — ABNORMAL HIGH (ref 70–99)
Glucose-Capillary: 198 mg/dL — ABNORMAL HIGH (ref 70–99)

## 2021-06-10 MED ORDER — AMIODARONE HCL 200 MG PO TABS
200.0000 mg | ORAL_TABLET | Freq: Two times a day (BID) | ORAL | Status: DC
  Administered 2021-06-10 – 2021-06-13 (×7): 200 mg via ORAL
  Filled 2021-06-10 (×7): qty 1

## 2021-06-10 MED ORDER — TORSEMIDE 20 MG PO TABS
20.0000 mg | ORAL_TABLET | Freq: Every day | ORAL | Status: DC
  Administered 2021-06-10 – 2021-06-13 (×4): 20 mg via ORAL
  Filled 2021-06-10 (×4): qty 1

## 2021-06-10 MED ORDER — FUROSEMIDE 40 MG PO TABS: 40.0000 mg | ORAL_TABLET | Freq: Every day | ORAL | Status: DC

## 2021-06-10 MED ORDER — VITAMIN D 50 MCG (2000 UT) PO CAPS: 1000.0000 [IU] | ORAL_CAPSULE | Freq: Every day | ORAL | Status: AC

## 2021-06-10 MED ORDER — ASPIRIN 325 MG PO TBEC
325.0000 mg | DELAYED_RELEASE_TABLET | Freq: Every day | ORAL | 0 refills | Status: DC
Start: 2021-06-10 — End: 2021-06-12

## 2021-06-10 MED ORDER — TRAMADOL HCL 50 MG PO TABS
50.0000 mg | ORAL_TABLET | ORAL | Status: DC | PRN
  Administered 2021-06-12 – 2021-06-13 (×2): 50 mg via ORAL
  Filled 2021-06-10 (×2): qty 1

## 2021-06-10 MED ORDER — LACTULOSE 10 GM/15ML PO SOLN
20.0000 g | Freq: Once | ORAL | Status: AC
  Administered 2021-06-10: 20 g via ORAL
  Filled 2021-06-10: qty 30

## 2021-06-10 MED ORDER — POTASSIUM CHLORIDE CRYS ER 20 MEQ PO TBCR
40.0000 meq | EXTENDED_RELEASE_TABLET | Freq: Three times a day (TID) | ORAL | Status: AC
  Administered 2021-06-10 (×3): 40 meq via ORAL
  Filled 2021-06-10 (×3): qty 2

## 2021-06-10 NOTE — Progress Notes (Signed)
CARDIAC REHAB PHASE I   PRE:  Rate/Rhythm: 64 SR?  BP:  Sitting: 138/55      SaO2: 94 RA  MODE:  Ambulation: 940 ft   POST:  Rate/Rhythm: 89 SR?  BP:  Sitting: 169/47    SaO2: 95 RA   Pt ambulated 965ft in hallway standby assist with front wheel walker. Pt denies CP or dizziness throughout walk, some SOB noted, coached through purse-lipped breathing. Pt returned to bed. Encouraged continued ambulation and IS use. Pt requesting walker for home use, CM made aware. Will continue to follow.  [id number] Reynold Bowen, RN BSN 06/10/2021 3:18 PM

## 2021-06-10 NOTE — Progress Notes (Signed)
CARDIAC REHAB PHASE I   Offered to walk with pt. Pt states she is in too much pain to walk. Encouraged OOB, pt declines stating she is too exhausted. Stressed importance of sitting in recliner and ambulation today, especially if she is hopeful for d/c tomorrow. Will f/u and continue to encourage ambulation as able.  [id number] Ashley Bowen, RN BSN 06/10/2021 10:12 AM

## 2021-06-10 NOTE — Progress Notes (Addendum)
      [address]       [place] [address]             [phone]        5 Days Post-Op Procedure(s) (LRB): AORTIC VALVE REPLACEMENT (AVR), USING INSPIRIS RESILIA  AORTIC VALVE (N/A) TRANSESOPHAGEAL ECHOCARDIOGRAM (TEE) (N/A)  Subjective: Patient fatigued but did walk 3 times yesterday. She has not had a bowel movement yet.  Objective: Vital signs in last 24 hours: Temp:  [97.7 F (36.5 C)-98.9 F (37.2 C)] 98.3 F (36.8 C) (09/28 0617) Pulse Rate:  [59-67] 63 (09/28 0617) Cardiac Rhythm: Sinus bradycardia;Heart block (09/27 1900) Resp:  [8-20] 14 (09/28 0617) BP: (107-145)/(57-107) 128/64 (09/28 0617) SpO2:  [90 %-97 %] 90 % (09/28 0617) Weight:  [85 kg] 85 kg (09/28 0617)  Pre op weight 79.4 kg Current Weight  06/10/21 85 kg       Intake/Output from previous day: 09/27 0701 - 09/28 0700 In: 2517.6 [P.O.:480; I.V.:2037.6] Out: -    Physical Exam:  Cardiovascular: RRR, no murmur Pulmonary: Clear to auscultation on the right and slightly diminished left base Abdomen: Soft, non tender, bowel sounds present. Extremities: Mild bilateral lower extremity edema. Wound: Clean and dry.  No erythema or signs of infection.  Lab Results: CBC: Recent Labs    06/08/21 0540  WBC 17.3*  HGB 10.3*  HCT 32.2*  PLT 148*   BMET:  Recent Labs    06/08/21 0540 06/10/21 0447  NA 136 136  K 4.3 3.1*  CL 100 102  CO2 26 29  GLUCOSE 231* 116*  BUN 18 17  CREATININE 0.84 0.72  CALCIUM 8.3* 8.1*    PT/INR:  Lab Results  Component Value Date   INR 1.3 (H) 06/05/2021   INR 1.0 06/05/2021   INR 1.0 06/03/2021   ABG:  INR: Will add last result for INR, ABG once components are confirmed Will add last 4 CBG results once components are confirmed  Assessment/Plan:  1. CV - Previous a fib. Had some a fib yesterday and given Amiodarone bolus. SR, PVCs, first degree heart block with HR in the 60's. On Amiodarone drip and  Lopressor 12.5 mg  bid. Transition to oral Amiodarone 200 mg bid. 2.  Pulmonary - On room air. Check PA/LAT CXR this am. Encourage incentive spirometer 3. Volume Overload - On Lasix 40 mg daily but will give IV this am 4.  Expected post op acute blood loss anemia - Last H and H stable at 10.3 and 32.2 5. DM-CBGS 202/247/197. On Insulin. Of note, patient with Insulin pump but because has been groggy at night, turned off. Will resume at discharge. Pre op HGA1C 7.1 6. Supplement potassium 7. LOC constipation 8. History of hypothyroidism-Levothyroxine 125 mcg daily 8. Hope to discharge in am if maintains SR  Lelon Huh [facility] 06/10/2021,7:11 AM    Chart reviewed, patient examined, agree with above. Wt is still 12 lbs over preop and she has not diuresed on Lasix 40 so will switch to Demedex. Replace K+. Repeat BMET in am.

## 2021-06-10 NOTE — Progress Notes (Signed)
Mobility Specialist Progress Note    06/10/21 1218  Mobility  Activity Ambulated in hall  Level of Assistance Modified independent, requires aide device or extra time  Assistive Device Front wheel walker  Distance Ambulated (ft) 490 ft  Mobility Ambulated with assistance in hallway  Mobility Response Tolerated well  Mobility performed by Mobility specialist  Bed Position Chair  $Mobility charge 1 Mobility   Pre-Mobility: 65 HR, 116/58 BP, 99% SpO2 During Mobility: 80 HR, 91% SpO2 Post-Mobility: 69 HR, 140/58 BP, 93% SpO2  Pt found in bed and agreeable to walk. Pt agreed to try walk on RA. She had no complaints and demonstrated controlled breathing. Returned to chair and didn't want to put [place] back on. Husband and NT present in room.   [place] Mobility Specialist  Mobility Specialist Phone: [phone]

## 2021-06-10 NOTE — Care Management Important Message (Signed)
Important Message  Patient Details  Name: [name] MRN: [id number] Date of Birth: [birth year]   Medicare Important Message Given:  Yes     Renie Ora 06/10/2021, 10:27 AM

## 2021-06-10 NOTE — TOC Initial Note (Signed)
Transition of Care [facility]) - Initial/Assessment Note    Patient Details  Name: SHAWNTAVIA SAUNDERS MRN: [id number] Date of Birth: 05-01-1951  Transition of Care [facility]) CM/SW Contact:    Harriet Masson, RN Phone Number: 06/10/2021, 3:21 PM  Clinical Narrative:         notified by cardiac rehab patient needs rolling walker for home. [employer] contacted for DME need. Rolling walker to be delivered to room prior to discharge. Patient has pre op referral for home health needs with [facility]. They will follow up post discharge for start of care.             Barriers to Discharge: No Barriers Identified   Patient Goals and CMS Choice Patient states their goals for this hospitalization and ongoing recovery are:: return home   Choice offered to / list presented to : NA  Expected Discharge Plan and Services      Home with home health                     DME Arranged: Walker rolling DME Agency: [employer] Date DME Agency Contacted: 06/10/21 Time DME Agency Contacted: 1520 Representative spoke with at DME Agency: Ezequiel Essex HH Arranged: Disease Management HH Agency: [facility] Date [facility] Agency Contacted: 06/11/21   Representative spoke with at [facility] Agency: Misty Stanley  Prior Living Arrangements/Services                       Activities of Daily Living Home Assistive Devices/Equipment: CBG Meter, Blood pressure cuff, CPAP, Insulin Pump ADL Screening (condition at time of admission) Patient's cognitive ability adequate to safely complete daily activities?: Yes Is the patient deaf or have difficulty hearing?: No Does the patient have difficulty seeing, even when wearing glasses/contacts?: No Does the patient have difficulty concentrating, remembering, or making decisions?: No Patient able to express need for assistance with ADLs?: Yes Does the patient have difficulty dressing or bathing?: No Independently performs ADLs?: Yes (appropriate for developmental age) Does the patient have  difficulty walking or climbing stairs?: No Weakness of Legs: None Weakness of Arms/Hands: None  Permission Sought/Granted                  Emotional Assessment              Admission diagnosis:  S/P AVR [Z95.2] Patient Active Problem List   Diagnosis Date Noted   S/P AVR 06/05/2021   Dyspnea on exertion    Aortic stenosis 04/27/2021   SBO (small bowel obstruction) (HCC) 02/09/2018   Allergic rhinitis 01/09/2018   Diabetic neuropathy (HCC) 06/30/2017   Vitamin D deficiency 06/30/2017   Aortic regurgitation 06/30/2017   Mitral valve prolapse 06/30/2017   Anxiety and depression 06/30/2017   History of nocturia 01/17/2015   OSA on CPAP 01/17/2015   Asthma, chronic 09/25/2014   Nocturia more than twice per night 09/25/2014   Diabetic neuropathy, type II diabetes mellitus (HCC) 09/25/2014   Primary snoring 09/25/2014   Myalgia 09/25/2014   Diabetes mellitus (HCC)    Goiter    Depression    Asthma    SVT (supraventricular tachycardia) (HCC)    Vasculitis (HCC)    GERD (gastroesophageal reflux disease)    FIBROMYALGIA 07/14/2009   PROTEINURIA, MILD 07/14/2009   DIABETIC PERIPHERAL NEUROPATHY 12/04/2007   Hyperlipidemia 12/04/2007   SUPRAVENTRICULAR TACHYCARDIA, HX OF 12/04/2007   DIVERTICULOSIS, COLON 07/12/2007   Hypothyroidism 04/06/2007   DIABETES MELLITUS, TYPE I 04/06/2007   DEPRESSION  04/06/2007   PCP:  Irena Reichmann, DO Pharmacy:   [facility] - [place], [place] - [address] [place] [address] Phone: [phone] Fax: [phone]  [id number] [place], [place] - [address] [place] [address] Phone: [phone] Fax: [phone]     Social Determinants of Health (SDOH) Interventions    Readmission Risk Interventions No flowsheet data found.

## 2021-06-11 ENCOUNTER — Inpatient Hospital Stay (HOSPITAL_COMMUNITY): Payer: Medicare Other

## 2021-06-11 ENCOUNTER — Ambulatory Visit: Payer: Medicare Other | Admitting: Cardiology

## 2021-06-11 DIAGNOSIS — M7989 Other specified soft tissue disorders: Secondary | ICD-10-CM

## 2021-06-11 DIAGNOSIS — M79601 Pain in right arm: Secondary | ICD-10-CM

## 2021-06-11 LAB — BASIC METABOLIC PANEL
Anion gap: 7 (ref 5–15)
BUN: 8 mg/dL (ref 8–23)
CO2: 21 mmol/L — ABNORMAL LOW (ref 22–32)
Calcium: 7.1 mg/dL — ABNORMAL LOW (ref 8.9–10.3)
Chloride: 113 mmol/L — ABNORMAL HIGH (ref 98–111)
Creatinine, Ser: 0.6 mg/dL (ref 0.44–1.00)
GFR, Estimated: >60 mL/min (ref >60)
Glucose, Bld: 100 mg/dL — ABNORMAL HIGH (ref 70–99)
Potassium: 3.2 mmol/L — ABNORMAL LOW (ref 3.5–5.1)
Sodium: 141 mmol/L (ref 135–145)

## 2021-06-11 LAB — GLUCOSE, CAPILLARY
Glucose-Capillary: 113 mg/dL — ABNORMAL HIGH (ref 70–99)
Glucose-Capillary: 116 mg/dL — ABNORMAL HIGH (ref 70–99)
Glucose-Capillary: 127 mg/dL — ABNORMAL HIGH (ref 70–99)
Glucose-Capillary: 138 mg/dL — ABNORMAL HIGH (ref 70–99)
Glucose-Capillary: 149 mg/dL — ABNORMAL HIGH (ref 70–99)
Glucose-Capillary: 162 mg/dL — ABNORMAL HIGH (ref 70–99)
Glucose-Capillary: 169 mg/dL — ABNORMAL HIGH (ref 70–99)

## 2021-06-11 MED ORDER — ALPRAZOLAM 0.5 MG PO TABS
0.5000 mg | ORAL_TABLET | Freq: Two times a day (BID) | ORAL | Status: DC | PRN
  Administered 2021-06-11 – 2021-06-12 (×3): 0.5 mg via ORAL
  Filled 2021-06-11 (×3): qty 1

## 2021-06-11 MED ORDER — DILTIAZEM HCL 60 MG PO TABS
60.0000 mg | ORAL_TABLET | Freq: Four times a day (QID) | ORAL | Status: DC
  Administered 2021-06-11 – 2021-06-12 (×3): 60 mg via ORAL
  Filled 2021-06-11 (×3): qty 1

## 2021-06-11 MED ORDER — METOPROLOL TARTRATE 25 MG PO TABS
25.0000 mg | ORAL_TABLET | Freq: Two times a day (BID) | ORAL | Status: DC
  Administered 2021-06-11: 25 mg via ORAL
  Filled 2021-06-11: qty 1

## 2021-06-11 MED ORDER — POTASSIUM CHLORIDE CRYS ER 20 MEQ PO TBCR
20.0000 meq | EXTENDED_RELEASE_TABLET | Freq: Two times a day (BID) | ORAL | Status: DC
  Administered 2021-06-11: 20 meq via ORAL
  Filled 2021-06-11: qty 1

## 2021-06-11 MED ORDER — POTASSIUM CHLORIDE CRYS ER 20 MEQ PO TBCR
40.0000 meq | EXTENDED_RELEASE_TABLET | Freq: Three times a day (TID) | ORAL | Status: AC
  Administered 2021-06-11 – 2021-06-12 (×3): 40 meq via ORAL
  Filled 2021-06-11 (×3): qty 2

## 2021-06-11 NOTE — Progress Notes (Signed)
CARDIAC REHAB PHASE I   PRE:  Rate/Rhythm: 69 SR with PACs and PVCs  BP:  Sitting: 132/62      SaO2: 94 RA  MODE:  Ambulation: 430 ft   POST:  Rate/Rhythm: 106 ST with PACs and PVCs  BP:  Sitting: 178/60 --> 130/71    SaO2: 97 RA   Pt agreeable to ambulate. Pt ambulated 439ft in hallway, when deciding on going for another lap, pt became SOB and decided to go back into room. Pt sat in recliner, HR ST with PACs and PVCs. Pt coached through purse lipped breathing, sats ok on RA. RN made aware. When writing note, pt noted to go into Afib 130-140s for several minutes before alternating between the two rhythms. RN and CN made aware. Encouraged pt to listen to her symptoms. Will continue to follow.  [id number] Reynold Bowen, RN BSN 06/11/2021 11:05 AM

## 2021-06-11 NOTE — Progress Notes (Signed)
6 Days Post-Op Procedure(s) (LRB): AORTIC VALVE REPLACEMENT (AVR), USING INSPIRIS RESILIA  AORTIC VALVE (N/A) TRANSESOPHAGEAL ECHOCARDIOGRAM (TEE) (N/A) Subjective: Complains of right shoulder pain this am. Ambulated well yesterday. Bowels working.  Diuresed yesterday with wt down 1.5 lbs.  Objective: Vital signs in last 24 hours: Temp:  [98.1 F (36.7 C)-99.3 F (37.4 C)] 98.4 F (36.9 C) (09/29 0031) Pulse Rate:  [62-72] 62 (09/29 0031) Cardiac Rhythm: Normal sinus rhythm;Heart block (09/28 2150) Resp:  [15-19] 19 (09/28 1630) BP: (116-140)/(57-70) 128/58 (09/29 0031) SpO2:  [92 %-98 %] 96 % (09/29 0031) Weight:  [82.6 kg-84.3 kg] (P) 82.6 kg (09/29 0516)  Hemodynamic parameters for last 24 hours:    Intake/Output from previous day: 09/28 0701 - 09/29 0700 In: 838.3 [P.O.:480; I.V.:358.3] Out: -  Intake/Output this shift: No intake/output data recorded.  General appearance: alert and cooperative Neurologic: intact Heart: regular rate and rhythm with frequent extra beats,  S1, S2 normal, no murmur Lungs: clear to auscultation bilaterally Extremities: edema mild Wound: incision healing well.  Lab Results: No results for input(s): WBC, HGB, HCT, PLT in the last 72 hours. BMET:  Recent Labs    06/10/21 0447  NA 136  K 3.1*  CL 102  CO2 29  GLUCOSE 116*  BUN 17  CREATININE 0.72  CALCIUM 8.1*    PT/INR: No results for input(s): LABPROT, INR in the last 72 hours. ABG    Component Value Date/Time   PHART 7.306 (L) 06/05/2021 1738   HCO3 27.4 06/05/2021 1738   TCO2 29 06/05/2021 1738   ACIDBASEDEF 3.0 (H) 06/05/2021 1637   O2SAT 92.0 06/05/2021 1738   CBG (last 3)  Recent Labs    06/10/21 2120 06/11/21 0030 06/11/21 0454  GLUCAP 198* 149* 138*    Assessment/Plan: S/P Procedure(s) (LRB): AORTIC VALVE REPLACEMENT (AVR), USING INSPIRIS RESILIA  AORTIC VALVE (N/A) TRANSESOPHAGEAL ECHOCARDIOGRAM (TEE) (N/A)  POD 6. Hemodynamically stable.  SBP 130 so will increase Lopressor to 25 bid.  Postop atrial fib with RVR converted on amio. Sinus rhythm yesterday but had frequent premature SV beats and brief SVT. Increase in Lopressor should help. Continue amiodarone. Check ECG for intervals today.  Volume excess: wt down slightly from yesterday. Wt is still 10 lbs over preop. Continue Demedex 20 mg daily and replace K+. BMET pending this am.  DM: glucose under adequate control on current meds. Plan to resume insulin pump at home.  Continue IS, ambulation. Anticipate home tomorrow if rhythm remains stable.     LOS: 6 days    Alleen Borne 06/11/2021

## 2021-06-11 NOTE — Progress Notes (Signed)
Pt placed on cpap 

## 2021-06-11 NOTE — Progress Notes (Signed)
At the end of the bath patient went into a tachy rhythm HR in the 190s. IV metoprolol given, patient transferred from chair to bed once HR once HR was more stable (120s). At the time of evaluated HR patient states she had 8/10 chest pain. On-call PA for cardiology paged and made aware.

## 2021-06-11 NOTE — Progress Notes (Signed)
Upper extremity venous has been completed.   Preliminary results in CV Proc.   [name] 06/11/2021 2:58 PM

## 2021-06-11 NOTE — Progress Notes (Signed)
Nurse stated patient with pain and increased swelling right arm (has PICC line). Will check duplex for DVT. Also, Lopressor just increased this am to 25 mg bid. If HR continues to increase, may need to increase again in am. Monitor

## 2021-06-12 ENCOUNTER — Other Ambulatory Visit: Payer: Self-pay | Admitting: Cardiology

## 2021-06-12 ENCOUNTER — Other Ambulatory Visit (HOSPITAL_COMMUNITY): Payer: Self-pay

## 2021-06-12 DIAGNOSIS — I359 Nonrheumatic aortic valve disorder, unspecified: Secondary | ICD-10-CM

## 2021-06-12 DIAGNOSIS — Z954 Presence of other heart-valve replacement: Secondary | ICD-10-CM

## 2021-06-12 LAB — BASIC METABOLIC PANEL
Anion gap: 9 (ref 5–15)
BUN: 11 mg/dL (ref 8–23)
CO2: 24 mmol/L (ref 22–32)
Calcium: 8.8 mg/dL — ABNORMAL LOW (ref 8.9–10.3)
Chloride: 104 mmol/L (ref 98–111)
Creatinine, Ser: 0.77 mg/dL (ref 0.44–1.00)
GFR, Estimated: >60 mL/min (ref >60)
Glucose, Bld: 133 mg/dL — ABNORMAL HIGH (ref 70–99)
Potassium: 4.3 mmol/L (ref 3.5–5.1)
Sodium: 137 mmol/L (ref 135–145)

## 2021-06-12 LAB — GLUCOSE, CAPILLARY
Glucose-Capillary: 125 mg/dL — ABNORMAL HIGH (ref 70–99)
Glucose-Capillary: 163 mg/dL — ABNORMAL HIGH (ref 70–99)
Glucose-Capillary: 228 mg/dL — ABNORMAL HIGH (ref 70–99)
Glucose-Capillary: 169 mg/dL — ABNORMAL HIGH (ref 70–99)
Glucose-Capillary: 195 mg/dL — ABNORMAL HIGH (ref 70–99)

## 2021-06-12 MED ORDER — DILTIAZEM HCL ER COATED BEADS 120 MG PO CP24
240.0000 mg | ORAL_CAPSULE | Freq: Every day | ORAL | Status: DC
  Administered 2021-06-12 – 2021-06-13 (×2): 240 mg via ORAL
  Filled 2021-06-12 (×2): qty 2

## 2021-06-12 MED ORDER — ASPIRIN EC 81 MG PO TBEC
81.0000 mg | DELAYED_RELEASE_TABLET | Freq: Every day | ORAL | Status: DC
  Administered 2021-06-12 – 2021-06-13 (×2): 81 mg via ORAL
  Filled 2021-06-12 (×2): qty 1

## 2021-06-12 MED ORDER — APIXABAN 5 MG PO TABS
5.0000 mg | ORAL_TABLET | Freq: Two times a day (BID) | ORAL | Status: DC
  Administered 2021-06-12 – 2021-06-13 (×3): 5 mg via ORAL
  Filled 2021-06-12 (×3): qty 1

## 2021-06-12 MED ORDER — INSULIN ASPART 100 UNIT/ML IJ SOLN
0.0000 [IU] | Freq: Three times a day (TID) | INTRAMUSCULAR | Status: DC
  Administered 2021-06-13: 2 [IU] via SUBCUTANEOUS

## 2021-06-12 MED ORDER — DIGOXIN 125 MCG PO TABS
0.2500 mg | ORAL_TABLET | Freq: Every day | ORAL | Status: DC
  Administered 2021-06-12 – 2021-06-13 (×2): 0.25 mg via ORAL
  Filled 2021-06-12 (×2): qty 2

## 2021-06-12 MED ORDER — POTASSIUM CHLORIDE CRYS ER 20 MEQ PO TBCR
40.0000 meq | EXTENDED_RELEASE_TABLET | Freq: Once | ORAL | Status: AC
  Administered 2021-06-12: 40 meq via ORAL
  Filled 2021-06-12: qty 2

## 2021-06-12 MED ORDER — ASPIRIN 81 MG PO TBEC: 81.0000 mg | DELAYED_RELEASE_TABLET | Freq: Every day | ORAL | 11 refills | Status: DC

## 2021-06-12 NOTE — Progress Notes (Signed)
Mobility Specialist Progress Note    06/12/21 1415  Mobility  Activity Ambulated in hall  Level of Assistance Standby assist, set-up cues, supervision of patient - no hands on  Assistive Device None  Distance Ambulated (ft) 426 ft  Mobility Ambulated independently in hallway  Mobility Response Tolerated well  Mobility performed by Mobility specialist  Bed Position Chair  $Mobility charge 1 Mobility   Pre-Mobility: 81 HR, 140/59 BP During Mobility: 117 HR Post-Mobility: 87 HR, 155/57 BP  Pt received in chair and agreeable to walk. No complaints. Focused on pursed lip breathing. X1 slight DOE.   [place] Mobility Specialist  Mobility Specialist Phone: [phone]

## 2021-06-12 NOTE — TOC Benefit Eligibility Note (Signed)
Patient Product/process development scientist completed.    The patient is currently admitted and upon discharge could be taking Eliquis 5 mg.  The current 30 day co-pay is, $527.00 due to a $480.00 deductible remaining.  Should be $47.00 a month after meeting deductible.   The patient is insured through [employer] Medicare Part D     Roland Earl, CPhT Pharmacy Patient Advocate Specialist [facility] Antimicrobial Stewardship Team Direct Number: [phone]  Fax: [phone]

## 2021-06-12 NOTE — Progress Notes (Signed)
CARDIAC REHAB PHASE I   PRE:  Rate/Rhythm: 79 SR  BP:  Sitting: 149/56      SaO2: 95 RA  MODE:  Ambulation: 400 ft   POST:  Rate/Rhythm: 115 ST  BP:  Sitting: 193/59 --> 142/55    SaO2: 96 RA   Pt ambulated 465ft in hallway standby assist with front wheel walker. Pt assisted to BR than returned to recliner. Some SOB noted upon return to room. HR maintained sinus throughout session.  D/c education completed with pt. Pt educated on importance of site care and monitoring incisions daily. Encouraged continued IS use, walks, and sternal precautions. Pt given in-the-tube sheet along with heart healthy and diabetic diets. Reviewed restrictions and exercise guidelines. Will refer to CRP II [place]. Pt hopeful for d/c this weekend. Encouraged continued IS use and ambulation. Will continue to follow.  [id number] Reynold Bowen, RN BSN 06/12/2021 11:17 AM

## 2021-06-12 NOTE — Progress Notes (Signed)
7 Days Post-Op Procedure(s) (LRB): AORTIC VALVE REPLACEMENT (AVR), USING INSPIRIS RESILIA  AORTIC VALVE (N/A) TRANSESOPHAGEAL ECHOCARDIOGRAM (TEE) (N/A) Subjective: No complaints.  Objective: Vital signs in last 24 hours: Temp:  [98.2 F (36.8 C)-98.6 F (37 C)] 98.2 F (36.8 C) (09/30 0356) Pulse Rate:  [37-82] 64 (09/30 0356) Cardiac Rhythm: Heart block (09/29 1942) Resp:  [13-17] 16 (09/30 0356) BP: (121-149)/(47-65) 137/60 (09/30 0356) SpO2:  [96 %-99 %] 99 % (09/30 0356)  Hemodynamic parameters for last 24 hours:    Intake/Output from previous day: 09/29 0701 - 09/30 0700 In: 120 [P.O.:120] Out: -  Intake/Output this shift: No intake/output data recorded.  General appearance: alert and cooperative Neurologic: intact Heart: regular rate and rhythm, S1, S2 normal, no murmur Lungs: clear to auscultation bilaterally Extremities: edema mild Wound: incision healing well.  Lab Results: No results for input(s): WBC, HGB, HCT, PLT in the last 72 hours. BMET:  Recent Labs    06/11/21 0739 06/12/21 0444  NA 141 137  K 3.2* 4.3  CL 113* 104  CO2 21* 24  GLUCOSE 100* 133*  BUN 8 11  CREATININE 0.60 0.77  CALCIUM 7.1* 8.8*    PT/INR: No results for input(s): LABPROT, INR in the last 72 hours. ABG    Component Value Date/Time   PHART 7.306 (L) 06/05/2021 1738   HCO3 27.4 06/05/2021 1738   TCO2 29 06/05/2021 1738   ACIDBASEDEF 3.0 (H) 06/05/2021 1637   O2SAT 92.0 06/05/2021 1738   CBG (last 3)  Recent Labs    06/11/21 1955 06/11/21 2346 06/12/21 0358  GLUCAP 113* 162* 125*    Assessment/Plan: S/P Procedure(s) (LRB): AORTIC VALVE REPLACEMENT (AVR), USING INSPIRIS RESILIA  AORTIC VALVE (N/A) TRANSESOPHAGEAL ECHOCARDIOGRAM (TEE) (N/A)  POD 7 Hemodynamically stable.  Postop atrial fib with RVR that has been difficult to control. Cardizem started yesterday in addition to amiodarone for better rate control but still has episodes of tachycardia  although they were self limited. Will add digoxin for better rate control and change cardizem to daily. Start Eliquis for recurrent atrial fib.   She is diuresing and wt is now 7 lbs over preop. Continue Demedex and Kdur. Check lytes tomorrow.   DM: glucose under good control on present regimen.  Home when rate control adequate.   LOS: 7 days    Alleen Borne 06/12/2021

## 2021-06-12 NOTE — Care Management Important Message (Signed)
Important Message  Patient Details  Name: [name] MRN: [id number] Date of Birth: 10/21/1950   Medicare Important Message Given:  Yes     Renie Ora 06/12/2021, 9:52 AM

## 2021-06-13 LAB — BASIC METABOLIC PANEL
Anion gap: 9 (ref 5–15)
BUN: 9 mg/dL (ref 8–23)
CO2: 25 mmol/L (ref 22–32)
Calcium: 9.3 mg/dL (ref 8.9–10.3)
Chloride: 101 mmol/L (ref 98–111)
Creatinine, Ser: 0.87 mg/dL (ref 0.44–1.00)
GFR, Estimated: >60 mL/min (ref >60)
Glucose, Bld: 169 mg/dL — ABNORMAL HIGH (ref 70–99)
Potassium: 4.4 mmol/L (ref 3.5–5.1)
Sodium: 135 mmol/L (ref 135–145)

## 2021-06-13 LAB — GLUCOSE, CAPILLARY: Glucose-Capillary: 167 mg/dL — ABNORMAL HIGH (ref 70–99)

## 2021-06-13 MED ORDER — DIGOXIN 250 MCG PO TABS: 0.2500 mg | ORAL_TABLET | Freq: Every day | ORAL | 1 refills | Status: DC

## 2021-06-13 MED ORDER — DILTIAZEM HCL ER COATED BEADS 240 MG PO CP24: 240.0000 mg | ORAL_CAPSULE | Freq: Every day | ORAL | 1 refills | Status: DC

## 2021-06-13 MED ORDER — TRAMADOL HCL 50 MG PO TABS: 50.0000 mg | ORAL_TABLET | Freq: Four times a day (QID) | ORAL | 0 refills | Status: AC | PRN

## 2021-06-13 MED ORDER — APIXABAN 5 MG PO TABS: 5.0000 mg | ORAL_TABLET | Freq: Two times a day (BID) | ORAL | 1 refills | Status: DC

## 2021-06-13 MED ORDER — AMIODARONE HCL 200 MG PO TABS: 200.0000 mg | ORAL_TABLET | Freq: Two times a day (BID) | ORAL | 1 refills | Status: DC

## 2021-06-13 NOTE — TOC Transition Note (Addendum)
Transition of Care [facility]) - CM/SW Discharge Note   Patient Details  Name: ANEIRA CAVITT MRN: [id number] Date of Birth: Oct 04, 1950  [facility] CM/SW Contact:  Zenon Mayo, RN Phone Number: 06/13/2021, 9:58 AM   Clinical Narrative:    Patient is for dc today, NCM spoke with patient and her spouse, informed them of the copay for eliquis and that the deductible has not been met, there is 480.00 of the deductible remaining, then when that is met the medication will be 47.00, but if this is going to be too much for them to  let the MD know on the follow up apt.  Staff RN will give patient the 30 day free trial coupon for eliquis before discharge. Patient has pre op referral for home health needs with [facility]. They will follow up post discharge for start of care.  Notified Amy with [facility] of dc today.     Final next level of care: Home/Self Care Barriers to Discharge: No Barriers Identified   Patient Goals and CMS Choice Patient states their goals for this hospitalization and ongoing recovery are:: return home   Choice offered to / list presented to : NA  Discharge Placement                       Discharge Plan and Services                DME Arranged: Walker rolling DME Agency: [employer] Date DME Agency Contacted: 06/10/21 Time DME Agency Contacted: 2800 Representative spoke with at DME Agency: [employer]: Disease Management [place] Agency: [place] Date [place]: 06/11/21   Representative spoke with at [place]: [place] ([place]) Interventions     Readmission Risk Interventions No flowsheet data found.

## 2021-06-13 NOTE — Progress Notes (Addendum)
[address]       [place] [address]             [phone]      8 Days Post-Op Procedure(s) (LRB): AORTIC VALVE REPLACEMENT (AVR), USING INSPIRIS RESILIA  AORTIC VALVE (N/A) TRANSESOPHAGEAL ECHOCARDIOGRAM (TEE) (N/A) Subjective: Generally feels well  Objective: Vital signs in last 24 hours: Temp:  [98.5 F (36.9 C)-98.7 F (37.1 C)] 98.5 F (36.9 C) (10/01 0754) Pulse Rate:  [76-84] 84 (10/01 0754) Cardiac Rhythm: Normal sinus rhythm;Heart block (09/30 1900) Resp:  [16-20] 20 (10/01 0754) BP: (128-149)/(57-74) 128/57 (10/01 0754) SpO2:  [92 %-96 %] 93 % (10/01 0754) Weight:  [79.7 kg] 79.7 kg (10/01 0427)  Hemodynamic parameters for last 24 hours:    Intake/Output from previous day: 09/30 0701 - 10/01 0700 In: 240 [P.O.:240] Out: 700 [Urine:700] Intake/Output this shift: No intake/output data recorded.  General appearance: alert, cooperative, and no distress Heart: regular rate and rhythm Lungs: clear to auscultation bilaterally Abdomen: benign Extremities: no edema Wound: incis healing well  Lab Results: No results for input(s): WBC, HGB, HCT, PLT in the last 72 hours. BMET:  Recent Labs    06/12/21 0444 06/13/21 0435  NA 137 135  K 4.3 4.4  CL 104 101  CO2 24 25  GLUCOSE 133* 169*  BUN 11 9  CREATININE 0.77 0.87  CALCIUM 8.8* 9.3    PT/INR: No results for input(s): LABPROT, INR in the last 72 hours. ABG    Component Value Date/Time   PHART 7.306 (L) 06/05/2021 1738   HCO3 27.4 06/05/2021 1738   TCO2 29 06/05/2021 1738   ACIDBASEDEF 3.0 (H) 06/05/2021 1637   O2SAT 92.0 06/05/2021 1738   CBG (last 3)  Recent Labs    06/12/21 1623 06/12/21 2107 06/13/21 0618  GLUCAP 169* 195* 167*    Meds Scheduled Meds:  amiodarone  200 mg Oral BID   apixaban  5 mg Oral BID   aspirin EC  81 mg Oral Daily   atorvastatin  40 mg Oral Daily   chlorhexidine  15 mL Mouth Rinse BID   Chlorhexidine Gluconate Cloth  6 each  Topical Daily   [facility] Patient [facility] Family Education   Does not apply Once   digoxin  0.25 mg Oral Daily   diltiazem  240 mg Oral Daily   docusate sodium  200 mg Oral Daily   influenza vaccine adjuvanted  0.5 mL Intramuscular Tomorrow-1000   insulin aspart  0-9 Units Subcutaneous TID WC   insulin aspart  4 Units Subcutaneous TID WC   insulin detemir  35 Units Subcutaneous BID   levothyroxine  125 mcg Oral QAC breakfast   loratadine  10 mg Oral Daily   mouth rinse  15 mL Mouth Rinse q12n4p   melatonin  10 mg Oral QHS   pantoprazole  40 mg Oral Daily   rOPINIRole  0.5 mg Oral QHS   sertraline  100 mg Oral Daily   sodium chloride flush  10-40 mL Intracatheter Q12H   sodium chloride flush  3 mL Intravenous Q12H   torsemide  20 mg Oral Daily   Continuous Infusions:  sodium chloride Stopped (06/06/21 1239)   sodium chloride Stopped (06/06/21 0514)   sodium chloride 10 mL/hr at 06/05/21 1255   lactated ringers     PRN Meds:.sodium chloride, albuterol, ALPRAZolam, dextrose, metoprolol tartrate, ondansetron (ZOFRAN) IV, oxyCODONE, sodium chloride flush, sodium chloride flush, traMADol  Xrays  VAS [place] UPPER EXTREMITY VENOUS DUPLEX  Result Date: 06/11/2021 UPPER VENOUS STUDY  Patient Name:  [name]  Date of Exam:   06/11/2021 Medical Rec #: [id number]          Accession #:    [id number] Date of Birth: 26-Nov-1950         Patient Gender: F Patient Age:   15 years Exam Location:  [facility] Procedure:      VAS [place] UPPER EXTREMITY VENOUS DUPLEX Referring Phys: Evelene Croon --------------------------------------------------------------------------------  Indications: Swelling, and Pain Comparison Study: no prior Performing Technologist: [place] Ponder RVS  Examination Guidelines: A complete evaluation includes B-mode imaging, spectral Doppler, color Doppler, and power Doppler as needed of all accessible portions of each vessel. Bilateral testing is considered an  integral part of a complete examination. Limited examinations for reoccurring indications may be performed as noted.  Right Findings: +----------+------------+---------+-----------+----------+-------+ RIGHT     CompressiblePhasicitySpontaneousPropertiesSummary +----------+------------+---------+-----------+----------+-------+ IJV           Full       Yes       Yes                      +----------+------------+---------+-----------+----------+-------+ Subclavian    Full       Yes       Yes                      +----------+------------+---------+-----------+----------+-------+ Axillary      Full       Yes       Yes                      +----------+------------+---------+-----------+----------+-------+ Brachial      Full       Yes       Yes                      +----------+------------+---------+-----------+----------+-------+ Radial        Full                                          +----------+------------+---------+-----------+----------+-------+ Ulnar         Full                                          +----------+------------+---------+-----------+----------+-------+ Cephalic      Full                                          +----------+------------+---------+-----------+----------+-------+ Basilic       Full                                          +----------+------------+---------+-----------+----------+-------+  Left Findings: +----------+------------+---------+-----------+----------+-------+ LEFT      CompressiblePhasicitySpontaneousPropertiesSummary +----------+------------+---------+-----------+----------+-------+ Subclavian    Full       Yes       Yes                      +----------+------------+---------+-----------+----------+-------+  Summary:  Right: No evidence of deep  vein thrombosis in the upper extremity. No evidence of superficial vein thrombosis in the upper extremity.  Left: No evidence of thrombosis in the  subclavian.  *See table(s) above for measurements and observations.  Diagnosing physician: Gerarda Fraction Electronically signed by Gerarda Fraction on 06/11/2021 at 3:07:12 PM.    Final     Assessment/Plan: S/P Procedure(s) (LRB): AORTIC VALVE REPLACEMENT (AVR), USING INSPIRIS RESILIA  AORTIC VALVE (N/A) TRANSESOPHAGEAL ECHOCARDIOGRAM (TEE) (N/A)   1 afeb, VSS SBP 120's -140's, sinus rhythm without further tachy-dysrhythmias 2 sats ok on RA 3 weight below preop 4 normal renal fxn/K+ 5 conts insulin pump for DM at home 6 appears stable for d/c   LOS: 8 days    Rowe Clack PA-C Pager [phone] 06/13/2021    Chart reviewed, patient examined, agree with above. She feels well. Rhythm is staying sinus 80-90 on amiodarone, Cardizem and digoxin. Wt is down to preop. Plan home today.

## 2021-06-13 NOTE — Progress Notes (Signed)
CARDIAC REHAB PHASE I   Pt had PICC line removed. Reinforced education and IS usage. Pt had no questions and is looking forward to discharge.   [id number] Harrie Jeans ACSM-EP 06/13/2021 11:41 AM

## 2021-06-13 NOTE — Progress Notes (Signed)
Pt to be discharged from 4E to home with husband. Telemetry and PICC line removed. VSS.   Brooke Pace, RN

## 2021-06-15 DIAGNOSIS — Z7982 Long term (current) use of aspirin: Secondary | ICD-10-CM | POA: Diagnosis not present

## 2021-06-15 DIAGNOSIS — F419 Anxiety disorder, unspecified: Secondary | ICD-10-CM | POA: Diagnosis not present

## 2021-06-15 DIAGNOSIS — Z7901 Long term (current) use of anticoagulants: Secondary | ICD-10-CM | POA: Diagnosis not present

## 2021-06-15 DIAGNOSIS — G4733 Obstructive sleep apnea (adult) (pediatric): Secondary | ICD-10-CM | POA: Diagnosis not present

## 2021-06-15 DIAGNOSIS — E039 Hypothyroidism, unspecified: Secondary | ICD-10-CM | POA: Diagnosis not present

## 2021-06-15 DIAGNOSIS — E1140 Type 2 diabetes mellitus with diabetic neuropathy, unspecified: Secondary | ICD-10-CM | POA: Diagnosis not present

## 2021-06-15 DIAGNOSIS — K219 Gastro-esophageal reflux disease without esophagitis: Secondary | ICD-10-CM | POA: Diagnosis not present

## 2021-06-15 DIAGNOSIS — E785 Hyperlipidemia, unspecified: Secondary | ICD-10-CM | POA: Diagnosis not present

## 2021-06-15 DIAGNOSIS — M797 Fibromyalgia: Secondary | ICD-10-CM | POA: Diagnosis not present

## 2021-06-15 DIAGNOSIS — Z48812 Encounter for surgical aftercare following surgery on the circulatory system: Secondary | ICD-10-CM | POA: Diagnosis not present

## 2021-06-15 DIAGNOSIS — I350 Nonrheumatic aortic (valve) stenosis: Secondary | ICD-10-CM | POA: Diagnosis not present

## 2021-06-15 DIAGNOSIS — F32A Depression, unspecified: Secondary | ICD-10-CM | POA: Diagnosis not present

## 2021-06-15 DIAGNOSIS — Z794 Long term (current) use of insulin: Secondary | ICD-10-CM | POA: Diagnosis not present

## 2021-06-15 DIAGNOSIS — Z954 Presence of other heart-valve replacement: Secondary | ICD-10-CM | POA: Diagnosis not present

## 2021-06-15 DIAGNOSIS — I10 Essential (primary) hypertension: Secondary | ICD-10-CM | POA: Diagnosis not present

## 2021-06-15 DIAGNOSIS — J45909 Unspecified asthma, uncomplicated: Secondary | ICD-10-CM | POA: Diagnosis not present

## 2021-06-15 DIAGNOSIS — G2581 Restless legs syndrome: Secondary | ICD-10-CM | POA: Diagnosis not present

## 2021-06-15 DIAGNOSIS — Z9989 Dependence on other enabling machines and devices: Secondary | ICD-10-CM | POA: Diagnosis not present

## 2021-06-16 ENCOUNTER — Ambulatory Visit (INDEPENDENT_AMBULATORY_CARE_PROVIDER_SITE_OTHER): Payer: Self-pay

## 2021-06-16 ENCOUNTER — Other Ambulatory Visit: Payer: Self-pay

## 2021-06-16 DIAGNOSIS — Z4802 Encounter for removal of sutures: Secondary | ICD-10-CM

## 2021-06-16 DIAGNOSIS — I10 Essential (primary) hypertension: Secondary | ICD-10-CM | POA: Diagnosis not present

## 2021-06-16 DIAGNOSIS — E1140 Type 2 diabetes mellitus with diabetic neuropathy, unspecified: Secondary | ICD-10-CM | POA: Diagnosis not present

## 2021-06-16 DIAGNOSIS — I350 Nonrheumatic aortic (valve) stenosis: Secondary | ICD-10-CM | POA: Diagnosis not present

## 2021-06-16 DIAGNOSIS — Z794 Long term (current) use of insulin: Secondary | ICD-10-CM | POA: Diagnosis not present

## 2021-06-16 DIAGNOSIS — Z954 Presence of other heart-valve replacement: Secondary | ICD-10-CM | POA: Diagnosis not present

## 2021-06-16 DIAGNOSIS — Z48812 Encounter for surgical aftercare following surgery on the circulatory system: Secondary | ICD-10-CM | POA: Diagnosis not present

## 2021-06-17 ENCOUNTER — Telehealth (HOSPITAL_COMMUNITY): Payer: Self-pay

## 2021-06-17 DIAGNOSIS — I10 Essential (primary) hypertension: Secondary | ICD-10-CM | POA: Diagnosis not present

## 2021-06-17 DIAGNOSIS — Z794 Long term (current) use of insulin: Secondary | ICD-10-CM | POA: Diagnosis not present

## 2021-06-17 DIAGNOSIS — E1140 Type 2 diabetes mellitus with diabetic neuropathy, unspecified: Secondary | ICD-10-CM | POA: Diagnosis not present

## 2021-06-17 DIAGNOSIS — Z954 Presence of other heart-valve replacement: Secondary | ICD-10-CM | POA: Diagnosis not present

## 2021-06-17 DIAGNOSIS — Z48812 Encounter for surgical aftercare following surgery on the circulatory system: Secondary | ICD-10-CM | POA: Diagnosis not present

## 2021-06-17 DIAGNOSIS — I350 Nonrheumatic aortic (valve) stenosis: Secondary | ICD-10-CM | POA: Diagnosis not present

## 2021-06-17 NOTE — Telephone Encounter (Signed)
Called patient to see if she is interested in the Cardiac Rehab Program. Patient expressed interest. Explained scheduling process and went over insurance, patient verbalized understanding. Will contact patient for scheduling once f/u has been completed. 

## 2021-06-17 NOTE — Telephone Encounter (Signed)
Pt insurance is active and benefits verified through Medicare a/b Co-pay 0, DED $233/$233 met, out of pocket 0/0 met, co-insurance 20%. no pre-authorization required. Passport, 06/17/2021_0 :08pm, REF# [id number]  2ndary insurance is active and benefits verified through [place]. Co-pay 0, DED 0/0 met, out of pocket 0/0 met, co-insurance 0%. No pre-authorization required. Passport, 06/17/2021_1 :29pm, REF# [id number]   Will contact patient to see if she is interested in the Cardiac Rehab Program. If interested, patient will need to complete follow up appt. Once completed, patient will be contacted for scheduling upon review by the RN Navigator.

## 2021-06-19 DIAGNOSIS — Z794 Long term (current) use of insulin: Secondary | ICD-10-CM | POA: Diagnosis not present

## 2021-06-19 DIAGNOSIS — I10 Essential (primary) hypertension: Secondary | ICD-10-CM | POA: Diagnosis not present

## 2021-06-19 DIAGNOSIS — Z954 Presence of other heart-valve replacement: Secondary | ICD-10-CM | POA: Diagnosis not present

## 2021-06-19 DIAGNOSIS — Z48812 Encounter for surgical aftercare following surgery on the circulatory system: Secondary | ICD-10-CM | POA: Diagnosis not present

## 2021-06-19 DIAGNOSIS — E1140 Type 2 diabetes mellitus with diabetic neuropathy, unspecified: Secondary | ICD-10-CM | POA: Diagnosis not present

## 2021-06-19 DIAGNOSIS — I350 Nonrheumatic aortic (valve) stenosis: Secondary | ICD-10-CM | POA: Diagnosis not present

## 2021-06-22 ENCOUNTER — Other Ambulatory Visit: Payer: Self-pay

## 2021-06-22 ENCOUNTER — Encounter: Payer: Self-pay | Admitting: Cardiology

## 2021-06-22 ENCOUNTER — Ambulatory Visit: Payer: Medicare Other | Admitting: Cardiology

## 2021-06-22 VITALS — BP 166/74 | HR 80 | Temp 97.8°F | Resp 16 | Ht 63.0 in | Wt 175.8 lb

## 2021-06-22 DIAGNOSIS — I10 Essential (primary) hypertension: Secondary | ICD-10-CM | POA: Diagnosis not present

## 2021-06-22 DIAGNOSIS — Z954 Presence of other heart-valve replacement: Secondary | ICD-10-CM | POA: Diagnosis not present

## 2021-06-22 DIAGNOSIS — I6522 Occlusion and stenosis of left carotid artery: Secondary | ICD-10-CM

## 2021-06-22 DIAGNOSIS — Q231 Congenital insufficiency of aortic valve: Secondary | ICD-10-CM

## 2021-06-22 MED ORDER — LOSARTAN POTASSIUM-HCTZ 50-12.5 MG PO TABS: 1.0000 | ORAL_TABLET | Freq: Every morning | ORAL | 2 refills | Status: DC

## 2021-06-22 NOTE — Progress Notes (Signed)
Primary Physician/Referring:  Janie Morning, DO  Patient ID: [name], female    DOB: 23-Jan-1951, 70 y.o.   MRN: [id number]  Chief Complaint  Patient presents with   Aortic Stenosis    AV replacement    HPI:    [name]  is a 70 y.o. Caucasian female with hypertension, diet controlled DM, mild obesity, hyperlipidemia, fibromyalgia and PSVT, bicuspid aortic valve with aortic stenosis.  Patient underwent aortic valve replacement with a bioprosthetic tissue valve on 06/05/2021 and now presents for follow-up.  States that her dyspnea is still present but is gradually improving.  Denies any leg edema, PND or orthopnea.  No chest pain.  Tolerating all her medications well.  Past Medical History:  Diagnosis Date   Anxiety    Aortic regurgitation    Arthritis    Asthma    Depression    Depression    Diabetes mellitus    Dyspnea    Facet joint disease of lumbosacral region    Fibromyalgia    GERD (gastroesophageal reflux disease)    Goiter    Hair loss    Heart murmur    Hypertension    Menopause    Mitral regurgitation    Mitral valve prolapse    Other and unspecified hyperlipidemia    Positive PPD    Sleep apnea    Small bowel obstruction ([place]) 02/09/2018   SVT (supraventricular tachycardia) ([place])    Vasculitis (H[facility])    Past Surgical History:  Procedure Laterality Date   ABDOMINAL HYSTERECTOMY     AORTIC VALVE REPLACEMENT N/A 06/05/2021   Procedure: AORTIC VALVE REPLACEMENT (AVR), USING 27MM INSPIRIS RESILIA  AORTIC VALVE;  Surgeon: Gaye Pollack, MD;  Location: [facility];  Service: Open Heart Surgery;  Laterality: N/A;   APPENDECTOMY     CARDIAC CATHETERIZATION     CHOLECYSTECTOMY     DIAGNOSTIC LAPAROSCOPY     EYE SURGERY Bilateral    cataract removal   PARTIAL HYSTERECTOMY     RIGHT/LEFT HEART CATH AND CORONARY ANGIOGRAPHY N/A 04/28/2021   Procedure: RIGHT/LEFT HEART CATH AND CORONARY ANGIOGRAPHY;  Surgeon: Adrian Prows, MD;  Location: [facility];  Service: Cardiovascular;  Laterality: N/A;   TEE WITHOUT CARDIOVERSION N/A 06/05/2021   Procedure: TRANSESOPHAGEAL ECHOCARDIOGRAM (TEE);  Surgeon: Gaye Pollack, MD;  Location: [facility];  Service: Open Heart Surgery;  Laterality: N/A;   Family History  Problem Relation Age of Onset   Hepatitis Mother 7       died from Hep A   Diabetes Father    Cancer Brother    Crohn's disease Brother    Alcohol abuse Brother    Sleep apnea Neg Hx     Social History   Tobacco Use   Smoking status: Former    Years: 15.00    Types: Cigarettes    Quit date: 03/13/1988    Years since quitting: 33.2   Smokeless tobacco: Never  Substance Use Topics   Alcohol use: No   Marital Status: Married  ROS  Review of Systems  Cardiovascular:  Positive for dyspnea on exertion. Negative for leg swelling.  Musculoskeletal:  Positive for arthritis and back pain. Negative for myalgias.  Gastrointestinal:  Negative for heartburn and melena.  Objective  Blood pressure (!) 166/74, pulse 80, temperature 97.8 F (36.6 C), temperature source Temporal, resp. rate 16, height _0  (1.6 m), weight 175 lb 12.8 oz (79.7 kg), SpO2 96 %.  Vitals with BMI  06/22/2021 06/22/2021 06/13/2021  Height - _0  -  Weight - 175 lbs 13 oz -  BMI - 16.10 -  Systolic 960 454 098  Diastolic 74 77 57  Pulse 80 88 84     Physical Exam Constitutional:      General: She is not in acute distress.    Comments: Moderately built and mildly obese.  Most obesity is truncal obesity.  Neck:     Vascular: No carotid bruit or JVD.  Cardiovascular:     Rate and Rhythm: Normal rate and regular rhythm.     Pulses: Normal pulses and intact distal pulses.     Heart sounds: Murmur heard.  Low-pitched early systolic murmur is present with a grade of 2/6 at the upper right sternal border.    No gallop.  Pulmonary:     Effort: Pulmonary effort is normal. No accessory muscle usage or respiratory distress.     Breath sounds: Normal breath  sounds.  Abdominal:     General: Bowel sounds are normal.     Palpations: Abdomen is soft.     Comments: Reducible ventral hernia noted.  Obese abdomen.   Musculoskeletal:     Right lower leg: No edema.     Left lower leg: No edema.   Laboratory examination:   Recent Labs    06/11/21 0739 06/12/21 0444 06/13/21 0435  NA 141 137 135  K 3.2* 4.3 4.4  CL 113* 104 101  CO2 21* 24 25  GLUCOSE 100* 133* 169*  BUN _1 CREATININE 0.60 0.77 0.87  CALCIUM 7.1* 8.8* 9.3  GFRNONAA >60 >60 >60   estimated creatinine clearance is 59.3 mL/min (by C-G formula based on SCr of 0.87 mg/dL).  CMP Latest Ref Rng & Units 06/13/2021 06/12/2021 06/11/2021  Glucose 70 - 99 mg/dL 169(H) 133(H) 100(H)  BUN 8 - 23 mg/dL _2 Creatinine 0.44 - 1.00 mg/dL 0.87 0.77 0.60  Sodium 135 - 145 mmol/L 135 137 141  Potassium 3.5 - 5.1 mmol/L 4.4 4.3 3.2(L)  Chloride 98 - 111 mmol/L 101 104 113(H)  CO2 22 - 32 mmol/L 25 24 21(L)  Calcium 8.9 - 10.3 mg/dL 9.3 8.8(L) 7.1(L)  Total Protein 6.5 - 8.1 g/dL - - -  Total Bilirubin 0.3 - 1.2 mg/dL - - -  Alkaline Phos 38 - 126 U/L - - -  AST 15 - 41 U/L - - -  ALT 0 - 44 U/L - - -   CBC Latest Ref Rng & Units 06/08/2021 06/07/2021 06/06/2021  WBC 4.0 - 10.5 K/uL 17.3(H) 19.3(H) 22.2(H)  Hemoglobin 12.0 - 15.0 g/dL 10.3(L) 10.4(L) 10.5(L)  Hematocrit 36.0 - 46.0 % 32.2(L) 31.7(L) 33.0(L)  Platelets 150 - 400 K/uL 148(L) 117(L) 138(L)   External labs:   Labs 04/14/2021:  Sodium 142, potassium 4.2, BUN 13, creatinine 0.93, EGFR 71 mL.  Total cholesterol 147, triglycerides 208, HDL 42, LDL 63.  Non-HDL cholesterol 105.  TSH normal at 1.32.  Vitamin D normal at 35.7.  Labs 08/28/2020:  Hb 14.6/HCT 44.9, platelets 229.  Serum glucose 157 mg, BUN 14, creatinine 1.1, EGFR 51.9.  LFT normal.  A1c 7.1%.TSH normal.  Total cholesterol 106, triglycerides 157, HDL 40, LDL 55.   Labs 07/16/2019:  Hb 14.3/HCT 44.1, platelets 266, indicis normal.  Serum  glucose 207 mg, BUN 13, creatinine 1.0, EGFR 58 mL.  Potassium 4.8, sodium 141, CMP normal otherwise.  Total cholesterol 126, triglycerides 157, HDL 40, LDL 55.  Non-HDL cholesterol 86.  TSH normal at 0.77.  Medications and allergies   Allergies  Allergen Reactions   Penicillins Anaphylaxis    Has patient had a PCN reaction causing immediate rash, facial/tongue/throat swelling, SOB or lightheadedness with hypotension: No Has patient had a PCN reaction causing severe rash involving mucus membranes or skin necrosis: No Has patient had a PCN reaction that required hospitalization: Yes/ Has patient had a PCN reaction occurring within the last 10 years: No If all of the above answers are "NO", then may proceed with Cephalosporin use.    Ace Inhibitors     INDUCED BRONCHOSPASM   Contrast Media [Iodinated Diagnostic Agents]      Current Outpatient Medications  Medication Instructions   acetaminophen (TYLENOL) 500 mg, Oral, Every 6 hours PRN   albuterol (PROVENTIL HFA;VENTOLIN HFA) 108 (90 Base) MCG/ACT inhaler 2 puffs, Inhalation, Every 6 hours PRN   albuterol (PROVENTIL) 2.5 mg, Nebulization, Every 6 hours PRN, ICD10:J45.909   ALPRAZolam (XANAX) 1 MG tablet TAKE ONE-HALF TABLET BY MOUTH ONCE DAILY AS NEEDED FOR STRESS   apixaban (ELIQUIS) 5 mg, Oral, 2 times daily   atorvastatin (LIPITOR) 40 mg, Oral, Daily   augmented betamethasone dipropionate (DIPROLENE-AF) 5.74 % cream 1 application, Topical, 2 times daily PRN   B-12 5,000 mcg, Oral, Daily   Biotin 10,000 mcg, Oral, Daily   carboxymethylcellulose (REFRESH PLUS) 0.5 % SOLN 1 drop, Both Eyes, 3 times daily PRN   diltiazem (CARDIZEM CD) 240 mg, Oral, Daily   Glucose 15-30 g, Oral, Daily PRN   insulin aspart (NOVOLOG) 100 UNIT/ML injection Subcutaneous, Continuous, Uses in pump basal rate 3.0 units per hour, bolus 1 unit per 30 carbs   L-Lysine 500 mg, Oral, Daily   levocetirizine (XYZAL ALLERGY 24HR) 5 mg, Oral, Every evening    levothyroxine (SYNTHROID) 125 mcg, Oral, Daily before breakfast   losartan-hydrochlorothiazide (HYZAAR) 50-12.5 MG tablet 1 tablet, Oral, Every morning   Magnesium 500 mg, Oral, Daily at bedtime   Melatonin 10 mg, Oral, Daily at bedtime   omeprazole (PRILOSEC) 40 mg, Oral, Daily   OVER THE COUNTER MEDICATION 4 tablets, Oral, Daily at bedtime, Focus factor otc supplement   Probiotic Product (PROBIOTIC PO) 1 capsule, Oral, Daily, 20 billion active cultures   rOPINIRole (REQUIP) 0.5 mg, Oral, Daily at bedtime   sertraline (ZOLOFT) 100 mg, Oral, Daily   simethicone (GAS-X) 80 mg, Oral, 4 times daily PRN   traMADol (ULTRAM) 50 mg, Oral, Every 6 hours PRN   Vitamin D 1,000 Units, Oral, Daily   Radiology:   No results found.  Cardiac Studies:   Exercise myoview stress 09/02/2014: 1. The resting electrocardiogram demonstrated normal sinus rhythm, normal resting conduction, no resting arrhythmias and nonspecific ST-T changes. The stress electrocardiogram was normal. The patient performed treadmill exercise using a Bruce protocol, completing 6:45 minutes. The patient completed an estimated workload of 10.2 METS. The blood pressure response to exercise was normal. The stress test was terminated because of fatigue. 2. The overall quality of the study is good. There is no evidence of abnormal lung activity. Stress and rest SPECT images demonstrate homogeneous tracer distribution throughout the myocardium. Gated SPECT imaging reveals normal myocardial thickening and wall motion. The left ventricular ejection fraction was normal (44%) but visually appears to be normal. This is a low risk study. No significant change from Exercise sestamibi stress test on 05/08/2009.  Sleep study 10/17/2014 (Dohmeier, MD): Mild to moderate obstructive sleep apnea. Effectively treated with CPAP. Compliant  PCV ECHOCARDIOGRAM  COMPLETE 03/25/2021 Left ventricle cavity is normal in size. Moderate concentric hypertrophy of the  left ventricle. Normal global wall motion. Normal LV systolic function with visual EF 55-60%. Doppler evidence of grade II (pseudonormal) diastolic dysfunction, elevated LAP. Bicuspid aortic valve with raphe between left and right coronary cusp. Moderate calcification. Severe aortic stenosis. Vmax 4.0 m/sec. Aortic valve peak gradient of 66.6 mmHg and mean gradient of 41.0 mmHg, aortic valve area 0.6 cm by continuity equation. Mild aortic regurgitation. Mild (Grade I) mitral regurgitation. Mild tricuspid regurgitation. No evidence of pulmonary hypertension. Compared to previous studies in 2020 and 2021, aortic stenosis severity is increased from moderate.  Carotid artery duplex 03/25/2021: No evidence of significant stenosis in the right carotid vessels. Duplex suggests stenosis in the left internal carotid artery (50-69%). Antegrade right vertebral artery flow. Antegrade left vertebral artery flow. Compared to the study done on 05/10/2017, left carotid stenosis is new.  Follow up in six months is appropriate if clinically indicated.  Right and left heart catheterization 04/28/2021: Normal coronary arteries, left dominant circulation. LV: 171/8, EDP 17.  Ao 104/46, mean 67 mmHg.  Peak to peak pressure gradient 50 and mean pressure gradient across the aortic valve of 44.6 mmHg.  Calculated aortic valve area 0.89 cm.  QP/QS 1.00. RA 16/13, mean 13; RV 35/1, EDP 9; PA 26/9, mean 14.  PA saturation 74%.  PW 23/24, mean 22 mmHg. CO 5.17, CI 2.83.  Normal.  EKG  EKG 06/22/2021: Sinus rhythm with first-degree block at rate of 71 bpm, left atrial enlargement, left axis deviation.  LVH with repolarization abnormality, cannot exclude lateral ischemia.  Compared to prior EKG 03/10/2021, ST-T wave abnormalities    Assessment     ICD-10-CM   1. H/O aortic valve replacement with tissue graft 27MM INSPIRIS RESILIA  AORTIC VALVE 05/31/2021  Z95.4 EKG 12-Lead    2. Bicuspid aortic valve  Q23.1  losartan-hydrochlorothiazide (HYZAAR) 50-12.5 MG tablet    3. Essential hypertension  I10 losartan-hydrochlorothiazide (HYZAAR) 50-12.5 MG tablet    Basic metabolic panel    [id number]    4. Asymptomatic stenosis of left carotid artery  I65.22       Meds ordered this encounter  Medications   losartan-hydrochlorothiazide (HYZAAR) 50-12.5 MG tablet    Sig: Take 1 tablet by mouth every morning.    Dispense:  30 tablet    Refill:  2     Medications Discontinued During This Encounter  Medication Reason   digoxin (LANOXIN) 0.25 MG tablet Error   amiodarone (PACERONE) 200 MG tablet Completed Course   aspirin EC 81 MG EC tablet Discontinued by provider     Recommendations:   [name]  is a 70 y.o. Caucasian female with hypertension, diet controlled DM, mild obesity, hyperlipidemia, fibromyalgia and PSVT, bicuspid aortic valve with aortic stenosis.  Patient underwent aortic valve replacement with a bioprosthetic tissue valve on 06/05/2021 and now presents for follow-up.  On the fourth day of hospitalization, patient did have A. fib with RVR, she was discharged home on digoxin along with amiodarone.  She still has dyspnea on exertion but is gradually getting better and she is increasing physical activity.  Surgical site has healed well.  Discontinue amiodarone, advised her to continue Eliquis for now, I will see her back in 6 weeks at which time if she is maintaining sinus rhythm I will discontinue Eliquis as well.  For now I will discontinue aspirin to reduce risk of bleeding, will reinitiate this once Eliquis is taken  off, patient needs aspirin for asymptomatic carotid stenosis.  Advise lipids are well controlled, blood pressure is elevated today and I will restart her losartan HCT 50/12.5 mg in the morning both for bicuspid aortic valve to prevent aortopathy and also for hypertension.  Weight loss was discussed extensively with the patient.  She states that she is having difficulty  with weight loss and would like to check her TSH.  Orders placed and I will forward this to Dr. Chalmers Cater as well.  This was a 40-minute office visit encounter in evaluation of hospital records and labs and coordination of care.  She has been scheduled for an echocardiogram post aortic valve replacement to establish baseline.    CC: Hassan Buckler, MD  Adrian Prows, MD, [facility] 06/22/2021, 1:49 PM Office: [phone] Fax: [phone] Pager: [phone]

## 2021-06-24 DIAGNOSIS — Z954 Presence of other heart-valve replacement: Secondary | ICD-10-CM | POA: Diagnosis not present

## 2021-06-24 DIAGNOSIS — E1140 Type 2 diabetes mellitus with diabetic neuropathy, unspecified: Secondary | ICD-10-CM | POA: Diagnosis not present

## 2021-06-24 DIAGNOSIS — I350 Nonrheumatic aortic (valve) stenosis: Secondary | ICD-10-CM | POA: Diagnosis not present

## 2021-06-24 DIAGNOSIS — I10 Essential (primary) hypertension: Secondary | ICD-10-CM | POA: Diagnosis not present

## 2021-06-24 DIAGNOSIS — Z794 Long term (current) use of insulin: Secondary | ICD-10-CM | POA: Diagnosis not present

## 2021-06-24 DIAGNOSIS — Z48812 Encounter for surgical aftercare following surgery on the circulatory system: Secondary | ICD-10-CM | POA: Diagnosis not present

## 2021-06-25 ENCOUNTER — Ambulatory Visit: Payer: Medicare Other | Admitting: Cardiology

## 2021-06-25 DIAGNOSIS — I350 Nonrheumatic aortic (valve) stenosis: Secondary | ICD-10-CM | POA: Diagnosis not present

## 2021-06-25 DIAGNOSIS — Z48812 Encounter for surgical aftercare following surgery on the circulatory system: Secondary | ICD-10-CM | POA: Diagnosis not present

## 2021-06-25 DIAGNOSIS — E1140 Type 2 diabetes mellitus with diabetic neuropathy, unspecified: Secondary | ICD-10-CM | POA: Diagnosis not present

## 2021-06-25 DIAGNOSIS — Z794 Long term (current) use of insulin: Secondary | ICD-10-CM | POA: Diagnosis not present

## 2021-06-25 DIAGNOSIS — Z954 Presence of other heart-valve replacement: Secondary | ICD-10-CM | POA: Diagnosis not present

## 2021-06-25 DIAGNOSIS — I10 Essential (primary) hypertension: Secondary | ICD-10-CM | POA: Diagnosis not present

## 2021-06-26 DIAGNOSIS — Z48812 Encounter for surgical aftercare following surgery on the circulatory system: Secondary | ICD-10-CM | POA: Diagnosis not present

## 2021-06-26 DIAGNOSIS — Z794 Long term (current) use of insulin: Secondary | ICD-10-CM | POA: Diagnosis not present

## 2021-06-26 DIAGNOSIS — E1140 Type 2 diabetes mellitus with diabetic neuropathy, unspecified: Secondary | ICD-10-CM | POA: Diagnosis not present

## 2021-06-26 DIAGNOSIS — Z954 Presence of other heart-valve replacement: Secondary | ICD-10-CM | POA: Diagnosis not present

## 2021-06-26 DIAGNOSIS — I10 Essential (primary) hypertension: Secondary | ICD-10-CM | POA: Diagnosis not present

## 2021-06-26 DIAGNOSIS — I350 Nonrheumatic aortic (valve) stenosis: Secondary | ICD-10-CM | POA: Diagnosis not present

## 2021-06-29 ENCOUNTER — Other Ambulatory Visit: Payer: Self-pay | Admitting: Surgery

## 2021-06-29 DIAGNOSIS — Z794 Long term (current) use of insulin: Secondary | ICD-10-CM | POA: Diagnosis not present

## 2021-06-29 DIAGNOSIS — Z954 Presence of other heart-valve replacement: Secondary | ICD-10-CM | POA: Diagnosis not present

## 2021-06-29 DIAGNOSIS — Z48812 Encounter for surgical aftercare following surgery on the circulatory system: Secondary | ICD-10-CM | POA: Diagnosis not present

## 2021-06-29 DIAGNOSIS — I10 Essential (primary) hypertension: Secondary | ICD-10-CM | POA: Diagnosis not present

## 2021-06-29 DIAGNOSIS — Z952 Presence of prosthetic heart valve: Secondary | ICD-10-CM

## 2021-06-29 DIAGNOSIS — I350 Nonrheumatic aortic (valve) stenosis: Secondary | ICD-10-CM | POA: Diagnosis not present

## 2021-06-29 DIAGNOSIS — E1140 Type 2 diabetes mellitus with diabetic neuropathy, unspecified: Secondary | ICD-10-CM | POA: Diagnosis not present

## 2021-07-01 ENCOUNTER — Ambulatory Visit
Admission: RE | Admit: 2021-07-01 | Discharge: 2021-07-01 | Disposition: A | Discharge status: Home / Self Care | Payer: Medicare Other | Source: Ambulatory Visit | Attending: Surgery | Admitting: Surgery

## 2021-07-01 ENCOUNTER — Ambulatory Visit (INDEPENDENT_AMBULATORY_CARE_PROVIDER_SITE_OTHER): Payer: Self-pay | Admitting: Surgery

## 2021-07-01 ENCOUNTER — Encounter: Payer: Self-pay | Admitting: Surgery

## 2021-07-01 ENCOUNTER — Other Ambulatory Visit: Payer: Self-pay

## 2021-07-01 VITALS — BP 152/80 | HR 100 | Resp 20 | Ht 63.0 in | Wt 172.0 lb

## 2021-07-01 DIAGNOSIS — Z952 Presence of prosthetic heart valve: Secondary | ICD-10-CM

## 2021-07-01 NOTE — Progress Notes (Signed)
HPI: Patient returns for routine postoperative follow-up having undergone aortic valve replacement using a 27 mm INSPIRIS RESILIA pericardial valve on 06/05/2021. The patient's early postoperative recovery while in the hospital was notable for development of recurrent postoperative atrial fibrillation that was converted with amiodarone.  She was started on Eliquis due to recurrence. Since hospital discharge the patient reports that she has been feeling well.  She initially has some shortness of breath without as much better.  She is ambulating without chest pain or shortness of breath.  She saw Dr. Jacinto Halim recently and her amiodarone was discontinued.  Her blood pressure was elevated when she saw Dr. Jacinto Halim and her losartan HCT was resumed at 50/12.5 daily.  She has been checking her blood pressure at home and has a record with her today.  Her pressure runs in the 140s most of the time.   Current Outpatient Medications  Medication Sig Dispense Refill   acetaminophen (TYLENOL) 500 MG tablet Take 500 mg by mouth every 6 (six) hours as needed for headache.     albuterol (PROVENTIL HFA;VENTOLIN HFA) 108 (90 Base) MCG/ACT inhaler Inhale 2 puffs into the lungs every 6 (six) hours as needed. (Patient taking differently: Inhale 2 puffs into the lungs every 6 (six) hours as needed (Asthma).) 8.5 g 11   albuterol (PROVENTIL) (2.5 MG/3ML) 0.083% nebulizer solution Take 3 mLs (2.5 mg total) by nebulization every 6 (six) hours as needed. HCW23:J62.831 (Patient taking differently: Take 2.5 mg by nebulization every 6 (six) hours as needed (Asthma). DVV61:Y07.371) 75 mL 1   ALPRAZolam (XANAX) 1 MG tablet TAKE ONE-HALF TABLET BY MOUTH ONCE DAILY AS NEEDED FOR STRESS (Patient taking differently: Take 0.5-1 mg by mouth daily as needed for anxiety.) 90 tablet 0   apixaban (ELIQUIS) 5 MG TABS tablet Take 1 tablet (5 mg total) by mouth 2 (two) times daily. 60 tablet 1   atorvastatin (LIPITOR) 40 MG tablet Take 1 tablet (40  mg total) daily by mouth. 90 tablet 3   augmented betamethasone dipropionate (DIPROLENE-AF) 0.05 % cream Apply 1 application topically 2 (two) times daily as needed (eczema).     Biotin 06269 MCG TABS Take 10,000 mcg by mouth daily.     carboxymethylcellulose (REFRESH PLUS) 0.5 % SOLN Place 1 drop into both eyes 3 (three) times daily as needed (dry eyes).     Cholecalciferol (VITAMIN D) 50 MCG (2000 UT) CAPS Take 0.5 capsules (1,000 Units total) by mouth daily.     Cyanocobalamin (B-12) 5000 MCG CAPS Take 5,000 mcg by mouth daily.     diltiazem (CARDIZEM CD) 240 MG 24 hr capsule Take 1 capsule (240 mg total) by mouth daily. 30 capsule 1   Glucose 15 g PACK Take 15-30 g by mouth daily as needed (low blood sugar).     insulin aspart (NOVOLOG) 100 UNIT/ML injection Inject into the skin continuous. Uses in pump basal rate 3.0 units per hour, bolus 1 unit per 30 carbs     L-Lysine 500 MG CAPS Take 500 mg by mouth daily.     levocetirizine (XYZAL ALLERGY 24HR) 5 MG tablet Take 1 tablet (5 mg total) by mouth every evening. 30 tablet 0   levothyroxine (SYNTHROID) 125 MCG tablet Take 125 mcg by mouth daily before breakfast.     losartan-hydrochlorothiazide (HYZAAR) 50-12.5 MG tablet Take 1 tablet by mouth every morning. 30 tablet 2   Magnesium 500 MG TABS Take 500 mg by mouth at bedtime.     Melatonin 10 MG  TABS Take 10 mg by mouth at bedtime.     omeprazole (PRILOSEC) 40 MG capsule Take 40 mg by mouth daily.     OVER THE COUNTER MEDICATION Take 4 tablets by mouth at bedtime. Focus factor otc supplement     Probiotic Product (PROBIOTIC PO) Take 1 capsule by mouth daily. 20 billion active cultures     rOPINIRole (REQUIP) 0.5 MG tablet Take 0.5 mg by mouth at bedtime.     sertraline (ZOLOFT) 100 MG tablet Take 100 mg by mouth daily.     traMADol (ULTRAM) 50 MG tablet Take 50 mg by mouth every 6 (six) hours as needed.     simethicone (GAS-X) 80 MG chewable tablet Chew 1 tablet (80 mg total) by mouth 4  (four) times daily as needed for flatulence. 100 tablet 2   No current facility-administered medications for this visit.    Physical Exam: BP (!) 152/80 (BP Location: Right Arm, Patient Position: Sitting)   Pulse 100   Resp 20   Ht 5\' 3"  (1.6 m)   Wt 172 lb (78 kg)   SpO2 97% Comment: RA  BMI 30.47 kg/m  She looks well. Cardiac exam shows regular rate and rhythm with normal heart sounds.  There is no murmur. Lungs are clear. The chest incision is healing well and the sternum is stable. There is no peripheral edema.  Diagnostic Tests:  Narrative & Impression  CLINICAL DATA:  S/P AVR x 3 weeks ago. No symptoms.   EXAM: CHEST - 2 VIEW   COMPARISON:  06/10/2021   FINDINGS: Lungs are clear.   Heart size and mediastinal contours are within normal limits. Post AVR.   No effusion.   Visualized bones unremarkable.  Sternotomy wires.   IMPRESSION: No acute cardiopulmonary disease.     Electronically Signed   By: 06/12/2021 M.D.   On: 07/01/2021 10:22      Impression:  She is doing very well almost 1 month out from her surgery.  I encouraged her to continue walking as much as possible.  I told her she could return to driving a car but should refrain lifting anything heavier than 10 pounds for 3 months postoperatively.  She is planning to participate in outpatient cardiac rehab and I told her she could start that anytime.  She is still hypertensive after resuming losartan HCT but it is a lower dose that she had been on previously.  She has a follow-up appointment with Dr. 07/03/2021 and he will decide about increasing that further as well as when to discontinue her Eliquis.  Plan:  She will continue to follow-up with Dr. Jacinto Halim and will return to see me if she has any problems with her incision.    Jacinto Halim, MD [facility] [phone]

## 2021-07-05 ENCOUNTER — Other Ambulatory Visit: Payer: Self-pay | Admitting: Surgical

## 2021-07-10 ENCOUNTER — Other Ambulatory Visit: Payer: Self-pay | Admitting: Endocrinology

## 2021-07-10 DIAGNOSIS — E049 Nontoxic goiter, unspecified: Secondary | ICD-10-CM

## 2021-07-13 NOTE — Telephone Encounter (Signed)
From pt

## 2021-07-13 NOTE — Telephone Encounter (Signed)
Schedule her for an ov with me, my schedule is opened up as I am not going out of town starting November 10.  Please give her samples for 2 weeks until then.

## 2021-07-14 ENCOUNTER — Ambulatory Visit: Payer: Medicare Other | Admitting: Cardiology

## 2021-07-15 NOTE — Telephone Encounter (Signed)
Called patient to see if she was interested in participating in the Cardiac Rehab Program. Patient stated yes. Patient will come in for orientation on 07/30/2021@1 :15pm and will attend the 8:30am exercise class.   Mailed package

## 2021-07-20 DIAGNOSIS — E7800 Pure hypercholesterolemia, unspecified: Secondary | ICD-10-CM | POA: Diagnosis not present

## 2021-07-20 DIAGNOSIS — I10 Essential (primary) hypertension: Secondary | ICD-10-CM | POA: Diagnosis not present

## 2021-07-20 DIAGNOSIS — E559 Vitamin D deficiency, unspecified: Secondary | ICD-10-CM | POA: Diagnosis not present

## 2021-07-22 ENCOUNTER — Other Ambulatory Visit: Payer: Medicare Other

## 2021-07-24 ENCOUNTER — Encounter: Payer: Self-pay | Admitting: Cardiology

## 2021-07-24 ENCOUNTER — Other Ambulatory Visit: Payer: Self-pay

## 2021-07-24 ENCOUNTER — Ambulatory Visit: Payer: Medicare Other | Admitting: Cardiology

## 2021-07-24 VITALS — BP 145/81 | HR 77 | Temp 98.0°F | Resp 16 | Ht 63.0 in | Wt 174.8 lb

## 2021-07-24 DIAGNOSIS — Z954 Presence of other heart-valve replacement: Secondary | ICD-10-CM

## 2021-07-24 DIAGNOSIS — Q231 Congenital insufficiency of aortic valve: Secondary | ICD-10-CM | POA: Diagnosis not present

## 2021-07-24 DIAGNOSIS — I10 Essential (primary) hypertension: Secondary | ICD-10-CM | POA: Insufficient documentation

## 2021-07-24 DIAGNOSIS — I6522 Occlusion and stenosis of left carotid artery: Secondary | ICD-10-CM

## 2021-07-24 MED ORDER — ASPIRIN 81 MG PO CHEW: 81.0000 mg | CHEWABLE_TABLET | Freq: Every day | ORAL | Status: DC

## 2021-07-24 MED ORDER — LOSARTAN POTASSIUM-HCTZ 100-25 MG PO TABS: 1.0000 | ORAL_TABLET | ORAL | 3 refills | Status: DC

## 2021-07-24 NOTE — Progress Notes (Signed)
Primary Physician/Referring:  Janie Morning, DO  Patient ID: [name], female    DOB: 12-13-1950, 70 y.o.   MRN: [id number]  Chief Complaint  Patient presents with   Hypertension   Thoracotomy Atrial Fibrillation   Follow-up    6 weeks    HPI:    [name]  is a 70 y.o. Caucasian female with hypertension, diet controlled DM, mild obesity, hyperlipidemia, fibromyalgia and PSVT, bicuspid aortic valve with aortic stenosis.  Patient underwent aortic valve replacement with a bioprosthetic tissue valve on 06/05/2021.  She had postop A. fib.  She presents for 6-week office visit.  She has recuperated very well.  States that prior to aortic valve replacement she could not walk even within the house without having to stop, now she has resumed all her activities and feels well.  She still has chest wall pain but also states that she has not been taking any of the pain medications as her improvement has been steady.  No leg edema, no PND or orthopnea.     Past Medical History:  Diagnosis Date   Anxiety    Aortic regurgitation    Arthritis    Asthma    Depression    Depression    Diabetes mellitus    Dyspnea    Facet joint disease of lumbosacral region    Fibromyalgia    GERD (gastroesophageal reflux disease)    Goiter    Hair loss    Heart murmur    Hypertension    Menopause    Mitral regurgitation    Mitral valve prolapse    Other and unspecified hyperlipidemia    Positive PPD    Sleep apnea    Small bowel obstruction ([place]) 02/09/2018   SVT (supraventricular tachycardia) ([place])    Vasculitis ([facility])    Past Surgical History:  Procedure Laterality Date   ABDOMINAL HYSTERECTOMY     AORTIC VALVE REPLACEMENT N/A 06/05/2021   Procedure: AORTIC VALVE REPLACEMENT (AVR), USING 27MM INSPIRIS RESILIA  AORTIC VALVE;  Surgeon: Gaye Pollack, MD;  Location: [facility];  Service: Open Heart Surgery;  Laterality: N/A;   APPENDECTOMY     CARDIAC CATHETERIZATION      CHOLECYSTECTOMY     DIAGNOSTIC LAPAROSCOPY     EYE SURGERY Bilateral    cataract removal   PARTIAL HYSTERECTOMY     RIGHT/LEFT HEART CATH AND CORONARY ANGIOGRAPHY N/A 04/28/2021   Procedure: RIGHT/LEFT HEART CATH AND CORONARY ANGIOGRAPHY;  Surgeon: Adrian Prows, MD;  Location: [facility];  Service: Cardiovascular;  Laterality: N/A;   TEE WITHOUT CARDIOVERSION N/A 06/05/2021   Procedure: TRANSESOPHAGEAL ECHOCARDIOGRAM (TEE);  Surgeon: Gaye Pollack, MD;  Location: [facility];  Service: Open Heart Surgery;  Laterality: N/A;   Family History  Problem Relation Age of Onset   Hepatitis Mother 81       died from Hep A   Diabetes Father    Cancer Brother    Crohn's disease Brother    Alcohol abuse Brother    Sleep apnea Neg Hx     Social History   Tobacco Use   Smoking status: Former    Packs/day: 0.25    Years: 15.00    Pack years: 3.75    Types: Cigarettes    Quit date: 03/13/1988    Years since quitting: 33.3   Smokeless tobacco: Never  Substance Use Topics   Alcohol use: No   Marital Status: Married  ROS  Review of Systems  Cardiovascular:  Negative for dyspnea on exertion and leg swelling.  Musculoskeletal:  Positive for arthritis and back pain. Negative for myalgias.  Gastrointestinal:  Negative for heartburn and melena.  Objective  Blood pressure (!) 145/81, pulse 77, temperature 98 F (36.7 C), temperature source Temporal, resp. rate 16, height 5' 3"  (1.6 m), weight 174 lb 12.8 oz (79.3 kg), SpO2 96 %.  Vitals with BMI 07/24/2021 07/01/2021 06/22/2021  Height 5' 3"  5' 3"  -  Weight 174 lbs 13 oz 172 lbs -  BMI 08.14 48.18 -  Systolic 563 149 702  Diastolic 81 80 74  Pulse 77 100 80     Physical Exam Constitutional:      General: She is not in acute distress.    Comments: Moderately built and mildly obese.  Most obesity is truncal obesity.  Neck:     Vascular: No carotid bruit or JVD.  Cardiovascular:     Rate and Rhythm: Normal rate and regular rhythm.      Pulses: Normal pulses and intact distal pulses.     Heart sounds: Murmur heard.  Low-pitched early systolic murmur is present with a grade of 1/6 at the upper right sternal border.    No gallop.  Pulmonary:     Effort: Pulmonary effort is normal. No accessory muscle usage or respiratory distress.     Breath sounds: Normal breath sounds.  Abdominal:     General: Bowel sounds are normal.     Palpations: Abdomen is soft.     Comments: Reducible ventral hernia noted.  Obese abdomen.   Musculoskeletal:     Right lower leg: No edema.     Left lower leg: No edema.   Laboratory examination:   Recent Labs    06/11/21 0739 06/12/21 0444 06/13/21 0435  NA 141 137 135  K 3.2* 4.3 4.4  CL 113* 104 101  CO2 21* 24 25  GLUCOSE 100* 133* 169*  BUN 8 11 9   CREATININE 0.60 0.77 0.87  CALCIUM 7.1* 8.8* 9.3  GFRNONAA >60 >60 >60   CrCl cannot be calculated (Patient's most recent lab result is older than the maximum 21 days allowed.).  CMP Latest Ref Rng & Units 06/13/2021 06/12/2021 06/11/2021  Glucose 70 - 99 mg/dL 169(H) 133(H) 100(H)  BUN 8 - 23 mg/dL 9 11 8   Creatinine 0.44 - 1.00 mg/dL 0.87 0.77 0.60  Sodium 135 - 145 mmol/L 135 137 141  Potassium 3.5 - 5.1 mmol/L 4.4 4.3 3.2(L)  Chloride 98 - 111 mmol/L 101 104 113(H)  CO2 22 - 32 mmol/L 25 24 21(L)  Calcium 8.9 - 10.3 mg/dL 9.3 8.8(L) 7.1(L)  Total Protein 6.5 - 8.1 g/dL - - -  Total Bilirubin 0.3 - 1.2 mg/dL - - -  Alkaline Phos 38 - 126 U/L - - -  AST 15 - 41 U/L - - -  ALT 0 - 44 U/L - - -   CBC Latest Ref Rng & Units 06/08/2021 06/07/2021 06/06/2021  WBC 4.0 - 10.5 K/uL 17.3(H) 19.3(H) 22.2(H)  Hemoglobin 12.0 - 15.0 g/dL 10.3(L) 10.4(L) 10.5(L)  Hematocrit 36.0 - 46.0 % 32.2(L) 31.7(L) 33.0(L)  Platelets 150 - 400 K/uL 148(L) 117(L) 138(L)   External labs:   Labs 04/14/2021:  Sodium 142, potassium 4.2, BUN 13, creatinine 0.93, EGFR 71 mL.  Total cholesterol 147, triglycerides 208, HDL 42, LDL 63.  Non-HDL cholesterol  105.  TSH normal at 1.32.  Vitamin D normal at 35.7.  Labs 08/28/2020:  Hb 14.6/HCT  44.9, platelets 229.  Serum glucose 157 mg, BUN 14, creatinine 1.1, EGFR 51.9.  LFT normal.  A1c 7.1%.TSH normal.  Total cholesterol 106, triglycerides 157, HDL 40, LDL 55.   Labs 07/16/2019:  Hb 14.3/HCT 44.1, platelets 266, indicis normal.  Serum glucose 207 mg, BUN 13, creatinine 1.0, EGFR 58 mL.  Potassium 4.8, sodium 141, CMP normal otherwise.  Total cholesterol 126, triglycerides 157, HDL 40, LDL 55.  Non-HDL cholesterol 86.  TSH normal at 0.77.  Medications and allergies   Allergies  Allergen Reactions   Penicillins Anaphylaxis    Has patient had a PCN reaction causing immediate rash, facial/tongue/throat swelling, SOB or lightheadedness with hypotension: No Has patient had a PCN reaction causing severe rash involving mucus membranes or skin necrosis: No Has patient had a PCN reaction that required hospitalization: Yes/ Has patient had a PCN reaction occurring within the last 10 years: No If all of the above answers are "NO", then may proceed with Cephalosporin use.    Ace Inhibitors     INDUCED BRONCHOSPASM   Contrast Media [Iodinated Diagnostic Agents]      Current Outpatient Medications  Medication Instructions   acetaminophen (TYLENOL) 500 mg, Oral, Every 6 hours PRN   albuterol (PROVENTIL HFA;VENTOLIN HFA) 108 (90 Base) MCG/ACT inhaler 2 puffs, Inhalation, Every 6 hours PRN   albuterol (PROVENTIL) 2.5 mg, Nebulization, Every 6 hours PRN, ICD10:J45.909   ALPRAZolam (XANAX) 1 MG tablet TAKE ONE-HALF TABLET BY MOUTH ONCE DAILY AS NEEDED FOR STRESS   aspirin (ASPIRIN CHILDRENS) 81 mg, Oral, Daily   atorvastatin (LIPITOR) 40 mg, Oral, Daily   augmented betamethasone dipropionate (DIPROLENE-AF) 1.51 % cream 1 application, Topical, 2 times daily PRN   B-12 5,000 mcg, Oral, Daily   Biotin 10,000 mcg, Oral, Daily   carboxymethylcellulose (REFRESH PLUS) 0.5 % SOLN 1 drop, Both  Eyes, 3 times daily PRN   diltiazem (CARDIZEM CD) 240 mg, Oral, Daily   Glucose 15-30 g, Oral, Daily PRN   insulin aspart (NOVOLOG) 100 UNIT/ML injection Subcutaneous, Continuous, Uses in pump basal rate 3.0 units per hour, bolus 1 unit per 30 carbs   L-Lysine 500 mg, Oral, Daily   levocetirizine (XYZAL ALLERGY 24HR) 5 mg, Oral, Every evening   levothyroxine (SYNTHROID) 125 mcg, Oral, Daily before breakfast   losartan-hydrochlorothiazide (HYZAAR) 100-25 MG tablet 1 tablet, Oral, BH-each morning   Magnesium 500 mg, Oral, Daily at bedtime   Melatonin 10 mg, Oral, Daily at bedtime   omeprazole (PRILOSEC) 40 mg, Oral, Daily   OVER THE COUNTER MEDICATION 4 tablets, Oral, Daily at bedtime, Focus factor otc supplement   Probiotic Product (PROBIOTIC PO) 1 capsule, Oral, Daily, 20 billion active cultures   rOPINIRole (REQUIP) 0.5 mg, Oral, Daily at bedtime   sertraline (ZOLOFT) 100 mg, Oral, Daily   simethicone (GAS-X) 80 mg, Oral, 4 times daily PRN   Vitamin D 1,000 Units, Oral, Daily   Radiology:   No results found.  Cardiac Studies:   Exercise myoview stress 09/02/2014: 1. The resting electrocardiogram demonstrated normal sinus rhythm, normal resting conduction, no resting arrhythmias and nonspecific ST-T changes. The stress electrocardiogram was normal. The patient performed treadmill exercise using a Bruce protocol, completing 6:45 minutes. The patient completed an estimated workload of 10.2 METS. The blood pressure response to exercise was normal. The stress test was terminated because of fatigue. 2. The overall quality of the study is good. There is no evidence of abnormal lung activity. Stress and rest SPECT images demonstrate homogeneous tracer distribution throughout  the myocardium. Gated SPECT imaging reveals normal myocardial thickening and wall motion. The left ventricular ejection fraction was normal (44%) but visually appears to be normal. This is a low risk study. No significant  change from Exercise sestamibi stress test on 05/08/2009.  Sleep study 10/17/2014 (Dohmeier, MD): Mild to moderate obstructive sleep apnea. Effectively treated with CPAP. Compliant  PCV ECHOCARDIOGRAM COMPLETE 03/25/2021 Left ventricle cavity is normal in size. Moderate concentric hypertrophy of the left ventricle. Normal global wall motion. Normal LV systolic function with visual EF 55-60%. Doppler evidence of grade II (pseudonormal) diastolic dysfunction, elevated LAP. Bicuspid aortic valve with raphe between left and right coronary cusp. Moderate calcification. Severe aortic stenosis. Vmax 4.0 m/sec. Aortic valve peak gradient of 66.6 mmHg and mean gradient of 41.0 mmHg, aortic valve area 0.6 cm by continuity equation. Mild aortic regurgitation. Mild (Grade I) mitral regurgitation. Mild tricuspid regurgitation. No evidence of pulmonary hypertension. Compared to previous studies in 2020 and 2021, aortic stenosis severity is increased from moderate.  Carotid artery duplex 03/25/2021: No evidence of significant stenosis in the right carotid vessels. Duplex suggests stenosis in the left internal carotid artery (50-69%). Antegrade right vertebral artery flow. Antegrade left vertebral artery flow. Compared to the study done on 05/10/2017, left carotid stenosis is new.  Follow up in six months is appropriate if clinically indicated.  Right and left heart catheterization 04/28/2021: Normal coronary arteries, left dominant circulation. LV: 171/8, EDP 17.  Ao 104/46, mean 67 mmHg.  Peak to peak pressure gradient 50 and mean pressure gradient across the aortic valve of 44.6 mmHg.  Calculated aortic valve area 0.89 cm.  QP/QS 1.00. RA 16/13, mean 13; RV 35/1, EDP 9; PA 26/9, mean 14.  PA saturation 74%.  PW 23/24, mean 22 mmHg. CO 5.17, CI 2.83.  Normal.  EKG  EKG 07/24/2021: Normal sinus rhythm at rate of 80 bpm, leftward axis, incomplete right bundle branch block.  Nonspecific T abnormality.  Normal  QT interval.  No significant change from EKG 06/22/2021: Sinus rhythm with first-degree block at rate of 71 bpm, left atrial enlargement, left axis deviation.  LVH with repolarization abnormality, cannot exclude lateral ischemia.      Assessment     ICD-10-CM   1. H/O aortic valve replacement with tissue graft 27MM INSPIRIS RESILIA  AORTIC VALVE 05/31/2021  Z95.4 EKG 12-Lead    2. Primary hypertension  I10 losartan-hydrochlorothiazide (HYZAAR) 100-25 MG tablet    3. Asymptomatic stenosis of left carotid artery  I65.22 aspirin (ASPIRIN CHILDRENS) 81 MG chewable tablet    4. Bicuspid aortic valve  Q23.1     5. Essential hypertension  I10       Meds ordered this encounter  Medications   aspirin (ASPIRIN CHILDRENS) 81 MG chewable tablet    Sig: Chew 1 tablet (81 mg total) by mouth daily.   losartan-hydrochlorothiazide (HYZAAR) 100-25 MG tablet    Sig: Take 1 tablet by mouth every morning.    Dispense:  90 tablet    Refill:  3    Please do not fill until patient calls for a refill      Medications Discontinued During This Encounter  Medication Reason   traMADol (ULTRAM) 50 MG tablet Error   apixaban (ELIQUIS) 5 MG TABS tablet Completed Course   losartan-hydrochlorothiazide (HYZAAR) 50-12.5 MG tablet Dose change   Recommendations:   [name]  is a 70 y.o. Caucasian female with hypertension, diet controlled DM, mild obesity, hyperlipidemia, fibromyalgia and PSVT, bicuspid aortic valve with  aortic stenosis.  Patient underwent aortic valve replacement with a bioprosthetic tissue valve on 06/05/2021. On the fourth day of hospitalization, patient did have A. fib with RVR, she was discharged home on digoxin along with amiodarone.  I seen her 6 weeks ago, she was in sinus rhythm and I discontinued amiodarone.  Today the repeat EKG reveals persistence of sinus rhythm, I will discontinue Eliquis and restart aspirin 81 mg daily.  Her dyspnea has improved significantly since her aortic  valve replacement at last office visit, she is essentially back to baseline almost and will be starting cardiac rehab soon.  She still has mild postoperative chest pain.  No clinical evidence of heart failure.  Heart sounds are excellent.    I will see her back in3 months or sooner if there is any other problems.  She is scheduled for baseline echocardiogram next week and carotid artery surveillance to be done sometime in January.  Her blood pressure has been elevated, previously she was on losartan HCT 100/25 mg in the morning, I will reinitiate this dose.  She is fairly good at monitoring her blood pressure at home and she will let us know if blood pressure remains >130/80 mmHg.  CC: Hassan Buckler, MD  Adrian Prows, MD, [facility] 07/24/2021, 10:29 AM Office: [phone] Fax: [phone] Pager: [phone]

## 2021-07-27 ENCOUNTER — Other Ambulatory Visit: Payer: Medicare Other

## 2021-07-27 DIAGNOSIS — Z23 Encounter for immunization: Secondary | ICD-10-CM | POA: Diagnosis not present

## 2021-07-27 DIAGNOSIS — Z954 Presence of other heart-valve replacement: Secondary | ICD-10-CM | POA: Diagnosis not present

## 2021-07-27 DIAGNOSIS — Z0000 Encounter for general adult medical examination without abnormal findings: Secondary | ICD-10-CM | POA: Diagnosis not present

## 2021-07-27 DIAGNOSIS — G2581 Restless legs syndrome: Secondary | ICD-10-CM | POA: Diagnosis not present

## 2021-07-27 DIAGNOSIS — E559 Vitamin D deficiency, unspecified: Secondary | ICD-10-CM | POA: Diagnosis not present

## 2021-07-27 DIAGNOSIS — K219 Gastro-esophageal reflux disease without esophagitis: Secondary | ICD-10-CM | POA: Diagnosis not present

## 2021-07-27 DIAGNOSIS — F419 Anxiety disorder, unspecified: Secondary | ICD-10-CM | POA: Diagnosis not present

## 2021-07-27 DIAGNOSIS — I10 Essential (primary) hypertension: Secondary | ICD-10-CM | POA: Diagnosis not present

## 2021-07-27 DIAGNOSIS — E7800 Pure hypercholesterolemia, unspecified: Secondary | ICD-10-CM | POA: Diagnosis not present

## 2021-07-27 DIAGNOSIS — F3340 Major depressive disorder, recurrent, in remission, unspecified: Secondary | ICD-10-CM | POA: Diagnosis not present

## 2021-07-28 ENCOUNTER — Ambulatory Visit
Admission: RE | Admit: 2021-07-28 | Discharge: 2021-07-28 | Disposition: A | Discharge status: Home / Self Care | Payer: Medicare Other | Source: Ambulatory Visit | Attending: Endocrinology | Admitting: Endocrinology

## 2021-07-28 DIAGNOSIS — E049 Nontoxic goiter, unspecified: Secondary | ICD-10-CM | POA: Diagnosis not present

## 2021-07-29 ENCOUNTER — Ambulatory Visit: Payer: Medicare Other

## 2021-07-29 ENCOUNTER — Other Ambulatory Visit: Payer: Self-pay

## 2021-07-29 ENCOUNTER — Telehealth (HOSPITAL_COMMUNITY): Payer: Self-pay

## 2021-07-29 DIAGNOSIS — I359 Nonrheumatic aortic valve disorder, unspecified: Secondary | ICD-10-CM | POA: Diagnosis not present

## 2021-07-29 DIAGNOSIS — Z954 Presence of other heart-valve replacement: Secondary | ICD-10-CM

## 2021-07-29 MED ORDER — DILTIAZEM HCL ER COATED BEADS 240 MG PO CP24: 240.0000 mg | ORAL_CAPSULE | Freq: Every day | ORAL | 1 refills | Status: DC

## 2021-07-30 ENCOUNTER — Encounter (HOSPITAL_COMMUNITY)
Admission: RE | Admit: 2021-07-30 | Discharge: 2021-07-30 | Disposition: A | Discharge status: Home / Self Care | Payer: Medicare Other | Source: Ambulatory Visit | Attending: Cardiology | Admitting: Cardiology

## 2021-07-30 ENCOUNTER — Other Ambulatory Visit: Payer: Medicare Other

## 2021-07-30 ENCOUNTER — Encounter (HOSPITAL_COMMUNITY): Payer: Self-pay

## 2021-07-30 VITALS — BP 108/72 | HR 99 | Ht 63.0 in | Wt 170.9 lb

## 2021-07-30 DIAGNOSIS — Z9641 Presence of insulin pump (external) (internal): Secondary | ICD-10-CM | POA: Diagnosis not present

## 2021-07-30 DIAGNOSIS — E559 Vitamin D deficiency, unspecified: Secondary | ICD-10-CM | POA: Diagnosis not present

## 2021-07-30 DIAGNOSIS — E049 Nontoxic goiter, unspecified: Secondary | ICD-10-CM | POA: Diagnosis not present

## 2021-07-30 DIAGNOSIS — E1065 Type 1 diabetes mellitus with hyperglycemia: Secondary | ICD-10-CM | POA: Diagnosis not present

## 2021-07-30 DIAGNOSIS — I10 Essential (primary) hypertension: Secondary | ICD-10-CM | POA: Diagnosis not present

## 2021-07-30 DIAGNOSIS — Z953 Presence of xenogenic heart valve: Secondary | ICD-10-CM | POA: Insufficient documentation

## 2021-07-30 DIAGNOSIS — E7800 Pure hypercholesterolemia, unspecified: Secondary | ICD-10-CM | POA: Diagnosis not present

## 2021-07-30 DIAGNOSIS — E039 Hypothyroidism, unspecified: Secondary | ICD-10-CM | POA: Diagnosis not present

## 2021-07-30 DIAGNOSIS — E119 Type 2 diabetes mellitus without complications: Secondary | ICD-10-CM | POA: Insufficient documentation

## 2021-07-30 NOTE — Progress Notes (Signed)
Cardiac Rehab Medication Review by a Nurse  Does the patient  feel that his/her medications are working for him/her?  yes  Has the patient been experiencing any side effects to the medications prescribed?  no  Does the patient measure his/her own blood pressure or blood glucose at home?  yes   Does the patient have any problems obtaining medications due to transportation or finances?   no  Understanding of regimen: excellent Understanding of indications: excellent Potential of compliance: excellent    Nurse comments: Stuti is taking her medications as prescribed and has a good understanding of what her medications are for. Torri has a insulin pump and a CGM. Nilaya checks her blood pressure and oxygen saturations on a daily basis and keeps a log.    Thayer Headings RN BSN 07/30/2021 1:37 PM

## 2021-07-30 NOTE — Progress Notes (Signed)
Cardiac Individual Treatment Plan  Patient Details  Name: [name] MRN: [id number] Date of Birth: February 14, 1951 Referring Provider:   Flowsheet Row CARDIAC REHAB PHASE II ORIENTATION from 07/30/2021 in [facility] CARDIAC REHAB  Referring Provider Yates Decamp, MD       Initial Encounter Date:  Flowsheet Row CARDIAC REHAB PHASE II ORIENTATION from 07/30/2021 in [facility] CARDIAC REHAB  Date 07/30/21       Visit Diagnosis: 06/05/21 S/P Aortic Valve Replacement  Patient's Home Medications on Admission:  Current Outpatient Medications:    acetaminophen (TYLENOL) 500 MG tablet, Take 500 mg by mouth every 6 (six) hours as needed for headache., Disp: , Rfl:    albuterol (PROVENTIL HFA;VENTOLIN HFA) 108 (90 Base) MCG/ACT inhaler, Inhale 2 puffs into the lungs every 6 (six) hours as needed. (Patient taking differently: Inhale 2 puffs into the lungs every 6 (six) hours as needed (Asthma).), Disp: 8.5 g, Rfl: 11   albuterol (PROVENTIL) (2.5 MG/3ML) 0.083% nebulizer solution, Take 3 mLs (2.5 mg total) by nebulization every 6 (six) hours as needed. [id number] (Patient taking differently: Take 2.5 mg by nebulization every 6 (six) hours as needed (Asthma). [id number]), Disp: 75 mL, Rfl: 1   ALPRAZolam (XANAX) 1 MG tablet, TAKE ONE-HALF TABLET BY MOUTH ONCE DAILY AS NEEDED FOR STRESS (Patient taking differently: Take 0.5-1 mg by mouth daily as needed for anxiety.), Disp: 90 tablet, Rfl: 0   aspirin (ASPIRIN CHILDRENS) 81 MG chewable tablet, Chew 1 tablet (81 mg total) by mouth daily., Disp: , Rfl:    atorvastatin (LIPITOR) 40 MG tablet, Take 1 tablet (40 mg total) daily by mouth., Disp: 90 tablet, Rfl: 3   augmented betamethasone dipropionate (DIPROLENE-AF) 0.05 % cream, Apply 1 application topically 2 (two) times daily as needed (eczema)., Disp: , Rfl:    Biotin 32951 MCG TABS, Take 10,000 mcg by mouth daily., Disp: , Rfl:    carboxymethylcellulose  (REFRESH PLUS) 0.5 % SOLN, Place 1 drop into both eyes 3 (three) times daily as needed (dry eyes)., Disp: , Rfl:    Cholecalciferol (VITAMIN D) 50 MCG (2000 UT) CAPS, Take 0.5 capsules (1,000 Units total) by mouth daily. (Patient taking differently: Take 2,000 Units by mouth daily.), Disp: , Rfl:    Cyanocobalamin (B-12) 5000 MCG CAPS, Take 5,000 mcg by mouth daily., Disp: , Rfl:    Glucose 15 g PACK, Take 15-30 g by mouth daily as needed (low blood sugar)., Disp: , Rfl:    insulin aspart (NOVOLOG) 100 UNIT/ML injection, Inject into the skin continuous. Via Insulin Pump Uses in pump basal rate 3.0 units per hour, bolus 1 unit per 30 carbs, Disp: , Rfl:    L-Lysine 500 MG CAPS, Take 500 mg by mouth daily., Disp: , Rfl:    levocetirizine (XYZAL ALLERGY 24HR) 5 MG tablet, Take 1 tablet (5 mg total) by mouth every evening., Disp: 30 tablet, Rfl: 0   levothyroxine (SYNTHROID) 125 MCG tablet, Take 125 mcg by mouth daily before breakfast., Disp: , Rfl:    losartan-hydrochlorothiazide (HYZAAR) 100-25 MG tablet, Take 1 tablet by mouth every morning., Disp: 90 tablet, Rfl: 3   Magnesium 500 MG TABS, Take 500 mg by mouth at bedtime., Disp: , Rfl:    Melatonin 10 MG TABS, Take 10 mg by mouth at bedtime., Disp: , Rfl:    omeprazole (PRILOSEC) 40 MG capsule, Take 40 mg by mouth daily., Disp: , Rfl:    OVER THE COUNTER MEDICATION, Take 4 tablets by  mouth at bedtime. Focus factor otc supplement, Disp: , Rfl:    Probiotic Product (PROBIOTIC PO), Take 1 capsule by mouth daily. 20 billion active cultures, Disp: , Rfl:    rOPINIRole (REQUIP) 0.5 MG tablet, Take 0.5 mg by mouth at bedtime., Disp: , Rfl:    sertraline (ZOLOFT) 100 MG tablet, Take 100 mg by mouth daily., Disp: , Rfl:    simethicone (GAS-X) 80 MG chewable tablet, Chew 1 tablet (80 mg total) by mouth 4 (four) times daily as needed for flatulence., Disp: 100 tablet, Rfl: 2   diltiazem (CARDIZEM CD) 240 MG 24 hr capsule, Take 1 capsule (240 mg total) by mouth  daily., Disp: 90 capsule, Rfl: 1  Past Medical History: Past Medical History:  Diagnosis Date   Anxiety    Aortic regurgitation    Arthritis    Asthma    Depression    Depression    Diabetes mellitus    Dyspnea    Facet joint disease of lumbosacral region    Fibromyalgia    GERD (gastroesophageal reflux disease)    Goiter    Hair loss    Heart murmur    Hypertension    Menopause    Mitral regurgitation    Mitral valve prolapse    Other and unspecified hyperlipidemia    Positive PPD    Sleep apnea    Small bowel obstruction ([place]) 02/09/2018   SVT (supraventricular tachycardia) ([place])    Vasculitis ([facility])     Tobacco Use: Social History   Tobacco Use  Smoking Status Former   Packs/day: 0.25   Years: 15.00   Pack years: 3.75   Types: Cigarettes   Quit date: 03/13/1988   Years since quitting: 33.4  Smokeless Tobacco Never    Labs: Recent Review Flowsheet Data     Labs for ITP Cardiac and Pulmonary Rehab Latest Ref Rng & Units 06/05/2021 06/05/2021 06/05/2021 06/05/2021 06/05/2021   Cholestrol <200 mg/dL - - - - -   LDLCALC mg/dL (calc) - - - - -   HDL >50 mg/dL - - - - -   Trlycerides <150 mg/dL - - - - -   Hemoglobin A1c 4.8 - 5.6 % - - - - -   PHART 7.350 - 7.450 7.397 - 7.362 7.297(L) 7.306(L)   PCO2ART 32.0 - 48.0 mmHg 41.1 - 39.5 49.5(H) 55.0(H)   HCO3 20.0 - 28.0 mmol/L 25.3 - 22.8 24.3 27.4   TCO2 22 - 32 mmol/L 26 26 24 26 29    ACIDBASEDEF 0.0 - 2.0 mmol/L - - 3.0(H) 3.0(H) -   O2SAT % 99.0 - 96.0 92.0 92.0       Capillary Blood Glucose: Lab Results  Component Value Date   GLUCAP 167 (H) 06/13/2021   GLUCAP 195 (H) 06/12/2021   GLUCAP 169 (H) 06/12/2021   GLUCAP 228 (H) 06/12/2021   GLUCAP 163 (H) 06/12/2021     Exercise Target Goals: Exercise Program Goal: Individual exercise prescription set using results from initial 6 min walk test and THRR while considering  patient's activity barriers and safety.   Exercise Prescription Goal: Starting  with aerobic activity 30 plus minutes a day, 3 days per week for initial exercise prescription. Provide home exercise prescription and guidelines that participant acknowledges understanding prior to discharge.  Activity Barriers & Risk Stratification:  Activity Barriers & Cardiac Risk Stratification - 07/30/21 1437       Activity Barriers & Cardiac Risk Stratification   Activity Barriers Back Problems;Balance Concerns  Cardiac Risk Stratification High             6 Minute Walk:  6 Minute Walk     Row Name 07/30/21 1412         6 Minute Walk   Phase Initial     Distance 1435 feet     Walk Time 6 minutes     # of Rest Breaks 0     MPH 2.72     METS 3.38     RPE 9     Perceived Dyspnea  0     VO2 Peak 11.81     Symptoms No     Resting HR 99 bpm     Resting BP 108/72     Resting Oxygen Saturation  96 %     Exercise Oxygen Saturation  during 6 min walk 95 %     Max Ex. HR 125 bpm     Max Ex. BP 160/64     2 Minute Post BP 134/60              Oxygen Initial Assessment:   Oxygen Re-Evaluation:   Oxygen Discharge (Final Oxygen Re-Evaluation):   Initial Exercise Prescription:  Initial Exercise Prescription - 07/30/21 1400       Date of Initial Exercise RX and Referring Provider   Date 07/30/21    Referring Provider Adrian Prows, MD    Expected Discharge Date 09/25/21      NuStep   Level 2    SPM 75    Minutes 30    METs 2.2      Prescription Details   Frequency (times per week) 3    Duration Progress to 30 minutes of continuous aerobic without signs/symptoms of physical distress      Intensity   THRR 40-80% of Max Heartrate 60-119    Ratings of Perceived Exertion 11-13    Perceived Dyspnea 0-4      Progression   Progression Continue progressive overload as per policy without signs/symptoms or physical distress.      Resistance Training   Training Prescription Yes    Weight 2 lbs    Reps 10-15             Perform Capillary Blood  Glucose checks as needed.  Exercise Prescription Changes:   Exercise Comments:   Exercise Goals and Review:   Exercise Goals     Row Name 07/30/21 1437             Exercise Goals   Increase Physical Activity Yes       Intervention Provide advice, education, support and counseling about physical activity/exercise needs.;Develop an individualized exercise prescription for aerobic and resistive training based on initial evaluation findings, risk stratification, comorbidities and participant's personal goals.       Expected Outcomes Short Term: Attend rehab on a regular basis to increase amount of physical activity.;Long Term: Add in home exercise to make exercise part of routine and to increase amount of physical activity.;Long Term: Exercising regularly at least 3-5 days a week.       Increase Strength and Stamina Yes       Intervention Provide advice, education, support and counseling about physical activity/exercise needs.;Develop an individualized exercise prescription for aerobic and resistive training based on initial evaluation findings, risk stratification, comorbidities and participant's personal goals.       Expected Outcomes Short Term: Increase workloads from initial exercise prescription for resistance, speed, and METs.;Short Term: Perform resistance training exercises  routinely during rehab and add in resistance training at home;Long Term: Improve cardiorespiratory fitness, muscular endurance and strength as measured by increased METs and functional capacity (6MWT)       Able to understand and use rate of perceived exertion (RPE) scale Yes       Intervention Provide education and explanation on how to use RPE scale       Expected Outcomes Short Term: Able to use RPE daily in rehab to express subjective intensity level;Long Term:  Able to use RPE to guide intensity level when exercising independently       Knowledge and understanding of Target Heart Rate Range (THRR) Yes        Intervention Provide education and explanation of THRR including how the numbers were predicted and where they are located for reference       Expected Outcomes Short Term: Able to state/look up THRR;Short Term: Able to use daily as guideline for intensity in rehab;Long Term: Able to use THRR to govern intensity when exercising independently       Understanding of Exercise Prescription Yes       Intervention Provide education, explanation, and written materials on patient's individual exercise prescription       Expected Outcomes Short Term: Able to explain program exercise prescription;Long Term: Able to explain home exercise prescription to exercise independently                Exercise Goals Re-Evaluation :    Discharge Exercise Prescription (Final Exercise Prescription Changes):   Nutrition:  Target Goals: Understanding of nutrition guidelines, daily intake of sodium 1500mg , cholesterol 200mg , calories 30% from fat and 7% or less from saturated fats, daily to have 5 or more servings of fruits and vegetables.  Biometrics:  Pre Biometrics - 07/30/21 1413       Pre Biometrics   Waist Circumference 39.5 inches    Hip Circumference 44 inches    Waist to Hip Ratio 0.9 %    Triceps Skinfold 28 mm    % Body Fat 42.4 %    Grip Strength 20 kg    Flexibility 12.25 in    Single Leg Stand 6.62 seconds              Nutrition Therapy Plan and Nutrition Goals:   Nutrition Assessments:  MEDIFICTS Score Key: ?70 Need to make dietary changes  40-70 Heart Healthy Diet ? 40 Therapeutic Level Cholesterol Diet   Picture Your Plate Scores: [id number] Unhealthy dietary pattern with much room for improvement. 41-50 Dietary pattern unlikely to meet recommendations for good health and room for improvement. 51-60 More healthful dietary pattern, with some room for improvement.  >60 Healthy dietary pattern, although there may be some specific behaviors that could be improved.     Nutrition Goals Re-Evaluation:   Nutrition Goals Discharge (Final Nutrition Goals Re-Evaluation):   Psychosocial: Target Goals: Acknowledge presence or absence of significant depression and/or stress, maximize coping skills, provide positive support system. Participant is able to verbalize types and ability to use techniques and skills needed for reducing stress and depression.  Initial Review & Psychosocial Screening:  Initial Psych Review & Screening - 07/30/21 1446       Initial Review   Current issues with History of Depression   History of Anxiety     Family Dynamics   Good Support System? Yes   [name] has her husband and her church family for support   Comments [name] has a history of depression and is  taking an antidepressant. [name] says the her depression is currently controlled at this time.      Barriers   Psychosocial barriers to participate in program There are no identifiable barriers or psychosocial needs.      Screening Interventions   Interventions Encouraged to exercise             Quality of Life Scores:  Quality of Life - 07/30/21 1429       Quality of Life   Select Quality of Life      Quality of Life Scores   Health/Function Pre 24.4 %    Socioeconomic Pre 26.64 %    Psych/Spiritual Pre 17.14 %    Family Pre 28.8 %    GLOBAL Pre 24.01 %            Scores of 19 and below usually indicate a poorer quality of life in these areas.  A difference of  2-3 points is a clinically meaningful difference.  A difference of 2-3 points in the total score of the Quality of Life Index has been associated with significant improvement in overall quality of life, self-image, physical symptoms, and general health in studies assessing change in quality of life.  PHQ-9: Recent Review Flowsheet Data     Depression screen [facility] 2/9 07/30/2021 06/30/2017 03/04/2016 08/28/2015 03/25/2015   Decreased Interest 0 1 3 2  0   Down, Depressed, Hopeless 0 2 - 0 0    PHQ - 2 Score 0 3 3 2  0   Altered sleeping - - 2 2 -   Tired, decreased energy - 3 3 3  -   Change in appetite - 3 3 3  -   Feeling bad or failure about yourself  - 3 2 3  -   Trouble concentrating - 2 0 0 -   Moving slowly or fidgety/restless - 2 0 0 -   Suicidal thoughts - 0 0 0 -   PHQ-9 Score - - 13 13 -   Difficult doing work/chores - Somewhat difficult - - -      Interpretation of Total Score  Total Score Depression Severity:  1-4 = Minimal depression, 5-9 = Mild depression, 10-14 = Moderate depression, 15-19 = Moderately severe depression, 20-27 = Severe depression   Psychosocial Evaluation and Intervention:   Psychosocial Re-Evaluation:   Psychosocial Discharge (Final Psychosocial Re-Evaluation):   Vocational Rehabilitation: Provide vocational rehab assistance to qualifying candidates.   Vocational Rehab Evaluation & Intervention:  Vocational Rehab - 07/30/21 1451       Initial Vocational Rehab Evaluation & Intervention   Assessment shows need for Vocational Rehabilitation No   [name] is retired and does not need vocational rehab at this time.            Education: Education Goals: Education classes will be provided on a weekly basis, covering required topics. Participant will state understanding/return demonstration of topics presented.  Learning Barriers/Preferences:  Learning Barriers/Preferences - 07/30/21 1431       Learning Barriers/Preferences   Learning Barriers Sight   wears glasses   Learning Preferences Audio;Written Material;Computer/Internet;Group Instruction;Individual Instruction;Pictoral;Skilled Demonstration;Verbal Instruction;Video             Education Topics: Hypertension, Hypertension Reduction -Define heart disease and high blood pressure. Discus how high blood pressure affects the body and ways to reduce high blood pressure.   Exercise and Your Heart -Discuss why it is important to exercise, the FITT principles of exercise,  normal and abnormal responses to exercise, and how to  exercise safely.   Angina -Discuss definition of angina, causes of angina, treatment of angina, and how to decrease risk of having angina.   Cardiac Medications -Review what the following cardiac medications are used for, how they affect the body, and side effects that may occur when taking the medications.  Medications include Aspirin, Beta blockers, calcium channel blockers, ACE Inhibitors, angiotensin receptor blockers, diuretics, digoxin, and antihyperlipidemics.   Congestive Heart Failure -Discuss the definition of CHF, how to live with CHF, the signs and symptoms of CHF, and how keep track of weight and sodium intake.   Heart Disease and Intimacy -Discus the effect sexual activity has on the heart, how changes occur during intimacy as we age, and safety during sexual activity.   Smoking Cessation / COPD -Discuss different methods to quit smoking, the health benefits of quitting smoking, and the definition of COPD.   Nutrition I: Fats -Discuss the types of cholesterol, what cholesterol does to the heart, and how cholesterol levels can be controlled.   Nutrition II: Labels -Discuss the different components of food labels and how to read food label   Heart Parts/Heart Disease and PAD -Discuss the anatomy of the heart, the pathway of blood circulation through the heart, and these are affected by heart disease.   Stress I: Signs and Symptoms -Discuss the causes of stress, how stress may lead to anxiety and depression, and ways to limit stress.   Stress II: Relaxation -Discuss different types of relaxation techniques to limit stress.   Warning Signs of Stroke / TIA -Discuss definition of a stroke, what the signs and symptoms are of a stroke, and how to identify when someone is having stroke.   Knowledge Questionnaire Score:  Knowledge Questionnaire Score - 07/30/21 1421       Knowledge Questionnaire Score   Pre  Score 21/24             Core Components/Risk Factors/Patient Goals at Admission:  Personal Goals and Risk Factors at Admission - 07/30/21 1429       Core Components/Risk Factors/Patient Goals on Admission    Weight Management Yes;Obesity;Weight Loss    Intervention Weight Management: Develop a combined nutrition and exercise program designed to reach desired caloric intake, while maintaining appropriate intake of nutrient and fiber, sodium and fats, and appropriate energy expenditure required for the weight goal.;Weight Management: Provide education and appropriate resources to help participant work on and attain dietary goals.;Weight Management/Obesity: Establish reasonable short term and long term weight goals.;Obesity: Provide education and appropriate resources to help participant work on and attain dietary goals.    Admit Weight 170 lb 13.7 oz (77.5 kg)    Expected Outcomes Short Term: Continue to assess and modify interventions until short term weight is achieved;Long Term: Adherence to nutrition and physical activity/exercise program aimed toward attainment of established weight goal;Weight Maintenance: Understanding of the daily nutrition guidelines, which includes 25-35% calories from fat, 7% or less cal from saturated fats, less than 200mg  cholesterol, less than 1.5gm of sodium, & 5 or more servings of fruits and vegetables daily;Weight Loss: Understanding of general recommendations for a balanced deficit meal plan, which promotes 1-2 lb weight loss per week and includes a negative energy balance of [phone] kcal/d;Understanding recommendations for meals to include 15-35% energy as protein, 25-35% energy from fat, 35-60% energy from carbohydrates, less than 200mg  of dietary cholesterol, 20-35 gm of total fiber daily;Understanding of distribution of calorie intake throughout the day with the consumption of 4-5 meals/snacks  Diabetes Yes    Intervention Provide education about  signs/symptoms and action to take for hypo/hyperglycemia.;Provide education about proper nutrition, including hydration, and aerobic/resistive exercise prescription along with prescribed medications to achieve blood glucose in normal ranges: Fasting glucose 65-99 mg/dL    Expected Outcomes Short Term: Participant verbalizes understanding of the signs/symptoms and immediate care of hyper/hypoglycemia, proper foot care and importance of medication, aerobic/resistive exercise and nutrition plan for blood glucose control.;Long Term: Attainment of HbA1C < 7%.    Hypertension Yes    Intervention Provide education on lifestyle modifcations including regular physical activity/exercise, weight management, moderate sodium restriction and increased consumption of fresh fruit, vegetables, and low fat dairy, alcohol moderation, and smoking cessation.;Monitor prescription use compliance.    Expected Outcomes Short Term: Continued assessment and intervention until BP is < 140/45mm HG in hypertensive participants. < 130/80mm HG in hypertensive participants with diabetes, heart failure or chronic kidney disease.;Long Term: Maintenance of blood pressure at goal levels.    Lipids Yes    Intervention Provide education and support for participant on nutrition & aerobic/resistive exercise along with prescribed medications to achieve LDL 70mg , HDL >40mg .    Expected Outcomes Short Term: Participant states understanding of desired cholesterol values and is compliant with medications prescribed. Participant is following exercise prescription and nutrition guidelines.;Long Term: Cholesterol controlled with medications as prescribed, with individualized exercise RX and with personalized nutrition plan. Value goals: LDL < 70mg , HDL > 40 mg.             Core Components/Risk Factors/Patient Goals Review:    Core Components/Risk Factors/Patient Goals at Discharge (Final Review):    ITP Comments:  ITP Comments     Row Name  07/30/21 1444           ITP Comments Dr Fransico Him MD, Medical Director                Comments: [name] attended orientation on 07/30/2021 to review rules and guidelines for program.  Completed 6 minute walk test, Intitial ITP, and exercise prescription.  Max BP noted at 160/60 post 6 minute walk test. Exit BP 134/60 will continue to monitor BP.  Telemetry-Sinus Rhythm, Sinus Tach.  Asymptomatic. Safety measures and social distancing in place per CDC guidelines.Barnet Pall, RN,BSN 07/30/2021 3:05 PM

## 2021-08-03 ENCOUNTER — Other Ambulatory Visit: Payer: Self-pay | Admitting: Surgical

## 2021-08-03 ENCOUNTER — Ambulatory Visit: Payer: Medicare Other | Admitting: Cardiology

## 2021-08-05 ENCOUNTER — Encounter (HOSPITAL_COMMUNITY)
Admission: RE | Admit: 2021-08-05 | Discharge: 2021-08-05 | Disposition: A | Discharge status: Home / Self Care | Payer: Medicare Other | Source: Ambulatory Visit | Attending: Cardiology | Admitting: Cardiology

## 2021-08-05 ENCOUNTER — Other Ambulatory Visit: Payer: Self-pay

## 2021-08-05 DIAGNOSIS — Z953 Presence of xenogenic heart valve: Secondary | ICD-10-CM | POA: Diagnosis not present

## 2021-08-05 DIAGNOSIS — E119 Type 2 diabetes mellitus without complications: Secondary | ICD-10-CM | POA: Diagnosis not present

## 2021-08-05 LAB — GLUCOSE, CAPILLARY
Glucose-Capillary: 212 mg/dL — ABNORMAL HIGH (ref 70–99)
Glucose-Capillary: 156 mg/dL — ABNORMAL HIGH (ref 70–99)

## 2021-08-05 NOTE — Progress Notes (Signed)
Daily Session Note  Patient Details  Name: [name] MRN: [id number] Date of Birth: 14-Apr-1951 Referring Provider:   Flowsheet Row CARDIAC REHAB PHASE II ORIENTATION from 07/30/2021 in [place]  Referring Provider Ashley Prows, MD       Encounter Date: 08/05/2021  Check In:  Session Check In - 08/05/21 8144       Check-In   Supervising physician immediately available to respond to emergencies [facility] immediately available    Physician(s) Dr Broadus John    Location [facility]    Staff Present Ashley Pall, RN, Ashley Glazier, MS, ACSM-CEP, CCRP, Exercise Physiologist;Ashley Celesta Aver, MS, ACSM CEP, Exercise Physiologist;[name] BS, ACSM EP-C, Exercise Physiologist    Virtual Visit No    Medication changes reported     No    Fall or balance concerns reported    No    Tobacco Cessation No Change    Current number of cigarettes/nicotine per day     0    Warm-up and Cool-down Performed as group-led instruction    Resistance Training Performed No    VAD Patient? No    PAD/SET Patient? No      Pain Assessment   Currently in Pain? No/denies    Pain Score 0-No pain    Multiple Pain Sites No             Capillary Blood Glucose: Results for orders placed or performed during the hospital encounter of 08/05/21 (from the past 24 hour(s))  Glucose, capillary     Status: Abnormal   Collection Time: 08/05/21  9:26 AM  Result Value Ref Range   Glucose-Capillary 156 (H) 70 - 99 mg/dL     Exercise Prescription Changes - 08/05/21 1000       Response to Exercise   Blood Pressure (Admit) 120/64    Blood Pressure (Exercise) 128/72    Blood Pressure (Exit) 128/78    Heart Rate (Admit) 82 bpm    Heart Rate (Exercise) 109 bpm    Heart Rate (Exit) 89 bpm    Oxygen Saturation (Exercise) 96 %    Rating of Perceived Exertion (Exercise) 11    Symptoms None    Comments Pt's first day in the [facility]2 program     Duration Continue with 30 min of aerobic exercise without signs/symptoms of physical distress.    Intensity THRR unchanged      Progression   Progression Continue to progress workloads to maintain intensity without signs/symptoms of physical distress.    Average METs 2.6      Resistance Training   Training Prescription No    Weight No weights on wednesdays      Interval Training   Interval Training No      NuStep   Level 2    SPM 85    Minutes 30    METs 2.6             Social History   Tobacco Use  Smoking Status Former   Packs/day: 0.25   Years: 15.00   Pack years: 3.75   Types: Cigarettes   Quit date: 03/13/1988   Years since quitting: 33.4  Smokeless Tobacco Never    Goals Met:  Exercise tolerated well No report of concerns or symptoms today  Goals Unmet:  Not Applicable  Comments: [name] started cardiac rehab today.  Pt tolerated light exercise without difficulty. VSS, telemetry-Sinus Rhythm, asymptomatic.  Medication list reconciled. Pt denies barriers to medicaiton compliance.  PSYCHOSOCIAL ASSESSMENT:  PHQ-0. Pt exhibits positive coping skills, hopeful outlook with supportive family. No psychosocial needs identified at this time, no psychosocial interventions necessary.    Pt enjoys reading and travelling.   Pt oriented to exercise equipment and routine.    Understanding verbalized. Ashley Pall, RN,BSN 08/05/2021 11:03 AM    Dr. Fransico Him is Medical Director for Cardiac Rehab at [facility].

## 2021-08-07 ENCOUNTER — Encounter (HOSPITAL_COMMUNITY): Payer: Medicare Other

## 2021-08-10 ENCOUNTER — Encounter (HOSPITAL_COMMUNITY)
Admission: RE | Admit: 2021-08-10 | Discharge: 2021-08-10 | Disposition: A | Discharge status: Home / Self Care | Payer: Medicare Other | Source: Ambulatory Visit | Attending: Cardiology | Admitting: Cardiology

## 2021-08-10 ENCOUNTER — Other Ambulatory Visit: Payer: Self-pay

## 2021-08-10 DIAGNOSIS — Z953 Presence of xenogenic heart valve: Secondary | ICD-10-CM

## 2021-08-10 DIAGNOSIS — E119 Type 2 diabetes mellitus without complications: Secondary | ICD-10-CM | POA: Diagnosis not present

## 2021-08-12 ENCOUNTER — Encounter (HOSPITAL_COMMUNITY)
Admission: RE | Admit: 2021-08-12 | Discharge: 2021-08-12 | Disposition: A | Discharge status: Home / Self Care | Payer: Medicare Other | Source: Ambulatory Visit | Attending: Cardiology | Admitting: Cardiology

## 2021-08-12 ENCOUNTER — Other Ambulatory Visit: Payer: Self-pay

## 2021-08-12 DIAGNOSIS — E119 Type 2 diabetes mellitus without complications: Secondary | ICD-10-CM | POA: Diagnosis not present

## 2021-08-12 DIAGNOSIS — Z953 Presence of xenogenic heart valve: Secondary | ICD-10-CM | POA: Diagnosis not present

## 2021-08-12 NOTE — Progress Notes (Signed)
QUALITY OF LIFE SCORE REVIEW  Pt completed Quality of Life survey as a participant in Cardiac Rehab.  Scores 21.0 or below are considered low.  Pt score very low in several areas Overall 24.01, Health and Function 24.40, socioeconomic 26.64, physiological and spiritual 17.14, family 28.80. Patient quality of life slightly altered by physical constraints which limits ability to perform as prior to recent cardiac illness. [name] says that she is dissatisfied with hear health and wants to lose weight is concerned that her blood sugars may drop too low .  [name] also says she has some family issues. Offered emotional support and reassurance.  Will continue to monitor and intervene as necessary.  Ashley Headings RN BSN

## 2021-08-12 NOTE — Progress Notes (Signed)
Cardiac Individual Treatment Plan  Patient Details  Name: [name] MRN: [id number] Date of Birth: 1951/05/26 Referring Provider:   Flowsheet Row CARDIAC REHAB PHASE II ORIENTATION from 07/30/2021 in [facility] CARDIAC REHAB  Referring Provider Yates Decamp, MD       Initial Encounter Date:  Flowsheet Row CARDIAC REHAB PHASE II ORIENTATION from 07/30/2021 in [facility] CARDIAC REHAB  Date 07/30/21       Visit Diagnosis: 06/05/21 S/P Aortic Valve Replacement  Patient's Home Medications on Admission:  Current Outpatient Medications:    acetaminophen (TYLENOL) 500 MG tablet, Take 500 mg by mouth every 6 (six) hours as needed for headache., Disp: , Rfl:    albuterol (PROVENTIL HFA;VENTOLIN HFA) 108 (90 Base) MCG/ACT inhaler, Inhale 2 puffs into the lungs every 6 (six) hours as needed. (Patient taking differently: Inhale 2 puffs into the lungs every 6 (six) hours as needed (Asthma).), Disp: 8.5 g, Rfl: 11   albuterol (PROVENTIL) (2.5 MG/3ML) 0.083% nebulizer solution, Take 3 mLs (2.5 mg total) by nebulization every 6 (six) hours as needed. [id number]:U27.253 (Patient taking differently: Take 2.5 mg by nebulization every 6 (six) hours as needed (Asthma). [id number]:H47.425), Disp: 75 mL, Rfl: 1   ALPRAZolam (XANAX) 1 MG tablet, TAKE ONE-HALF TABLET BY MOUTH ONCE DAILY AS NEEDED FOR STRESS (Patient taking differently: Take 0.5-1 mg by mouth daily as needed for anxiety.), Disp: 90 tablet, Rfl: 0   aspirin (ASPIRIN CHILDRENS) 81 MG chewable tablet, Chew 1 tablet (81 mg total) by mouth daily., Disp: , Rfl:    atorvastatin (LIPITOR) 40 MG tablet, Take 1 tablet (40 mg total) daily by mouth., Disp: 90 tablet, Rfl: 3   augmented betamethasone dipropionate (DIPROLENE-AF) 0.05 % cream, Apply 1 application topically 2 (two) times daily as needed (eczema)., Disp: , Rfl:    Biotin 95638 MCG TABS, Take 10,000 mcg by mouth daily., Disp: , Rfl:    carboxymethylcellulose  (REFRESH PLUS) 0.5 % SOLN, Place 1 drop into both eyes 3 (three) times daily as needed (dry eyes)., Disp: , Rfl:    Cholecalciferol (VITAMIN D) 50 MCG (2000 UT) CAPS, Take 0.5 capsules (1,000 Units total) by mouth daily. (Patient taking differently: Take 2,000 Units by mouth daily.), Disp: , Rfl:    Cyanocobalamin (B-12) 5000 MCG CAPS, Take 5,000 mcg by mouth daily., Disp: , Rfl:    diltiazem (CARDIZEM CD) 240 MG 24 hr capsule, Take 1 capsule (240 mg total) by mouth daily., Disp: 90 capsule, Rfl: 1   Glucose 15 g PACK, Take 15-30 g by mouth daily as needed (low blood sugar)., Disp: , Rfl:    insulin aspart (NOVOLOG) 100 UNIT/ML injection, Inject into the skin continuous. Via Insulin Pump Uses in pump basal rate 3.0 units per hour, bolus 1 unit per 30 carbs, Disp: , Rfl:    L-Lysine 500 MG CAPS, Take 500 mg by mouth daily., Disp: , Rfl:    levocetirizine (XYZAL ALLERGY 24HR) 5 MG tablet, Take 1 tablet (5 mg total) by mouth every evening., Disp: 30 tablet, Rfl: 0   levothyroxine (SYNTHROID) 125 MCG tablet, Take 125 mcg by mouth daily before breakfast., Disp: , Rfl:    losartan-hydrochlorothiazide (HYZAAR) 100-25 MG tablet, Take 1 tablet by mouth every morning., Disp: 90 tablet, Rfl: 3   Magnesium 500 MG TABS, Take 500 mg by mouth at bedtime., Disp: , Rfl:    Melatonin 10 MG TABS, Take 10 mg by mouth at bedtime., Disp: , Rfl:    omeprazole (  PRILOSEC) 40 MG capsule, Take 40 mg by mouth daily., Disp: , Rfl:    OVER THE COUNTER MEDICATION, Take 4 tablets by mouth at bedtime. Focus factor otc supplement, Disp: , Rfl:    Probiotic Product (PROBIOTIC PO), Take 1 capsule by mouth daily. 20 billion active cultures, Disp: , Rfl:    rOPINIRole (REQUIP) 0.5 MG tablet, Take 0.5 mg by mouth at bedtime., Disp: , Rfl:    sertraline (ZOLOFT) 100 MG tablet, Take 100 mg by mouth daily., Disp: , Rfl:    simethicone (GAS-X) 80 MG chewable tablet, Chew 1 tablet (80 mg total) by mouth 4 (four) times daily as needed for  flatulence., Disp: 100 tablet, Rfl: 2  Past Medical History: Past Medical History:  Diagnosis Date   Anxiety    Aortic regurgitation    Arthritis    Asthma    Depression    Depression    Diabetes mellitus    Dyspnea    Facet joint disease of lumbosacral region    Fibromyalgia    GERD (gastroesophageal reflux disease)    Goiter    Hair loss    Heart murmur    Hypertension    Menopause    Mitral regurgitation    Mitral valve prolapse    Other and unspecified hyperlipidemia    Positive PPD    Sleep apnea    Small bowel obstruction ([place]) 02/09/2018   SVT (supraventricular tachycardia) ([place])    Vasculitis ([facility])     Tobacco Use: Social History   Tobacco Use  Smoking Status Former   Packs/day: 0.25   Years: 15.00   Pack years: 3.75   Types: Cigarettes   Quit date: 03/13/1988   Years since quitting: 33.4  Smokeless Tobacco Never    Labs: Recent Review Flowsheet Data     Labs for ITP Cardiac and Pulmonary Rehab Latest Ref Rng & Units 06/05/2021 06/05/2021 06/05/2021 06/05/2021 06/05/2021   Cholestrol <200 mg/dL - - - - -   LDLCALC mg/dL (calc) - - - - -   HDL >50 mg/dL - - - - -   Trlycerides <150 mg/dL - - - - -   Hemoglobin A1c 4.8 - 5.6 % - - - - -   PHART 7.350 - 7.450 7.397 - 7.362 7.297(L) 7.306(L)   PCO2ART 32.0 - 48.0 mmHg 41.1 - 39.5 49.5(H) 55.0(H)   HCO3 20.0 - 28.0 mmol/L 25.3 - 22.8 24.3 27.4   TCO2 22 - 32 mmol/L 26 26 24 26 29    ACIDBASEDEF 0.0 - 2.0 mmol/L - - 3.0(H) 3.0(H) -   O2SAT % 99.0 - 96.0 92.0 92.0       Capillary Blood Glucose: Lab Results  Component Value Date   GLUCAP 156 (H) 08/05/2021   GLUCAP 212 (H) 08/05/2021   GLUCAP 167 (H) 06/13/2021   GLUCAP 195 (H) 06/12/2021   GLUCAP 169 (H) 06/12/2021     Exercise Target Goals: Exercise Program Goal: Individual exercise prescription set using results from initial 6 min walk test and THRR while considering  patient's activity barriers and safety.   Exercise Prescription  Goal: Starting with aerobic activity 30 plus minutes a day, 3 days per week for initial exercise prescription. Provide home exercise prescription and guidelines that participant acknowledges understanding prior to discharge.  Activity Barriers & Risk Stratification:  Activity Barriers & Cardiac Risk Stratification - 07/30/21 1437       Activity Barriers & Cardiac Risk Stratification   Activity Barriers Back Problems;Balance Concerns  Cardiac Risk Stratification High             6 Minute Walk:  6 Minute Walk     Row Name 07/30/21 1412         6 Minute Walk   Phase Initial     Distance 1435 feet     Walk Time 6 minutes     # of Rest Breaks 0     MPH 2.72     METS 3.38     RPE 9     Perceived Dyspnea  0     VO2 Peak 11.81     Symptoms No     Resting HR 99 bpm     Resting BP 108/72     Resting Oxygen Saturation  96 %     Exercise Oxygen Saturation  during 6 min walk 95 %     Max Ex. HR 125 bpm     Max Ex. BP 160/64     2 Minute Post BP 134/60              Oxygen Initial Assessment:   Oxygen Re-Evaluation:   Oxygen Discharge (Final Oxygen Re-Evaluation):   Initial Exercise Prescription:  Initial Exercise Prescription - 07/30/21 1400       Date of Initial Exercise RX and Referring Provider   Date 07/30/21    Referring Provider Adrian Prows, MD    Expected Discharge Date 09/25/21      NuStep   Level 2    SPM 75    Minutes 30    METs 2.2      Prescription Details   Frequency (times per week) 3    Duration Progress to 30 minutes of continuous aerobic without signs/symptoms of physical distress      Intensity   THRR 40-80% of Max Heartrate 60-119    Ratings of Perceived Exertion 11-13    Perceived Dyspnea 0-4      Progression   Progression Continue progressive overload as per policy without signs/symptoms or physical distress.      Resistance Training   Training Prescription Yes    Weight 2 lbs    Reps 10-15             Perform  Capillary Blood Glucose checks as needed.  Exercise Prescription Changes:   Exercise Prescription Changes     Row Name 08/05/21 1000             Response to Exercise   Blood Pressure (Admit) 120/64       Blood Pressure (Exercise) 128/72       Blood Pressure (Exit) 128/78       Heart Rate (Admit) 82 bpm       Heart Rate (Exercise) 109 bpm       Heart Rate (Exit) 89 bpm       Oxygen Saturation (Exercise) 96 %       Rating of Perceived Exertion (Exercise) 11       Symptoms None       Comments Pt's first day in the CRP2 program       Duration Continue with 30 min of aerobic exercise without signs/symptoms of physical distress.       Intensity THRR unchanged         Progression   Progression Continue to progress workloads to maintain intensity without signs/symptoms of physical distress.       Average METs 2.6         Resistance Training  Training Prescription No       Weight No weights on wednesdays         Interval Training   Interval Training No         NuStep   Level 2       SPM 85       Minutes 30       METs 2.6                Exercise Comments:   Exercise Comments     Row Name 08/05/21 1106           Exercise Comments Pt's first day in the CRP2 program. Pt tolerated the session well today with no comaplaints.                Exercise Goals and Review:   Exercise Goals     Row Name 07/30/21 1437             Exercise Goals   Increase Physical Activity Yes       Intervention Provide advice, education, support and counseling about physical activity/exercise needs.;Develop an individualized exercise prescription for aerobic and resistive training based on initial evaluation findings, risk stratification, comorbidities and participant's personal goals.       Expected Outcomes Short Term: Attend rehab on a regular basis to increase amount of physical activity.;Long Term: Add in home exercise to make exercise part of routine and to increase  amount of physical activity.;Long Term: Exercising regularly at least 3-5 days a week.       Increase Strength and Stamina Yes       Intervention Provide advice, education, support and counseling about physical activity/exercise needs.;Develop an individualized exercise prescription for aerobic and resistive training based on initial evaluation findings, risk stratification, comorbidities and participant's personal goals.       Expected Outcomes Short Term: Increase workloads from initial exercise prescription for resistance, speed, and METs.;Short Term: Perform resistance training exercises routinely during rehab and add in resistance training at home;Long Term: Improve cardiorespiratory fitness, muscular endurance and strength as measured by increased METs and functional capacity (6MWT)       Able to understand and use rate of perceived exertion (RPE) scale Yes       Intervention Provide education and explanation on how to use RPE scale       Expected Outcomes Short Term: Able to use RPE daily in rehab to express subjective intensity level;Long Term:  Able to use RPE to guide intensity level when exercising independently       Knowledge and understanding of Target Heart Rate Range (THRR) Yes       Intervention Provide education and explanation of THRR including how the numbers were predicted and where they are located for reference       Expected Outcomes Short Term: Able to state/look up THRR;Short Term: Able to use daily as guideline for intensity in rehab;Long Term: Able to use THRR to govern intensity when exercising independently       Understanding of Exercise Prescription Yes       Intervention Provide education, explanation, and written materials on patient's individual exercise prescription       Expected Outcomes Short Term: Able to explain program exercise prescription;Long Term: Able to explain home exercise prescription to exercise independently                Exercise Goals  Re-Evaluation :  Exercise Goals Re-Evaluation     [place] Name 08/05/21 1104  Exercise Goal Re-Evaluation   Exercise Goals Review Increase Physical Activity;Increase Strength and Stamina;Able to understand and use rate of perceived exertion (RPE) scale;Knowledge and understanding of Target Heart Rate Range (THRR);Understanding of Exercise Prescription       Comments Pt's first day in the CRP2 program. Pt understands the exercise Rx, THRR, and RPE scale.       Expected Outcomes Will continue to monitor patient and progress exercise workloads as tolerated.                 Discharge Exercise Prescription (Final Exercise Prescription Changes):  Exercise Prescription Changes - 08/05/21 1000       Response to Exercise   Blood Pressure (Admit) 120/64    Blood Pressure (Exercise) 128/72    Blood Pressure (Exit) 128/78    Heart Rate (Admit) 82 bpm    Heart Rate (Exercise) 109 bpm    Heart Rate (Exit) 89 bpm    Oxygen Saturation (Exercise) 96 %    Rating of Perceived Exertion (Exercise) 11    Symptoms None    Comments Pt's first day in the CRP2 program    Duration Continue with 30 min of aerobic exercise without signs/symptoms of physical distress.    Intensity THRR unchanged      Progression   Progression Continue to progress workloads to maintain intensity without signs/symptoms of physical distress.    Average METs 2.6      Resistance Training   Training Prescription No    Weight No weights on wednesdays      Interval Training   Interval Training No      NuStep   Level 2    SPM 85    Minutes 30    METs 2.6             Nutrition:  Target Goals: Understanding of nutrition guidelines, daily intake of sodium 1500mg , cholesterol 200mg , calories 30% from fat and 7% or less from saturated fats, daily to have 5 or more servings of fruits and vegetables.  Biometrics:  Pre Biometrics - 07/30/21 1413       Pre Biometrics   Waist Circumference 39.5 inches     Hip Circumference 44 inches    Waist to Hip Ratio 0.9 %    Triceps Skinfold 28 mm    % Body Fat 42.4 %    Grip Strength 20 kg    Flexibility 12.25 in    Single Leg Stand 6.62 seconds              Nutrition Therapy Plan and Nutrition Goals:   Nutrition Assessments:  MEDIFICTS Score Key: ?70 Need to make dietary changes  40-70 Heart Healthy Diet ? 40 Therapeutic Level Cholesterol Diet   Picture Your Plate Scores: D34-534 Unhealthy dietary pattern with much room for improvement. 41-50 Dietary pattern unlikely to meet recommendations for good health and room for improvement. 51-60 More healthful dietary pattern, with some room for improvement.  >60 Healthy dietary pattern, although there may be some specific behaviors that could be improved.    Nutrition Goals Re-Evaluation:   Nutrition Goals Discharge (Final Nutrition Goals Re-Evaluation):   Psychosocial: Target Goals: Acknowledge presence or absence of significant depression and/or stress, maximize coping skills, provide positive support system. Participant is able to verbalize types and ability to use techniques and skills needed for reducing stress and depression.  Initial Review & Psychosocial Screening:  Initial Psych Review & Screening - 07/30/21 1446       Initial Review  Current issues with History of Depression   History of Anxiety     Family Dynamics   Good Support System? Yes   Ethelle has her husband and her church family for support   Comments Sherrilee has a history of depression and is taking an antidepressant. Canei says the her depression is currently controlled at this time.      Barriers   Psychosocial barriers to participate in program There are no identifiable barriers or psychosocial needs.      Screening Interventions   Interventions Encouraged to exercise             Quality of Life Scores:  Quality of Life - 07/30/21 1429       Quality of Life   Select Quality of Life       Quality of Life Scores   Health/Function Pre 24.4 %    Socioeconomic Pre 26.64 %    Psych/Spiritual Pre 17.14 %    Family Pre 28.8 %    GLOBAL Pre 24.01 %            Scores of 19 and below usually indicate a poorer quality of life in these areas.  A difference of  2-3 points is a clinically meaningful difference.  A difference of 2-3 points in the total score of the Quality of Life Index has been associated with significant improvement in overall quality of life, self-image, physical symptoms, and general health in studies assessing change in quality of life.  PHQ-9: Recent Review Flowsheet Data     Depression screen [facility] 2/9 07/30/2021 06/30/2017 03/04/2016 08/28/2015 03/25/2015   Decreased Interest 0 1 3 2  0   Down, Depressed, Hopeless 0 2 - 0 0   PHQ - 2 Score 0 3 3 2  0   Altered sleeping - - 2 2 -   Tired, decreased energy - 3 3 3  -   Change in appetite - 3 3 3  -   Feeling bad or failure about yourself  - 3 2 3  -   Trouble concentrating - 2 0 0 -   Moving slowly or fidgety/restless - 2 0 0 -   Suicidal thoughts - 0 0 0 -   PHQ-9 Score - - 13 13 -   Difficult doing work/chores - Somewhat difficult - - -      Interpretation of Total Score  Total Score Depression Severity:  1-4 = Minimal depression, 5-9 = Mild depression, 10-14 = Moderate depression, 15-19 = Moderately severe depression, 20-27 = Severe depression   Psychosocial Evaluation and Intervention:   Psychosocial Re-Evaluation:  Psychosocial Re-Evaluation     Row Name 08/12/21 [phone]             Psychosocial Re-Evaluation   Current issues with History of Depression       Comments Reviewed quality of life questionnaire with Nishat today. No increased cocerns or stressors voiced       Expected Outcomes Natividad will have continued controlled depression upon completion of phase 2 cardiac rehab.       Interventions Stress management education;Encouraged to attend Cardiac Rehabilitation for the exercise        Continue Psychosocial Services  Follow up required by counselor                Psychosocial Discharge (Final Psychosocial Re-Evaluation):  Psychosocial Re-Evaluation - 08/12/21 0949       Psychosocial Re-Evaluation   Current issues with History of Depression    Comments Reviewed quality of  life questionnaire with Cletta today. No increased cocerns or stressors voiced    Expected Outcomes Doneshia will have continued controlled depression upon completion of phase 2 cardiac rehab.    Interventions Stress management education;Encouraged to attend Cardiac Rehabilitation for the exercise    Continue Psychosocial Services  Follow up required by counselor             Vocational Rehabilitation: Provide vocational rehab assistance to qualifying candidates.   Vocational Rehab Evaluation & Intervention:  Vocational Rehab - 07/30/21 1451       Initial Vocational Rehab Evaluation & Intervention   Assessment shows need for Vocational Rehabilitation No   Keira is retired and does not need vocational rehab at this time.            Education: Education Goals: Education classes will be provided on a weekly basis, covering required topics. Participant will state understanding/return demonstration of topics presented.  Learning Barriers/Preferences:  Learning Barriers/Preferences - 07/30/21 1431       Learning Barriers/Preferences   Learning Barriers Sight   wears glasses   Learning Preferences Audio;Written Material;Computer/Internet;Group Instruction;Individual Instruction;Pictoral;Skilled Demonstration;Verbal Instruction;Video             Education Topics: Hypertension, Hypertension Reduction -Define heart disease and high blood pressure. Discus how high blood pressure affects the body and ways to reduce high blood pressure.   Exercise and Your Heart -Discuss why it is important to exercise, the FITT principles of exercise, normal and abnormal responses to exercise,  and how to exercise safely.   Angina -Discuss definition of angina, causes of angina, treatment of angina, and how to decrease risk of having angina.   Cardiac Medications -Review what the following cardiac medications are used for, how they affect the body, and side effects that may occur when taking the medications.  Medications include Aspirin, Beta blockers, calcium channel blockers, ACE Inhibitors, angiotensin receptor blockers, diuretics, digoxin, and antihyperlipidemics.   Congestive Heart Failure -Discuss the definition of CHF, how to live with CHF, the signs and symptoms of CHF, and how keep track of weight and sodium intake.   Heart Disease and Intimacy -Discus the effect sexual activity has on the heart, how changes occur during intimacy as we age, and safety during sexual activity.   Smoking Cessation / COPD -Discuss different methods to quit smoking, the health benefits of quitting smoking, and the definition of COPD.   Nutrition I: Fats -Discuss the types of cholesterol, what cholesterol does to the heart, and how cholesterol levels can be controlled.   Nutrition II: Labels -Discuss the different components of food labels and how to read food label   Heart Parts/Heart Disease and PAD -Discuss the anatomy of the heart, the pathway of blood circulation through the heart, and these are affected by heart disease.   Stress I: Signs and Symptoms -Discuss the causes of stress, how stress may lead to anxiety and depression, and ways to limit stress.   Stress II: Relaxation -Discuss different types of relaxation techniques to limit stress.   Warning Signs of Stroke / TIA -Discuss definition of a stroke, what the signs and symptoms are of a stroke, and how to identify when someone is having stroke.   Knowledge Questionnaire Score:  Knowledge Questionnaire Score - 07/30/21 1421       Knowledge Questionnaire Score   Pre Score 21/24             Core  Components/Risk Factors/Patient Goals at Admission:  Personal Goals and Risk  Factors at Admission - 07/30/21 1429       Core Components/Risk Factors/Patient Goals on Admission    Weight Management Yes;Obesity;Weight Loss    Intervention Weight Management: Develop a combined nutrition and exercise program designed to reach desired caloric intake, while maintaining appropriate intake of nutrient and fiber, sodium and fats, and appropriate energy expenditure required for the weight goal.;Weight Management: Provide education and appropriate resources to help participant work on and attain dietary goals.;Weight Management/Obesity: Establish reasonable short term and long term weight goals.;Obesity: Provide education and appropriate resources to help participant work on and attain dietary goals.    Admit Weight 170 lb 13.7 oz (77.5 kg)    Expected Outcomes Short Term: Continue to assess and modify interventions until short term weight is achieved;Long Term: Adherence to nutrition and physical activity/exercise program aimed toward attainment of established weight goal;Weight Maintenance: Understanding of the daily nutrition guidelines, which includes 25-35% calories from fat, 7% or less cal from saturated fats, less than 200mg  cholesterol, less than 1.5gm of sodium, & 5 or more servings of fruits and vegetables daily;Weight Loss: Understanding of general recommendations for a balanced deficit meal plan, which promotes 1-2 lb weight loss per week and includes a negative energy balance of [phone] kcal/d;Understanding recommendations for meals to include 15-35% energy as protein, 25-35% energy from fat, 35-60% energy from carbohydrates, less than 200mg  of dietary cholesterol, 20-35 gm of total fiber daily;Understanding of distribution of calorie intake throughout the day with the consumption of 4-5 meals/snacks    Diabetes Yes    Intervention Provide education about signs/symptoms and action to take for  hypo/hyperglycemia.;Provide education about proper nutrition, including hydration, and aerobic/resistive exercise prescription along with prescribed medications to achieve blood glucose in normal ranges: Fasting glucose 65-99 mg/dL    Expected Outcomes Short Term: Participant verbalizes understanding of the signs/symptoms and immediate care of hyper/hypoglycemia, proper foot care and importance of medication, aerobic/resistive exercise and nutrition plan for blood glucose control.;Long Term: Attainment of HbA1C < 7%.    Hypertension Yes    Intervention Provide education on lifestyle modifcations including regular physical activity/exercise, weight management, moderate sodium restriction and increased consumption of fresh fruit, vegetables, and low fat dairy, alcohol moderation, and smoking cessation.;Monitor prescription use compliance.    Expected Outcomes Short Term: Continued assessment and intervention until BP is < 140/55mm HG in hypertensive participants. < 130/70mm HG in hypertensive participants with diabetes, heart failure or chronic kidney disease.;Long Term: Maintenance of blood pressure at goal levels.    Lipids Yes    Intervention Provide education and support for participant on nutrition & aerobic/resistive exercise along with prescribed medications to achieve LDL 70mg , HDL >40mg .    Expected Outcomes Short Term: Participant states understanding of desired cholesterol values and is compliant with medications prescribed. Participant is following exercise prescription and nutrition guidelines.;Long Term: Cholesterol controlled with medications as prescribed, with individualized exercise RX and with personalized nutrition plan. Value goals: LDL < 70mg , HDL > 40 mg.             Core Components/Risk Factors/Patient Goals Review:   Goals and Risk Factor Review     Row Name 08/05/21 1007 08/12/21 0955           Core Components/Risk Factors/Patient Goals Review   Personal Goals Review  Weight Management/Obesity;Hypertension;Lipids;Diabetes Weight Management/Obesity;Hypertension;Lipids;Diabetes;Stress      Review Tamie started exercise at cardiac rehab on 08/05/21 and did well with exercise. Vital signs and CBG's were stable. Cherryl is off to good start  to exercise at phase 2 cardiac rehab. Vital signs and CBG's have been stable.      Expected Outcomes Pamala will continue to participate in phase 2 cardiac rehab for exercise, nutrition and lifestyle modifications. Kendalynn will continue to participate in phase 2 cardiac rehab for exercise, nutrition and lifestyle modifications               Core Components/Risk Factors/Patient Goals at Discharge (Final Review):   Goals and Risk Factor Review - 08/12/21 0955       Core Components/Risk Factors/Patient Goals Review   Personal Goals Review Weight Management/Obesity;Hypertension;Lipids;Diabetes;Stress    Review Doretta is off to good start to exercise at phase 2 cardiac rehab. Vital signs and CBG's have been stable.    Expected Outcomes Kerstie will continue to participate in phase 2 cardiac rehab for exercise, nutrition and lifestyle modifications             ITP Comments:  ITP Comments     Row Name 07/30/21 1444 08/05/21 1004 08/12/21 0948       ITP Comments Dr Fransico Him MD, Medical Director 30 Day ITP Review. Munachiso started cardiac rehab on 08/05/21 and did well with exercise. 30 Day ITP Review. Carlin has good attendance and participation in phase 2 cardiac rehab. Dyesha is off to a good start to exercise.              Comments: See ITP comments.Harrell Gave RN BSN

## 2021-08-14 ENCOUNTER — Encounter (HOSPITAL_COMMUNITY)
Admission: RE | Admit: 2021-08-14 | Discharge: 2021-08-14 | Disposition: A | Discharge status: Home / Self Care | Payer: Medicare Other | Source: Ambulatory Visit | Attending: Cardiology | Admitting: Cardiology

## 2021-08-14 ENCOUNTER — Other Ambulatory Visit: Payer: Self-pay

## 2021-08-14 DIAGNOSIS — Z953 Presence of xenogenic heart valve: Secondary | ICD-10-CM | POA: Diagnosis not present

## 2021-08-17 ENCOUNTER — Telehealth (HOSPITAL_COMMUNITY): Payer: Self-pay | Admitting: *Deleted

## 2021-08-17 ENCOUNTER — Encounter (HOSPITAL_COMMUNITY): Payer: Medicare Other

## 2021-08-17 NOTE — Telephone Encounter (Signed)
Patient left message on department voicemail that she'll be absent from cardiac rehab today due to a family emergency.

## 2021-08-19 ENCOUNTER — Other Ambulatory Visit: Payer: Self-pay

## 2021-08-19 ENCOUNTER — Encounter (HOSPITAL_COMMUNITY)
Admission: RE | Admit: 2021-08-19 | Discharge: 2021-08-19 | Disposition: A | Discharge status: Home / Self Care | Payer: Medicare Other | Source: Ambulatory Visit | Attending: Cardiology | Admitting: Cardiology

## 2021-08-19 DIAGNOSIS — Z953 Presence of xenogenic heart valve: Secondary | ICD-10-CM | POA: Diagnosis not present

## 2021-08-21 ENCOUNTER — Encounter (HOSPITAL_COMMUNITY)
Admission: RE | Admit: 2021-08-21 | Discharge: 2021-08-21 | Disposition: A | Discharge status: Home / Self Care | Payer: Medicare Other | Source: Ambulatory Visit | Attending: Cardiology | Admitting: Cardiology

## 2021-08-21 ENCOUNTER — Other Ambulatory Visit: Payer: Self-pay

## 2021-08-21 DIAGNOSIS — Z953 Presence of xenogenic heart valve: Secondary | ICD-10-CM | POA: Diagnosis not present

## 2021-08-24 ENCOUNTER — Encounter (HOSPITAL_COMMUNITY)
Admission: RE | Admit: 2021-08-24 | Discharge: 2021-08-24 | Disposition: A | Discharge status: Home / Self Care | Payer: Medicare Other | Source: Ambulatory Visit | Attending: Cardiology | Admitting: Cardiology

## 2021-08-24 ENCOUNTER — Other Ambulatory Visit: Payer: Self-pay

## 2021-08-24 ENCOUNTER — Telehealth (HOSPITAL_COMMUNITY): Payer: Self-pay

## 2021-08-24 DIAGNOSIS — Z953 Presence of xenogenic heart valve: Secondary | ICD-10-CM

## 2021-08-24 NOTE — Telephone Encounter (Signed)
-----   Message from Yates Decamp, MD sent at 08/21/2021  6:09 PM EST ----- Regarding: RE: Target Heart Rate Increase Yes please. NO contraindication JG ----- Message ----- From: Lorin Picket Sent: 08/21/2021  12:18 PM EST To: Yates Decamp, MD, Cammy Copa, RN Subject: Target Heart Rate Increase                     Good afternoon, Dr. Jacinto Halim,  Your patient, [name] (70 yrs old), has completed 5 sessions here in the [facility] program. We have noted that she exceeds her current THR of 119 (80% of MPHR). We are requesting a THR increase to 134 which is 90% of her MPHR. Thank you for your consideration of this request. If you need any additional information please contact me. Thank you.  Regards,  Lorin Picket MS, ACSM-CEP, CCRP

## 2021-08-26 ENCOUNTER — Encounter (HOSPITAL_COMMUNITY)
Admission: RE | Admit: 2021-08-26 | Discharge: 2021-08-26 | Disposition: A | Discharge status: Home / Self Care | Payer: Medicare Other | Source: Ambulatory Visit | Attending: Cardiology | Admitting: Cardiology

## 2021-08-26 ENCOUNTER — Other Ambulatory Visit: Payer: Self-pay

## 2021-08-26 DIAGNOSIS — Z953 Presence of xenogenic heart valve: Secondary | ICD-10-CM

## 2021-08-27 ENCOUNTER — Encounter: Payer: Self-pay | Admitting: Cardiology

## 2021-08-27 ENCOUNTER — Ambulatory Visit: Payer: Medicare Other | Admitting: Cardiology

## 2021-08-27 VITALS — BP 130/56 | HR 86 | Temp 97.3°F | Resp 16 | Ht 63.0 in | Wt 172.2 lb

## 2021-08-27 DIAGNOSIS — Q231 Congenital insufficiency of aortic valve: Secondary | ICD-10-CM | POA: Diagnosis not present

## 2021-08-27 DIAGNOSIS — Z954 Presence of other heart-valve replacement: Secondary | ICD-10-CM

## 2021-08-27 DIAGNOSIS — I129 Hypertensive chronic kidney disease with stage 1 through stage 4 chronic kidney disease, or unspecified chronic kidney disease: Secondary | ICD-10-CM | POA: Diagnosis not present

## 2021-08-27 DIAGNOSIS — N1831 Chronic kidney disease, stage 3a: Secondary | ICD-10-CM | POA: Diagnosis not present

## 2021-08-27 DIAGNOSIS — I10 Essential (primary) hypertension: Secondary | ICD-10-CM

## 2021-08-27 DIAGNOSIS — I6522 Occlusion and stenosis of left carotid artery: Secondary | ICD-10-CM | POA: Diagnosis not present

## 2021-08-27 NOTE — Progress Notes (Signed)
Primary Physician/Referring:  Irena Reichmann, DO  Patient ID: [name], female    DOB: 01-Jan-1951, 70 y.o.   MRN: [id number]  Chief Complaint  Patient presents with   Aortic Stenosis    HPI:    [name]  is a 70 y.o. Caucasian female with hypertension, diet controlled DM, mild obesity, hyperlipidemia, fibromyalgia and PSVT, bicuspid aortic valve with aortic stenosis.  Patient underwent aortic valve replacement with a bioprosthetic tissue valve on 06/05/2021.  She did have postoperative atrial fibrillation which resolved.  Presently on aspirin alone.  Has recuperated well, no further dyspnea, she is presently undergoing cardiac rehab and enjoys it.  No leg edema, no PND or orthopnea.    Past Medical History:  Diagnosis Date   Anxiety    Aortic regurgitation    Arthritis    Asthma    Depression    Depression    Diabetes mellitus    Dyspnea    Facet joint disease of lumbosacral region    Fibromyalgia    GERD (gastroesophageal reflux disease)    Goiter    Hair loss    Heart murmur    Hypertension    Menopause    Mitral regurgitation    Mitral valve prolapse    Other and unspecified hyperlipidemia    Positive PPD    Sleep apnea    Small bowel obstruction (HCC) 02/09/2018   SVT (supraventricular tachycardia) (HCC)    Vasculitis (HCC)    Past Surgical History:  Procedure Laterality Date   ABDOMINAL HYSTERECTOMY     AORTIC VALVE REPLACEMENT N/A 06/05/2021   Procedure: AORTIC VALVE REPLACEMENT (AVR), USING INSPIRIS RESILIA  AORTIC VALVE;  Surgeon: Alleen Borne, MD;  Location: [facility] OR;  Service: Open Heart Surgery;  Laterality: N/A;   APPENDECTOMY     CARDIAC CATHETERIZATION     CHOLECYSTECTOMY     DIAGNOSTIC LAPAROSCOPY     EYE SURGERY Bilateral    cataract removal   PARTIAL HYSTERECTOMY     RIGHT/LEFT HEART CATH AND CORONARY ANGIOGRAPHY N/A 04/28/2021   Procedure: RIGHT/LEFT HEART CATH AND CORONARY ANGIOGRAPHY;  Surgeon: Yates Decamp, MD;   Location: [facility] INVASIVE CV LAB;  Service: Cardiovascular;  Laterality: N/A;   TEE WITHOUT CARDIOVERSION N/A 06/05/2021   Procedure: TRANSESOPHAGEAL ECHOCARDIOGRAM (TEE);  Surgeon: Alleen Borne, MD;  Location: [facility] OR;  Service: Open Heart Surgery;  Laterality: N/A;   Family History  Problem Relation Age of Onset   Hepatitis Mother 68       died from Hep A   Diabetes Father    Cancer Brother    Crohn's disease Brother    Alcohol abuse Brother    Sleep apnea Neg Hx     Social History   Tobacco Use   Smoking status: Former    Packs/day: 0.25    Years: 15.00    Pack years: 3.75    Types: Cigarettes    Quit date: 03/13/1988    Years since quitting: 33.4   Smokeless tobacco: Never  Substance Use Topics   Alcohol use: No   Marital Status: Married  ROS  Review of Systems  Cardiovascular:  Negative for dyspnea on exertion and leg swelling.  Musculoskeletal:  Positive for arthritis and back pain. Negative for myalgias.  Gastrointestinal:  Negative for heartburn and melena.  Objective  Blood pressure (!) 130/56, pulse 86, temperature (!) 97.3 F (36.3 C), temperature source Temporal, resp. rate 16, height 5\' 3"  (1.6 m), weight 172 lb  3.2 oz (78.1 kg), SpO2 95 %.  Vitals with BMI 08/27/2021 07/30/2021 07/24/2021  Height 5\' 3"  5\' 3"  5\' 3"   Weight 172 lbs 3 oz 170 lbs 14 oz 174 lbs 13 oz  BMI 30.51 [id number]  Systolic [id number]  Diastolic 56 72 81  Pulse 86 99 77     Physical Exam Constitutional:      General: She is not in acute distress.    Comments: Moderately built and mildly obese.  Most obesity is truncal obesity.  Neck:     Vascular: No carotid bruit or JVD.  Cardiovascular:     Rate and Rhythm: Normal rate and regular rhythm.     Pulses: Normal pulses and intact distal pulses.     Heart sounds: Murmur heard.  Low-pitched early systolic murmur is present with a grade of 1/6 at the upper right sternal border.    No gallop.  Pulmonary:     Effort: Pulmonary effort is  normal. No accessory muscle usage or respiratory distress.     Breath sounds: Normal breath sounds.  Abdominal:     General: Bowel sounds are normal.     Palpations: Abdomen is soft.     Comments: Reducible ventral hernia noted.  Obese abdomen.   Musculoskeletal:     Right lower leg: No edema.     Left lower leg: No edema.   Laboratory examination:   Recent Labs    06/11/21 0739 06/12/21 0444 06/13/21 0435  NA 141 137 135  K 3.2* 4.3 4.4  CL 113* 104 101  CO2 21* 24 25  GLUCOSE 100* 133* 169*  BUN 8 11 9   CREATININE 0.60 0.77 0.87  CALCIUM 7.1* 8.8* 9.3  GFRNONAA >60 >60 >60   CrCl cannot be calculated (Patient's most recent lab result is older than the maximum 21 days allowed.).  CMP Latest Ref Rng & Units 06/13/2021 06/12/2021 06/11/2021  Glucose 70 - 99 mg/dL 169(H) 133(H) 100(H)  BUN 8 - 23 mg/dL 9 11 8   Creatinine 0.44 - 1.00 mg/dL 0.87 0.77 0.60  Sodium 135 - 145 mmol/L 135 137 141  Potassium 3.5 - 5.1 mmol/L 4.4 4.3 3.2(L)  Chloride 98 - 111 mmol/L 101 104 113(H)  CO2 22 - 32 mmol/L 25 24 21(L)  Calcium 8.9 - 10.3 mg/dL 9.3 8.8(L) 7.1(L)  Total Protein 6.5 - 8.1 g/dL - - -  Total Bilirubin 0.3 - 1.2 mg/dL - - -  Alkaline Phos 38 - 126 U/L - - -  AST 15 - 41 U/L - - -  ALT 0 - 44 U/L - - -   CBC Latest Ref Rng & Units 06/08/2021 06/07/2021 06/06/2021  WBC 4.0 - 10.5 K/uL 17.3(H) 19.3(H) 22.2(H)  Hemoglobin 12.0 - 15.0 g/dL 10.3(L) 10.4(L) 10.5(L)  Hematocrit 36.0 - 46.0 % 32.2(L) 31.7(L) 33.0(L)  Platelets 150 - 400 K/uL 148(L) 117(L) 138(L)   External labs:   Labs 07/17/2020:  Hb 14.6/HCT 44.9, platelets 229.  Normal indicis.  Serum glucose 157, BUN 14, creatinine 1.1, GFR 51 mL.  Potassium 4.3.  CMP otherwise normal.  Total cholesterol 151, triglycerides 184, HDL 44, LDL 76.  TSH normal at 3.19.   Medications and allergies   Allergies  Allergen Reactions   Penicillins Anaphylaxis    Has patient had a PCN reaction causing immediate rash,  facial/tongue/throat swelling, SOB or lightheadedness with hypotension: No Has patient had a PCN reaction causing severe rash involving mucus membranes or skin necrosis: No Has patient  had a PCN reaction that required hospitalization: Yes/ Has patient had a PCN reaction occurring within the last 10 years: No If all of the above answers are "NO", then may proceed with Cephalosporin use.    Ace Inhibitors     INDUCED BRONCHOSPASM   Contrast Media [Iodinated Diagnostic Agents]      Current Outpatient Medications  Medication Instructions   acetaminophen (TYLENOL) 500 mg, Oral, Every 6 hours PRN   albuterol (PROVENTIL HFA;VENTOLIN HFA) 108 (90 Base) MCG/ACT inhaler 2 puffs, Inhalation, Every 6 hours PRN   albuterol (PROVENTIL) 2.5 mg, Nebulization, Every 6 hours PRN, ICD10:J45.909   ALPRAZolam (XANAX) 1 MG tablet TAKE ONE-HALF TABLET BY MOUTH ONCE DAILY AS NEEDED FOR STRESS   aspirin (ASPIRIN CHILDRENS) 81 mg, Oral, Daily   atorvastatin (LIPITOR) 40 mg, Oral, Daily   augmented betamethasone dipropionate (DIPROLENE-AF) [id number] % cream 1 application, Topical, 2 times daily PRN   B-12 5,000 mcg, Oral, Daily   Biotin 10,000 mcg, Oral, Daily   carboxymethylcellulose (REFRESH PLUS) 0.5 % SOLN 1 drop, Both Eyes, 3 times daily PRN   diltiazem (CARDIZEM CD) 240 mg, Oral, Daily   famotidine (PEPCID) 20 mg, Oral, 2 times daily   Glucose 15-30 g, Oral, Daily PRN   insulin aspart (NOVOLOG) 100 UNIT/ML injection Subcutaneous, Continuous, Via Insulin Pump Uses in pump basal rate 3.0 units per hour, bolus 1 unit per 30 carbs   L-Lysine 500 mg, Oral, Daily   levocetirizine (XYZAL ALLERGY 24HR) 5 mg, Oral, Every evening   levothyroxine (SYNTHROID) 125 mcg, Oral, Daily before breakfast   losartan-hydrochlorothiazide (HYZAAR) 100-25 MG tablet 1 tablet, Oral, BH-each morning   Magnesium 500 mg, Oral, Daily at bedtime   Melatonin 10 mg, Oral, Daily at bedtime   omeprazole (PRILOSEC) 40 mg, Oral, Daily   OVER THE  COUNTER MEDICATION 4 tablets, Oral, Daily at bedtime, Focus factor otc supplement   Probiotic Product (PROBIOTIC PO) 1 capsule, Oral, Daily, 20 billion active cultures   rOPINIRole (REQUIP) 0.5 mg, Oral, Daily at bedtime   sertraline (ZOLOFT) 100 mg, Oral, Daily   simethicone (GAS-X) 80 mg, Oral, 4 times daily PRN   Vitamin D 1,000 Units, Oral, Daily   Radiology:   No results found.  Cardiac Studies:   Exercise myoview stress 09/02/2014: 1. The resting electrocardiogram demonstrated normal sinus rhythm, normal resting conduction, no resting arrhythmias and nonspecific ST-T changes. The stress electrocardiogram was normal. The patient performed treadmill exercise using a Bruce protocol, completing 6:45 minutes. The patient completed an estimated workload of 10.2 METS. The blood pressure response to exercise was normal. The stress test was terminated because of fatigue. 2. The overall quality of the study is good. There is no evidence of abnormal lung activity. Stress and rest SPECT images demonstrate homogeneous tracer distribution throughout the myocardium. Gated SPECT imaging reveals normal myocardial thickening and wall motion. The left ventricular ejection fraction was normal (44%) but visually appears to be normal. This is a low risk study. No significant change from Exercise sestamibi stress test on 05/08/2009.  Sleep study 10/17/2014 (Dohmeier, MD): Mild to moderate obstructive sleep apnea. Effectively treated with CPAP. Compliant  Carotid artery duplex 03/25/2021: No evidence of significant stenosis in the right carotid vessels. Duplex suggests stenosis in the left internal carotid artery (50-69%). Antegrade right vertebral artery flow. Antegrade left vertebral artery flow. Compared to the study done on 05/10/2017, left carotid stenosis is new.  Follow up in six months is appropriate if clinically indicated.  Right and left  heart catheterization 04/28/2021: Normal coronary arteries, left  dominant circulation. LV: 171/8, EDP 17.  Ao 104/46, mean 67 mmHg.  Peak to peak pressure gradient 50 and mean pressure gradient across the aortic valve of 44.6 mmHg.  Calculated aortic valve area 0.89 cm.  QP/QS 1.00. RA 16/13, mean 13; RV 35/1, EDP 9; PA 26/9, mean 14.  PA saturation 74%.  PW 23/24, mean 22 mmHg. CO 5.17, CI 2.83.  Normal.  PCV ECHOCARDIOGRAM COMPLETE 07/29/2021  Narrative Echocardiogram 07/29/2021: Normal LV systolic function with visual EF 55-60%. Left ventricle cavity is normal in size. Moderate left ventricular hypertrophy. Normal global wall motion. Indeterminate diastolic filling pattern, normal LAP. Abnormal septal wall motion due to post-operative valve. 37mm Bioprosthetic Inspiris Resilia aortic valve is well-seated, no evidence of dehiscence, trivial perivalvular regurgitation. No valvular stenosis or regurgitation. Peak velocity 1.37m/s, Peak Pressure Gradient 7.9 mmHg, Mean Gradient 4.6 mmHg, AVA 2.0cm. Mild tricuspid regurgitation. No evidence of pulmonary hypertension. Compared to study 03/25/2021 LVEF remains stable, AV is surgically replaced, diastolic function and LAP have improved. Otherwise, no significant change.   EKG  EKG 08/27/2021: Normal sinus rhythm at rate of 83 bpm, normal axis, incomplete right bundle branch block.  Nonspecific T wave flattening.  No significant change from 07/24/2021.  Assessment     ICD-10-CM   1. Bicuspid aortic valve  Q23.1 EKG 12-Lead    2. H/O aortic valve replacement with tissue graft 27MM INSPIRIS RESILIA  AORTIC VALVE 05/31/2021  Z95.4     3. Primary hypertension  I10     4. Stage 3a chronic kidney disease (HCC)  N18.31       No orders of the defined types were placed in this encounter.     There are no discontinued medications.  Recommendations:   [name]  is a 70 y.o. Caucasian female with hypertension, diet controlled DM, mild obesity, hyperlipidemia, fibromyalgia and PSVT, bicuspid aortic  valve with aortic stenosis.  Patient underwent aortic valve replacement with a bioprosthetic tissue valve on 06/05/2021.  She did have postoperative atrial fibrillation which resolved.  Presently on aspirin alone.  She is currently doing well, no clinical evidence of heart failure, EKG reveals normal sinus rhythm.  Blood pressure is well controlled.  Reviewed the results of the echocardiogram which reveals excellent repair of the aortic valve.  She is on appropriate medical therapy.  She does have mild stage IIIa chronic kidney disease that has remained stable.  CBC has also remained stable and normal.  I will see him back in 6 months and if she remains stable, on annual basis.  No changes in the medications were done today.     Adrian Prows, MD, [facility] 08/27/2021, 2:23 PM Office: [phone] Fax: [phone] Pager: [phone]

## 2021-08-28 ENCOUNTER — Encounter (HOSPITAL_COMMUNITY)
Admission: RE | Admit: 2021-08-28 | Discharge: 2021-08-28 | Disposition: A | Discharge status: Home / Self Care | Payer: Medicare Other | Source: Ambulatory Visit | Attending: Cardiology | Admitting: Cardiology

## 2021-08-28 ENCOUNTER — Other Ambulatory Visit: Payer: Self-pay

## 2021-08-28 DIAGNOSIS — Z953 Presence of xenogenic heart valve: Secondary | ICD-10-CM | POA: Diagnosis not present

## 2021-08-31 ENCOUNTER — Encounter (HOSPITAL_COMMUNITY): Payer: Medicare Other

## 2021-08-31 ENCOUNTER — Telehealth (HOSPITAL_COMMUNITY): Payer: Self-pay | Admitting: Family Medicine

## 2021-09-02 ENCOUNTER — Other Ambulatory Visit: Payer: Self-pay

## 2021-09-02 ENCOUNTER — Encounter (HOSPITAL_COMMUNITY)
Admission: RE | Admit: 2021-09-02 | Discharge: 2021-09-02 | Disposition: A | Discharge status: Home / Self Care | Payer: Medicare Other | Source: Ambulatory Visit | Attending: Cardiology | Admitting: Cardiology

## 2021-09-02 DIAGNOSIS — Z953 Presence of xenogenic heart valve: Secondary | ICD-10-CM | POA: Diagnosis not present

## 2021-09-02 NOTE — Progress Notes (Signed)
Cardiac Individual Treatment Plan  Patient Details  Name: [name] MRN: [id number] Date of Birth: 1951/07/06 Referring Provider:   Flowsheet Row CARDIAC REHAB PHASE II ORIENTATION from 07/30/2021 in [facility]  Referring Provider Adrian Prows, MD       Initial Encounter Date:  [place] PHASE II ORIENTATION from 07/30/2021 in [facility]  Date 07/30/21       Visit Diagnosis: 06/05/21 S/P Aortic Valve Replacement  Patient's Home Medications on Admission:  Current Outpatient Medications:    acetaminophen (TYLENOL) 500 MG tablet, Take 500 mg by mouth every 6 (six) hours as needed for headache., Disp: , Rfl:    albuterol (PROVENTIL HFA;VENTOLIN HFA) 108 (90 Base) MCG/ACT inhaler, Inhale 2 puffs into the lungs every 6 (six) hours as needed. (Patient taking differently: Inhale 2 puffs into the lungs every 6 (six) hours as needed (Asthma).), Disp: 8.5 g, Rfl: 11   albuterol (PROVENTIL) (2.5 MG/3ML) 0.083% nebulizer solution, Take 3 mLs (2.5 mg total) by nebulization every 6 (six) hours as needed. [id number] (Patient taking differently: Take 2.5 mg by nebulization every 6 (six) hours as needed (Asthma). [id number]), Disp: 75 mL, Rfl: 1   ALPRAZolam (XANAX) 1 MG tablet, TAKE ONE-HALF TABLET BY MOUTH ONCE DAILY AS NEEDED FOR STRESS (Patient taking differently: Take 0.5-1 mg by mouth daily as needed for anxiety.), Disp: 90 tablet, Rfl: 0   aspirin (ASPIRIN CHILDRENS) 81 MG chewable tablet, Chew 1 tablet (81 mg total) by mouth daily., Disp: , Rfl:    atorvastatin (LIPITOR) 40 MG tablet, Take 1 tablet (40 mg total) daily by mouth., Disp: 90 tablet, Rfl: 3   augmented betamethasone dipropionate (DIPROLENE-AF) 0.05 % cream, Apply 1 application topically 2 (two) times daily as needed (eczema)., Disp: , Rfl:    Biotin 10000 MCG TABS, Take 10,000 mcg by mouth daily., Disp: , Rfl:    carboxymethylcellulose  (REFRESH PLUS) 0.5 % SOLN, Place 1 drop into both eyes 3 (three) times daily as needed (dry eyes)., Disp: , Rfl:    Cholecalciferol (VITAMIN D) 50 MCG (2000 UT) CAPS, Take 0.5 capsules (1,000 Units total) by mouth daily. (Patient taking differently: Take 2,000 Units by mouth daily.), Disp: , Rfl:    Cyanocobalamin (B-12) 5000 MCG CAPS, Take 5,000 mcg by mouth daily., Disp: , Rfl:    diltiazem (CARDIZEM CD) 240 MG 24 hr capsule, Take 1 capsule (240 mg total) by mouth daily., Disp: 90 capsule, Rfl: 1   famotidine (PEPCID) 20 MG tablet, Take 20 mg by mouth 2 (two) times daily., Disp: , Rfl:    Glucose 15 g PACK, Take 15-30 g by mouth daily as needed (low blood sugar)., Disp: , Rfl:    insulin aspart (NOVOLOG) 100 UNIT/ML injection, Inject into the skin continuous. Via Insulin Pump Uses in pump basal rate 3.0 units per hour, bolus 1 unit per 30 carbs, Disp: , Rfl:    L-Lysine 500 MG CAPS, Take 500 mg by mouth daily., Disp: , Rfl:    levocetirizine (XYZAL ALLERGY 24HR) 5 MG tablet, Take 1 tablet (5 mg total) by mouth every evening., Disp: 30 tablet, Rfl: 0   levothyroxine (SYNTHROID) 125 MCG tablet, Take 125 mcg by mouth daily before breakfast., Disp: , Rfl:    losartan-hydrochlorothiazide (HYZAAR) 100-25 MG tablet, Take 1 tablet by mouth every morning., Disp: 90 tablet, Rfl: 3   Magnesium 500 MG TABS, Take 500 mg by mouth at bedtime., Disp: , Rfl:  Melatonin 10 MG TABS, Take 10 mg by mouth at bedtime., Disp: , Rfl:    omeprazole (PRILOSEC) 40 MG capsule, Take 40 mg by mouth daily., Disp: , Rfl:    OVER THE COUNTER MEDICATION, Take 4 tablets by mouth at bedtime. Focus factor otc supplement, Disp: , Rfl:    Probiotic Product (PROBIOTIC PO), Take 1 capsule by mouth daily. 20 billion active cultures, Disp: , Rfl:    rOPINIRole (REQUIP) 0.5 MG tablet, Take 0.5 mg by mouth at bedtime., Disp: , Rfl:    sertraline (ZOLOFT) 100 MG tablet, Take 100 mg by mouth daily., Disp: , Rfl:    simethicone (GAS-X) 80 MG  chewable tablet, Chew 1 tablet (80 mg total) by mouth 4 (four) times daily as needed for flatulence., Disp: 100 tablet, Rfl: 2  Past Medical History: Past Medical History:  Diagnosis Date   Anxiety    Aortic regurgitation    Arthritis    Asthma    Depression    Depression    Diabetes mellitus    Dyspnea    Facet joint disease of lumbosacral region    Fibromyalgia    GERD (gastroesophageal reflux disease)    Goiter    Hair loss    Heart murmur    Hypertension    Menopause    Mitral regurgitation    Mitral valve prolapse    Other and unspecified hyperlipidemia    Positive PPD    Sleep apnea    Small bowel obstruction ([place]) 02/09/2018   SVT (supraventricular tachycardia) ([place])    Vasculitis (HCC)     Tobacco Use: Social History   Tobacco Use  Smoking Status Former   Packs/day: 0.25   Years: 15.00   Pack years: 3.75   Types: Cigarettes   Quit date: 03/13/1988   Years since quitting: 33.4  Smokeless Tobacco Never    Labs: Recent Review Flowsheet Data     Labs for ITP Cardiac and Pulmonary Rehab Latest Ref Rng & Units 06/05/2021 06/05/2021 06/05/2021 06/05/2021 06/05/2021   Cholestrol <200 mg/dL - - - - -   LDLCALC mg/dL (calc) - - - - -   HDL >50 mg/dL - - - - -   Trlycerides <150 mg/dL - - - - -   Hemoglobin A1c 4.8 - 5.6 % - - - - -   PHART 7.350 - 7.450 7.397 - 7.362 7.297(L) 7.306(L)   PCO2ART 32.0 - 48.0 mmHg 41.1 - 39.5 49.5(H) 55.0(H)   HCO3 20.0 - 28.0 mmol/L 25.3 - 22.8 24.3 27.4   TCO2 22 - 32 mmol/L 26 26 24 26 29    ACIDBASEDEF 0.0 - 2.0 mmol/L - - 3.0(H) 3.0(H) -   O2SAT % 99.0 - 96.0 92.0 92.0       Capillary Blood Glucose: Lab Results  Component Value Date   GLUCAP 156 (H) 08/05/2021   GLUCAP 212 (H) 08/05/2021   GLUCAP 167 (H) 06/13/2021   GLUCAP 195 (H) 06/12/2021   GLUCAP 169 (H) 06/12/2021     Exercise Target Goals: Exercise Program Goal: Individual exercise prescription set using results from initial 6 min walk test and THRR while  considering  patients activity barriers and safety.   Exercise Prescription Goal: Starting with aerobic activity 30 plus minutes a day, 3 days per week for initial exercise prescription. Provide home exercise prescription and guidelines that participant acknowledges understanding prior to discharge.  Activity Barriers & Risk Stratification:  Activity Barriers & Cardiac Risk Stratification - 07/30/21 1437  Activity Barriers & Cardiac Risk Stratification   Activity Barriers Back Problems;Balance Concerns    Cardiac Risk Stratification High             6 Minute Walk:  6 Minute Walk     Row Name 07/30/21 1412         6 Minute Walk   Phase Initial     Distance 1435 feet     Walk Time 6 minutes     # of Rest Breaks 0     MPH 2.72     METS 3.38     RPE 9     Perceived Dyspnea  0     VO2 Peak 11.81     Symptoms No     Resting HR 99 bpm     Resting BP 108/72     Resting Oxygen Saturation  96 %     Exercise Oxygen Saturation  during 6 min walk 95 %     Max Ex. HR 125 bpm     Max Ex. BP 160/64     2 Minute Post BP 134/60              Oxygen Initial Assessment:   Oxygen Re-Evaluation:   Oxygen Discharge (Final Oxygen Re-Evaluation):   Initial Exercise Prescription:  Initial Exercise Prescription - 07/30/21 1400       Date of Initial Exercise RX and Referring Provider   Date 07/30/21    Referring Provider Adrian Prows, MD    Expected Discharge Date 09/25/21      NuStep   Level 2    SPM 75    Minutes 30    METs 2.2      Prescription Details   Frequency (times per week) 3    Duration Progress to 30 minutes of continuous aerobic without signs/symptoms of physical distress      Intensity   THRR 40-80% of Max Heartrate 60-119    Ratings of Perceived Exertion 11-13    Perceived Dyspnea 0-4      Progression   Progression Continue progressive overload as per policy without signs/symptoms or physical distress.      Resistance Training   Training  Prescription Yes    Weight 2 lbs    Reps 10-15             Perform Capillary Blood Glucose checks as needed.  Exercise Prescription Changes:   Exercise Prescription Changes     Row Name 08/05/21 1000 09/02/21 0900           Response to Exercise   Blood Pressure (Admit) 120/64 120/70      Blood Pressure (Exercise) 128/72 138/68      Blood Pressure (Exit) 128/78 122/64      Heart Rate (Admit) 82 bpm 81 bpm      Heart Rate (Exercise) 109 bpm 107 bpm      Heart Rate (Exit) 89 bpm 76 bpm      Oxygen Saturation (Exercise) 96 % --      Rating of Perceived Exertion (Exercise) 11 11      Symptoms None None      Comments Pt's first day in the CRP2 program Reviewed METs, Goals, Home exercise Rx      Duration Continue with 30 min of aerobic exercise without signs/symptoms of physical distress. Continue with 30 min of aerobic exercise without signs/symptoms of physical distress.      Intensity THRR unchanged THRR unchanged  Progression   Progression Continue to progress workloads to maintain intensity without signs/symptoms of physical distress. Continue to progress workloads to maintain intensity without signs/symptoms of physical distress.      Average METs 2.6 3.1        Resistance Training   Training Prescription No No      Weight No weights on wednesdays No weights on wednesdays        Interval Training   Interval Training No No        NuStep   Level 2 3      SPM 85 100      Minutes 30 30      METs 2.6 3.1        Home Exercise Plan   Plans to continue exercise at -- Home (comment)      Frequency -- Add 2 additional days to program exercise sessions.      Initial Home Exercises Provided -- 09/02/21               Exercise Comments:   Exercise Comments     Row Name 08/05/21 1106 09/02/21 1001         Exercise Comments Pt's first day in the CRP2 program. Pt tolerated the session well today with no comaplaints. Reviewed METs, goals and home exercise Rx.  Pt achieves 150+ minutres of exercise per week which is at goal. Pt will continue to walk at home 2x/week for 30 minutes in addtion to 3x/week for the CRP2 program. Pt verbalized understanding of the home exercise Rx and was provided a copy.               Exercise Goals and Review:   Exercise Goals     Row Name 07/30/21 1437             Exercise Goals   Increase Physical Activity Yes       Intervention Provide advice, education, support and counseling about physical activity/exercise needs.;Develop an individualized exercise prescription for aerobic and resistive training based on initial evaluation findings, risk stratification, comorbidities and participant's personal goals.       Expected Outcomes Short Term: Attend rehab on a regular basis to increase amount of physical activity.;Long Term: Add in home exercise to make exercise part of routine and to increase amount of physical activity.;Long Term: Exercising regularly at least 3-5 days a week.       Increase Strength and Stamina Yes       Intervention Provide advice, education, support and counseling about physical activity/exercise needs.;Develop an individualized exercise prescription for aerobic and resistive training based on initial evaluation findings, risk stratification, comorbidities and participant's personal goals.       Expected Outcomes Short Term: Increase workloads from initial exercise prescription for resistance, speed, and METs.;Short Term: Perform resistance training exercises routinely during rehab and add in resistance training at home;Long Term: Improve cardiorespiratory fitness, muscular endurance and strength as measured by increased METs and functional capacity (6MWT)       Able to understand and use rate of perceived exertion (RPE) scale Yes       Intervention Provide education and explanation on how to use RPE scale       Expected Outcomes Short Term: Able to use RPE daily in rehab to express subjective  intensity level;Long Term:  Able to use RPE to guide intensity level when exercising independently       Knowledge and understanding of Target Heart Rate Range (THRR) Yes  Intervention Provide education and explanation of THRR including how the numbers were predicted and where they are located for reference       Expected Outcomes Short Term: Able to state/look up THRR;Short Term: Able to use daily as guideline for intensity in rehab;Long Term: Able to use THRR to govern intensity when exercising independently       Understanding of Exercise Prescription Yes       Intervention Provide education, explanation, and written materials on patient's individual exercise prescription       Expected Outcomes Short Term: Able to explain program exercise prescription;Long Term: Able to explain home exercise prescription to exercise independently                Exercise Goals Re-Evaluation :  Exercise Goals Re-Evaluation     Row Name 08/05/21 1104 09/02/21 0957           Exercise Goal Re-Evaluation   Exercise Goals Review Increase Physical Activity;Increase Strength and Stamina;Able to understand and use rate of perceived exertion (RPE) scale;Knowledge and understanding of Target Heart Rate Range (THRR);Understanding of Exercise Prescription Increase Physical Activity;Increase Strength and Stamina;Able to understand and use rate of perceived exertion (RPE) scale;Knowledge and understanding of Target Heart Rate Range (THRR);Understanding of Exercise Prescription;Able to check pulse independently      Comments Pt's first day in the CRP2 program. Pt understands the exercise Rx, THRR, and RPE scale. Reviewed METs, Goals anfd home exercise Rx. Pt voices progressing to goal of having more strength and stamina. Pt has noted improvement since beginning the CRP2 program. Pt has not had significant change in her body weight as weight loss is her other goal. Reviewed home exercise Rx. Pt currently walks 2x/week  at home on her treadmill for 30 minutes.      Expected Outcomes Will continue to monitor patient and progress exercise workloads as tolerated. Pt will continue to exercise at home 2x/week in addtion to the CRP2 program.                Discharge Exercise Prescription (Final Exercise Prescription Changes):  Exercise Prescription Changes - 09/02/21 0900       Response to Exercise   Blood Pressure (Admit) 120/70    Blood Pressure (Exercise) 138/68    Blood Pressure (Exit) 122/64    Heart Rate (Admit) 81 bpm    Heart Rate (Exercise) 107 bpm    Heart Rate (Exit) 76 bpm    Rating of Perceived Exertion (Exercise) 11    Symptoms None    Comments Reviewed METs, Goals, Home exercise Rx    Duration Continue with 30 min of aerobic exercise without signs/symptoms of physical distress.    Intensity THRR unchanged      Progression   Progression Continue to progress workloads to maintain intensity without signs/symptoms of physical distress.    Average METs 3.1      Resistance Training   Training Prescription No    Weight No weights on wednesdays      Interval Training   Interval Training No      NuStep   Level 3    SPM 100    Minutes 30    METs 3.1      Home Exercise Plan   Plans to continue exercise at Home (comment)    Frequency Add 2 additional days to program exercise sessions.    Initial Home Exercises Provided 09/02/21             Nutrition:  Target  Goals: Understanding of nutrition guidelines, daily intake of sodium 1500mg , cholesterol 200mg , calories 30% from fat and 7% or less from saturated fats, daily to have 5 or more servings of fruits and vegetables.  Biometrics:  Pre Biometrics - 07/30/21 1413       Pre Biometrics   Waist Circumference 39.5 inches    Hip Circumference 44 inches    Waist to Hip Ratio 0.9 %    Triceps Skinfold 28 mm    % Body Fat 42.4 %    Grip Strength 20 kg    Flexibility 12.25 in    Single Leg Stand 6.62 seconds               Nutrition Therapy Plan and Nutrition Goals:   Nutrition Assessments:  MEDIFICTS Score Key: ?70 Need to make dietary changes  40-70 Heart Healthy Diet ? 40 Therapeutic Level Cholesterol Diet   Picture Your Plate Scores: <42 Unhealthy dietary pattern with much room for improvement. 41-50 Dietary pattern unlikely to meet recommendations for good health and room for improvement. 51-60 More healthful dietary pattern, with some room for improvement.  >60 Healthy dietary pattern, although there may be some specific behaviors that could be improved.    Nutrition Goals Re-Evaluation:   Nutrition Goals Discharge (Final Nutrition Goals Re-Evaluation):   Psychosocial: Target Goals: Acknowledge presence or absence of significant depression and/or stress, maximize coping skills, provide positive support system. Participant is able to verbalize types and ability to use techniques and skills needed for reducing stress and depression.  Initial Review & Psychosocial Screening:  Initial Psych Review & Screening - 07/30/21 1446       Initial Review   Current issues with History of Depression   History of Anxiety     Family Dynamics   Good Support System? Yes   Neosha has her husband and her church family for support   Comments Domineque has a history of depression and is taking an antidepressant. Richella says the her depression is currently controlled at this time.      Barriers   Psychosocial barriers to participate in program There are no identifiable barriers or psychosocial needs.      Screening Interventions   Interventions Encouraged to exercise             Quality of Life Scores:  Quality of Life - 07/30/21 1429       Quality of Life   Select Quality of Life      Quality of Life Scores   Health/Function Pre 24.4 %    Socioeconomic Pre 26.64 %    Psych/Spiritual Pre 17.14 %    Family Pre 28.8 %    GLOBAL Pre 24.01 %            Scores of 19 and below  usually indicate a poorer quality of life in these areas.  A difference of  2-3 points is a clinically meaningful difference.  A difference of 2-3 points in the total score of the Quality of Life Index has been associated with significant improvement in overall quality of life, self-image, physical symptoms, and general health in studies assessing change in quality of life.  PHQ-9: Recent Review Flowsheet Data     Depression screen [facility] 2/9 07/30/2021 06/30/2017 03/04/2016 08/28/2015 03/25/2015   Decreased Interest 0 1 3 2  0   Down, Depressed, Hopeless 0 2 - 0 0   PHQ - 2 Score 0 3 3 2  0   Altered sleeping - - 2  2 -   Tired, decreased energy - 3 3 3  -   Change in appetite - 3 3 3  -   Feeling bad or failure about yourself  - 3 2 3  -   Trouble concentrating - 2 0 0 -   Moving slowly or fidgety/restless - 2 0 0 -   Suicidal thoughts - 0 0 0 -   PHQ-9 Score - - 13 13 -   Difficult doing work/chores - Somewhat difficult - - -      Interpretation of Total Score  Total Score Depression Severity:  1-4 = Minimal depression, 5-9 = Mild depression, 10-14 = Moderate depression, 15-19 = Moderately severe depression, 20-27 = Severe depression   Psychosocial Evaluation and Intervention:   Psychosocial Re-Evaluation:  Psychosocial Re-Evaluation     Row Name 08/12/21 0949 09/02/21 0930           Psychosocial Re-Evaluation   Current issues with History of Depression History of Depression      Comments Reviewed quality of life questionnaire with Madelline today. No increased cocerns or stressors voiced Tawnya says she has ongoing stress and depression. Dr Collin's office called. Natalyah is interested in receving counselling.      Expected Outcomes Caralena will have continued controlled depression upon completion of phase 2 cardiac rehab. Rhaelyn will have continued controlled depression upon completion of phase 2 cardiac rehab.      Interventions Stress management education;Encouraged to attend  Cardiac Rehabilitation for the exercise Stress management education;Encouraged to attend Cardiac Rehabilitation for the exercise      Continue Psychosocial Services  Follow up required by counselor Follow up required by staff               Psychosocial Discharge (Final Psychosocial Re-Evaluation):  Psychosocial Re-Evaluation - 09/02/21 0930       Psychosocial Re-Evaluation   Current issues with History of Depression    Comments Aniylah says she has ongoing stress and depression. Dr Collin's office called. Jaycie is interested in receving counselling.    Expected Outcomes Laionna will have continued controlled depression upon completion of phase 2 cardiac rehab.    Interventions Stress management education;Encouraged to attend Cardiac Rehabilitation for the exercise    Continue Psychosocial Services  Follow up required by staff             Vocational Rehabilitation: Provide vocational rehab assistance to qualifying candidates.   Vocational Rehab Evaluation & Intervention:  Vocational Rehab - 07/30/21 1451       Initial Vocational Rehab Evaluation & Intervention   Assessment shows need for Vocational Rehabilitation No   Erie is retired and does not need vocational rehab at this time.            Education: Education Goals: Education classes will be provided on a weekly basis, covering required topics. Participant will state understanding/return demonstration of topics presented.  Learning Barriers/Preferences:  Learning Barriers/Preferences - 07/30/21 1431       Learning Barriers/Preferences   Learning Barriers Sight   wears glasses   Learning Preferences Audio;Written Material;Computer/Internet;Group Instruction;Individual Instruction;Pictoral;Skilled Demonstration;Verbal Instruction;Video             Education Topics: Hypertension, Hypertension Reduction -Define heart disease and high blood pressure. Discus how high blood pressure affects the body and  ways to reduce high blood pressure.   Exercise and Your Heart -Discuss why it is important to exercise, the FITT principles of exercise, normal and abnormal responses to exercise, and how to exercise safely.  Angina -Discuss definition of angina, causes of angina, treatment of angina, and how to decrease risk of having angina.   Cardiac Medications -Review what the following cardiac medications are used for, how they affect the body, and side effects that may occur when taking the medications.  Medications include Aspirin, Beta blockers, calcium channel blockers, ACE Inhibitors, angiotensin receptor blockers, diuretics, digoxin, and antihyperlipidemics.   Congestive Heart Failure -Discuss the definition of CHF, how to live with CHF, the signs and symptoms of CHF, and how keep track of weight and sodium intake.   Heart Disease and Intimacy -Discus the effect sexual activity has on the heart, how changes occur during intimacy as we age, and safety during sexual activity.   Smoking Cessation / COPD -Discuss different methods to quit smoking, the health benefits of quitting smoking, and the definition of COPD.   Nutrition I: Fats -Discuss the types of cholesterol, what cholesterol does to the heart, and how cholesterol levels can be controlled.   Nutrition II: Labels -Discuss the different components of food labels and how to read food label   Heart Parts/Heart Disease and PAD -Discuss the anatomy of the heart, the pathway of blood circulation through the heart, and these are affected by heart disease.   Stress I: Signs and Symptoms -Discuss the causes of stress, how stress may lead to anxiety and depression, and ways to limit stress.   Stress II: Relaxation -Discuss different types of relaxation techniques to limit stress.   Warning Signs of Stroke / TIA -Discuss definition of a stroke, what the signs and symptoms are of a stroke, and how to identify when someone is  having stroke.   Knowledge Questionnaire Score:  Knowledge Questionnaire Score - 07/30/21 1421       Knowledge Questionnaire Score   Pre Score 21/24             Core Components/Risk Factors/Patient Goals at Admission:  Personal Goals and Risk Factors at Admission - 07/30/21 1429       Core Components/Risk Factors/Patient Goals on Admission    Weight Management Yes;Obesity;Weight Loss    Intervention Weight Management: Develop a combined nutrition and exercise program designed to reach desired caloric intake, while maintaining appropriate intake of nutrient and fiber, sodium and fats, and appropriate energy expenditure required for the weight goal.;Weight Management: Provide education and appropriate resources to help participant work on and attain dietary goals.;Weight Management/Obesity: Establish reasonable short term and long term weight goals.;Obesity: Provide education and appropriate resources to help participant work on and attain dietary goals.    Admit Weight 170 lb 13.7 oz (77.5 kg)    Expected Outcomes Short Term: Continue to assess and modify interventions until short term weight is achieved;Long Term: Adherence to nutrition and physical activity/exercise program aimed toward attainment of established weight goal;Weight Maintenance: Understanding of the daily nutrition guidelines, which includes 25-35% calories from fat, 7% or less cal from saturated fats, less than 200mg  cholesterol, less than 1.5gm of sodium, & 5 or more servings of fruits and vegetables daily;Weight Loss: Understanding of general recommendations for a balanced deficit meal plan, which promotes 1-2 lb weight loss per week and includes a negative energy balance of [phone] kcal/d;Understanding recommendations for meals to include 15-35% energy as protein, 25-35% energy from fat, 35-60% energy from carbohydrates, less than 200mg  of dietary cholesterol, 20-35 gm of total fiber daily;Understanding of distribution of  calorie intake throughout the day with the consumption of 4-5 meals/snacks    Diabetes Yes  Intervention Provide education about signs/symptoms and action to take for hypo/hyperglycemia.;Provide education about proper nutrition, including hydration, and aerobic/resistive exercise prescription along with prescribed medications to achieve blood glucose in normal ranges: Fasting glucose 65-99 mg/dL    Expected Outcomes Short Term: Participant verbalizes understanding of the signs/symptoms and immediate care of hyper/hypoglycemia, proper foot care and importance of medication, aerobic/resistive exercise and nutrition plan for blood glucose control.;Long Term: Attainment of HbA1C < 7%.    Hypertension Yes    Intervention Provide education on lifestyle modifcations including regular physical activity/exercise, weight management, moderate sodium restriction and increased consumption of fresh fruit, vegetables, and low fat dairy, alcohol moderation, and smoking cessation.;Monitor prescription use compliance.    Expected Outcomes Short Term: Continued assessment and intervention until BP is < 140/58mm HG in hypertensive participants. < 130/13mm HG in hypertensive participants with diabetes, heart failure or chronic kidney disease.;Long Term: Maintenance of blood pressure at goal levels.    Lipids Yes    Intervention Provide education and support for participant on nutrition & aerobic/resistive exercise along with prescribed medications to achieve LDL 70mg , HDL >40mg .    Expected Outcomes Short Term: Participant states understanding of desired cholesterol values and is compliant with medications prescribed. Participant is following exercise prescription and nutrition guidelines.;Long Term: Cholesterol controlled with medications as prescribed, with individualized exercise RX and with personalized nutrition plan. Value goals: LDL < 70mg , HDL > 40 mg.             Core Components/Risk Factors/Patient Goals  Review:   Goals and Risk Factor Review     Row Name 08/05/21 1007 08/12/21 0955           Core Components/Risk Factors/Patient Goals Review   Personal Goals Review Weight Management/Obesity;Hypertension;Lipids;Diabetes Weight Management/Obesity;Hypertension;Lipids;Diabetes;Stress      Review Lovie started exercise at cardiac rehab on 08/05/21 and did well with exercise. Vital signs and CBG's were stable. Wandra is off to good start to exercise at phase 2 cardiac rehab. Vital signs and CBG's have been stable.      Expected Outcomes Aubriee will continue to participate in phase 2 cardiac rehab for exercise, nutrition and lifestyle modifications. Zimal will continue to participate in phase 2 cardiac rehab for exercise, nutrition and lifestyle modifications               Core Components/Risk Factors/Patient Goals at Discharge (Final Review):   Goals and Risk Factor Review - 08/12/21 0955       Core Components/Risk Factors/Patient Goals Review   Personal Goals Review Weight Management/Obesity;Hypertension;Lipids;Diabetes;Stress    Review Arrabella is off to good start to exercise at phase 2 cardiac rehab. Vital signs and CBG's have been stable.    Expected Outcomes Tynia will continue to participate in phase 2 cardiac rehab for exercise, nutrition and lifestyle modifications             ITP Comments:  ITP Comments     Row Name 07/30/21 1444 08/05/21 1004 08/12/21 0948 09/02/21 0929     ITP Comments Dr 09/04/21 MD, Medical Director 30 Day ITP Review. Lynsey started cardiac rehab on 08/05/21 and did well with exercise. 30 Day ITP Review. Trenda has good attendance and participation in phase 2 cardiac rehab. Shanee is off to a good start to exercise. 30 Day ITP Review. Lindsea has good attendance and participation in phase 2 cardiac rehab.             Comments: See ITP comments.Samara Deist RN BSN

## 2021-09-02 NOTE — Progress Notes (Signed)
Reviewed home exercise Rx. Pt will continue to use her treadmill 2 x/week for 30 minutes. Encouraged warm-up, cool-down and stretching. Hydration stressed and encouraged. Reviewed THRR of 60-134 and keeping RPE between 11-13, Reviewed weather parameters for temperature and humidity for safe exercise outdoors. Reviewed S/S that would require patient to terminate exercise and when to call MD vs 911. Stressed that pt should carry cell phone if exercising outdoors. Also stress to pt that she should know her blood sugar before beginning exercise, as well as, carry rapid acting sources of glucose. Pt verbalized understanding of the home exercise Rx and was provided a copy.   Lorin Picket MS, ACSM-CEP, CCRP

## 2021-09-02 NOTE — Progress Notes (Signed)
[name] says she has ongoing depression and stress and is interested in counseling. Will contact Dr France Ravens office to facilitate this.Gladstone Lighter, RN,BSN 09/02/2021 9:50 AM

## 2021-09-04 ENCOUNTER — Encounter (HOSPITAL_COMMUNITY)
Admission: RE | Admit: 2021-09-04 | Discharge: 2021-09-04 | Disposition: A | Discharge status: Home / Self Care | Payer: Medicare Other | Source: Ambulatory Visit | Attending: Cardiology | Admitting: Cardiology

## 2021-09-04 ENCOUNTER — Other Ambulatory Visit: Payer: Self-pay

## 2021-09-04 DIAGNOSIS — Z953 Presence of xenogenic heart valve: Secondary | ICD-10-CM | POA: Diagnosis not present

## 2021-09-09 ENCOUNTER — Other Ambulatory Visit: Payer: Self-pay

## 2021-09-09 ENCOUNTER — Encounter (HOSPITAL_COMMUNITY)
Admission: RE | Admit: 2021-09-09 | Discharge: 2021-09-09 | Disposition: A | Discharge status: Home / Self Care | Payer: Medicare Other | Source: Ambulatory Visit | Attending: Cardiology | Admitting: Cardiology

## 2021-09-09 DIAGNOSIS — Z953 Presence of xenogenic heart valve: Secondary | ICD-10-CM

## 2021-09-11 ENCOUNTER — Encounter (HOSPITAL_COMMUNITY): Payer: Medicare Other

## 2021-09-11 DIAGNOSIS — I10 Essential (primary) hypertension: Secondary | ICD-10-CM | POA: Diagnosis not present

## 2021-09-11 DIAGNOSIS — E7800 Pure hypercholesterolemia, unspecified: Secondary | ICD-10-CM | POA: Diagnosis not present

## 2021-09-11 DIAGNOSIS — K219 Gastro-esophageal reflux disease without esophagitis: Secondary | ICD-10-CM | POA: Diagnosis not present

## 2021-09-11 DIAGNOSIS — E1065 Type 1 diabetes mellitus with hyperglycemia: Secondary | ICD-10-CM | POA: Diagnosis not present

## 2021-09-16 ENCOUNTER — Other Ambulatory Visit: Payer: Self-pay

## 2021-09-16 ENCOUNTER — Encounter (HOSPITAL_COMMUNITY)
Admission: RE | Admit: 2021-09-16 | Discharge: 2021-09-16 | Disposition: A | Discharge status: Home / Self Care | Payer: Medicare Other | Source: Ambulatory Visit | Attending: Cardiology | Admitting: Cardiology

## 2021-09-16 DIAGNOSIS — Z48812 Encounter for surgical aftercare following surgery on the circulatory system: Secondary | ICD-10-CM | POA: Insufficient documentation

## 2021-09-16 DIAGNOSIS — Z953 Presence of xenogenic heart valve: Secondary | ICD-10-CM | POA: Diagnosis not present

## 2021-09-18 ENCOUNTER — Encounter (HOSPITAL_COMMUNITY)
Admission: RE | Admit: 2021-09-18 | Discharge: 2021-09-18 | Disposition: A | Discharge status: Home / Self Care | Payer: Medicare Other | Source: Ambulatory Visit | Attending: Cardiology | Admitting: Cardiology

## 2021-09-18 ENCOUNTER — Other Ambulatory Visit: Payer: Self-pay

## 2021-09-18 DIAGNOSIS — Z953 Presence of xenogenic heart valve: Secondary | ICD-10-CM

## 2021-09-18 DIAGNOSIS — Z48812 Encounter for surgical aftercare following surgery on the circulatory system: Secondary | ICD-10-CM | POA: Diagnosis not present

## 2021-09-21 ENCOUNTER — Encounter (HOSPITAL_COMMUNITY)
Admission: RE | Admit: 2021-09-21 | Discharge: 2021-09-21 | Disposition: A | Discharge status: Home / Self Care | Payer: Medicare Other | Source: Ambulatory Visit | Attending: Cardiology | Admitting: Cardiology

## 2021-09-21 ENCOUNTER — Other Ambulatory Visit: Payer: Self-pay

## 2021-09-21 DIAGNOSIS — Z953 Presence of xenogenic heart valve: Secondary | ICD-10-CM | POA: Diagnosis not present

## 2021-09-21 DIAGNOSIS — Z48812 Encounter for surgical aftercare following surgery on the circulatory system: Secondary | ICD-10-CM | POA: Diagnosis not present

## 2021-09-23 ENCOUNTER — Telehealth: Payer: Self-pay | Admitting: Adult Health

## 2021-09-23 ENCOUNTER — Other Ambulatory Visit: Payer: Self-pay

## 2021-09-23 ENCOUNTER — Encounter (HOSPITAL_COMMUNITY)
Admission: RE | Admit: 2021-09-23 | Discharge: 2021-09-23 | Disposition: A | Discharge status: Home / Self Care | Payer: Medicare Other | Source: Ambulatory Visit | Attending: Cardiology | Admitting: Cardiology

## 2021-09-23 DIAGNOSIS — Z953 Presence of xenogenic heart valve: Secondary | ICD-10-CM | POA: Diagnosis not present

## 2021-09-23 DIAGNOSIS — G4733 Obstructive sleep apnea (adult) (pediatric): Secondary | ICD-10-CM

## 2021-09-23 DIAGNOSIS — Z9989 Dependence on other enabling machines and devices: Secondary | ICD-10-CM

## 2021-09-23 DIAGNOSIS — Z48812 Encounter for surgical aftercare following surgery on the circulatory system: Secondary | ICD-10-CM | POA: Diagnosis not present

## 2021-09-23 NOTE — Telephone Encounter (Signed)
Please let patent know we need to repeat HST before issue new machine.

## 2021-09-23 NOTE — Telephone Encounter (Signed)
You saw pt 05-06-21, her SS done 10-31-2014.  DME is [facility].  Pt machine exceeded life.  [facility] is asking for another RX.

## 2021-09-23 NOTE — Telephone Encounter (Addendum)
Spoke to patient this evening. Patient has agreed to do another HST. Will make Elly Modena, NP aware

## 2021-09-23 NOTE — Telephone Encounter (Signed)
Pt has called to report she has a message on her CPAP:Message on CPAP states motor has exceeded it's life.  [facility] is asking for a Rx for a new CPAP

## 2021-09-24 ENCOUNTER — Ambulatory Visit: Payer: Medicare Other

## 2021-09-24 DIAGNOSIS — I6522 Occlusion and stenosis of left carotid artery: Secondary | ICD-10-CM

## 2021-09-24 DIAGNOSIS — R0989 Other specified symptoms and signs involving the circulatory and respiratory systems: Secondary | ICD-10-CM | POA: Diagnosis not present

## 2021-09-24 NOTE — Addendum Note (Signed)
Addended by: Enedina Finner on: 09/24/2021 11:09 AM   Modules accepted: Orders

## 2021-09-25 ENCOUNTER — Telehealth: Payer: Self-pay

## 2021-09-25 ENCOUNTER — Other Ambulatory Visit: Payer: Self-pay

## 2021-09-25 ENCOUNTER — Encounter (HOSPITAL_COMMUNITY)
Admission: RE | Admit: 2021-09-25 | Discharge: 2021-09-25 | Disposition: A | Discharge status: Home / Self Care | Payer: Medicare Other | Source: Ambulatory Visit | Attending: Cardiology | Admitting: Cardiology

## 2021-09-25 DIAGNOSIS — Z953 Presence of xenogenic heart valve: Secondary | ICD-10-CM | POA: Diagnosis not present

## 2021-09-25 DIAGNOSIS — Z48812 Encounter for surgical aftercare following surgery on the circulatory system: Secondary | ICD-10-CM | POA: Diagnosis not present

## 2021-09-25 NOTE — Telephone Encounter (Signed)
As advised by Dr. Ganji please add her to the schedule sometime in the next 2 weeks.

## 2021-09-25 NOTE — Telephone Encounter (Signed)
Please talk to the patient and advise her to restrict excess fluids, to restrict salt and to cut down on the calories even more until she sees me

## 2021-09-25 NOTE — Telephone Encounter (Signed)
As advised by Dr. Jacinto Halim please add her to the schedule sometime in the next 2 weeks.

## 2021-09-25 NOTE — Progress Notes (Signed)
Barbera had  an asthma attack this morning. Doing well with exercise. Given diabetic diet packet. Oxygen saturation 94% on room air presently. Shanai exercised this morning without difficulty. Oxygen saturations ranged between 93-97% on room air. Upon assessment lung fields essentially clear. Bilateral ankle edema present greater on the right. Weight today noted at 80.9 kg. Weight at orientation noted at 77.5 kg. Verlon Au denies being short of breath presently. Orena has gained 3.4 / kg / 7.48 lbs since starting cardiac rehab. Dr Jodi Marble office called and notified about weight gain spoke with Cathlean Cower. Encouraged Teresa to watch her portion sizes and to not add salt to her food.Will fax exercise flow sheets to Dr. Verl Dicker  office for review. Will continue to monitor the patient throughout  the program.Amron Guerrette Harlon Flor, RN,BSN 09/25/2021 10:00 AM

## 2021-09-28 ENCOUNTER — Telehealth: Payer: Self-pay

## 2021-09-28 ENCOUNTER — Other Ambulatory Visit: Payer: Self-pay

## 2021-09-28 ENCOUNTER — Encounter (HOSPITAL_COMMUNITY)
Admission: RE | Admit: 2021-09-28 | Discharge: 2021-09-28 | Disposition: A | Discharge status: Home / Self Care | Payer: Medicare Other | Source: Ambulatory Visit | Attending: Cardiology | Admitting: Cardiology

## 2021-09-28 DIAGNOSIS — Z953 Presence of xenogenic heart valve: Secondary | ICD-10-CM | POA: Diagnosis not present

## 2021-09-28 DIAGNOSIS — Z48812 Encounter for surgical aftercare following surgery on the circulatory system: Secondary | ICD-10-CM | POA: Diagnosis not present

## 2021-09-28 NOTE — Progress Notes (Signed)
Weight today is 81.4 kg.  [name] reports feeing a little short of breath today more so than usual. Upon assessment lung fields with scattered expiratory wheezes right and left posterior fields. [name] continues to have bilateral ankle edema. [name]'s face looks a little puffy. [name] says she is keeping her car count to 45 grams per meal. Oxygen saturations 93-95% on room air. Dr Verl Dicker office called and notified about patient's continued weight gain. Spoke with [employer] [name] exercised without difficulty today. Will fax  today's exercise flow sheets to Dr. Verl Dicker office for review. [name]'s weight is up 0.5 kg from Friday 09/25/21.Ashley Lighter, RN,BSN 09/28/2021 10:12 AM

## 2021-09-28 NOTE — Telephone Encounter (Signed)
Maria from cardiac rehab stated that patient is in with her now and has noticed that patient's face if puffy and that her weight has continued to go up. Also her ankles are still very swollen. Patient has complaints of SOB  and Byrd Hesselbach also stated that she can hear wheezing that she did not hear on Friday, on the patient.  (A message was sent Friday, but Byrd Hesselbach has concerns about weight still going up and SOB)

## 2021-09-29 NOTE — Progress Notes (Signed)
Cardiac Individual Treatment Plan  Patient Details  Name: PHARAH TINO MRN: [id number] Date of Birth: 08-09-1951 Referring Provider:   Flowsheet Row CARDIAC REHAB PHASE II ORIENTATION from 07/30/2021 in [facility]  Referring Provider Adrian Prows, MD       Initial Encounter Date:  [place] PHASE II ORIENTATION from 07/30/2021 in [place]  Date 07/30/21       Visit Diagnosis: 06/05/21 S/P Aortic Valve Replacement  Patient's Home Medications on Admission:  Current Outpatient Medications:    acetaminophen (TYLENOL) 500 MG tablet, Take 500 mg by mouth every 6 (six) hours as needed for headache., Disp: , Rfl:    albuterol (PROVENTIL HFA;VENTOLIN HFA) 108 (90 Base) MCG/ACT inhaler, Inhale 2 puffs into the lungs every 6 (six) hours as needed. (Patient taking differently: Inhale 2 puffs into the lungs every 6 (six) hours as needed (Asthma).), Disp: 8.5 g, Rfl: 11   albuterol (PROVENTIL) (2.5 MG/3ML) 0.083% nebulizer solution, Take 3 mLs (2.5 mg total) by nebulization every 6 (six) hours as needed. [id number] (Patient taking differently: Take 2.5 mg by nebulization every 6 (six) hours as needed (Asthma). [id number]), Disp: 75 mL, Rfl: 1   ALPRAZolam (XANAX) 1 MG tablet, TAKE ONE-HALF TABLET BY MOUTH ONCE DAILY AS NEEDED FOR STRESS (Patient taking differently: Take 0.5-1 mg by mouth daily as needed for anxiety.), Disp: 90 tablet, Rfl: 0   aspirin (ASPIRIN CHILDRENS) 81 MG chewable tablet, Chew 1 tablet (81 mg total) by mouth daily., Disp: , Rfl:    atorvastatin (LIPITOR) 40 MG tablet, Take 1 tablet (40 mg total) daily by mouth., Disp: 90 tablet, Rfl: 3   augmented betamethasone dipropionate (DIPROLENE-AF) 0.05 % cream, Apply 1 application topically 2 (two) times daily as needed (eczema)., Disp: , Rfl:    Biotin 10000 MCG TABS, Take 10,000 mcg by mouth daily., Disp: , Rfl:    carboxymethylcellulose  (REFRESH PLUS) 0.5 % SOLN, Place 1 drop into both eyes 3 (three) times daily as needed (dry eyes)., Disp: , Rfl:    Cholecalciferol (VITAMIN D) 50 MCG (2000 UT) CAPS, Take 0.5 capsules (1,000 Units total) by mouth daily. (Patient taking differently: Take 2,000 Units by mouth daily.), Disp: , Rfl:    Cyanocobalamin (B-12) 5000 MCG CAPS, Take 5,000 mcg by mouth daily., Disp: , Rfl:    diltiazem (CARDIZEM CD) 240 MG 24 hr capsule, Take 1 capsule (240 mg total) by mouth daily., Disp: 90 capsule, Rfl: 1   famotidine (PEPCID) 20 MG tablet, Take 20 mg by mouth 2 (two) times daily., Disp: , Rfl:    Glucose 15 g PACK, Take 15-30 g by mouth daily as needed (low blood sugar)., Disp: , Rfl:    insulin aspart (NOVOLOG) 100 UNIT/ML injection, Inject into the skin continuous. Via Insulin Pump Uses in pump basal rate 3.0 units per hour, bolus 1 unit per 30 carbs, Disp: , Rfl:    L-Lysine 500 MG CAPS, Take 500 mg by mouth daily., Disp: , Rfl:    levocetirizine (XYZAL ALLERGY 24HR) 5 MG tablet, Take 1 tablet (5 mg total) by mouth every evening., Disp: 30 tablet, Rfl: 0   levothyroxine (SYNTHROID) 125 MCG tablet, Take 125 mcg by mouth daily before breakfast., Disp: , Rfl:    losartan-hydrochlorothiazide (HYZAAR) 100-25 MG tablet, Take 1 tablet by mouth every morning., Disp: 90 tablet, Rfl: 3   Magnesium 500 MG TABS, Take 500 mg by mouth at bedtime., Disp: , Rfl:  Melatonin 10 MG TABS, Take 10 mg by mouth at bedtime., Disp: , Rfl:    omeprazole (PRILOSEC) 40 MG capsule, Take 40 mg by mouth daily., Disp: , Rfl:    OVER THE COUNTER MEDICATION, Take 4 tablets by mouth at bedtime. Focus factor otc supplement, Disp: , Rfl:    Probiotic Product (PROBIOTIC PO), Take 1 capsule by mouth daily. 20 billion active cultures, Disp: , Rfl:    rOPINIRole (REQUIP) 0.5 MG tablet, Take 0.5 mg by mouth at bedtime., Disp: , Rfl:    sertraline (ZOLOFT) 100 MG tablet, Take 100 mg by mouth daily., Disp: , Rfl:    simethicone (GAS-X) 80 MG  chewable tablet, Chew 1 tablet (80 mg total) by mouth 4 (four) times daily as needed for flatulence., Disp: 100 tablet, Rfl: 2  Past Medical History: Past Medical History:  Diagnosis Date   Anxiety    Aortic regurgitation    Arthritis    Asthma    Depression    Depression    Diabetes mellitus    Dyspnea    Facet joint disease of lumbosacral region    Fibromyalgia    GERD (gastroesophageal reflux disease)    Goiter    Hair loss    Heart murmur    Hypertension    Menopause    Mitral regurgitation    Mitral valve prolapse    Other and unspecified hyperlipidemia    Positive PPD    Sleep apnea    Small bowel obstruction ([place]) 02/09/2018   SVT (supraventricular tachycardia) ([place])    Vasculitis ([facility])     Tobacco Use: Social History   Tobacco Use  Smoking Status Former   Packs/day: 0.25   Years: 15.00   Pack years: 3.75   Types: Cigarettes   Quit date: 03/13/1988   Years since quitting: 33.5  Smokeless Tobacco Never    Labs: Recent Review Flowsheet Data     Labs for ITP Cardiac and Pulmonary Rehab Latest Ref Rng & Units 06/05/2021 06/05/2021 06/05/2021 06/05/2021 06/05/2021   Cholestrol <200 mg/dL - - - - -   LDLCALC mg/dL (calc) - - - - -   HDL >50 mg/dL - - - - -   Trlycerides <150 mg/dL - - - - -   Hemoglobin A1c 4.8 - 5.6 % - - - - -   PHART 7.350 - 7.450 7.397 - 7.362 7.297(L) 7.306(L)   PCO2ART 32.0 - 48.0 mmHg 41.1 - 39.5 49.5(H) 55.0(H)   HCO3 20.0 - 28.0 mmol/L 25.3 - 22.8 24.3 27.4   TCO2 22 - 32 mmol/L 26 26 24 26 29    ACIDBASEDEF 0.0 - 2.0 mmol/L - - 3.0(H) 3.0(H) -   O2SAT % 99.0 - 96.0 92.0 92.0       Capillary Blood Glucose: Lab Results  Component Value Date   GLUCAP 156 (H) 08/05/2021   GLUCAP 212 (H) 08/05/2021   GLUCAP 167 (H) 06/13/2021   GLUCAP 195 (H) 06/12/2021   GLUCAP 169 (H) 06/12/2021     Exercise Target Goals: Exercise Program Goal: Individual exercise prescription set using results from initial 6 min walk test and THRR while  considering  patients activity barriers and safety.   Exercise Prescription Goal: Starting with aerobic activity 30 plus minutes a day, 3 days per week for initial exercise prescription. Provide home exercise prescription and guidelines that participant acknowledges understanding prior to discharge.  Activity Barriers & Risk Stratification:  Activity Barriers & Cardiac Risk Stratification - 07/30/21 1437  Activity Barriers & Cardiac Risk Stratification   Activity Barriers Back Problems;Balance Concerns    Cardiac Risk Stratification High             6 Minute Walk:  6 Minute Walk     Row Name 07/30/21 1412         6 Minute Walk   Phase Initial     Distance 1435 feet     Walk Time 6 minutes     # of Rest Breaks 0     MPH 2.72     METS 3.38     RPE 9     Perceived Dyspnea  0     VO2 Peak 11.81     Symptoms No     Resting HR 99 bpm     Resting BP 108/72     Resting Oxygen Saturation  96 %     Exercise Oxygen Saturation  during 6 min walk 95 %     Max Ex. HR 125 bpm     Max Ex. BP 160/64     2 Minute Post BP 134/60              Oxygen Initial Assessment:   Oxygen Re-Evaluation:   Oxygen Discharge (Final Oxygen Re-Evaluation):   Initial Exercise Prescription:  Initial Exercise Prescription - 07/30/21 1400       Date of Initial Exercise RX and Referring Provider   Date 07/30/21    Referring Provider Adrian Prows, MD    Expected Discharge Date 09/25/21      NuStep   Level 2    SPM 75    Minutes 30    METs 2.2      Prescription Details   Frequency (times per week) 3    Duration Progress to 30 minutes of continuous aerobic without signs/symptoms of physical distress      Intensity   THRR 40-80% of Max Heartrate 60-119    Ratings of Perceived Exertion 11-13    Perceived Dyspnea 0-4      Progression   Progression Continue progressive overload as per policy without signs/symptoms or physical distress.      Resistance Training   Training  Prescription Yes    Weight 2 lbs    Reps 10-15             Perform Capillary Blood Glucose checks as needed.  Exercise Prescription Changes:   Exercise Prescription Changes     Row Name 08/05/21 1000 09/02/21 0900 09/16/21 0900 09/23/21 0900       Response to Exercise   Blood Pressure (Admit) 120/64 120/70 141/70 130/70    Blood Pressure (Exercise) 128/72 138/68 142/68 160/80    Blood Pressure (Exit) 128/78 122/64 128/62 110/58    Heart Rate (Admit) 82 bpm 81 bpm 82 bpm 81 bpm    Heart Rate (Exercise) 109 bpm 107 bpm 108 bpm 125 bpm    Heart Rate (Exit) 89 bpm 76 bpm 73 bpm 87 bpm    Oxygen Saturation (Exercise) 96 % -- -- --    Rating of Perceived Exertion (Exercise) 11 11 11 11     Symptoms None None None None    Comments Pt's first day in the CRP2 program Reviewed METs, Goals, Home exercise Rx Reviewed METs Reviewed METs and Goals    Duration Continue with 30 min of aerobic exercise without signs/symptoms of physical distress. Continue with 30 min of aerobic exercise without signs/symptoms of physical distress. Continue with 30 min of aerobic exercise  without signs/symptoms of physical distress. Continue with 30 min of aerobic exercise without signs/symptoms of physical distress.    Intensity THRR unchanged THRR unchanged THRR unchanged THRR unchanged      Progression   Progression Continue to progress workloads to maintain intensity without signs/symptoms of physical distress. Continue to progress workloads to maintain intensity without signs/symptoms of physical distress. Continue to progress workloads to maintain intensity without signs/symptoms of physical distress. Continue to progress workloads to maintain intensity without signs/symptoms of physical distress.    Average METs 2.6 3.1 3.3 3.7      Resistance Training   Training Prescription No No No No    Weight No weights on wednesdays No weights on wednesdays No weights on wednesdays No weights on wednesdays       Interval Training   Interval Training No No No No      NuStep   Level 2 3 3 5     SPM 85 100 110 105    Minutes 30 30 30 28     METs 2.6 3.1 3.3 3.7      Home Exercise Plan   Plans to continue exercise at -- Home (comment) Home (comment) Home (comment)    Frequency -- Add 2 additional days to program exercise sessions. Add 2 additional days to program exercise sessions. Add 2 additional days to program exercise sessions.    Initial Home Exercises Provided -- 09/02/21 09/02/21 09/02/21             Exercise Comments:   Exercise Comments     Row Name 08/05/21 1106 09/02/21 1001 09/16/21 0937 09/18/21 1126 09/23/21 1000   Exercise Comments Pt's first day in the CRP2 program. Pt tolerated the session well today with no comaplaints. Reviewed METs, goals and home exercise Rx. Pt achieves 150+ minutres of exercise per week which is at goal. Pt will continue to walk at home 2x/week for 30 minutes in addtion to 3x/week for the CRP2 program. Pt verbalized understanding of the home exercise Rx and was provided a copy. Reviewed METs. Pt is making good progress. Pt voices ready to move to level 4 on nustep. Will increase workload next session Pt is walking at home 2 x / week for 30 minutes on her treadmill. Increased to level 4 on nustep today and tolerated well. Reviewed METs and goals. Pt increased workload on nustep with no complaints. Pt progressing well to goal of increased strenght and stamina. Provided handouts on heart healthy eating and food choices to assist pt in her goal of weight loss.            Exercise Goals and Review:   Exercise Goals     Row Name 07/30/21 1437             Exercise Goals   Increase Physical Activity Yes       Intervention Provide advice, education, support and counseling about physical activity/exercise needs.;Develop an individualized exercise prescription for aerobic and resistive training based on initial evaluation findings, risk stratification,  comorbidities and participant's personal goals.       Expected Outcomes Short Term: Attend rehab on a regular basis to increase amount of physical activity.;Long Term: Add in home exercise to make exercise part of routine and to increase amount of physical activity.;Long Term: Exercising regularly at least 3-5 days a week.       Increase Strength and Stamina Yes       Intervention Provide advice, education, support and counseling about physical activity/exercise needs.;Develop  an individualized exercise prescription for aerobic and resistive training based on initial evaluation findings, risk stratification, comorbidities and participant's personal goals.       Expected Outcomes Short Term: Increase workloads from initial exercise prescription for resistance, speed, and METs.;Short Term: Perform resistance training exercises routinely during rehab and add in resistance training at home;Long Term: Improve cardiorespiratory fitness, muscular endurance and strength as measured by increased METs and functional capacity (6MWT)       Able to understand and use rate of perceived exertion (RPE) scale Yes       Intervention Provide education and explanation on how to use RPE scale       Expected Outcomes Short Term: Able to use RPE daily in rehab to express subjective intensity level;Long Term:  Able to use RPE to guide intensity level when exercising independently       Knowledge and understanding of Target Heart Rate Range (THRR) Yes       Intervention Provide education and explanation of THRR including how the numbers were predicted and where they are located for reference       Expected Outcomes Short Term: Able to state/look up THRR;Short Term: Able to use daily as guideline for intensity in rehab;Long Term: Able to use THRR to govern intensity when exercising independently       Understanding of Exercise Prescription Yes       Intervention Provide education, explanation, and written materials on patient's  individual exercise prescription       Expected Outcomes Short Term: Able to explain program exercise prescription;Long Term: Able to explain home exercise prescription to exercise independently                Exercise Goals Re-Evaluation :  Exercise Goals Re-Evaluation     Row Name 08/05/21 1104 09/02/21 0957 09/23/21 0953         Exercise Goal Re-Evaluation   Exercise Goals Review Increase Physical Activity;Increase Strength and Stamina;Able to understand and use rate of perceived exertion (RPE) scale;Knowledge and understanding of Target Heart Rate Range (THRR);Understanding of Exercise Prescription Increase Physical Activity;Increase Strength and Stamina;Able to understand and use rate of perceived exertion (RPE) scale;Knowledge and understanding of Target Heart Rate Range (THRR);Understanding of Exercise Prescription;Able to check pulse independently Increase Physical Activity;Increase Strength and Stamina;Able to understand and use rate of perceived exertion (RPE) scale;Knowledge and understanding of Target Heart Rate Range (THRR);Understanding of Exercise Prescription     Comments Pt's first day in the CRP2 program. Pt understands the exercise Rx, THRR, and RPE scale. Reviewed METs, Goals anfd home exercise Rx. Pt voices progressing to goal of having more strength and stamina. Pt has noted improvement since beginning the CRP2 program. Pt has not had significant change in her body weight as weight loss is her other goal. Reviewed home exercise Rx. Pt currently walks 2x/week at home on her treadmill for 30 minutes. Reviewed METs and goals. Pt is progressing on her goal to gain strength and stamina. Pt voices some imrpovement in strength and stamina in regards to daily activities. Pt continues to exercise at home 2x/week for 30 minutes on her treadmill. Pt at goal of exercise 5x/week for 30+ minutes. Pt's other goal is weight loss, however pt has no significant weight loss to report.      Expected Outcomes Will continue to monitor patient and progress exercise workloads as tolerated. Pt will continue to exercise at home 2x/week in addtion to the [facility] program. Will continue to montior and progress exercise  workloads as tolerated.               Discharge Exercise Prescription (Final Exercise Prescription Changes):  Exercise Prescription Changes - 09/23/21 0900       Response to Exercise   Blood Pressure (Admit) 130/70    Blood Pressure (Exercise) 160/80    Blood Pressure (Exit) 110/58    Heart Rate (Admit) 81 bpm    Heart Rate (Exercise) 125 bpm    Heart Rate (Exit) 87 bpm    Rating of Perceived Exertion (Exercise) 11    Symptoms None    Comments Reviewed METs and Goals    Duration Continue with 30 min of aerobic exercise without signs/symptoms of physical distress.    Intensity THRR unchanged      Progression   Progression Continue to progress workloads to maintain intensity without signs/symptoms of physical distress.    Average METs 3.7      Resistance Training   Training Prescription No    Weight No weights on wednesdays      Interval Training   Interval Training No      NuStep   Level 5    SPM 105    Minutes 28    METs 3.7      Home Exercise Plan   Plans to continue exercise at Home (comment)    Frequency Add 2 additional days to program exercise sessions.    Initial Home Exercises Provided 09/02/21             Nutrition:  Target Goals: Understanding of nutrition guidelines, daily intake of sodium 1500mg , cholesterol 200mg , calories 30% from fat and 7% or less from saturated fats, daily to have 5 or more servings of fruits and vegetables.  Biometrics:  Pre Biometrics - 07/30/21 1413       Pre Biometrics   Waist Circumference 39.5 inches    Hip Circumference 44 inches    Waist to Hip Ratio 0.9 %    Triceps Skinfold 28 mm    % Body Fat 42.4 %    Grip Strength 20 kg    Flexibility 12.25 in    Single Leg Stand 6.62 seconds               Nutrition Therapy Plan and Nutrition Goals:   Nutrition Assessments:  MEDIFICTS Score Key: ?70 Need to make dietary changes  40-70 Heart Healthy Diet ? 40 Therapeutic Level Cholesterol Diet   Picture Your Plate Scores: [id number] Unhealthy dietary pattern with much room for improvement. 41-50 Dietary pattern unlikely to meet recommendations for good health and room for improvement. 51-60 More healthful dietary pattern, with some room for improvement.  >60 Healthy dietary pattern, although there may be some specific behaviors that could be improved.    Nutrition Goals Re-Evaluation:   Nutrition Goals Discharge (Final Nutrition Goals Re-Evaluation):   Psychosocial: Target Goals: Acknowledge presence or absence of significant depression and/or stress, maximize coping skills, provide positive support system. Participant is able to verbalize types and ability to use techniques and skills needed for reducing stress and depression.  Initial Review & Psychosocial Screening:  Initial Psych Review & Screening - 07/30/21 1446       Initial Review   Current issues with History of Depression   History of Anxiety     Family Dynamics   Good Support System? Yes   Trichelle has her husband and her church family for support   Comments Alvenia has a history of depression and is taking an  antidepressant. Mandalyn says the her depression is currently controlled at this time.      Barriers   Psychosocial barriers to participate in program There are no identifiable barriers or psychosocial needs.      Screening Interventions   Interventions Encouraged to exercise             Quality of Life Scores:  Quality of Life - 07/30/21 1429       Quality of Life   Select Quality of Life      Quality of Life Scores   Health/Function Pre 24.4 %    Socioeconomic Pre 26.64 %    Psych/Spiritual Pre 17.14 %    Family Pre 28.8 %    GLOBAL Pre 24.01 %            Scores of 19 and below  usually indicate a poorer quality of life in these areas.  A difference of  2-3 points is a clinically meaningful difference.  A difference of 2-3 points in the total score of the Quality of Life Index has been associated with significant improvement in overall quality of life, self-image, physical symptoms, and general health in studies assessing change in quality of life.  PHQ-9: Recent Review Flowsheet Data     Depression screen [facility] 2/9 07/30/2021 06/30/2017 03/04/2016 08/28/2015 03/25/2015   Decreased Interest 0 1 3 2  0   Down, Depressed, Hopeless 0 2 - 0 0   PHQ - 2 Score 0 3 3 2  0   Altered sleeping - - 2 2 -   Tired, decreased energy - 3 3 3  -   Change in appetite - 3 3 3  -   Feeling bad or failure about yourself  - 3 2 3  -   Trouble concentrating - 2 0 0 -   Moving slowly or fidgety/restless - 2 0 0 -   Suicidal thoughts - 0 0 0 -   PHQ-9 Score - - 13 13 -   Difficult doing work/chores - Somewhat difficult - - -      Interpretation of Total Score  Total Score Depression Severity:  1-4 = Minimal depression, 5-9 = Mild depression, 10-14 = Moderate depression, 15-19 = Moderately severe depression, 20-27 = Severe depression   Psychosocial Evaluation and Intervention:   Psychosocial Re-Evaluation:  Psychosocial Re-Evaluation     Row Name 08/12/21 0949 09/02/21 0930 09/29/21 1453         Psychosocial Re-Evaluation   Current issues with History of Depression History of Depression History of Depression     Comments Reviewed quality of life questionnaire with [facility] today. No increased cocerns or stressors voiced Janely says she has ongoing stress and depression. Dr Collin's office called. Chassidy is interested in receving counselling. Ivyana says she has ongoing stress and depression. Dr Collin's office called. Meeah did not discuss the specifics     Expected Outcomes Nermeen will have continued controlled depression upon completion of phase 2 cardiac rehab. Aqueelah will have  continued controlled depression upon completion of phase 2 cardiac rehab. Shivon will have continued controlled depression upon completion of phase 2 cardiac rehab.     Interventions Stress management education;Encouraged to attend Cardiac Rehabilitation for the exercise Stress management education;Encouraged to attend Cardiac Rehabilitation for the exercise Stress management education;Encouraged to attend Cardiac Rehabilitation for the exercise     Continue Psychosocial Services  Follow up required by counselor Follow up required by staff Follow up required by staff  Psychosocial Discharge (Final Psychosocial Re-Evaluation):  Psychosocial Re-Evaluation - 09/29/21 1453       Psychosocial Re-Evaluation   Current issues with History of Depression    Comments Lashieka says she has ongoing stress and depression. Dr Collin's office called. Deztiny did not discuss the specifics    Expected Outcomes Nari will have continued controlled depression upon completion of phase 2 cardiac rehab.    Interventions Stress management education;Encouraged to attend Cardiac Rehabilitation for the exercise    Continue Psychosocial Services  Follow up required by staff             Vocational Rehabilitation: Provide vocational rehab assistance to qualifying candidates.   Vocational Rehab Evaluation & Intervention:  Vocational Rehab - 07/30/21 1451       Initial Vocational Rehab Evaluation & Intervention   Assessment shows need for Vocational Rehabilitation No   Kristl is retired and does not need vocational rehab at this time.            Education: Education Goals: Education classes will be provided on a weekly basis, covering required topics. Participant will state understanding/return demonstration of topics presented.  Learning Barriers/Preferences:  Learning Barriers/Preferences - 07/30/21 1431       Learning Barriers/Preferences   Learning Barriers Sight   wears glasses    Learning Preferences Audio;Written Material;Computer/Internet;Group Instruction;Individual Instruction;Pictoral;Skilled Demonstration;Verbal Instruction;Video             Education Topics: Hypertension, Hypertension Reduction -Define heart disease and high blood pressure. Discus how high blood pressure affects the body and ways to reduce high blood pressure.   Exercise and Your Heart -Discuss why it is important to exercise, the FITT principles of exercise, normal and abnormal responses to exercise, and how to exercise safely.   Angina -Discuss definition of angina, causes of angina, treatment of angina, and how to decrease risk of having angina.   Cardiac Medications -Review what the following cardiac medications are used for, how they affect the body, and side effects that may occur when taking the medications.  Medications include Aspirin, Beta blockers, calcium channel blockers, ACE Inhibitors, angiotensin receptor blockers, diuretics, digoxin, and antihyperlipidemics.   Congestive Heart Failure -Discuss the definition of CHF, how to live with CHF, the signs and symptoms of CHF, and how keep track of weight and sodium intake.   Heart Disease and Intimacy -Discus the effect sexual activity has on the heart, how changes occur during intimacy as we age, and safety during sexual activity.   Smoking Cessation / COPD -Discuss different methods to quit smoking, the health benefits of quitting smoking, and the definition of COPD.   Nutrition I: Fats -Discuss the types of cholesterol, what cholesterol does to the heart, and how cholesterol levels can be controlled.   Nutrition II: Labels -Discuss the different components of food labels and how to read food label   Heart Parts/Heart Disease and PAD -Discuss the anatomy of the heart, the pathway of blood circulation through the heart, and these are affected by heart disease.   Stress I: Signs and Symptoms -Discuss the  causes of stress, how stress may lead to anxiety and depression, and ways to limit stress.   Stress II: Relaxation -Discuss different types of relaxation techniques to limit stress.   Warning Signs of Stroke / TIA -Discuss definition of a stroke, what the signs and symptoms are of a stroke, and how to identify when someone is having stroke.   Knowledge Questionnaire Score:  Knowledge Questionnaire Score - 07/30/21  1421       Knowledge Questionnaire Score   Pre Score 21/24             Core Components/Risk Factors/Patient Goals at Admission:  Personal Goals and Risk Factors at Admission - 07/30/21 1429       Core Components/Risk Factors/Patient Goals on Admission    Weight Management Yes;Obesity;Weight Loss    Intervention Weight Management: Develop a combined nutrition and exercise program designed to reach desired caloric intake, while maintaining appropriate intake of nutrient and fiber, sodium and fats, and appropriate energy expenditure required for the weight goal.;Weight Management: Provide education and appropriate resources to help participant work on and attain dietary goals.;Weight Management/Obesity: Establish reasonable short term and long term weight goals.;Obesity: Provide education and appropriate resources to help participant work on and attain dietary goals.    Admit Weight 170 lb 13.7 oz (77.5 kg)    Expected Outcomes Short Term: Continue to assess and modify interventions until short term weight is achieved;Long Term: Adherence to nutrition and physical activity/exercise program aimed toward attainment of established weight goal;Weight Maintenance: Understanding of the daily nutrition guidelines, which includes 25-35% calories from fat, 7% or less cal from saturated fats, less than 200mg  cholesterol, less than 1.5gm of sodium, & 5 or more servings of fruits and vegetables daily;Weight Loss: Understanding of general recommendations for a balanced deficit meal plan,  which promotes 1-2 lb weight loss per week and includes a negative energy balance of [id number] kcal/d;Understanding recommendations for meals to include 15-35% energy as protein, 25-35% energy from fat, 35-60% energy from carbohydrates, less than 200mg  of dietary cholesterol, 20-35 gm of total fiber daily;Understanding of distribution of calorie intake throughout the day with the consumption of 4-5 meals/snacks    Diabetes Yes    Intervention Provide education about signs/symptoms and action to take for hypo/hyperglycemia.;Provide education about proper nutrition, including hydration, and aerobic/resistive exercise prescription along with prescribed medications to achieve blood glucose in normal ranges: Fasting glucose 65-99 mg/dL    Expected Outcomes Short Term: Participant verbalizes understanding of the signs/symptoms and immediate care of hyper/hypoglycemia, proper foot care and importance of medication, aerobic/resistive exercise and nutrition plan for blood glucose control.;Long Term: Attainment of HbA1C < 7%.    Hypertension Yes    Intervention Provide education on lifestyle modifcations including regular physical activity/exercise, weight management, moderate sodium restriction and increased consumption of fresh fruit, vegetables, and low fat dairy, alcohol moderation, and smoking cessation.;Monitor prescription use compliance.    Expected Outcomes Short Term: Continued assessment and intervention until BP is < 140/61mm HG in hypertensive participants. < 130/53mm HG in hypertensive participants with diabetes, heart failure or chronic kidney disease.;Long Term: Maintenance of blood pressure at goal levels.    Lipids Yes    Intervention Provide education and support for participant on nutrition & aerobic/resistive exercise along with prescribed medications to achieve LDL 70mg , HDL >40mg .    Expected Outcomes Short Term: Participant states understanding of desired cholesterol values and is compliant  with medications prescribed. Participant is following exercise prescription and nutrition guidelines.;Long Term: Cholesterol controlled with medications as prescribed, with individualized exercise RX and with personalized nutrition plan. Value goals: LDL < 70mg , HDL > 40 mg.             Core Components/Risk Factors/Patient Goals Review:   Goals and Risk Factor Review     Row Name 08/05/21 1007 08/12/21 0955 09/02/21 1153 09/29/21 1454       Core Components/Risk Factors/Patient Goals Review  Personal Goals Review Weight Management/Obesity;Hypertension;Lipids;Diabetes Weight Management/Obesity;Hypertension;Lipids;Diabetes;Stress Weight Management/Obesity;Hypertension;Lipids;Diabetes;Stress Weight Management/Obesity;Hypertension;Lipids;Diabetes;Stress    Review Zani started exercise at cardiac rehab on 08/05/21 and did well with exercise. Vital signs and CBG's were stable. Kayln is off to good start to exercise at phase 2 cardiac rehab. Vital signs and CBG's have been stable. Dalores has been doing well wiht exercise at cardiac rehab. Kristopher has had some variations in her CBG's where her blood sugars have dropped rapidly during exercise. Patient encouraged not to over do it. Jalisa reports that she has not lost weight like she would like. its been difficult during the holidays. Sharrone has been doing well wiht exercise at cardiac rehab. Jeroldine continues to have  some variations in her CBG's where her blood sugars have dropped rapidly during exercise. Patient encouraged not to over do it. Chermaine reports that she has not lost weight like she would like. Deanza has gained weight since participating in phase 2 cardiac rehab. Dr Irven Shelling office has been notified. Patient instructed to watch portion sizes and watch salt intake.    Expected Outcomes Koby will continue to participate in phase 2 cardiac rehab for exercise, nutrition and lifestyle modifications. Sioban will continue to participate  in phase 2 cardiac rehab for exercise, nutrition and lifestyle modifications Benelli will continue to participate in phase 2 cardiac rehab for exercise, nutrition and lifestyle modifications Capucine will continue to participate in phase 2 cardiac rehab for exercise, nutrition and lifestyle modifications             Core Components/Risk Factors/Patient Goals at Discharge (Final Review):   Goals and Risk Factor Review - 09/29/21 1454       Core Components/Risk Factors/Patient Goals Review   Personal Goals Review Weight Management/Obesity;Hypertension;Lipids;Diabetes;Stress    Review Alonni has been doing well wiht exercise at cardiac rehab. Melis continues to have  some variations in her CBG's where her blood sugars have dropped rapidly during exercise. Patient encouraged not to over do it. Amarely reports that she has not lost weight like she would like. Ramyia has gained weight since participating in phase 2 cardiac rehab. Dr Irven Shelling office has been notified. Patient instructed to watch portion sizes and watch salt intake.    Expected Outcomes Nuria will continue to participate in phase 2 cardiac rehab for exercise, nutrition and lifestyle modifications             ITP Comments:  ITP Comments     Row Name 07/30/21 1444 08/05/21 1004 08/12/21 0948 09/02/21 0929 09/29/21 1450   ITP Comments Dr Fransico Him MD, Medical Director 30 Day ITP Review. Treneice started cardiac rehab on 08/05/21 and did well with exercise. 30 Day ITP Review. Hazleigh has good attendance and participation in phase 2 cardiac rehab. Erinne is off to a good start to exercise. 30 Day ITP Review. Darce has good attendance and participation in phase 2 cardiac rehab. 30 Day ITP Review. Alveena continues to have  good attendance and participation in phase 2 cardiac rehab.            Comments: See ITP comments.Harrell Gave RN BSN

## 2021-09-30 ENCOUNTER — Other Ambulatory Visit: Payer: Self-pay

## 2021-09-30 ENCOUNTER — Encounter (HOSPITAL_COMMUNITY)
Admission: RE | Admit: 2021-09-30 | Discharge: 2021-09-30 | Disposition: A | Discharge status: Home / Self Care | Payer: Medicare Other | Source: Ambulatory Visit | Attending: Cardiology | Admitting: Cardiology

## 2021-09-30 DIAGNOSIS — Z953 Presence of xenogenic heart valve: Secondary | ICD-10-CM | POA: Diagnosis not present

## 2021-09-30 DIAGNOSIS — Z48812 Encounter for surgical aftercare following surgery on the circulatory system: Secondary | ICD-10-CM | POA: Diagnosis not present

## 2021-10-01 ENCOUNTER — Telehealth: Payer: Self-pay | Admitting: Adult Health

## 2021-10-01 ENCOUNTER — Telehealth: Payer: Self-pay

## 2021-10-01 NOTE — Telephone Encounter (Signed)
Spoke with patient, she will cut back on salt and calories as suggested by Dr. Jacinto Halim, until her office visit on the 31st.

## 2021-10-01 NOTE — Telephone Encounter (Signed)
LVM for pt to call me back to schedule sleep study  

## 2021-10-02 ENCOUNTER — Telehealth (HOSPITAL_COMMUNITY): Payer: Self-pay | Admitting: *Deleted

## 2021-10-02 ENCOUNTER — Encounter (HOSPITAL_COMMUNITY): Payer: Medicare Other

## 2021-10-02 NOTE — Telephone Encounter (Signed)
Patient left message on department voicemail this morning. She's having diarrhea and will be unable to attend cardiac rehab today.

## 2021-10-05 ENCOUNTER — Other Ambulatory Visit: Payer: Self-pay

## 2021-10-05 ENCOUNTER — Encounter (HOSPITAL_COMMUNITY)
Admission: RE | Admit: 2021-10-05 | Discharge: 2021-10-05 | Disposition: A | Discharge status: Home / Self Care | Payer: Medicare Other | Source: Ambulatory Visit | Attending: Cardiology | Admitting: Cardiology

## 2021-10-05 DIAGNOSIS — Z48812 Encounter for surgical aftercare following surgery on the circulatory system: Secondary | ICD-10-CM | POA: Diagnosis not present

## 2021-10-05 DIAGNOSIS — Z953 Presence of xenogenic heart valve: Secondary | ICD-10-CM | POA: Diagnosis not present

## 2021-10-07 ENCOUNTER — Encounter (HOSPITAL_COMMUNITY)
Admission: RE | Admit: 2021-10-07 | Discharge: 2021-10-07 | Disposition: A | Discharge status: Home / Self Care | Payer: Medicare Other | Source: Ambulatory Visit | Attending: Cardiology | Admitting: Cardiology

## 2021-10-07 ENCOUNTER — Other Ambulatory Visit: Payer: Self-pay

## 2021-10-07 ENCOUNTER — Ambulatory Visit (INDEPENDENT_AMBULATORY_CARE_PROVIDER_SITE_OTHER): Payer: Medicare Other | Admitting: Neurology

## 2021-10-07 VITALS — Ht 63.0 in | Wt 177.9 lb

## 2021-10-07 DIAGNOSIS — Z48812 Encounter for surgical aftercare following surgery on the circulatory system: Secondary | ICD-10-CM | POA: Diagnosis not present

## 2021-10-07 DIAGNOSIS — Z9989 Dependence on other enabling machines and devices: Secondary | ICD-10-CM

## 2021-10-07 DIAGNOSIS — G4733 Obstructive sleep apnea (adult) (pediatric): Secondary | ICD-10-CM

## 2021-10-07 DIAGNOSIS — Z953 Presence of xenogenic heart valve: Secondary | ICD-10-CM

## 2021-10-08 ENCOUNTER — Telehealth: Payer: Self-pay | Admitting: Neurology

## 2021-10-08 DIAGNOSIS — G4733 Obstructive sleep apnea (adult) (pediatric): Secondary | ICD-10-CM

## 2021-10-08 DIAGNOSIS — Z9989 Dependence on other enabling machines and devices: Secondary | ICD-10-CM

## 2021-10-08 DIAGNOSIS — I471 Supraventricular tachycardia: Secondary | ICD-10-CM

## 2021-10-08 NOTE — Progress Notes (Signed)
° ° ° ° ° ° °.  °  °  [facility] at [facility]   HOME SLEEP TEST REPORT ( by Watch PAT)   STUDY DATE: 10-07-2021    ORDERING CLINICIAN: Melvyn Novas, MD  REFERRING CLINICIAN: Butch Penny, NP, Dr. Webb Silversmith    CLINICAL INFORMATION/HISTORY: 71 year-old Caucasian female Patient with severe aortic stenosis, bicuspid valve stenosis surgery,  DM, Hypothyroidism, SVT, HTN and OSA on CPAP. Here to repeat HST for baseline before ordering a new CPAP machine.    Epworth sleepiness score: n/a /24. BMI: 31.6 kg/m Neck Circumference: n/a    FINDINGS:   Sleep Summary:   Total Recording Time (hours, min):    Total recording time for this patient amounted to 9 hours 31 minutes of which 8 hours and 39 minutes was a total recorded sleep time.  16.7% of sleep time were REM sleep.   Respiratory Indices:   Calculated pAHI (per hour):     31.2/h which exacerbated during REM sleep to 40.5/h and during non-REM sleep AHI was 29.3/h.                        Positional AHI: The patient slept mostly in supine sleep here the AHI was 34.7/h which is severe in prone sleep her AHI was 10.7/h and sleeping on the right side 11.4/h.  Snoring level was above average with a mean volume of 43 dB.   Moderate - loud Snoring accompanied 76% of total sleep time.                                                  Oxygen Saturation Statistics:   O2 Saturation Range (%):   Oxygen saturation varied between a nadir of 80% and a maximum of 98% with a mean saturation of 92%.                                    O2 Saturation (minutes) <89%:   8.9 minutes or 1.7% of sleep time        Pulse Rate Statistics:  Pulse Range: Heart rate varied between 65 and 99 bpm with a mean heart rate of 79 bpm.                IMPRESSION:  This HST confirms the presence of severe sleep apnea not REM sleep dependent but certainly REM sleep accentuated and very much dependent on supine sleep position.  Loud snoring was accompanying this home sleep  test.   RECOMMENDATION: Continuation of CPAP therapy is recommended, the patient needs a replacement machine soon as her machine indicated to be no longer serviceable.  The machine will be set for an auto - pressure setting between 6 and 12 cmH2O with 3 cm EPR, heated humidification and a mask of patient's choice. Prefer ResMed model.     INTERPRETING PHYSICIAN:   Melvyn Novas, MD   Medical Director of [facility] at [facility].

## 2021-10-08 NOTE — Procedures (Signed)
° ° ° ° ° ° °.  °  °

## 2021-10-08 NOTE — Telephone Encounter (Signed)
IMPRESSION:  This HST confirms the presence of severe sleep apnea (not REM sleep dependent but certainly REM sleep accentuated) and very much dependent on supine sleep position.  Loud snoring was accompanying this home sleep test.   RECOMMENDATION: Continuation of CPAP therapy is recommended, the patient needs a replacement machine soon as her machine indicated to be no longer serviceable.  The machine will be set for an auto - pressure setting between 5 and 12 cmH2O with 3 cm EPR, heated humidification and a mask of patient's choice. Prefer ResMed model.  Please advise the patient to avoid supine sleep position AS much as possible. Interface may be chosen by her ability to sleep in other than supine position.

## 2021-10-09 ENCOUNTER — Encounter (HOSPITAL_COMMUNITY)
Admission: RE | Admit: 2021-10-09 | Discharge: 2021-10-09 | Disposition: A | Discharge status: Home / Self Care | Payer: Medicare Other | Source: Ambulatory Visit | Attending: Cardiology | Admitting: Cardiology

## 2021-10-09 ENCOUNTER — Other Ambulatory Visit: Payer: Self-pay

## 2021-10-09 DIAGNOSIS — Z953 Presence of xenogenic heart valve: Secondary | ICD-10-CM

## 2021-10-09 DIAGNOSIS — Z48812 Encounter for surgical aftercare following surgery on the circulatory system: Secondary | ICD-10-CM | POA: Diagnosis not present

## 2021-10-09 NOTE — Progress Notes (Signed)
Discharge Progress Report  Patient Details  Name: [name] MRN: [id number] Date of Birth: 08-24-1951 Referring Provider:   Flowsheet Row CARDIAC REHAB PHASE II ORIENTATION from 07/30/2021 in [place]  Referring Provider Adrian Prows, MD        Number of Visits: 22  Reason for Discharge:  Patient reached a stable level of exercise. Patient independent in their exercise. Patient has met program and personal goals.  Smoking History:  Social History   Tobacco Use  Smoking Status Former   Packs/day: 0.25   Years: 15.00   Pack years: 3.75   Types: Cigarettes   Quit date: 03/13/1988   Years since quitting: 33.6  Smokeless Tobacco Never    Diagnosis:  06/05/21 S/P Aortic Valve Replacement  ADL UCSD:   Initial Exercise Prescription:  Initial Exercise Prescription - 07/30/21 1400       Date of Initial Exercise RX and Referring Provider   Date 07/30/21    Referring Provider Adrian Prows, MD    Expected Discharge Date 09/25/21      NuStep   Level 2    SPM 75    Minutes 30    METs 2.2      Prescription Details   Frequency (times per week) 3    Duration Progress to 30 minutes of continuous aerobic without signs/symptoms of physical distress      Intensity   THRR 40-80% of Max Heartrate 60-119    Ratings of Perceived Exertion 11-13    Perceived Dyspnea 0-4      Progression   Progression Continue progressive overload as per policy without signs/symptoms or physical distress.      Resistance Training   Training Prescription Yes    Weight 2 lbs    Reps 10-15             Discharge Exercise Prescription (Final Exercise Prescription Changes):  Exercise Prescription Changes - 10/12/21 1600       Response to Exercise   Blood Pressure (Admit) --    Blood Pressure (Exercise) --    Blood Pressure (Exit) --    Heart Rate (Admit) --    Heart Rate (Exercise) --    Heart Rate (Exit) --    Rating of Perceived Exertion  (Exercise) 9    Symptoms --    Comments --    Duration --    Intensity --      Progression   Progression --    Average METs --      Resistance Training   Training Prescription --    Weight --    Reps --    Time --      Interval Training   Interval Training --      NuStep   Level --    SPM --    Minutes --    METs --      Home Exercise Plan   Plans to continue exercise at --    Frequency --    Initial Home Exercises Provided --             Functional Capacity:  6 Minute Walk     Row Name 07/30/21 1412 10/07/21 1156       6 Minute Walk   Phase Initial Discharge    Distance 1435 feet 1641 feet    Distance % Change -- 14.3 %    Distance Feet Change -- 206 ft    Walk Time 6 minutes 6 minutes    #  of Rest Breaks 0 0    MPH 2.72 3.11    METS 3.38 3.52    RPE 9 8    Perceived Dyspnea  0 0    VO2 Peak 11.81 12.32    Symptoms No No    Resting HR 99 bpm 86 bpm    Resting BP 108/72 120/64    Resting Oxygen Saturation  96 % 96 %    Exercise Oxygen Saturation  during 6 min walk 95 % 95 %    Max Ex. HR 125 bpm 130 bpm    Max Ex. BP 160/64 144/68    2 Minute Post BP 134/60 --             Psychological, QOL, Others - Outcomes: PHQ 2/9: Depression screen [facility] 2/9 10/09/2021 07/30/2021 06/30/2017 03/04/2016 08/28/2015  Decreased Interest 3 0 1 3 2   Down, Depressed, Hopeless 3 0 2 - 0  PHQ - 2 Score 6 0 3 3 2   Altered sleeping 2 - - 2 2  Tired, decreased energy 1 - 3 3 3   Change in appetite 1 - 3 3 3   Feeling bad or failure about yourself  1 - 3 2 3   Trouble concentrating 0 - 2 0 0  Moving slowly or fidgety/restless 0 - 2 0 0  Suicidal thoughts 0 - 0 0 0  PHQ-9 Score 11 - - 13 13  Difficult doing work/chores Not difficult at all - Somewhat difficult - -    Quality of Life:  Quality of Life - 09/30/21 1006       Quality of Life   Select Quality of Life      Quality of Life Scores   Health/Function Pre 24.4 %    Health/Function Post 18.9 %     Health/Function % Change -22.54 %    Socioeconomic Pre 26.64 %    Socioeconomic Post 25.94 %    Socioeconomic % Change  -2.63 %    Psych/Spiritual Pre 17.14 %    Psych/Spiritual Post 13.07 %    Psych/Spiritual % Change -23.75 %    Family Pre 28.8 %    Family Post 23.7 %    Family % Change -17.71 %    GLOBAL Pre 24.01 %    GLOBAL Post 20.03 %    GLOBAL % Change -16.58 %             Personal Goals: Goals established at orientation with interventions provided to work toward goal.  Personal Goals and Risk Factors at Admission - 07/30/21 1429       Core Components/Risk Factors/Patient Goals on Admission    Weight Management Yes;Obesity;Weight Loss    Intervention Weight Management: Develop a combined nutrition and exercise program designed to reach desired caloric intake, while maintaining appropriate intake of nutrient and fiber, sodium and fats, and appropriate energy expenditure required for the weight goal.;Weight Management: Provide education and appropriate resources to help participant work on and attain dietary goals.;Weight Management/Obesity: Establish reasonable short term and long term weight goals.;Obesity: Provide education and appropriate resources to help participant work on and attain dietary goals.    Admit Weight 170 lb 13.7 oz (77.5 kg)    Expected Outcomes Short Term: Continue to assess and modify interventions until short term weight is achieved;Long Term: Adherence to nutrition and physical activity/exercise program aimed toward attainment of established weight goal;Weight Maintenance: Understanding of the daily nutrition guidelines, which includes 25-35% calories from fat, 7% or less cal from saturated fats,  less than 227m cholesterol, less than 1.5gm of sodium, & 5 or more servings of fruits and vegetables daily;Weight Loss: Understanding of general recommendations for a balanced deficit meal plan, which promotes 1-2 lb weight loss per week and includes a negative  energy balance of [id number] kcal/d;Understanding recommendations for meals to include 15-35% energy as protein, 25-35% energy from fat, 35-60% energy from carbohydrates, less than 2069mof dietary cholesterol, 20-35 gm of total fiber daily;Understanding of distribution of calorie intake throughout the day with the consumption of 4-5 meals/snacks    Diabetes Yes    Intervention Provide education about signs/symptoms and action to take for hypo/hyperglycemia.;Provide education about proper nutrition, including hydration, and aerobic/resistive exercise prescription along with prescribed medications to achieve blood glucose in normal ranges: Fasting glucose 65-99 mg/dL    Expected Outcomes Short Term: Participant verbalizes understanding of the signs/symptoms and immediate care of hyper/hypoglycemia, proper foot care and importance of medication, aerobic/resistive exercise and nutrition plan for blood glucose control.;Long Term: Attainment of HbA1C < 7%.    Hypertension Yes    Intervention Provide education on lifestyle modifcations including regular physical activity/exercise, weight management, moderate sodium restriction and increased consumption of fresh fruit, vegetables, and low fat dairy, alcohol moderation, and smoking cessation.;Monitor prescription use compliance.    Expected Outcomes Short Term: Continued assessment and intervention until BP is < 140/9084mG in hypertensive participants. < 130/36m72m in hypertensive participants with diabetes, heart failure or chronic kidney disease.;Long Term: Maintenance of blood pressure at goal levels.    Lipids Yes    Intervention Provide education and support for participant on nutrition & aerobic/resistive exercise along with prescribed medications to achieve LDL <70mg38mL >40mg.84mExpected Outcomes Short Term: Participant states understanding of desired cholesterol values and is compliant with medications prescribed. Participant is following exercise  prescription and nutrition guidelines.;Long Term: Cholesterol controlled with medications as prescribed, with individualized exercise RX and with personalized nutrition plan. Value goals: LDL < 70mg, 61m> 40 mg.              Personal Goals Discharge:  Goals and Risk Factor Review     Row Name 08/05/21 1007 08/12/21 0955 09/02/21 1153 09/29/21 1454       Core Components/Risk Factors/Patient Goals Review   Personal Goals Review Weight Management/Obesity;Hypertension;Lipids;Diabetes Weight Management/Obesity;Hypertension;Lipids;Diabetes;Stress Weight Management/Obesity;Hypertension;Lipids;Diabetes;Stress Weight Management/Obesity;Hypertension;Lipids;Diabetes;Stress    Review KathrynCedard exercise at cardiac rehab on 08/05/21 and did well with exercise. Vital signs and CBG's were stable. KathrynClark to good start to exercise at phase 2 cardiac rehab. Vital signs and CBG's have been stable. KathrynPennyen doing well wiht exercise at cardiac rehab. KathrynJohnnied some variations in her CBG's where her blood sugars have dropped rapidly during exercise. Patient encouraged not to over do it. KathrynAmarisas that she has not lost weight like she would like. its been difficult during the holidays. KathrynSamariahen doing well wiht exercise at cardiac rehab. KathrynKaterinues to have  some variations in her CBG's where her blood sugars have dropped rapidly during exercise. Patient encouraged not to over do it. KathrynErdines that she has not lost weight like she would like. KathrynUnknownined weight since participating in phase 2 cardiac rehab. Dr Ganji'sIrven Shelling has been notified. Patient instructed to watch portion sizes and watch salt intake.    Expected Outcomes KathrynJanneontinue to participate in phase 2 cardiac rehab for exercise, nutrition and lifestyle modifications. KathrynAggieontinue to participate in  phase 2 cardiac rehab for exercise, nutrition and lifestyle modifications Jailynne  will continue to participate in phase 2 cardiac rehab for exercise, nutrition and lifestyle modifications Lavelle will continue to participate in phase 2 cardiac rehab for exercise, nutrition and lifestyle modifications             Exercise Goals and Review:  Exercise Goals     Row Name 07/30/21 1437             Exercise Goals   Increase Physical Activity Yes       Intervention Provide advice, education, support and counseling about physical activity/exercise needs.;Develop an individualized exercise prescription for aerobic and resistive training based on initial evaluation findings, risk stratification, comorbidities and participant's personal goals.       Expected Outcomes Short Term: Attend rehab on a regular basis to increase amount of physical activity.;Long Term: Add in home exercise to make exercise part of routine and to increase amount of physical activity.;Long Term: Exercising regularly at least 3-5 days a week.       Increase Strength and Stamina Yes       Intervention Provide advice, education, support and counseling about physical activity/exercise needs.;Develop an individualized exercise prescription for aerobic and resistive training based on initial evaluation findings, risk stratification, comorbidities and participant's personal goals.       Expected Outcomes Short Term: Increase workloads from initial exercise prescription for resistance, speed, and METs.;Short Term: Perform resistance training exercises routinely during rehab and add in resistance training at home;Long Term: Improve cardiorespiratory fitness, muscular endurance and strength as measured by increased METs and functional capacity (6MWT)       Able to understand and use rate of perceived exertion (RPE) scale Yes       Intervention Provide education and explanation on how to use RPE scale       Expected Outcomes Short Term: Able to use RPE daily in rehab to express subjective intensity level;Long Term:   Able to use RPE to guide intensity level when exercising independently       Knowledge and understanding of Target Heart Rate Range (THRR) Yes       Intervention Provide education and explanation of THRR including how the numbers were predicted and where they are located for reference       Expected Outcomes Short Term: Able to state/look up THRR;Short Term: Able to use daily as guideline for intensity in rehab;Long Term: Able to use THRR to govern intensity when exercising independently       Understanding of Exercise Prescription Yes       Intervention Provide education, explanation, and written materials on patient's individual exercise prescription       Expected Outcomes Short Term: Able to explain program exercise prescription;Long Term: Able to explain home exercise prescription to exercise independently                Exercise Goals Re-Evaluation:  Exercise Goals Re-Evaluation     Row Name 08/05/21 1104 09/02/21 0957 09/23/21 0953 10/09/21 1000       Exercise Goal Re-Evaluation   Exercise Goals Review Increase Physical Activity;Increase Strength and Stamina;Able to understand and use rate of perceived exertion (RPE) scale;Knowledge and understanding of Target Heart Rate Range (THRR);Understanding of Exercise Prescription Increase Physical Activity;Increase Strength and Stamina;Able to understand and use rate of perceived exertion (RPE) scale;Knowledge and understanding of Target Heart Rate Range (THRR);Understanding of Exercise Prescription;Able to check pulse independently Increase Physical Activity;Increase Strength and Stamina;Able to understand  and use rate of perceived exertion (RPE) scale;Knowledge and understanding of Target Heart Rate Range (THRR);Understanding of Exercise Prescription Increase Physical Activity;Increase Strength and Stamina;Able to understand and use rate of perceived exertion (RPE) scale;Knowledge and understanding of Target Heart Rate Range  (THRR);Understanding of Exercise Prescription    Comments Pt's first day in the CRP[facility] program. Pt understands the exercise Rx, THRR, and RPE scale. Reviewed METs, Goals anfd home exercise Rx. Pt voices progressing to goal of having more strength and stamina. Pt has noted improvement since beginning the CRP2 program. Pt has not had significant change in her body weight as weight loss is her other goal. Reviewed home exercise Rx. Pt currently walks 2x/week at home on her treadmill for 30 minutes. Reviewed METs and goals. Pt is progressing on her goal to gain strength and stamina. Pt voices some imrpovement in strength and stamina in regards to daily activities. Pt continues to exercise at home 2x/week for 30 minutes on her treadmill. Pt at goal of exercise 5x/week for 30+ minutes. Pt's other goal is weight loss, however pt has no significant weight loss to report. [facility]. The patient has been exercisng here in the program 3x/week and is doing 2 x/week at home on her treadmill for 25-30 minutes. Pt is at goal for 5-7x/week for exercise. Pt has improved her stregth and stamina and had a peak MET level of 4.4.    Expected Outcomes Will continue to monitor patient and progress exercise workloads as tolerated. Pt will continue to exercise at home 2x/week in addtion to the [place] program. Will continue to montior and progress exercise workloads as tolerated. Pt will continue to exercise at home on her treadmill 3-5x/week for 30 minutes.             Nutrition & Weight - Outcomes:  Pre Biometrics - 07/30/21 1413       Pre Biometrics   Waist Circumference 39.5 inches    Hip Circumference 44 inches    Waist to Hip Ratio 0.9 %    Triceps Skinfold 28 mm    % Body Fat 42.4 %    Grip Strength 20 kg    Flexibility 12.25 in    Single Leg Stand 6.62 seconds             Post Biometrics - 10/07/21 0815        Post  Biometrics   Height 5' 3"  (1.6 m)    Weight 80.7 kg    Waist  Circumference 40 inches    Hip Circumference 44 inches    Waist to Hip Ratio 0.91 %    BMI (Calculated) 31.52    Triceps Skinfold 28 mm    % Body Fat 43.3 %    Grip Strength 26 kg    Flexibility 12.75 in    Single Leg Stand 3.12 seconds             Nutrition:   Nutrition Discharge:   Education Questionnaire Score:  Knowledge Questionnaire Score - 09/30/21 1006       Knowledge Questionnaire Score   Post Score 23/24             Goals reviewed with patient; copy given to patient.Pt graduated from cardiac rehab program on 10/09/21 with completion of 22 exercise sessions in Phase II. Pt maintained good attendance and progressed nicely during his participation in rehab as evidenced by increased MET level.   Medication list reconciled. Repeat  PHQ score-6  .  Shawnae says she feels more depressed and plans to reach out to her primary care Dr Janie Morning about possibly adjusting her antidepressant. Pt has made significant lifestyle changes and should be commended for her success. Pt feels she has achieved her goals during cardiac rehab.   Pt plans to continue exercise by walking on the treadmill.Omaira increased her distance on her post exercise walk test by 206 feet. We are proud of [facility]'s progress. Millianna gained 3.4 kg since starting the program. Dr Einar Gip was notified about weight gain.Harrell Gave RN BSN

## 2021-10-12 ENCOUNTER — Telehealth: Payer: Self-pay | Admitting: Adult Health

## 2021-10-12 DIAGNOSIS — G4733 Obstructive sleep apnea (adult) (pediatric): Secondary | ICD-10-CM

## 2021-10-12 NOTE — Telephone Encounter (Signed)
Called pt and LVM with office number asking for call back.  

## 2021-10-12 NOTE — Telephone Encounter (Signed)
Sleep study did confirm severe sleep apnea.  Continue CPAP.  Order sent for new machine.  She can keep the same settings at 8 cmH2O with EPR 3.  Order has been placed please call patient

## 2021-10-12 NOTE — Telephone Encounter (Signed)
Spoke with the pt and discuss sleep study results. Pt verbalized understanding and would like to continue using [facility]. Pt will f/u with them in 1 week if she hasn't been called.  CPAP order, sleep study results, insurance and demographics and last office note all faxed to [facility] [phone]. Received a receipt of confirmation.

## 2021-10-13 ENCOUNTER — Ambulatory Visit: Payer: Medicare Other | Admitting: Cardiology

## 2021-10-22 DIAGNOSIS — E049 Nontoxic goiter, unspecified: Secondary | ICD-10-CM | POA: Diagnosis not present

## 2021-10-22 DIAGNOSIS — Z9641 Presence of insulin pump (external) (internal): Secondary | ICD-10-CM | POA: Diagnosis not present

## 2021-10-22 DIAGNOSIS — E559 Vitamin D deficiency, unspecified: Secondary | ICD-10-CM | POA: Diagnosis not present

## 2021-10-22 DIAGNOSIS — R5383 Other fatigue: Secondary | ICD-10-CM | POA: Diagnosis not present

## 2021-10-22 DIAGNOSIS — E109 Type 1 diabetes mellitus without complications: Secondary | ICD-10-CM | POA: Diagnosis not present

## 2021-10-22 DIAGNOSIS — K219 Gastro-esophageal reflux disease without esophagitis: Secondary | ICD-10-CM | POA: Diagnosis not present

## 2021-10-22 DIAGNOSIS — E1065 Type 1 diabetes mellitus with hyperglycemia: Secondary | ICD-10-CM | POA: Diagnosis not present

## 2021-10-22 DIAGNOSIS — E7800 Pure hypercholesterolemia, unspecified: Secondary | ICD-10-CM | POA: Diagnosis not present

## 2021-10-22 DIAGNOSIS — Z794 Long term (current) use of insulin: Secondary | ICD-10-CM | POA: Diagnosis not present

## 2021-10-22 DIAGNOSIS — J309 Allergic rhinitis, unspecified: Secondary | ICD-10-CM | POA: Diagnosis not present

## 2021-10-22 DIAGNOSIS — I10 Essential (primary) hypertension: Secondary | ICD-10-CM | POA: Diagnosis not present

## 2021-10-22 DIAGNOSIS — E039 Hypothyroidism, unspecified: Secondary | ICD-10-CM | POA: Diagnosis not present

## 2021-10-23 ENCOUNTER — Ambulatory Visit: Payer: Medicare Other | Admitting: Cardiology

## 2021-10-23 ENCOUNTER — Telehealth: Payer: Self-pay | Admitting: Adult Health

## 2021-10-23 NOTE — Telephone Encounter (Signed)
error 

## 2021-12-16 ENCOUNTER — Encounter: Payer: Self-pay | Admitting: Neurology

## 2021-12-16 DIAGNOSIS — G4733 Obstructive sleep apnea (adult) (pediatric): Secondary | ICD-10-CM

## 2021-12-16 DIAGNOSIS — I471 Supraventricular tachycardia: Secondary | ICD-10-CM

## 2021-12-24 ENCOUNTER — Telehealth: Payer: Self-pay | Admitting: Adult Health

## 2021-12-24 NOTE — Telephone Encounter (Signed)
The 04-06 my chart message was read to pt.  Pt states she doesn't want to switch DME's just yet, she is asking if another attempt can be made re: her getting a new CPAP.  Pt would like a call back she is aware the office is not open on Fridays. ?

## 2021-12-24 NOTE — Telephone Encounter (Signed)
Spoke to patient wants a order sent to [facility] for her new CPAP machine (resmed) pt states already had a HST in January.Just needs the order to be sent over. Will forward to Dr.Dohmeier .  ?

## 2021-12-24 NOTE — Telephone Encounter (Signed)
See Nurse Bruno's conversation with Patient regarding CPAP order- they have it.  ?[facility] did not have the machine we wanted for the patient, but they have our script since late January !! ?

## 2021-12-25 ENCOUNTER — Other Ambulatory Visit: Payer: Self-pay | Admitting: Family Medicine

## 2021-12-25 DIAGNOSIS — F439 Reaction to severe stress, unspecified: Secondary | ICD-10-CM

## 2021-12-28 ENCOUNTER — Other Ambulatory Visit: Payer: Self-pay | Admitting: Cardiology

## 2021-12-28 ENCOUNTER — Other Ambulatory Visit: Payer: Self-pay

## 2021-12-29 NOTE — Telephone Encounter (Signed)
Spoke to [facility] stated never received order.  Faxing  order  again today for patient .   ?

## 2022-01-05 NOTE — Telephone Encounter (Signed)
Error

## 2022-01-12 ENCOUNTER — Ambulatory Visit (INDEPENDENT_AMBULATORY_CARE_PROVIDER_SITE_OTHER): Payer: Medicare Other | Admitting: Adult Health

## 2022-01-12 ENCOUNTER — Encounter: Payer: Self-pay | Admitting: Adult Health

## 2022-01-12 VITALS — BP 138/72 | HR 72 | Ht 63.0 in | Wt 181.6 lb

## 2022-01-12 DIAGNOSIS — I6522 Occlusion and stenosis of left carotid artery: Secondary | ICD-10-CM | POA: Diagnosis not present

## 2022-01-12 DIAGNOSIS — Z9989 Dependence on other enabling machines and devices: Secondary | ICD-10-CM

## 2022-01-12 DIAGNOSIS — G4733 Obstructive sleep apnea (adult) (pediatric): Secondary | ICD-10-CM

## 2022-01-12 NOTE — Patient Instructions (Signed)
Continue using CPAP nightly and greater than 4 hours each night °If your symptoms worsen or you develop new symptoms please let us know.  ° °

## 2022-01-12 NOTE — Progress Notes (Signed)
PAP orders to [facility] from [place] per pt and provider request.  Received fax confirmation. [phone].  ?

## 2022-01-12 NOTE — Progress Notes (Signed)
? ? ?PATIENT: [name] ?DOB: December 25, 1950 ? ?REASON FOR VISIT: follow up ?HISTORY FROM: patient ?PRIMARY NEUROLOGIST: Dr. Vickey Huger ? ?HISTORY OF PRESENT ILLNESS: ?Today 01/12/22: ? ?[name] is a 71 year old female with a history of obstructive sleep apnea on CPAP.  She returns today for follow-up.  In January an order was sent for new machine but she reports that she has not gotten one yet.  She has been in contact with [facility] but reports that they told her there was a supply issue?  She is okay with switching to another DME if they take her insurance.  Her download is below ? ? ? ?REVIEW OF SYSTEMS: Out of a complete 14 system review of symptoms, the patient complains only of the following symptoms, and all other reviewed systems are negative. ? ?FSS ?ESS 1 ? ?ALLERGIES: ?Allergies  ?Allergen Reactions  ? Penicillins Anaphylaxis  ?  Has patient had a PCN reaction causing immediate rash, facial/tongue/throat swelling, SOB or lightheadedness with hypotension: No ?Has patient had a PCN reaction causing severe rash involving mucus membranes or skin necrosis: No ?Has patient had a PCN reaction that required hospitalization: Yes/ ?Has patient had a PCN reaction occurring within the last 10 years: No ?If all of the above answers are "NO", then may proceed with Cephalosporin use. ?  ? Ace Inhibitors   ?  INDUCED BRONCHOSPASM  ? Contrast Media [Iodinated Contrast Media]   ? ? ?HOME MEDICATIONS: ?Outpatient Medications Prior to Visit  ?Medication Sig Dispense Refill  ? acetaminophen (TYLENOL) 500 MG tablet Take 500 mg by mouth every 6 (six) hours as needed for headache.    ? albuterol (PROVENTIL HFA;VENTOLIN HFA) 108 (90 Base) MCG/ACT inhaler Inhale 2 puffs into the lungs every 6 (six) hours as needed. (Patient taking differently: Inhale 2 puffs into the lungs every 6 (six) hours as needed (Asthma).) 8.5 g 11  ? albuterol (PROVENTIL) (2.5 MG/3ML) 0.083% nebulizer solution Take 3 mLs (2.5 mg total) by nebulization  every 6 (six) hours as needed. [id number]:V61.607 (Patient taking differently: Take 2.5 mg by nebulization every 6 (six) hours as needed (Asthma). PXT06:Y69.485) 75 mL 1  ? ALPRAZolam (XANAX) 1 MG tablet TAKE ONE-HALF TABLET BY MOUTH ONCE DAILY AS NEEDED FOR STRESS (Patient taking differently: Take 0.5-1 mg by mouth daily as needed for anxiety.) 90 tablet 0  ? amLODipine (NORVASC) 10 MG tablet Take by mouth.    ? aspirin (ASPIRIN CHILDRENS) 81 MG chewable tablet Chew 1 tablet (81 mg total) by mouth daily.    ? atorvastatin (LIPITOR) 40 MG tablet Take 1 tablet (40 mg total) daily by mouth. 90 tablet 3  ? augmented betamethasone dipropionate (DIPROLENE-AF) 0.05 % cream Apply 1 application topically 2 (two) times daily as needed (eczema).    ? Biotin 46270 MCG TABS Take 10,000 mcg by mouth daily.    ? carboxymethylcellulose (REFRESH PLUS) 0.5 % SOLN Place 1 drop into both eyes 3 (three) times daily as needed (dry eyes).    ? Cholecalciferol (VITAMIN D) 50 MCG (2000 UT) CAPS Take 0.5 capsules (1,000 Units total) by mouth daily. (Patient taking differently: Take 2,000 Units by mouth daily.)    ? Cyanocobalamin (B-12) 5000 MCG CAPS Take 5,000 mcg by mouth daily.    ? diltiazem (CARDIZEM CD) 240 MG 24 hr capsule TAKE 1 CAPSULE EVERY DAY 90 capsule 1  ? famotidine (PEPCID) 20 MG tablet Take 20 mg by mouth 2 (two) times daily.    ? FIASP 100 UNIT/ML SOLN Inject  into the skin.    ? Glucose 15 g PACK Take 15-30 g by mouth daily as needed (low blood sugar).    ? L-Lysine 500 MG CAPS Take 500 mg by mouth daily.    ? levocetirizine (XYZAL ALLERGY 24HR) 5 MG tablet Take 1 tablet (5 mg total) by mouth every evening. 30 tablet 0  ? levothyroxine (SYNTHROID) 125 MCG tablet Take 125 mcg by mouth daily before breakfast.    ? losartan-hydrochlorothiazide (HYZAAR) 100-25 MG tablet Take 1 tablet by mouth every morning. 90 tablet 3  ? Magnesium 500 MG TABS Take 500 mg by mouth at bedtime.    ? Melatonin 10 MG TABS Take 10 mg by mouth at  bedtime.    ? omeprazole (PRILOSEC) 40 MG capsule Take 40 mg by mouth daily.    ? OVER THE COUNTER MEDICATION Take 4 tablets by mouth at bedtime. Focus factor otc supplement    ? Probiotic Product (PROBIOTIC PO) Take 1 capsule by mouth daily. 20 billion active cultures    ? rOPINIRole (REQUIP) 0.5 MG tablet Take 0.5 mg by mouth at bedtime.    ? sertraline (ZOLOFT) 100 MG tablet Take 100 mg by mouth daily.    ? simethicone (GAS-X) 80 MG chewable tablet Chew 1 tablet (80 mg total) by mouth 4 (four) times daily as needed for flatulence. 100 tablet 2  ? atenolol (TENORMIN) 100 MG tablet Take by mouth.    ? ELIQUIS 5 MG TABS tablet Take by mouth.    ? insulin aspart (NOVOLOG) 100 UNIT/ML injection Inject into the skin continuous. Via Insulin Pump Uses in pump basal rate 3.0 units per hour, bolus 1 unit per 30 carbs    ? ?No facility-administered medications prior to visit.  ? ? ?PAST MEDICAL HISTORY: ?Past Medical History:  ?Diagnosis Date  ? Anxiety   ? Aortic regurgitation   ? Arthritis   ? Asthma   ? Depression   ? Depression   ? Diabetes mellitus   ? Dyspnea   ? Facet joint disease of lumbosacral region   ? Fibromyalgia   ? GERD (gastroesophageal reflux disease)   ? Goiter   ? Hair loss   ? Heart murmur   ? Hypertension   ? Menopause   ? Mitral regurgitation   ? Mitral valve prolapse   ? Other and unspecified hyperlipidemia   ? Positive PPD   ? Sleep apnea   ? Small bowel obstruction (HCC) 02/09/2018  ? SVT (supraventricular tachycardia) (HCC)   ? Vasculitis (HCC)   ? ? ?PAST SURGICAL HISTORY: ?Past Surgical History:  ?Procedure Laterality Date  ? ABDOMINAL HYSTERECTOMY    ? AORTIC VALVE REPLACEMENT N/A 06/05/2021  ? Procedure: AORTIC VALVE REPLACEMENT (AVR), USING 27MM INSPIRIS RESILIA  AORTIC VALVE;  Surgeon: Alleen BorneBartle, Bryan K, MD;  Location: [facility] OR;  Service: Open Heart Surgery;  Laterality: N/A;  ? APPENDECTOMY    ? CARDIAC CATHETERIZATION    ? CHOLECYSTECTOMY    ? DIAGNOSTIC LAPAROSCOPY    ? EYE SURGERY Bilateral    ? cataract removal  ? PARTIAL HYSTERECTOMY    ? RIGHT/LEFT HEART CATH AND CORONARY ANGIOGRAPHY N/A 04/28/2021  ? Procedure: RIGHT/LEFT HEART CATH AND CORONARY ANGIOGRAPHY;  Surgeon: Yates DecampGanji, Jay, MD;  Location: [facility];  Service: Cardiovascular;  Laterality: N/A;  ? TEE WITHOUT CARDIOVERSION N/A 06/05/2021  ? Procedure: TRANSESOPHAGEAL ECHOCARDIOGRAM (TEE);  Surgeon: Alleen BorneBartle, Bryan K, MD;  [facility];  Service: Open Heart Surgery;  Laterality: N/A;  ? ? ?  FAMILY HISTORY: ?Family History  ?Problem Relation Age of Onset  ? Hepatitis Mother 2  ?     died from Hep A  ? Diabetes Father   ? Sleep apnea Father   ? Cancer Brother   ? Crohn's disease Brother   ? Alcohol abuse Brother   ? Sleep apnea Brother   ? ? ?SOCIAL HISTORY: ?Social History  ? ?Socioeconomic History  ? Marital status: Married  ?  Spouse name: Not on file  ? Number of children: 2  ? Years of education: Not on file  ? Highest education level: Not on file  ?Occupational History  ? Not on file  ?Tobacco Use  ? Smoking status: Former  ?  Packs/day: 0.25  ?  Years: 15.00  ?  Pack years: 3.75  ?  Types: Cigarettes  ?  Quit date: 03/13/1988  ?  Years since quitting: 33.8  ? Smokeless tobacco: Never  ?Vaping Use  ? Vaping Use: Never used  ?Substance and Sexual Activity  ? Alcohol use: No  ? Drug use: No  ? Sexual activity: Not Currently  ?Other Topics Concern  ? Not on file  ?Social History Narrative  ? Caffeine 1-2 cups coffee daily.  ? Lives at home with husband and daughter.  ? Retired.  ? Associates degree.  ? Has 4 kids (2 Biological)  ? ?Social Determinants of Health  ? ?Financial Resource Strain: Not on file  ?Food Insecurity: Not on file  ?Transportation Needs: Not on file  ?Physical Activity: Not on file  ?Stress: Not on file  ?Social Connections: Not on file  ?Intimate Partner Violence: Not on file  ? ? ? ? ?PHYSICAL EXAM ? ?Vitals:  ? 01/12/22 0744  ?BP: 138/72  ?Pulse: 72  ?Weight: 181 lb 9.6 oz (82.4 kg)  ?Height: 5\' 3"  (1.6 m)  ? ?Body  mass index is 32.17 kg/m?. ? ?Generalized: Well developed, in no acute distress  ?Chest: Lungs clear to auscultation bilaterally ? ?Neurological examination  ?Mentation: Alert oriented to time, place, history

## 2022-01-19 DIAGNOSIS — Z9641 Presence of insulin pump (external) (internal): Secondary | ICD-10-CM | POA: Diagnosis not present

## 2022-01-19 DIAGNOSIS — R5383 Other fatigue: Secondary | ICD-10-CM | POA: Diagnosis not present

## 2022-01-19 DIAGNOSIS — E039 Hypothyroidism, unspecified: Secondary | ICD-10-CM | POA: Diagnosis not present

## 2022-01-19 DIAGNOSIS — Z794 Long term (current) use of insulin: Secondary | ICD-10-CM | POA: Diagnosis not present

## 2022-01-19 DIAGNOSIS — E1065 Type 1 diabetes mellitus with hyperglycemia: Secondary | ICD-10-CM | POA: Diagnosis not present

## 2022-01-19 DIAGNOSIS — E7800 Pure hypercholesterolemia, unspecified: Secondary | ICD-10-CM | POA: Diagnosis not present

## 2022-01-19 DIAGNOSIS — I10 Essential (primary) hypertension: Secondary | ICD-10-CM | POA: Diagnosis not present

## 2022-01-19 DIAGNOSIS — E049 Nontoxic goiter, unspecified: Secondary | ICD-10-CM | POA: Diagnosis not present

## 2022-01-22 DIAGNOSIS — G2581 Restless legs syndrome: Secondary | ICD-10-CM | POA: Diagnosis not present

## 2022-01-22 DIAGNOSIS — F330 Major depressive disorder, recurrent, mild: Secondary | ICD-10-CM | POA: Diagnosis not present

## 2022-01-26 DIAGNOSIS — M9905 Segmental and somatic dysfunction of pelvic region: Secondary | ICD-10-CM | POA: Diagnosis not present

## 2022-01-26 DIAGNOSIS — M9901 Segmental and somatic dysfunction of cervical region: Secondary | ICD-10-CM | POA: Diagnosis not present

## 2022-01-26 DIAGNOSIS — M5386 Other specified dorsopathies, lumbar region: Secondary | ICD-10-CM | POA: Diagnosis not present

## 2022-01-26 DIAGNOSIS — M9904 Segmental and somatic dysfunction of sacral region: Secondary | ICD-10-CM | POA: Diagnosis not present

## 2022-01-26 DIAGNOSIS — M9903 Segmental and somatic dysfunction of lumbar region: Secondary | ICD-10-CM | POA: Diagnosis not present

## 2022-01-26 DIAGNOSIS — M5431 Sciatica, right side: Secondary | ICD-10-CM | POA: Diagnosis not present

## 2022-01-26 DIAGNOSIS — M531 Cervicobrachial syndrome: Secondary | ICD-10-CM | POA: Diagnosis not present

## 2022-01-26 DIAGNOSIS — M5137 Other intervertebral disc degeneration, lumbosacral region: Secondary | ICD-10-CM | POA: Diagnosis not present

## 2022-01-28 DIAGNOSIS — M9903 Segmental and somatic dysfunction of lumbar region: Secondary | ICD-10-CM | POA: Diagnosis not present

## 2022-01-28 DIAGNOSIS — M5137 Other intervertebral disc degeneration, lumbosacral region: Secondary | ICD-10-CM | POA: Diagnosis not present

## 2022-01-28 DIAGNOSIS — M5431 Sciatica, right side: Secondary | ICD-10-CM | POA: Diagnosis not present

## 2022-01-28 DIAGNOSIS — M9904 Segmental and somatic dysfunction of sacral region: Secondary | ICD-10-CM | POA: Diagnosis not present

## 2022-01-28 DIAGNOSIS — M5386 Other specified dorsopathies, lumbar region: Secondary | ICD-10-CM | POA: Diagnosis not present

## 2022-01-28 DIAGNOSIS — M9905 Segmental and somatic dysfunction of pelvic region: Secondary | ICD-10-CM | POA: Diagnosis not present

## 2022-01-28 DIAGNOSIS — M9901 Segmental and somatic dysfunction of cervical region: Secondary | ICD-10-CM | POA: Diagnosis not present

## 2022-01-28 DIAGNOSIS — M531 Cervicobrachial syndrome: Secondary | ICD-10-CM | POA: Diagnosis not present

## 2022-02-02 DIAGNOSIS — M531 Cervicobrachial syndrome: Secondary | ICD-10-CM | POA: Diagnosis not present

## 2022-02-02 DIAGNOSIS — M9904 Segmental and somatic dysfunction of sacral region: Secondary | ICD-10-CM | POA: Diagnosis not present

## 2022-02-02 DIAGNOSIS — M5431 Sciatica, right side: Secondary | ICD-10-CM | POA: Diagnosis not present

## 2022-02-02 DIAGNOSIS — M9905 Segmental and somatic dysfunction of pelvic region: Secondary | ICD-10-CM | POA: Diagnosis not present

## 2022-02-02 DIAGNOSIS — M9903 Segmental and somatic dysfunction of lumbar region: Secondary | ICD-10-CM | POA: Diagnosis not present

## 2022-02-02 DIAGNOSIS — M9901 Segmental and somatic dysfunction of cervical region: Secondary | ICD-10-CM | POA: Diagnosis not present

## 2022-02-02 DIAGNOSIS — M5386 Other specified dorsopathies, lumbar region: Secondary | ICD-10-CM | POA: Diagnosis not present

## 2022-02-02 DIAGNOSIS — M5137 Other intervertebral disc degeneration, lumbosacral region: Secondary | ICD-10-CM | POA: Diagnosis not present

## 2022-02-04 DIAGNOSIS — M5386 Other specified dorsopathies, lumbar region: Secondary | ICD-10-CM | POA: Diagnosis not present

## 2022-02-04 DIAGNOSIS — M531 Cervicobrachial syndrome: Secondary | ICD-10-CM | POA: Diagnosis not present

## 2022-02-04 DIAGNOSIS — M9901 Segmental and somatic dysfunction of cervical region: Secondary | ICD-10-CM | POA: Diagnosis not present

## 2022-02-04 DIAGNOSIS — M9903 Segmental and somatic dysfunction of lumbar region: Secondary | ICD-10-CM | POA: Diagnosis not present

## 2022-02-04 DIAGNOSIS — M5431 Sciatica, right side: Secondary | ICD-10-CM | POA: Diagnosis not present

## 2022-02-04 DIAGNOSIS — M9905 Segmental and somatic dysfunction of pelvic region: Secondary | ICD-10-CM | POA: Diagnosis not present

## 2022-02-04 DIAGNOSIS — M5137 Other intervertebral disc degeneration, lumbosacral region: Secondary | ICD-10-CM | POA: Diagnosis not present

## 2022-02-04 DIAGNOSIS — M9904 Segmental and somatic dysfunction of sacral region: Secondary | ICD-10-CM | POA: Diagnosis not present

## 2022-02-22 ENCOUNTER — Telehealth: Payer: Self-pay | Admitting: *Deleted

## 2022-02-22 NOTE — Telephone Encounter (Signed)
Received fax from [facility] that pt set up with new cpap machine 02/16/22. She will need 31-90 day follow up at our office from this date (between 03/19/22-05/17/2022).  Asked her to call back and schedule this appt. Please schedule if she calls.

## 2022-02-25 ENCOUNTER — Ambulatory Visit: Payer: Medicare Other | Admitting: Cardiology

## 2022-02-25 DIAGNOSIS — M9901 Segmental and somatic dysfunction of cervical region: Secondary | ICD-10-CM | POA: Diagnosis not present

## 2022-02-25 DIAGNOSIS — M531 Cervicobrachial syndrome: Secondary | ICD-10-CM | POA: Diagnosis not present

## 2022-02-25 DIAGNOSIS — M9904 Segmental and somatic dysfunction of sacral region: Secondary | ICD-10-CM | POA: Diagnosis not present

## 2022-02-25 DIAGNOSIS — M9903 Segmental and somatic dysfunction of lumbar region: Secondary | ICD-10-CM | POA: Diagnosis not present

## 2022-02-25 DIAGNOSIS — M9905 Segmental and somatic dysfunction of pelvic region: Secondary | ICD-10-CM | POA: Diagnosis not present

## 2022-02-25 DIAGNOSIS — M5431 Sciatica, right side: Secondary | ICD-10-CM | POA: Diagnosis not present

## 2022-02-25 DIAGNOSIS — M5137 Other intervertebral disc degeneration, lumbosacral region: Secondary | ICD-10-CM | POA: Diagnosis not present

## 2022-02-25 DIAGNOSIS — M5386 Other specified dorsopathies, lumbar region: Secondary | ICD-10-CM | POA: Diagnosis not present

## 2022-03-01 ENCOUNTER — Ambulatory Visit: Payer: Medicare Other | Admitting: Cardiology

## 2022-03-05 DIAGNOSIS — G2581 Restless legs syndrome: Secondary | ICD-10-CM | POA: Diagnosis not present

## 2022-03-05 DIAGNOSIS — R269 Unspecified abnormalities of gait and mobility: Secondary | ICD-10-CM | POA: Diagnosis not present

## 2022-03-05 DIAGNOSIS — M2569 Stiffness of other specified joint, not elsewhere classified: Secondary | ICD-10-CM | POA: Diagnosis not present

## 2022-03-05 DIAGNOSIS — M62551 Muscle wasting and atrophy, not elsewhere classified, right thigh: Secondary | ICD-10-CM | POA: Diagnosis not present

## 2022-03-05 DIAGNOSIS — F330 Major depressive disorder, recurrent, mild: Secondary | ICD-10-CM | POA: Diagnosis not present

## 2022-03-05 DIAGNOSIS — M5431 Sciatica, right side: Secondary | ICD-10-CM | POA: Diagnosis not present

## 2022-03-05 DIAGNOSIS — M5416 Radiculopathy, lumbar region: Secondary | ICD-10-CM | POA: Diagnosis not present

## 2022-03-08 ENCOUNTER — Encounter: Payer: Self-pay | Admitting: Cardiology

## 2022-03-08 ENCOUNTER — Ambulatory Visit: Payer: Medicare Other | Admitting: Cardiology

## 2022-03-08 VITALS — BP 130/68 | HR 82 | Temp 98.3°F | Resp 17 | Ht 63.0 in | Wt 185.6 lb

## 2022-03-08 DIAGNOSIS — I6522 Occlusion and stenosis of left carotid artery: Secondary | ICD-10-CM | POA: Diagnosis not present

## 2022-03-08 DIAGNOSIS — Z954 Presence of other heart-valve replacement: Secondary | ICD-10-CM | POA: Diagnosis not present

## 2022-03-08 DIAGNOSIS — I5032 Chronic diastolic (congestive) heart failure: Secondary | ICD-10-CM | POA: Diagnosis not present

## 2022-03-08 DIAGNOSIS — Q231 Congenital insufficiency of aortic valve: Secondary | ICD-10-CM

## 2022-03-08 DIAGNOSIS — I10 Essential (primary) hypertension: Secondary | ICD-10-CM

## 2022-03-08 MED ORDER — FUROSEMIDE 20 MG PO TABS: 20.0000 mg | ORAL_TABLET | Freq: Every day | ORAL | 2 refills | Status: DC | PRN

## 2022-03-09 DIAGNOSIS — R269 Unspecified abnormalities of gait and mobility: Secondary | ICD-10-CM | POA: Diagnosis not present

## 2022-03-09 DIAGNOSIS — M5416 Radiculopathy, lumbar region: Secondary | ICD-10-CM | POA: Diagnosis not present

## 2022-03-09 DIAGNOSIS — M2569 Stiffness of other specified joint, not elsewhere classified: Secondary | ICD-10-CM | POA: Diagnosis not present

## 2022-03-09 DIAGNOSIS — M62551 Muscle wasting and atrophy, not elsewhere classified, right thigh: Secondary | ICD-10-CM | POA: Diagnosis not present

## 2022-03-12 DIAGNOSIS — M5416 Radiculopathy, lumbar region: Secondary | ICD-10-CM | POA: Diagnosis not present

## 2022-03-12 DIAGNOSIS — M62551 Muscle wasting and atrophy, not elsewhere classified, right thigh: Secondary | ICD-10-CM | POA: Diagnosis not present

## 2022-03-12 DIAGNOSIS — R269 Unspecified abnormalities of gait and mobility: Secondary | ICD-10-CM | POA: Diagnosis not present

## 2022-03-12 DIAGNOSIS — M2569 Stiffness of other specified joint, not elsewhere classified: Secondary | ICD-10-CM | POA: Diagnosis not present

## 2022-03-15 DIAGNOSIS — M2569 Stiffness of other specified joint, not elsewhere classified: Secondary | ICD-10-CM | POA: Diagnosis not present

## 2022-03-15 DIAGNOSIS — M5416 Radiculopathy, lumbar region: Secondary | ICD-10-CM | POA: Diagnosis not present

## 2022-03-15 DIAGNOSIS — R269 Unspecified abnormalities of gait and mobility: Secondary | ICD-10-CM | POA: Diagnosis not present

## 2022-03-15 DIAGNOSIS — M62551 Muscle wasting and atrophy, not elsewhere classified, right thigh: Secondary | ICD-10-CM | POA: Diagnosis not present

## 2022-03-18 DIAGNOSIS — M5416 Radiculopathy, lumbar region: Secondary | ICD-10-CM | POA: Diagnosis not present

## 2022-03-18 DIAGNOSIS — R269 Unspecified abnormalities of gait and mobility: Secondary | ICD-10-CM | POA: Diagnosis not present

## 2022-03-18 DIAGNOSIS — M2569 Stiffness of other specified joint, not elsewhere classified: Secondary | ICD-10-CM | POA: Diagnosis not present

## 2022-03-18 DIAGNOSIS — M62551 Muscle wasting and atrophy, not elsewhere classified, right thigh: Secondary | ICD-10-CM | POA: Diagnosis not present

## 2022-03-23 DIAGNOSIS — M62551 Muscle wasting and atrophy, not elsewhere classified, right thigh: Secondary | ICD-10-CM | POA: Diagnosis not present

## 2022-03-23 DIAGNOSIS — M5416 Radiculopathy, lumbar region: Secondary | ICD-10-CM | POA: Diagnosis not present

## 2022-03-23 DIAGNOSIS — R269 Unspecified abnormalities of gait and mobility: Secondary | ICD-10-CM | POA: Diagnosis not present

## 2022-03-23 DIAGNOSIS — M2569 Stiffness of other specified joint, not elsewhere classified: Secondary | ICD-10-CM | POA: Diagnosis not present

## 2022-03-26 DIAGNOSIS — R269 Unspecified abnormalities of gait and mobility: Secondary | ICD-10-CM | POA: Diagnosis not present

## 2022-03-26 DIAGNOSIS — M5416 Radiculopathy, lumbar region: Secondary | ICD-10-CM | POA: Diagnosis not present

## 2022-03-26 DIAGNOSIS — M62551 Muscle wasting and atrophy, not elsewhere classified, right thigh: Secondary | ICD-10-CM | POA: Diagnosis not present

## 2022-03-26 DIAGNOSIS — M2569 Stiffness of other specified joint, not elsewhere classified: Secondary | ICD-10-CM | POA: Diagnosis not present

## 2022-03-30 DIAGNOSIS — M5416 Radiculopathy, lumbar region: Secondary | ICD-10-CM | POA: Diagnosis not present

## 2022-03-30 DIAGNOSIS — R269 Unspecified abnormalities of gait and mobility: Secondary | ICD-10-CM | POA: Diagnosis not present

## 2022-03-30 DIAGNOSIS — M62551 Muscle wasting and atrophy, not elsewhere classified, right thigh: Secondary | ICD-10-CM | POA: Diagnosis not present

## 2022-03-30 DIAGNOSIS — M2569 Stiffness of other specified joint, not elsewhere classified: Secondary | ICD-10-CM | POA: Diagnosis not present

## 2022-04-02 DIAGNOSIS — M62551 Muscle wasting and atrophy, not elsewhere classified, right thigh: Secondary | ICD-10-CM | POA: Diagnosis not present

## 2022-04-02 DIAGNOSIS — M2569 Stiffness of other specified joint, not elsewhere classified: Secondary | ICD-10-CM | POA: Diagnosis not present

## 2022-04-02 DIAGNOSIS — R269 Unspecified abnormalities of gait and mobility: Secondary | ICD-10-CM | POA: Diagnosis not present

## 2022-04-02 DIAGNOSIS — M5416 Radiculopathy, lumbar region: Secondary | ICD-10-CM | POA: Diagnosis not present

## 2022-04-06 DIAGNOSIS — M2569 Stiffness of other specified joint, not elsewhere classified: Secondary | ICD-10-CM | POA: Diagnosis not present

## 2022-04-06 DIAGNOSIS — M62551 Muscle wasting and atrophy, not elsewhere classified, right thigh: Secondary | ICD-10-CM | POA: Diagnosis not present

## 2022-04-06 DIAGNOSIS — M5416 Radiculopathy, lumbar region: Secondary | ICD-10-CM | POA: Diagnosis not present

## 2022-04-06 DIAGNOSIS — R269 Unspecified abnormalities of gait and mobility: Secondary | ICD-10-CM | POA: Diagnosis not present

## 2022-04-13 DIAGNOSIS — M2569 Stiffness of other specified joint, not elsewhere classified: Secondary | ICD-10-CM | POA: Diagnosis not present

## 2022-04-13 DIAGNOSIS — M5416 Radiculopathy, lumbar region: Secondary | ICD-10-CM | POA: Diagnosis not present

## 2022-04-13 DIAGNOSIS — M62551 Muscle wasting and atrophy, not elsewhere classified, right thigh: Secondary | ICD-10-CM | POA: Diagnosis not present

## 2022-04-13 DIAGNOSIS — R269 Unspecified abnormalities of gait and mobility: Secondary | ICD-10-CM | POA: Diagnosis not present

## 2022-04-16 DIAGNOSIS — M62551 Muscle wasting and atrophy, not elsewhere classified, right thigh: Secondary | ICD-10-CM | POA: Diagnosis not present

## 2022-04-16 DIAGNOSIS — M2569 Stiffness of other specified joint, not elsewhere classified: Secondary | ICD-10-CM | POA: Diagnosis not present

## 2022-04-16 DIAGNOSIS — R269 Unspecified abnormalities of gait and mobility: Secondary | ICD-10-CM | POA: Diagnosis not present

## 2022-04-16 DIAGNOSIS — M5416 Radiculopathy, lumbar region: Secondary | ICD-10-CM | POA: Diagnosis not present

## 2022-04-21 DIAGNOSIS — E039 Hypothyroidism, unspecified: Secondary | ICD-10-CM | POA: Diagnosis not present

## 2022-04-21 DIAGNOSIS — E7800 Pure hypercholesterolemia, unspecified: Secondary | ICD-10-CM | POA: Diagnosis not present

## 2022-04-21 DIAGNOSIS — Z9641 Presence of insulin pump (external) (internal): Secondary | ICD-10-CM | POA: Diagnosis not present

## 2022-04-21 DIAGNOSIS — E1065 Type 1 diabetes mellitus with hyperglycemia: Secondary | ICD-10-CM | POA: Diagnosis not present

## 2022-04-21 DIAGNOSIS — I10 Essential (primary) hypertension: Secondary | ICD-10-CM | POA: Diagnosis not present

## 2022-05-11 ENCOUNTER — Telehealth: Payer: Medicare Other | Admitting: Adult Health

## 2022-05-12 NOTE — Progress Notes (Unsigned)
PATIENT: [name] DOB: 11-01-1950  REASON FOR VISIT: follow up HISTORY FROM: patient PRIMARY NEUROLOGIST: Dr. Vickey Huger  Chief Complaint  Patient presents with   Rm 4    Here alone for CPAP f/u; She loves her CPAP and doesn't have any issues with it. ESS 2.     HISTORY OF PRESENT ILLNESS: Today 05/13/22:  [name] is a 71 year old female with a history of obstructive sleep apnea on CPAP.  Today for follow-up.  She has switched to a new DME company.  This is working out much better for her.  She now has a new machine and states that she "loves it."  Denies any new symptoms.  She returns today for an evaluation.    REVIEW OF SYSTEMS: Out of a complete 14 system review of symptoms, the patient complains only of the following symptoms, and all other reviewed systems are negative.  ESS 2  ALLERGIES: Allergies  Allergen Reactions   Penicillins Anaphylaxis    Has patient had a PCN reaction causing immediate rash, facial/tongue/throat swelling, SOB or lightheadedness with hypotension: No Has patient had a PCN reaction causing severe rash involving mucus membranes or skin necrosis: No Has patient had a PCN reaction that required hospitalization: Yes/ Has patient had a PCN reaction occurring within the last 10 years: No If all of the above answers are "NO", then may proceed with Cephalosporin use.    Ace Inhibitors     INDUCED BRONCHOSPASM   Contrast Media [Iodinated Contrast Media]     HOME MEDICATIONS: Outpatient Medications Prior to Visit  Medication Sig Dispense Refill   acetaminophen (TYLENOL) 500 MG tablet Take 500 mg by mouth every 6 (six) hours as needed for headache.     albuterol (PROVENTIL HFA;VENTOLIN HFA) 108 (90 Base) MCG/ACT inhaler Inhale 2 puffs into the lungs every 6 (six) hours as needed. (Patient taking differently: Inhale 2 puffs into the lungs every 6 (six) hours as needed (Asthma).) 8.5 g 11   albuterol (PROVENTIL) (2.5 MG/3ML) 0.083%  nebulizer solution Take 3 mLs (2.5 mg total) by nebulization every 6 (six) hours as needed. ZOX09:U04.540 (Patient taking differently: Take 2.5 mg by nebulization every 6 (six) hours as needed (Asthma). JWJ19:J47.829) 75 mL 1   ALPRAZolam (XANAX) 1 MG tablet TAKE ONE-HALF TABLET BY MOUTH ONCE DAILY AS NEEDED FOR STRESS (Patient taking differently: Take 0.5-1 mg by mouth daily as needed for anxiety.) 90 tablet 0   amLODipine (NORVASC) 10 MG tablet Take by mouth.     aspirin (ASPIRIN CHILDRENS) 81 MG chewable tablet Chew 1 tablet (81 mg total) by mouth daily.     atorvastatin (LIPITOR) 40 MG tablet Take 1 tablet (40 mg total) daily by mouth. 90 tablet 3   augmented betamethasone dipropionate (DIPROLENE-AF) 0.05 % cream Apply 1 application topically 2 (two) times daily as needed (eczema).     Biotin 56213 MCG TABS Take 10,000 mcg by mouth daily.     Cholecalciferol (VITAMIN D) 50 MCG (2000 UT) CAPS Take 0.5 capsules (1,000 Units total) by mouth daily. (Patient taking differently: Take 2,000 Units by mouth daily.)     Cyanocobalamin (B-12) 5000 MCG CAPS Take 5,000 mcg by mouth daily.     diltiazem (CARDIZEM CD) 240 MG 24 hr capsule TAKE 1 CAPSULE EVERY DAY 90 capsule 1   famotidine (PEPCID) 20 MG tablet Take 20 mg by mouth 2 (two) times daily.     FIASP 100 UNIT/ML SOLN Inject into the skin.  furosemide (LASIX) 20 MG tablet Take 1 tablet (20 mg total) by mouth daily as needed for fluid. 30 tablet 2   Glucose 15 g PACK Take 15-30 g by mouth daily as needed (low blood sugar).     L-Lysine 500 MG CAPS Take 500 mg by mouth daily.     levocetirizine (XYZAL ALLERGY 24HR) 5 MG tablet Take 1 tablet (5 mg total) by mouth every evening. 30 tablet 0   levothyroxine (SYNTHROID) 125 MCG tablet Take 125 mcg by mouth daily before breakfast.     losartan-hydrochlorothiazide (HYZAAR) 100-25 MG tablet Take 1 tablet by mouth every morning. 90 tablet 3   Magnesium 500 MG TABS Take 500 mg by mouth at bedtime.      Melatonin 10 MG TABS Take 10 mg by mouth at bedtime.     olopatadine (PATANOL) 0.1 % ophthalmic solution 1 drop 2 (two) times daily.     omeprazole (PRILOSEC) 40 MG capsule Take 40 mg by mouth daily.     OVER THE COUNTER MEDICATION Take 4 tablets by mouth at bedtime. Focus factor otc supplement     Probiotic Product (PROBIOTIC PO) Take 1 capsule by mouth daily. 20 billion active cultures     rOPINIRole (REQUIP) 1 MG tablet Take 1 mg by mouth at bedtime.     sertraline (ZOLOFT) 100 MG tablet Take 100 mg by mouth daily.     simethicone (GAS-X) 80 MG chewable tablet Chew 1 tablet (80 mg total) by mouth 4 (four) times daily as needed for flatulence. 100 tablet 2   No facility-administered medications prior to visit.    PAST MEDICAL HISTORY: Past Medical History:  Diagnosis Date   Anxiety    Aortic regurgitation    Arthritis    Asthma    Depression    Depression    Diabetes mellitus    Dyspnea    Facet joint disease of lumbosacral region    Fibromyalgia    GERD (gastroesophageal reflux disease)    Goiter    Hair loss    Heart murmur    Hypertension    Menopause    Mitral regurgitation    Mitral valve prolapse    Other and unspecified hyperlipidemia    Positive PPD    Sleep apnea    Small bowel obstruction (HCC) 02/09/2018   SVT (supraventricular tachycardia) (HCC)    Vasculitis (HCC)     PAST SURGICAL HISTORY: Past Surgical History:  Procedure Laterality Date   ABDOMINAL HYSTERECTOMY     AORTIC VALVE REPLACEMENT N/A 06/05/2021   Procedure: AORTIC VALVE REPLACEMENT (AVR), USING INSPIRIS RESILIA  AORTIC VALVE;  Surgeon: Alleen Borne, MD;  Location: [facility] OR;  Service: Open Heart Surgery;  Laterality: N/A;   APPENDECTOMY     CARDIAC CATHETERIZATION     CHOLECYSTECTOMY     DIAGNOSTIC LAPAROSCOPY     EYE SURGERY Bilateral    cataract removal   PARTIAL HYSTERECTOMY     RIGHT/LEFT HEART CATH AND CORONARY ANGIOGRAPHY N/A 04/28/2021   Procedure: RIGHT/LEFT HEART CATH AND  CORONARY ANGIOGRAPHY;  Surgeon: Yates Decamp, MD;  Location: [facility];  Service: Cardiovascular;  Laterality: N/A;   TEE WITHOUT CARDIOVERSION N/A 06/05/2021   Procedure: TRANSESOPHAGEAL ECHOCARDIOGRAM (TEE);  Surgeon: Alleen Borne, MD;  Location: [facility] OR;  Service: Open Heart Surgery;  Laterality: N/A;    FAMILY HISTORY: Family History  Problem Relation Age of Onset   Hepatitis Mother 66       died from  Hep A   Diabetes Father    Sleep apnea Father    Cancer Brother    Crohn's disease Brother    Alcohol abuse Brother    Sleep apnea Brother     SOCIAL HISTORY: Social History   Socioeconomic History   Marital status: Married    Spouse name: Not on file   Number of children: 2   Years of education: Not on file   Highest education level: Not on file  Occupational History   Not on file  Tobacco Use   Smoking status: Former    Packs/day: 0.25    Years: 15.00    Total pack years: 3.75    Types: Cigarettes    Quit date: 03/13/1988    Years since quitting: 34.1   Smokeless tobacco: Never  Vaping Use   Vaping Use: Never used  Substance and Sexual Activity   Alcohol use: No   Drug use: No   Sexual activity: Not Currently  Other Topics Concern   Not on file  Social History Narrative   Caffeine 1-2 cups coffee daily.   Lives at home with husband    Retired.   Associates degree.   Has 4 kids (2 Human resources officer)   Social Determinants of Corporate investment banker Strain: Not on file  Food Insecurity: Not on file  Transportation Needs: Not on file  Physical Activity: Not on file  Stress: Not on file  Social Connections: Not on file  Intimate Partner Violence: Not on file      PHYSICAL EXAM  Vitals:   05/13/22 1252  BP: (!) 156/64  Pulse: 66  Weight: 181 lb (82.1 kg)  Height: 5\' 3"  (1.6 m)   Body mass index is 32.06 kg/m.  Generalized: Well developed, in no acute distress  Chest: Lungs clear to auscultation bilaterally  Neurological examination   Mentation: Alert oriented to time, place, history taking. Follows all commands speech and language fluent Cranial nerve II-XII: Extraocular movements were full, visual field were full on confrontational test Head turning and shoulder shrug  were normal and symmetric. Motor: The motor testing reveals 5 over 5 strength of all 4 extremities. Good symmetric motor tone is noted throughout.  Sensory: Sensory testing is intact to soft touch on all 4 extremities. No evidence of extinction is noted.  Gait and station: Gait is normal.    DIAGNOSTIC DATA (LABS, IMAGING, TESTING) - I reviewed patient records, labs, notes, testing and imaging myself where available.  Lab Results  Component Value Date   WBC 17.3 (H) 06/08/2021   HGB 10.3 (L) 06/08/2021   HCT 32.2 (L) 06/08/2021   MCV 88.5 06/08/2021   PLT 148 (L) 06/08/2021      Component Value Date/Time   NA 135 06/13/2021 0435   K 4.4 06/13/2021 0435   CL 101 06/13/2021 0435   CO2 25 06/13/2021 0435   GLUCOSE 169 (H) 06/13/2021 0435   BUN 9 06/13/2021 0435   CREATININE 0.87 06/13/2021 0435   CREATININE 0.97 02/09/2018 1051   CALCIUM 9.3 06/13/2021 0435   PROT 7.2 06/03/2021 1236   ALBUMIN 3.8 06/03/2021 1236   AST 27 06/03/2021 1236   ALT 19 06/03/2021 1236   ALKPHOS 50 06/03/2021 1236   BILITOT 1.1 06/03/2021 1236   GFRNONAA >60 06/13/2021 0435   GFRNONAA 60 02/09/2018 1051   GFRAA >60 02/11/2018 0404   GFRAA 70 02/09/2018 1051   Lab Results  Component Value Date   CHOL 147 01/09/2018  HDL 40 (L) 01/09/2018   LDLCALC 74 01/09/2018   TRIG 252 (H) 01/09/2018   CHOLHDL 3.7 01/09/2018   Lab Results  Component Value Date   HGBA1C 7.1 (H) 06/03/2021   Lab Results  Component Value Date   VITAMINB12 626 10/21/2006   Lab Results  Component Value Date   TSH 1.82 01/09/2018      ASSESSMENT AND PLAN 71 y.o. year old female  has a past medical history of Anxiety, Aortic regurgitation, Arthritis, Asthma, Depression,  Depression, Diabetes mellitus, Dyspnea, Facet joint disease of lumbosacral region, Fibromyalgia, GERD (gastroesophageal reflux disease), Goiter, Hair loss, Heart murmur, Hypertension, Menopause, Mitral regurgitation, Mitral valve prolapse, Other and unspecified hyperlipidemia, Positive PPD, Sleep apnea, Small bowel obstruction (HCC) (02/09/2018), SVT (supraventricular tachycardia) (HCC), and Vasculitis (HCC). here with:  OSA on CPAP  - CPAP compliance excellent - Good treatment of AHI  - Encourage patient to use CPAP nightly and > 4 hours each night - F/U in 1 year or sooner if needed   Butch Penny, MSN, NP-C 05/13/2022, 12:59 PM [facility] [address] [place], [place] [address] [phone]

## 2022-05-13 ENCOUNTER — Encounter: Payer: Self-pay | Admitting: Adult Health

## 2022-05-13 ENCOUNTER — Ambulatory Visit (INDEPENDENT_AMBULATORY_CARE_PROVIDER_SITE_OTHER): Payer: Medicare Other | Admitting: Adult Health

## 2022-05-13 VITALS — BP 156/64 | HR 66 | Ht 63.0 in | Wt 181.0 lb

## 2022-05-13 DIAGNOSIS — G4733 Obstructive sleep apnea (adult) (pediatric): Secondary | ICD-10-CM

## 2022-05-13 DIAGNOSIS — Z9989 Dependence on other enabling machines and devices: Secondary | ICD-10-CM | POA: Diagnosis not present

## 2022-05-13 DIAGNOSIS — K219 Gastro-esophageal reflux disease without esophagitis: Secondary | ICD-10-CM | POA: Diagnosis not present

## 2022-05-13 DIAGNOSIS — I10 Essential (primary) hypertension: Secondary | ICD-10-CM | POA: Diagnosis not present

## 2022-05-13 DIAGNOSIS — E1065 Type 1 diabetes mellitus with hyperglycemia: Secondary | ICD-10-CM | POA: Diagnosis not present

## 2022-05-13 DIAGNOSIS — E7800 Pure hypercholesterolemia, unspecified: Secondary | ICD-10-CM | POA: Diagnosis not present

## 2022-05-13 NOTE — Patient Instructions (Signed)
Continue using CPAP nightly and greater than 4 hours each night °If your symptoms worsen or you develop new symptoms please let us know.  ° °

## 2022-06-03 ENCOUNTER — Other Ambulatory Visit: Payer: Self-pay | Admitting: Cardiology

## 2022-06-03 DIAGNOSIS — I5032 Chronic diastolic (congestive) heart failure: Secondary | ICD-10-CM

## 2022-06-23 IMAGING — CT CT ANGIO CHEST
2 of 7 series · 16 of 36 positions shown · IV contrast (APPLIED)
Comparison: CT the abdomen and pelvis 02/09/2018. No prior chest
CT.

CLINICAL DATA: 69-year-old female with history of severe aortic
stenosis. Preprocedural study prior to potential transcatheter
aortic valve replacement (TAVR) procedure.

EXAM:
CT CHEST, ABDOMEN AND PELVIS WITHOUT CONTRAST
TECHNIQUE: Multidetector CT imaging of the chest, abdomen and pelvis was
performed following the standard protocol without IV contrast.

[Series 6: ax thins · axial · 0.59mm/px · z∈[+811,+1451]mm · 15 of 720 slices shown]
[im 40/720  lung]
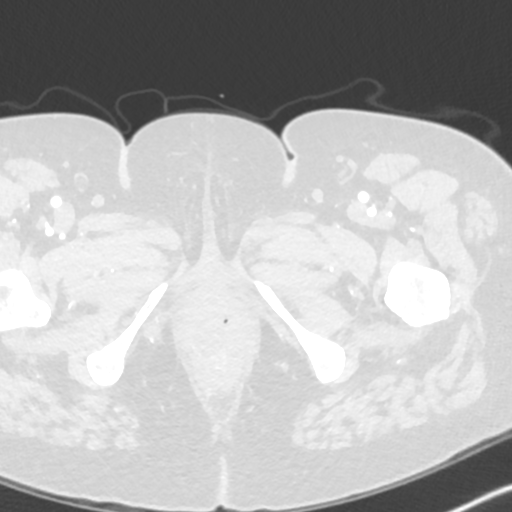
[im 80/720  mediastinal]
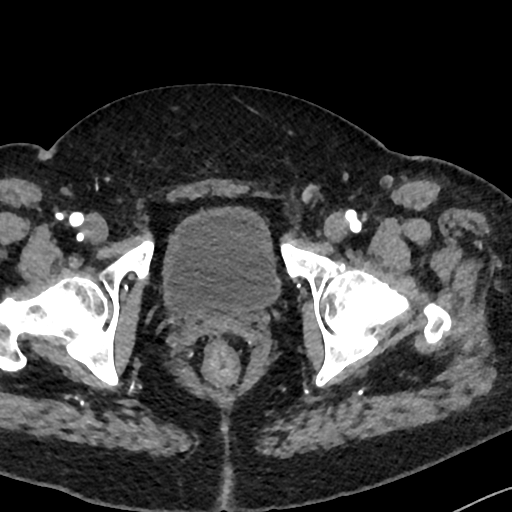
[im 120/720  lung]
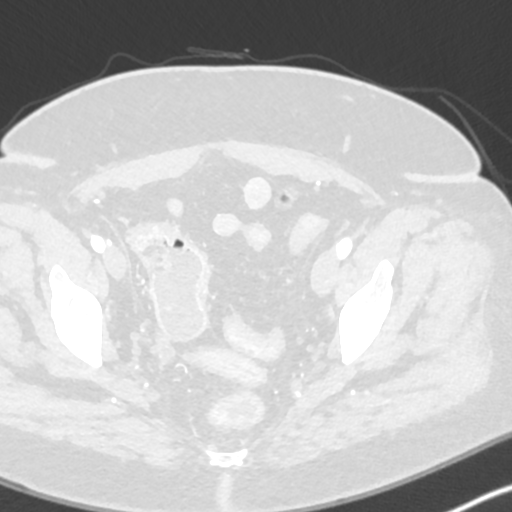
[im 160/720  mediastinal]
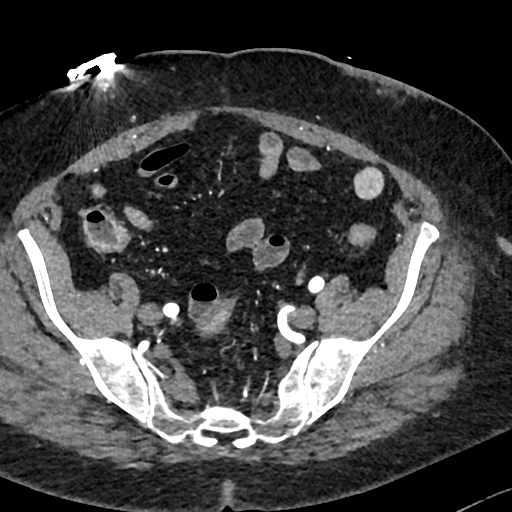
[im 240/720  lung]
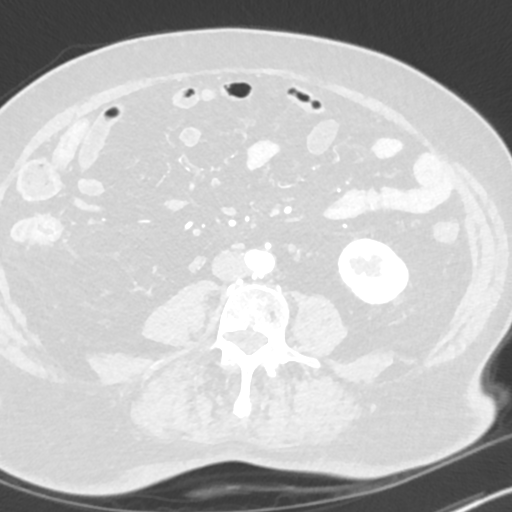
[im 280/720  mediastinal]
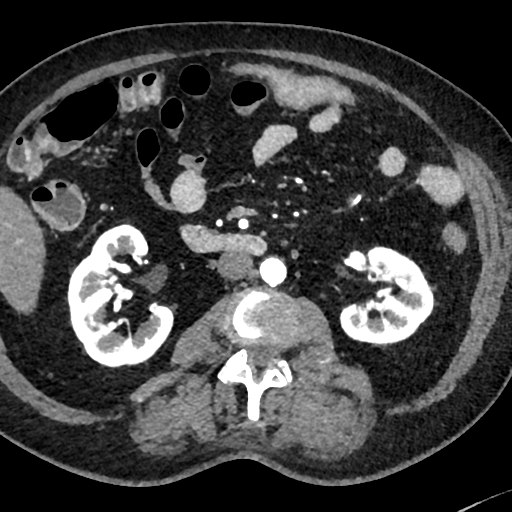
[im 320/720  lung]
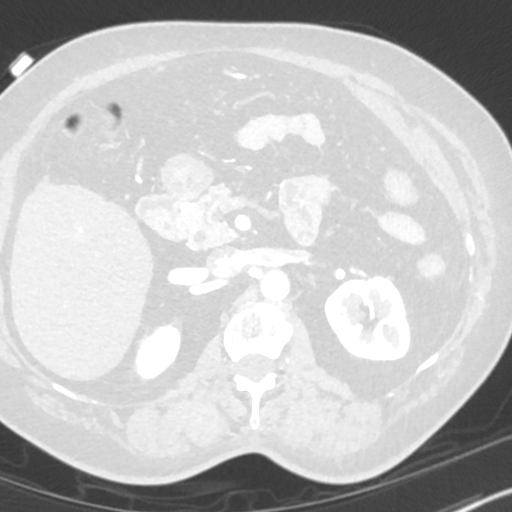
[im 360/720  mediastinal]
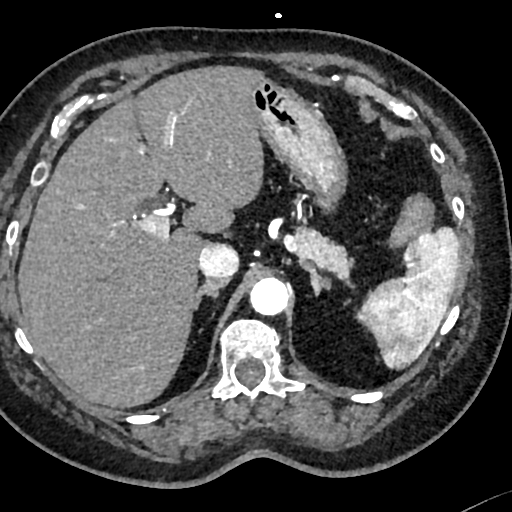
[im 400/720  lung]
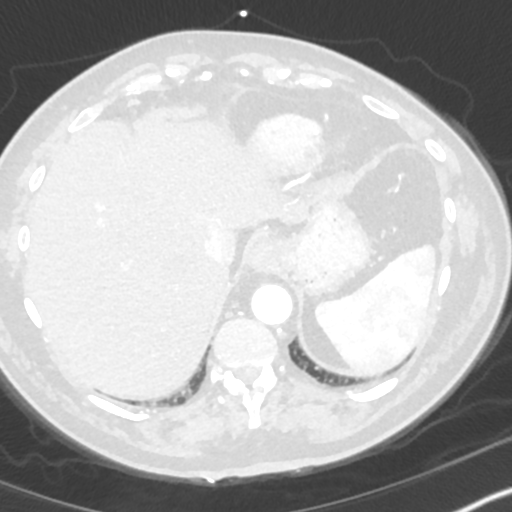
[im 440/720  mediastinal]
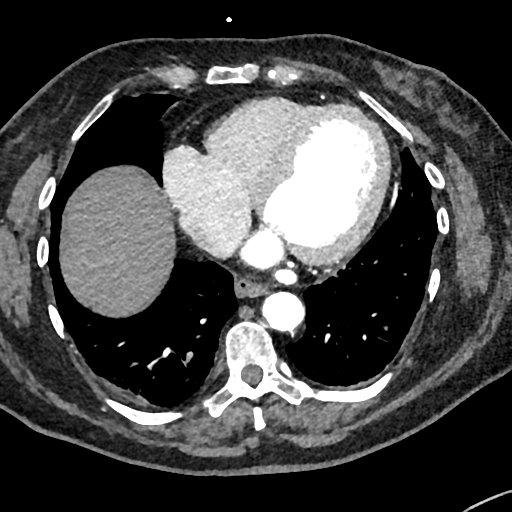
[im 480/720  lung]
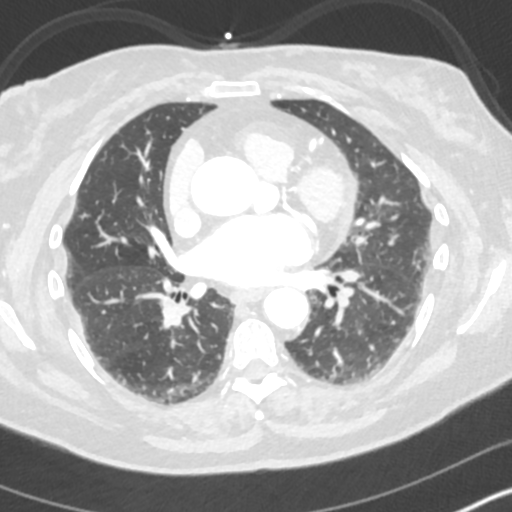
[im 560/720  mediastinal]
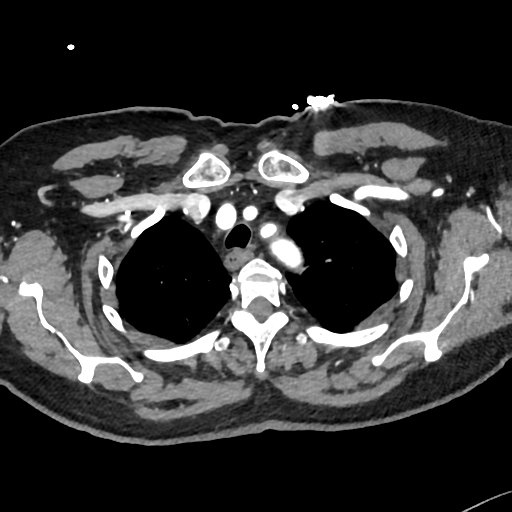
[im 600/720  lung]
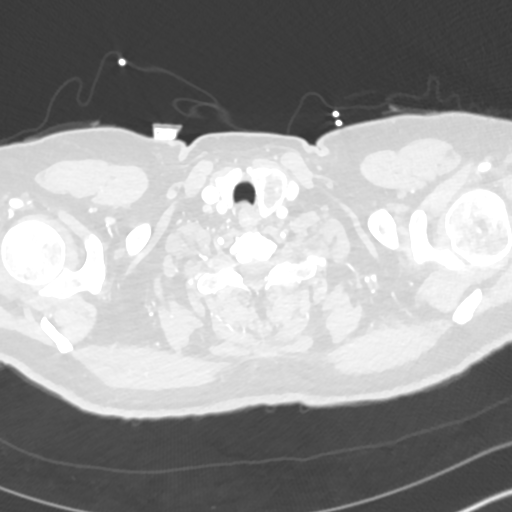
[im 640/720  mediastinal]
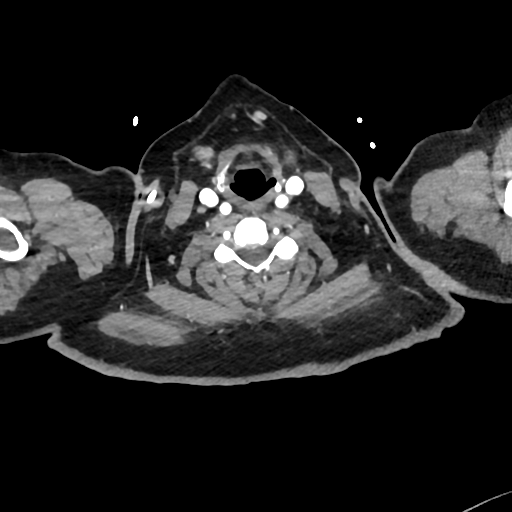
[im 680/720  lung]
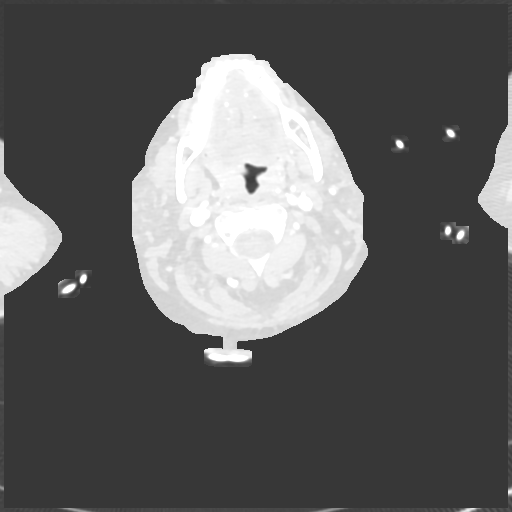

[Series 9: cor · coronal · 0.95mm/px · 1 of 146 slices shown]
[im 73/146  mediastinal]
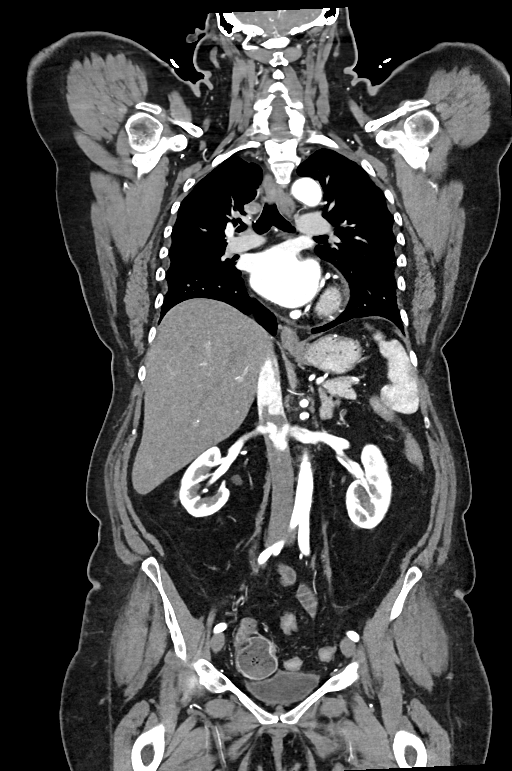

[16 of 36 positions shown; findings below may reference images not displayed]

FINDINGS: CTA CHEST FINDINGS

Cardiovascular: Heart size is normal. There is no significant
pericardial fluid, thickening or pericardial calcification. There is
aortic atherosclerosis, as well as atherosclerosis of the great
vessels of the mediastinum and the coronary arteries, including
calcified atherosclerotic plaque in the left anterior descending
coronary artery. Severe thickening and calcification of the aortic
valve.

Mediastinum/Lymph Nodes: No pathologically enlarged mediastinal or
hilar lymph nodes. Esophagus is unremarkable in appearance. Large
heterogeneous enhancing nodule in the left lobe of the thyroid gland
measuring 2.6 x 2.3 cm. No axillary lymphadenopathy.

Lungs/Pleura: No acute consolidative airspace disease. No pleural
effusions. No suspicious appearing pulmonary nodules or masses are
noted. Small amount of pleural thickening posteriorly in the right
hemithorax, similar in retrospect to prior CT the abdomen and pelvis
05/31/2008, presumably benign.

Musculoskeletal/Soft Tissues: There are no aggressive appearing
lytic or blastic lesions noted in the visualized portions of the
skeleton.

CTA ABDOMEN AND PELVIS FINDINGS

Hepatobiliary: Mild diffuse low attenuation throughout the hepatic
parenchyma, concerning for hepatic steatosis (difficult to say for
certain on today's contrast enhanced examination). No suspicious
cystic or solid hepatic lesions. No intra or extrahepatic biliary
ductal dilatation. Status post cholecystectomy.

Pancreas: No pancreatic mass. No pancreatic ductal dilatation. No
pancreatic or peripancreatic fluid collections or inflammatory
changes.

Spleen: Unremarkable.

Adrenals/Urinary Tract: Bilateral kidneys and bilateral adrenal
glands are normal in appearance. No hydroureteronephrosis. Urinary
bladder is normal in appearance.

Stomach/Bowel: The appearance of the stomach is normal. No
pathologic dilatation of small bowel or colon. The appendix is not
confidently identified and may be surgically absent. Regardless,
there are no inflammatory changes noted adjacent to the cecum to
suggest the presence of an acute appendicitis at this time.

Vascular/Lymphatic: Minimal aortic atherosclerosis, without evidence
of aneurysm or dissection in the abdominal or pelvic vasculature.
Vascular findings and measurements pertinent to potential TAVR
procedure, as detailed below. No lymphadenopathy noted in the
abdomen or pelvis.

Reproductive: Status post hysterectomy. Ovaries are not confidently
identified may be surgically absent or atrophic.

Other: No significant volume of ascites.  No pneumoperitoneum.

Musculoskeletal: Cavernous hemangioma in the L1 vertebral body
incidentally noted. There are no aggressive appearing lytic or
blastic lesions noted in the visualized portions of the skeleton.

VASCULAR MEASUREMENTS PERTINENT TO TAVR:

AORTA:

Minimal Aortic Qiameter-4X x 13 mm

Severity of Aortic Calcification-mild

RIGHT PELVIS:

Right Common Iliac Artery -

Minimal Kiameter-E.B x 9.3 mm

Tortuosity-mild

Calcification-none

Right External Iliac Artery -

Minimal 3iameter-9.1 x 7.5 mm

Tortuosity-moderate

Calcification-none

Right Common Femoral Artery -

Minimal 0iameter-Z.Z x 7.3 mm

Tortuosity-mild

Calcification-none

LEFT PELVIS:

Left Common Iliac Artery -

Minimal Aiameter-3.3 x 8.7 mm

Tortuosity-mild

Calcification-none

Left External Iliac Artery -

Minimal 3iameter-9.1 x 6.5 mm

Tortuosity-severe

Calcification-none

Left Common Femoral Artery -

Minimal Eiameter-C.1 x 7.5 mm

Tortuosity-mild

Calcification-none

Review of the MIP images confirms the above findings.
IMPRESSION: 1. Vascular findings and measurements pertinent to potential TAVR
procedure, as detailed above.
2. Severe thickening and calcification of the aortic valve,
compatible with reported clinical history of severe aortic stenosis.
3. Large heterogeneous enhancing nodule in the left lobe of the
thyroid gland measuring 2.6 x 2.3 cm Recommend thyroid US (ref: [HOSPITAL]. [DATE]): 143-50).
4. Probable hepatic steatosis.
5. Additional incidental findings, as above.

## 2022-07-26 DIAGNOSIS — E559 Vitamin D deficiency, unspecified: Secondary | ICD-10-CM | POA: Diagnosis not present

## 2022-07-26 DIAGNOSIS — K219 Gastro-esophageal reflux disease without esophagitis: Secondary | ICD-10-CM | POA: Diagnosis not present

## 2022-07-26 DIAGNOSIS — E7800 Pure hypercholesterolemia, unspecified: Secondary | ICD-10-CM | POA: Diagnosis not present

## 2022-07-26 DIAGNOSIS — I10 Essential (primary) hypertension: Secondary | ICD-10-CM | POA: Diagnosis not present

## 2022-07-28 DIAGNOSIS — Z23 Encounter for immunization: Secondary | ICD-10-CM | POA: Diagnosis not present

## 2022-07-28 DIAGNOSIS — E039 Hypothyroidism, unspecified: Secondary | ICD-10-CM | POA: Diagnosis not present

## 2022-07-28 DIAGNOSIS — E7800 Pure hypercholesterolemia, unspecified: Secondary | ICD-10-CM | POA: Diagnosis not present

## 2022-07-28 DIAGNOSIS — I10 Essential (primary) hypertension: Secondary | ICD-10-CM | POA: Diagnosis not present

## 2022-07-28 DIAGNOSIS — E049 Nontoxic goiter, unspecified: Secondary | ICD-10-CM | POA: Diagnosis not present

## 2022-07-28 DIAGNOSIS — E1065 Type 1 diabetes mellitus with hyperglycemia: Secondary | ICD-10-CM | POA: Diagnosis not present

## 2022-07-28 DIAGNOSIS — Z9641 Presence of insulin pump (external) (internal): Secondary | ICD-10-CM | POA: Diagnosis not present

## 2022-08-11 DIAGNOSIS — F419 Anxiety disorder, unspecified: Secondary | ICD-10-CM | POA: Diagnosis not present

## 2022-08-11 DIAGNOSIS — Z0000 Encounter for general adult medical examination without abnormal findings: Secondary | ICD-10-CM | POA: Diagnosis not present

## 2022-08-11 DIAGNOSIS — G4733 Obstructive sleep apnea (adult) (pediatric): Secondary | ICD-10-CM | POA: Diagnosis not present

## 2022-08-11 DIAGNOSIS — E876 Hypokalemia: Secondary | ICD-10-CM | POA: Diagnosis not present

## 2022-08-11 DIAGNOSIS — F3340 Major depressive disorder, recurrent, in remission, unspecified: Secondary | ICD-10-CM | POA: Diagnosis not present

## 2022-08-16 ENCOUNTER — Other Ambulatory Visit: Payer: Self-pay | Admitting: Cardiology

## 2022-08-18 IMAGING — CR DG CHEST 2V
2 series · 2 of 2 positions shown · non-contrast
Comparison: 06/10/2021

CLINICAL DATA: S/P AVR x 3 weeks ago. No symptoms.

EXAM:
CHEST - 2 VIEW

[w chest pa]
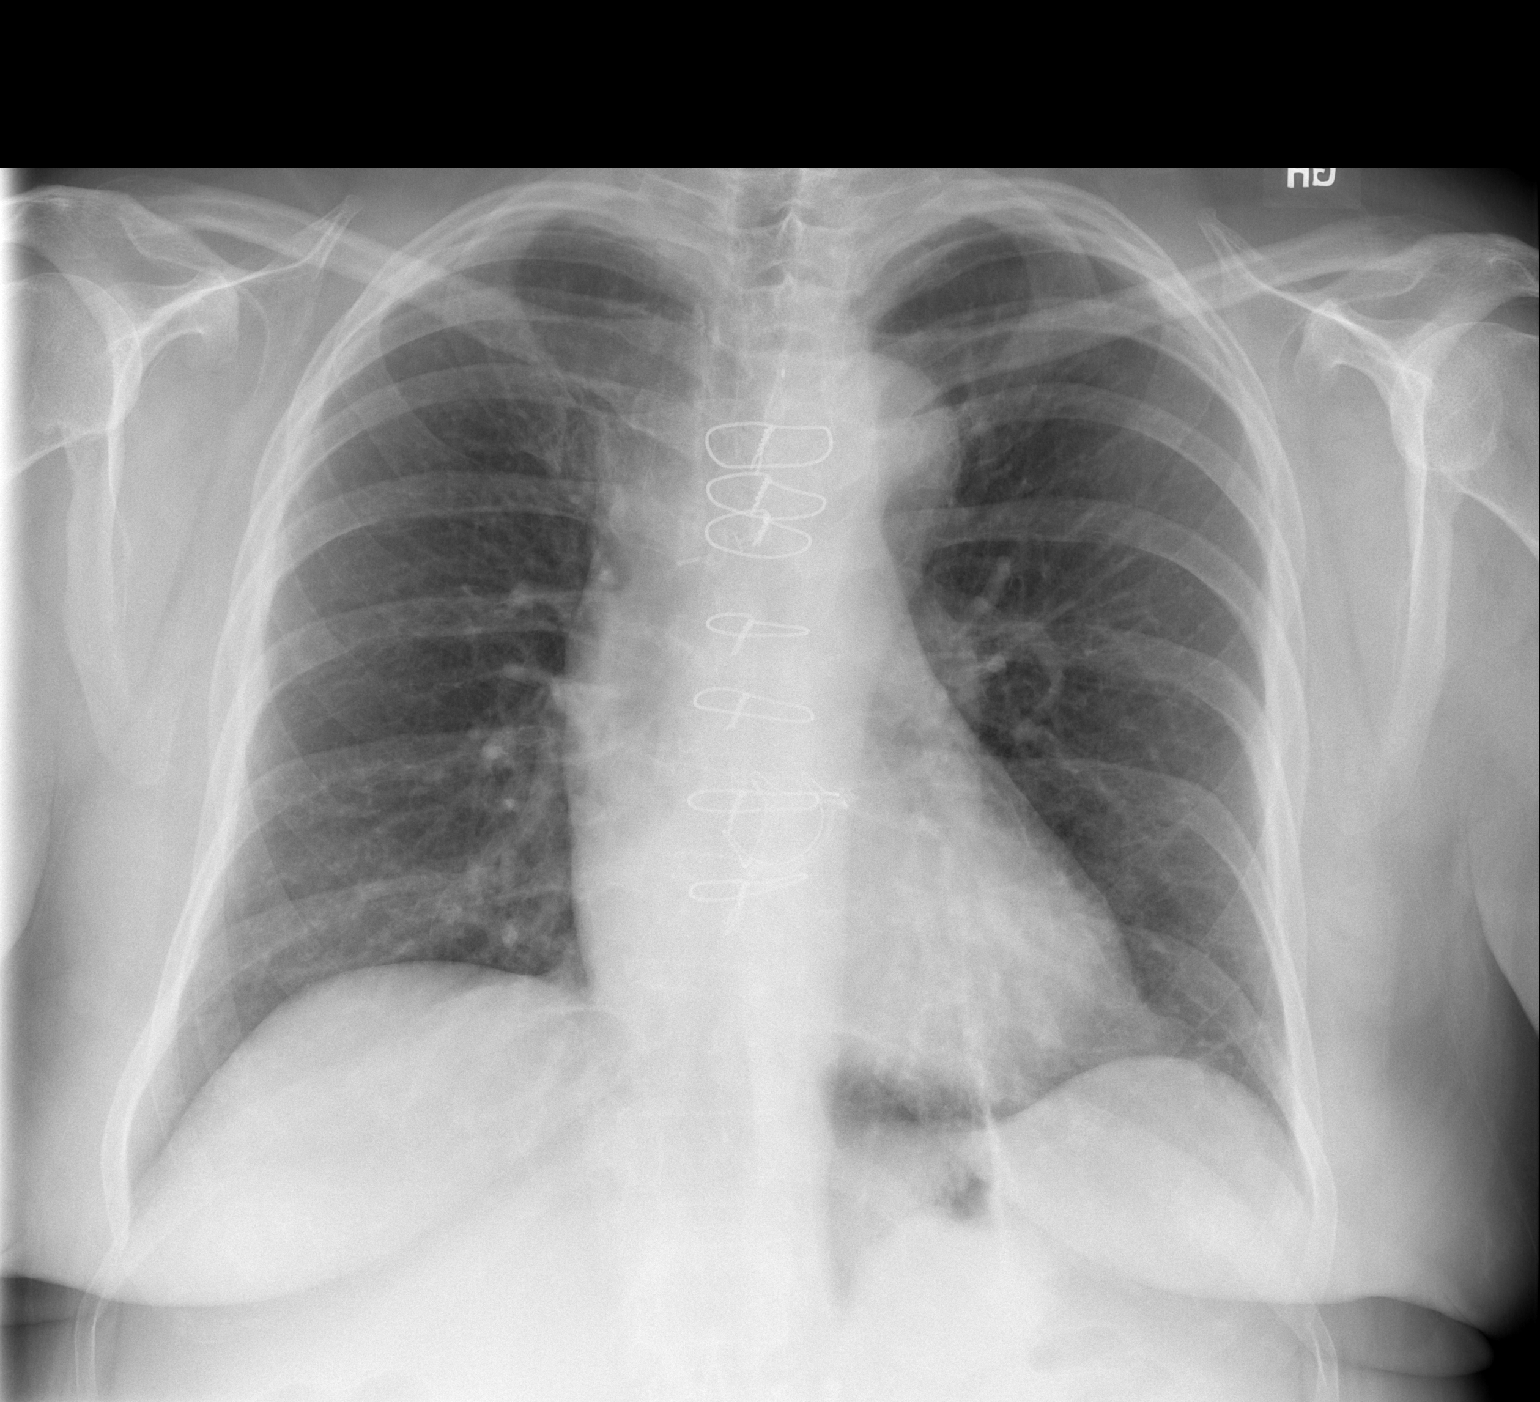

[w chest lat]
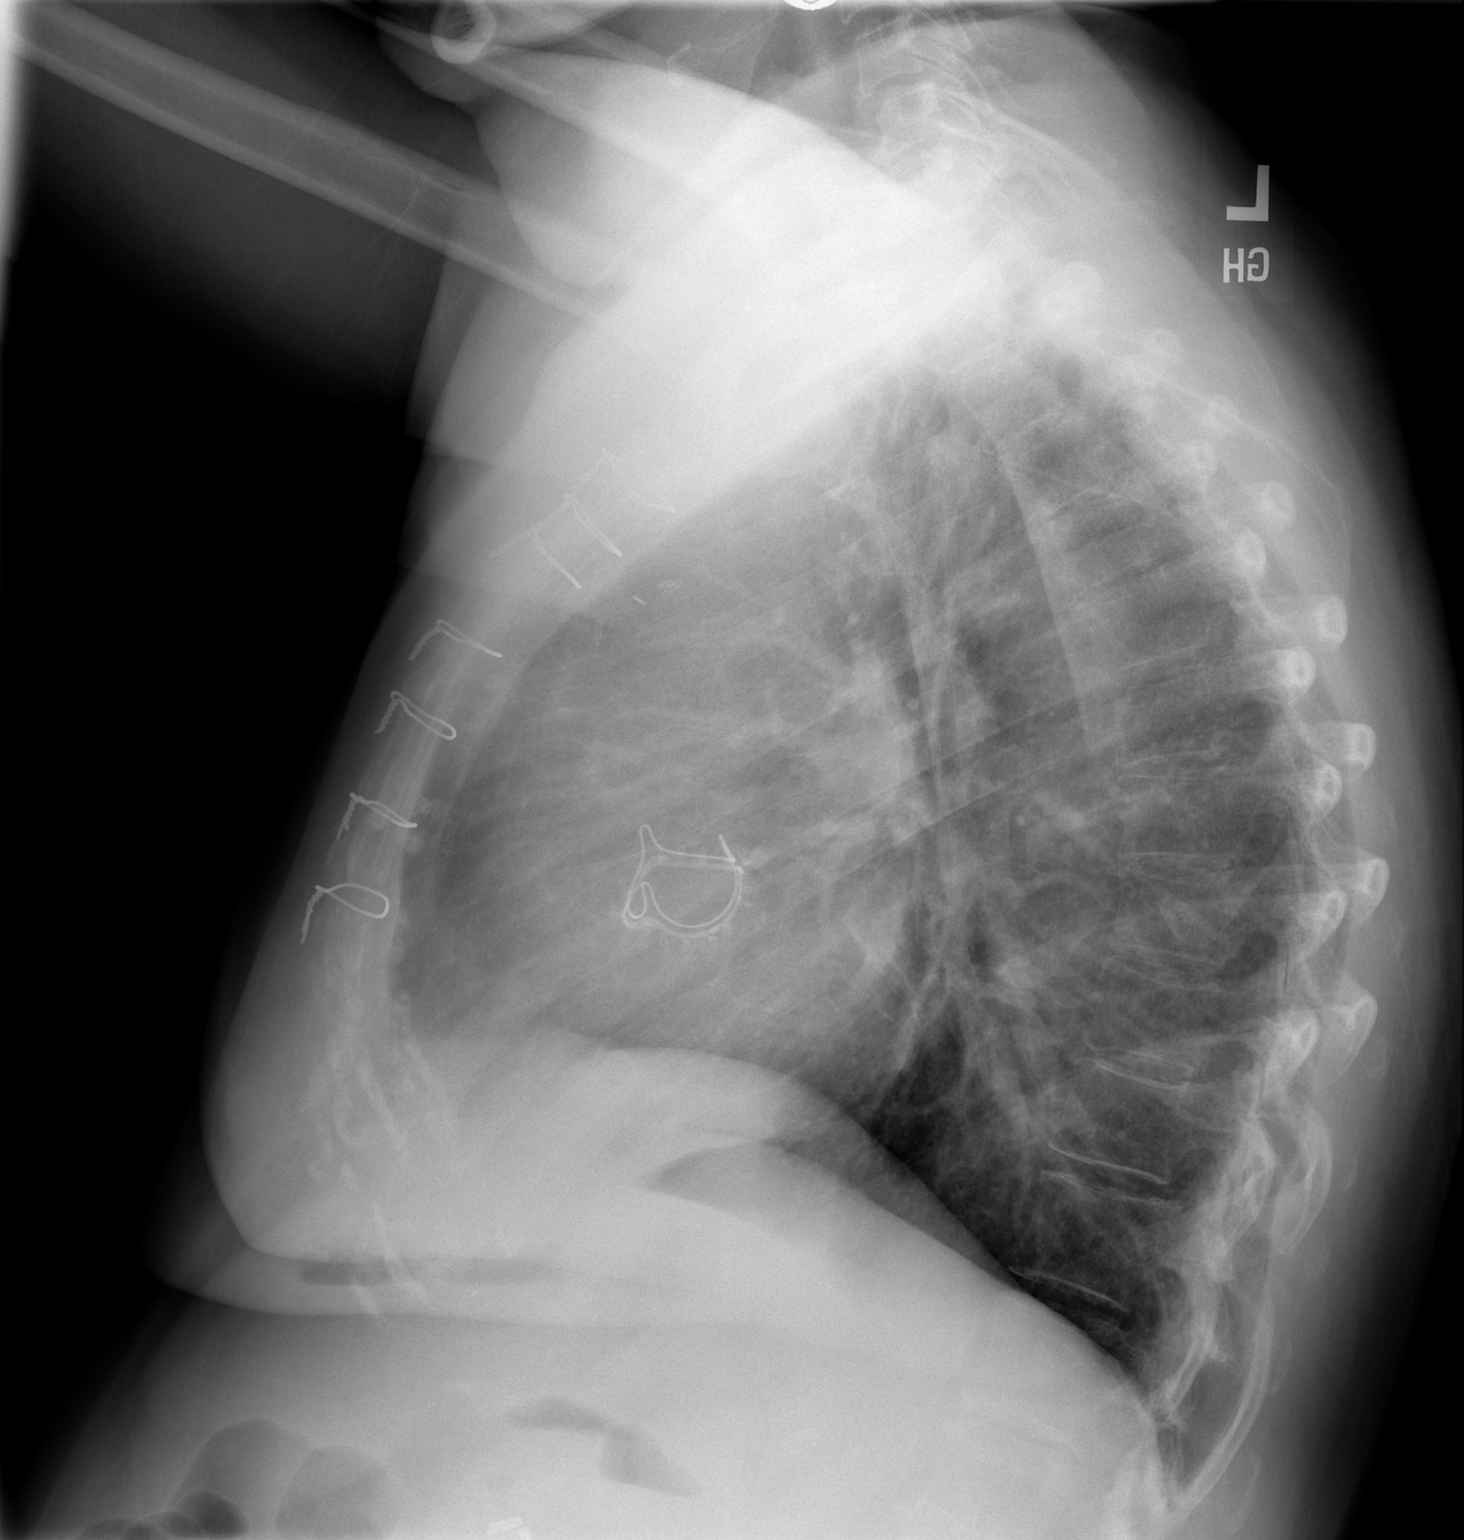

[2 of 2 positions shown; findings below may reference images not displayed]

FINDINGS: Lungs are clear.

Heart size and mediastinal contours are within normal limits. Post
AVR.

No effusion.

Visualized bones unremarkable.  Sternotomy wires.
IMPRESSION: No acute cardiopulmonary disease.

## 2022-11-04 ENCOUNTER — Other Ambulatory Visit: Payer: Self-pay

## 2022-11-04 DIAGNOSIS — I5032 Chronic diastolic (congestive) heart failure: Secondary | ICD-10-CM

## 2022-11-04 MED ORDER — FUROSEMIDE 20 MG PO TABS: 20.0000 mg | ORAL_TABLET | Freq: Every day | ORAL | 3 refills | Status: DC | PRN

## 2022-11-26 DIAGNOSIS — Z9641 Presence of insulin pump (external) (internal): Secondary | ICD-10-CM | POA: Diagnosis not present

## 2022-11-26 DIAGNOSIS — I10 Essential (primary) hypertension: Secondary | ICD-10-CM | POA: Diagnosis not present

## 2022-11-26 DIAGNOSIS — E559 Vitamin D deficiency, unspecified: Secondary | ICD-10-CM | POA: Diagnosis not present

## 2022-11-26 DIAGNOSIS — M4690 Unspecified inflammatory spondylopathy, site unspecified: Secondary | ICD-10-CM | POA: Diagnosis not present

## 2022-11-26 DIAGNOSIS — Z794 Long term (current) use of insulin: Secondary | ICD-10-CM | POA: Diagnosis not present

## 2022-11-26 DIAGNOSIS — E039 Hypothyroidism, unspecified: Secondary | ICD-10-CM | POA: Diagnosis not present

## 2022-11-26 DIAGNOSIS — E049 Nontoxic goiter, unspecified: Secondary | ICD-10-CM | POA: Diagnosis not present

## 2022-11-26 DIAGNOSIS — E7800 Pure hypercholesterolemia, unspecified: Secondary | ICD-10-CM | POA: Diagnosis not present

## 2022-11-26 DIAGNOSIS — E1065 Type 1 diabetes mellitus with hyperglycemia: Secondary | ICD-10-CM | POA: Diagnosis not present

## 2022-12-31 DIAGNOSIS — E109 Type 1 diabetes mellitus without complications: Secondary | ICD-10-CM | POA: Diagnosis not present

## 2023-01-07 DIAGNOSIS — R5383 Other fatigue: Secondary | ICD-10-CM | POA: Diagnosis not present

## 2023-01-07 DIAGNOSIS — E109 Type 1 diabetes mellitus without complications: Secondary | ICD-10-CM | POA: Diagnosis not present

## 2023-01-07 DIAGNOSIS — E039 Hypothyroidism, unspecified: Secondary | ICD-10-CM | POA: Diagnosis not present

## 2023-01-07 DIAGNOSIS — E7800 Pure hypercholesterolemia, unspecified: Secondary | ICD-10-CM | POA: Diagnosis not present

## 2023-01-07 DIAGNOSIS — E1065 Type 1 diabetes mellitus with hyperglycemia: Secondary | ICD-10-CM | POA: Diagnosis not present

## 2023-01-13 ENCOUNTER — Encounter: Payer: Self-pay | Admitting: Adult Health

## 2023-01-13 ENCOUNTER — Ambulatory Visit (INDEPENDENT_AMBULATORY_CARE_PROVIDER_SITE_OTHER): Payer: Medicare Other | Admitting: Adult Health

## 2023-01-13 VITALS — BP 151/64 | HR 69 | Ht 63.0 in | Wt 177.4 lb

## 2023-01-13 DIAGNOSIS — G4733 Obstructive sleep apnea (adult) (pediatric): Secondary | ICD-10-CM | POA: Diagnosis not present

## 2023-01-13 NOTE — Progress Notes (Signed)
PATIENT: [name] DOB: [birth year]  REASON FOR VISIT: follow up HISTORY FROM: patient PRIMARY NEUROLOGIST: Dr. Vickey Huger  Chief Complaint  Patient presents with   Follow-up    Pt in 18  Pt here for CPAP f/u Pt states no questions or concerns for today's visit      HISTORY OF PRESENT ILLNESS:  Today 01/13/23:  [name] is a 72 y.o. female with a history of OSA on CPAP. Returns today for follow-up. Reports that CPAP is working well. Denies any new symptoms. Continues to notice the benefit.       01/12/22: [name] is a 72 year old female with a history of obstructive sleep apnea on CPAP.  She returns today for follow-up.  In January an order was sent for new machine but she reports that she has not gotten one yet.  She has been in contact with [facility] but reports that they told her there was a supply issue?  She is okay with switching to another DME if they take her insurance.  Her download is below    REVIEW OF SYSTEMS: Out of a complete 14 system review of symptoms, the patient complains only of the following symptoms, and all other reviewed systems are negative.  FSS 22 ESS 4  ALLERGIES: Allergies  Allergen Reactions   Penicillins Anaphylaxis    Has patient had a PCN reaction causing immediate rash, facial/tongue/throat swelling, SOB or lightheadedness with hypotension: No Has patient had a PCN reaction causing severe rash involving mucus membranes or skin necrosis: No Has patient had a PCN reaction that required hospitalization: Yes/ Has patient had a PCN reaction occurring within the last 10 years: No If all of the above answers are "NO", then may proceed with Cephalosporin use.    Ace Inhibitors     INDUCED BRONCHOSPASM   Contrast Media [Iodinated Contrast Media]     HOME MEDICATIONS: Outpatient Medications Prior to Visit  Medication Sig Dispense Refill   acetaminophen (TYLENOL) 500 MG tablet Take 500 mg by mouth every 6 (six) hours as  needed for headache.     albuterol (PROVENTIL HFA;VENTOLIN HFA) 108 (90 Base) MCG/ACT inhaler Inhale 2 puffs into the lungs every 6 (six) hours as needed. (Patient taking differently: Inhale 2 puffs into the lungs every 6 (six) hours as needed (Asthma).) 8.5 g 11   albuterol (PROVENTIL) (2.5 MG/3ML) 0.083% nebulizer solution Take 3 mLs (2.5 mg total) by nebulization every 6 (six) hours as needed. [id number]:U04.540 (Patient taking differently: Take 2.5 mg by nebulization every 6 (six) hours as needed (Asthma). [id number]:J47.829) 75 mL 1   ALPRAZolam (XANAX) 1 MG tablet TAKE ONE-HALF TABLET BY MOUTH ONCE DAILY AS NEEDED FOR STRESS (Patient taking differently: Take 0.5-1 mg by mouth daily as needed for anxiety.) 90 tablet 0   amLODipine (NORVASC) 10 MG tablet Take by mouth.     aspirin (ASPIRIN CHILDRENS) 81 MG chewable tablet Chew 1 tablet (81 mg total) by mouth daily.     atorvastatin (LIPITOR) 40 MG tablet Take 1 tablet (40 mg total) daily by mouth. 90 tablet 3   augmented betamethasone dipropionate (DIPROLENE-AF) 0.05 % cream Apply 1 application topically 2 (two) times daily as needed (eczema).     Biotin 56213 MCG TABS Take 10,000 mcg by mouth daily.     Cholecalciferol (VITAMIN D) 50 MCG (2000 UT) CAPS Take 0.5 capsules (1,000 Units total) by mouth daily. (Patient taking differently: Take 2,000 Units by mouth daily.)     Cyanocobalamin (B-12)  5000 MCG CAPS Take 5,000 mcg by mouth daily.     diltiazem (CARDIZEM CD) 240 MG 24 hr capsule TAKE 1 CAPSULE EVERY DAY 90 capsule 3   famotidine (PEPCID) 20 MG tablet Take 20 mg by mouth 2 (two) times daily.     FIASP 100 UNIT/ML SOLN Inject into the skin.     furosemide (LASIX) 20 MG tablet Take 1 tablet (20 mg total) by mouth daily as needed for fluid. 90 tablet 3   Glucose 15 g PACK Take 15-30 g by mouth daily as needed (low blood sugar).     L-Lysine 500 MG CAPS Take 500 mg by mouth daily.     levocetirizine (XYZAL ALLERGY 24HR) 5 MG tablet Take 1 tablet (5 mg  total) by mouth every evening. 30 tablet 0   levothyroxine (SYNTHROID) 125 MCG tablet Take 125 mcg by mouth daily before breakfast.     losartan-hydrochlorothiazide (HYZAAR) 100-25 MG tablet Take 1 tablet by mouth every morning. 90 tablet 3   Magnesium 500 MG TABS Take 500 mg by mouth at bedtime.     Melatonin 10 MG TABS Take 10 mg by mouth at bedtime.     olopatadine (PATANOL) 0.1 % ophthalmic solution 1 drop 2 (two) times daily.     omeprazole (PRILOSEC) 40 MG capsule Take 40 mg by mouth daily.     OVER THE COUNTER MEDICATION Take 4 tablets by mouth at bedtime. Focus factor otc supplement     Probiotic Product (PROBIOTIC PO) Take 1 capsule by mouth daily. 20 billion active cultures     rOPINIRole (REQUIP) 1 MG tablet Take 1 mg by mouth at bedtime.     sertraline (ZOLOFT) 100 MG tablet Take 100 mg by mouth daily.     simethicone (GAS-X) 80 MG chewable tablet Chew 1 tablet (80 mg total) by mouth 4 (four) times daily as needed for flatulence. 100 tablet 2   No facility-administered medications prior to visit.    PAST MEDICAL HISTORY: Past Medical History:  Diagnosis Date   Anxiety    Aortic regurgitation    Arthritis    Asthma    Depression    Depression    Diabetes mellitus    Dyspnea    Facet joint disease of lumbosacral region    Fibromyalgia    GERD (gastroesophageal reflux disease)    Goiter    Hair loss    Heart murmur    Hypertension    Menopause    Mitral regurgitation    Mitral valve prolapse    Other and unspecified hyperlipidemia    Positive PPD    Sleep apnea    Small bowel obstruction (HCC) 02/09/2018   SVT (supraventricular tachycardia) (HCC)    Vasculitis (HCC)     PAST SURGICAL HISTORY: Past Surgical History:  Procedure Laterality Date   ABDOMINAL HYSTERECTOMY     AORTIC VALVE REPLACEMENT N/A 06/05/2021   Procedure: AORTIC VALVE REPLACEMENT (AVR), USING INSPIRIS RESILIA  AORTIC VALVE;  Surgeon: Alleen Borne, MD;  Location: [facility] OR;  Service: Open  Heart Surgery;  Laterality: N/A;   APPENDECTOMY     CARDIAC CATHETERIZATION     CHOLECYSTECTOMY     DIAGNOSTIC LAPAROSCOPY     EYE SURGERY Bilateral    cataract removal   PARTIAL HYSTERECTOMY     RIGHT/LEFT HEART CATH AND CORONARY ANGIOGRAPHY N/A 04/28/2021   Procedure: RIGHT/LEFT HEART CATH AND CORONARY ANGIOGRAPHY;  Surgeon: Yates Decamp, MD;  Location: [facility] INVASIVE CV LAB;  Service:  Cardiovascular;  Laterality: N/A;   TEE WITHOUT CARDIOVERSION N/A 06/05/2021   Procedure: TRANSESOPHAGEAL ECHOCARDIOGRAM (TEE);  Surgeon: Alleen Borne, MD;  Location: [facility] OR;  Service: Open Heart Surgery;  Laterality: N/A;    FAMILY HISTORY: Family History  Problem Relation Age of Onset   Hepatitis Mother 55       died from Hep A   Diabetes Father    Sleep apnea Father    Cancer Brother    Crohn's disease Brother    Alcohol abuse Brother    Sleep apnea Brother     SOCIAL HISTORY: Social History   Socioeconomic History   Marital status: Married    Spouse name: Not on file   Number of children: 2   Years of education: Not on file   Highest education level: Not on file  Occupational History   Not on file  Tobacco Use   Smoking status: Former    Packs/day: 0.25    Years: 15.00    Additional pack years: 0.00    Total pack years: 3.75    Types: Cigarettes    Quit date: 03/13/1988    Years since quitting: 34.8   Smokeless tobacco: Never  Vaping Use   Vaping Use: Never used  Substance and Sexual Activity   Alcohol use: No   Drug use: No   Sexual activity: Not Currently  Other Topics Concern   Not on file  Social History Narrative   Caffeine 1-2 cups coffee daily.   Lives at home with husband    Retired.   Associates degree.   Has 4 kids (2 Human resources officer)   Social Determinants of Corporate investment banker Strain: Not on file  Food Insecurity: Not on file  Transportation Needs: Not on file  Physical Activity: Not on file  Stress: Not on file  Social Connections: Not on file   Intimate Partner Violence: Not on file      PHYSICAL EXAM  Vitals:   01/13/23 0802  BP: (!) 151/64  Pulse: 69  Weight: 177 lb 6.4 oz (80.5 kg)  Height: 5\' 3"  (1.6 m)    Body mass index is 31.42 kg/m.  Generalized: Well developed, in no acute distress  Chest: Lungs clear to auscultation bilaterally  Neurological examination  Mentation: Alert oriented to time, place, history taking. Follows all commands speech and language fluent Cranial nerve II-XII: Facial symmetry noted    DIAGNOSTIC DATA (LABS, IMAGING, TESTING) - I reviewed patient records, labs, notes, testing and imaging myself where available.  Lab Results  Component Value Date   WBC 17.3 (H) 06/08/2021   HGB 10.3 (L) 06/08/2021   HCT 32.2 (L) 06/08/2021   MCV 88.5 06/08/2021   PLT 148 (L) 06/08/2021      Component Value Date/Time   NA 135 06/13/2021 0435   K 4.4 06/13/2021 0435   CL 101 06/13/2021 0435   CO2 25 06/13/2021 0435   GLUCOSE 169 (H) 06/13/2021 0435   BUN 9 06/13/2021 0435   CREATININE 0.87 06/13/2021 0435   CREATININE 0.97 02/09/2018 1051   CALCIUM 9.3 06/13/2021 0435   PROT 7.2 06/03/2021 1236   ALBUMIN 3.8 06/03/2021 1236   AST 27 06/03/2021 1236   ALT 19 06/03/2021 1236   ALKPHOS 50 06/03/2021 1236   BILITOT 1.1 06/03/2021 1236   GFRNONAA >60 06/13/2021 0435   GFRNONAA 60 02/09/2018 1051   GFRAA >60 02/11/2018 0404   GFRAA 70 02/09/2018 1051   Lab Results  Component Value Date  CHOL 147 01/09/2018   HDL 40 (L) 01/09/2018   LDLCALC 74 01/09/2018   TRIG 252 (H) 01/09/2018   CHOLHDL 3.7 01/09/2018   Lab Results  Component Value Date   HGBA1C 7.1 (H) 06/03/2021   Lab Results  Component Value Date   VITAMINB12 626 10/21/2006   Lab Results  Component Value Date   TSH 1.82 01/09/2018      ASSESSMENT AND PLAN 72 y.o. year old female  has a past medical history of Anxiety, Aortic regurgitation, Arthritis, Asthma, Depression, Depression, Diabetes mellitus, Dyspnea,  Facet joint disease of lumbosacral region, Fibromyalgia, GERD (gastroesophageal reflux disease), Goiter, Hair loss, Heart murmur, Hypertension, Menopause, Mitral regurgitation, Mitral valve prolapse, Other and unspecified hyperlipidemia, Positive PPD, Sleep apnea, Small bowel obstruction (HCC) (02/09/2018), SVT (supraventricular tachycardia) (HCC), and Vasculitis (HCC). here with:  OSA on CPAP  - CPAP compliance excellent - Good treatment of AHI  - Encourage patient to use CPAP nightly and > 4 hours each night - F/U in 1 year or sooner if needed    Butch Penny, MSN, NP-C 01/13/2023, 7:58 AM [facility] [address], [address] [place], [place] [address] [phone]

## 2023-01-13 NOTE — Patient Instructions (Signed)
Continue using CPAP nightly and greater than 4 hours each night °If your symptoms worsen or you develop new symptoms please let us know.  ° °

## 2023-01-14 DIAGNOSIS — E039 Hypothyroidism, unspecified: Secondary | ICD-10-CM | POA: Diagnosis not present

## 2023-01-14 DIAGNOSIS — E1065 Type 1 diabetes mellitus with hyperglycemia: Secondary | ICD-10-CM | POA: Diagnosis not present

## 2023-01-14 DIAGNOSIS — E7800 Pure hypercholesterolemia, unspecified: Secondary | ICD-10-CM | POA: Diagnosis not present

## 2023-01-14 DIAGNOSIS — I10 Essential (primary) hypertension: Secondary | ICD-10-CM | POA: Diagnosis not present

## 2023-02-11 DIAGNOSIS — E1065 Type 1 diabetes mellitus with hyperglycemia: Secondary | ICD-10-CM | POA: Diagnosis not present

## 2023-02-11 DIAGNOSIS — E7800 Pure hypercholesterolemia, unspecified: Secondary | ICD-10-CM | POA: Diagnosis not present

## 2023-02-11 DIAGNOSIS — I10 Essential (primary) hypertension: Secondary | ICD-10-CM | POA: Diagnosis not present

## 2023-02-11 DIAGNOSIS — K219 Gastro-esophageal reflux disease without esophagitis: Secondary | ICD-10-CM | POA: Diagnosis not present

## 2023-02-18 DIAGNOSIS — F419 Anxiety disorder, unspecified: Secondary | ICD-10-CM | POA: Diagnosis not present

## 2023-02-18 DIAGNOSIS — G4733 Obstructive sleep apnea (adult) (pediatric): Secondary | ICD-10-CM | POA: Diagnosis not present

## 2023-02-18 DIAGNOSIS — R5383 Other fatigue: Secondary | ICD-10-CM | POA: Diagnosis not present

## 2023-02-18 DIAGNOSIS — E7800 Pure hypercholesterolemia, unspecified: Secondary | ICD-10-CM | POA: Diagnosis not present

## 2023-02-18 DIAGNOSIS — E049 Nontoxic goiter, unspecified: Secondary | ICD-10-CM | POA: Diagnosis not present

## 2023-02-18 DIAGNOSIS — E1065 Type 1 diabetes mellitus with hyperglycemia: Secondary | ICD-10-CM | POA: Diagnosis not present

## 2023-02-18 DIAGNOSIS — E039 Hypothyroidism, unspecified: Secondary | ICD-10-CM | POA: Diagnosis not present

## 2023-02-18 DIAGNOSIS — I10 Essential (primary) hypertension: Secondary | ICD-10-CM | POA: Diagnosis not present

## 2023-02-18 DIAGNOSIS — Z794 Long term (current) use of insulin: Secondary | ICD-10-CM | POA: Diagnosis not present

## 2023-02-18 DIAGNOSIS — E109 Type 1 diabetes mellitus without complications: Secondary | ICD-10-CM | POA: Diagnosis not present

## 2023-02-18 DIAGNOSIS — E559 Vitamin D deficiency, unspecified: Secondary | ICD-10-CM | POA: Diagnosis not present

## 2023-03-09 ENCOUNTER — Ambulatory Visit: Payer: Medicare Other | Admitting: Cardiology

## 2023-03-09 ENCOUNTER — Encounter: Payer: Self-pay | Admitting: Cardiology

## 2023-03-09 VITALS — BP 137/61 | HR 68 | Resp 16 | Ht 63.0 in | Wt 180.6 lb

## 2023-03-09 DIAGNOSIS — E109 Type 1 diabetes mellitus without complications: Secondary | ICD-10-CM | POA: Diagnosis not present

## 2023-03-09 DIAGNOSIS — Z954 Presence of other heart-valve replacement: Secondary | ICD-10-CM | POA: Diagnosis not present

## 2023-03-09 DIAGNOSIS — I10 Essential (primary) hypertension: Secondary | ICD-10-CM

## 2023-03-09 NOTE — Progress Notes (Signed)
Primary Physician/Referring:  Irena Reichmann, DO  Patient ID: [name], female    DOB: 1951-09-12, 72 y.o.   MRN: [id number]  Chief Complaint  Patient presents with   AV replacement   Hypertension   Shortness of Breath   Follow-up    1 year    HPI:    [name]  is a 72 y.o. Caucasian female with hypertension, Type I DM, mild obesity, hyperlipidemia, fibromyalgia, depression and PSVT, bicuspid aortic valve with aortic stenosis.  Patient underwent aortic valve replacement with a bioprosthetic tissue valve on 06/05/2021.  She is presently asymptomatic from cardiac standpoint.  Comes here for an annual visit.  No recent hospitalization.  Past Medical History:  Diagnosis Date   Anxiety    Aortic regurgitation    Arthritis    Asthma    Depression    Depression    Diabetes mellitus    Dyspnea    Facet joint disease of lumbosacral region    Fibromyalgia    GERD (gastroesophageal reflux disease)    Goiter    Hair loss    Heart murmur    Hypertension    Menopause    Mitral regurgitation    Mitral valve prolapse    Other and unspecified hyperlipidemia    Positive PPD    Sleep apnea    Small bowel obstruction (HCC) 02/09/2018   SVT (supraventricular tachycardia)    Vasculitis (HCC)     Family History  Problem Relation Age of Onset   Hepatitis Mother 66       died from Hep A   Diabetes Father    Sleep apnea Father    Cancer Brother    Crohn's disease Brother    Alcohol abuse Brother    Sleep apnea Brother     Social History   Tobacco Use   Smoking status: Former    Packs/day: 0.25    Years: 15.00    Additional pack years: 0.00    Total pack years: 3.75    Types: Cigarettes    Quit date: 03/13/1988    Years since quitting: 35.0   Smokeless tobacco: Never  Substance Use Topics   Alcohol use: No   Marital Status: Married  ROS  Review of Systems  Cardiovascular:  Negative for chest pain, dyspnea on exertion and leg swelling.   Objective   Blood pressure 137/61, pulse 68, resp. rate 16, height 5\' 3"  (1.6 m), weight 180 lb 9.6 oz (81.9 kg), SpO2 95 %.     03/09/2023    9:13 AM 03/09/2023    9:06 AM 01/13/2023    8:02 AM  Vitals with BMI  Height  5\' 3"  5\' 3"   Weight  180 lbs 10 oz 177 lbs 6 oz  BMI  32 31.43  Systolic 137 165 409  Diastolic 61 80 64  Pulse 68 74 69     Physical Exam Neck:     Vascular: No carotid bruit or JVD.  Cardiovascular:     Rate and Rhythm: Normal rate and regular rhythm.     Pulses: Intact distal pulses.     Heart sounds: Murmur heard.     Early systolic murmur is present with a grade of 1/6 at the upper right sternal border.     No gallop.  Pulmonary:     Effort: Pulmonary effort is normal.     Breath sounds: Normal breath sounds.  Abdominal:     General: Bowel sounds are normal.  Palpations: Abdomen is soft.     Hernia: A hernia is present. Hernia is present in the ventral area.  Musculoskeletal:     Right lower leg: No edema.     Left lower leg: No edema.    Laboratory examination:  External labs:   Cholesterol, total 175.000 m 01/07/2023 HDL 40.000 MG 01/07/2023 LDL 96.000 mg 01/07/2023 Triglycerides 232.000 m 01/07/2023  A1C 7.6% 11/26/2022 TSH 0.766 01/07/2023  Hemoglobin 10.300 g/d 06/08/2021  Creatinine, Serum 0.850 MG/ 01/07/2023  ALT (SGPT) 23.000 IU/ 01/07/2023  Labs 08/21/2022:  Hb 14.2/HCT 43.1, platelets 241.  TSH normal.  Vitamin D 45.7. A1C 7.2%   BUN 8, creatinine 0.78, EGFR 76,, potassium 3.7, LFTs normal.  Serum glucose 120 mg.  Total cholesterol 143, triglycerides 167, HDL 45, LDL 65.  Non-HDL cholesterol 98.  Medications and allergies   Allergies  Allergen Reactions   Penicillins Anaphylaxis    Has patient had a PCN reaction causing immediate rash, facial/tongue/throat swelling, SOB or lightheadedness with hypotension: No Has patient had a PCN reaction causing severe rash involving mucus membranes or skin necrosis: No Has patient had a PCN reaction  that required hospitalization: Yes/ Has patient had a PCN reaction occurring within the last 10 years: No If all of the above answers are "NO", then may proceed with Cephalosporin use.    Ace Inhibitors     INDUCED BRONCHOSPASM   Contrast Media [Iodinated Contrast Media]      Current Outpatient Medications:    acetaminophen (TYLENOL) 500 MG tablet, Take 500 mg by mouth every 6 (six) hours as needed for headache., Disp: , Rfl:    albuterol (PROVENTIL HFA;VENTOLIN HFA) 108 (90 Base) MCG/ACT inhaler, Inhale 2 puffs into the lungs every 6 (six) hours as needed. (Patient taking differently: Inhale 2 puffs into the lungs every 6 (six) hours as needed (Asthma).), Disp: 8.5 g, Rfl: 11   albuterol (PROVENTIL) (2.5 MG/3ML) 0.083% nebulizer solution, Take 3 mLs (2.5 mg total) by nebulization every 6 (six) hours as needed. [id number]:M01.027 (Patient taking differently: Take 2.5 mg by nebulization every 6 (six) hours as needed (Asthma). OZD66:Y40.347), Disp: 75 mL, Rfl: 1   ALPRAZolam (XANAX) 1 MG tablet, TAKE ONE-HALF TABLET BY MOUTH ONCE DAILY AS NEEDED FOR STRESS (Patient taking differently: Take 0.5-1 mg by mouth daily as needed for anxiety.), Disp: 90 tablet, Rfl: 0   amLODipine (NORVASC) 10 MG tablet, Take by mouth., Disp: , Rfl:    aspirin (ASPIRIN CHILDRENS) 81 MG chewable tablet, Chew 1 tablet (81 mg total) by mouth daily., Disp: , Rfl:    atorvastatin (LIPITOR) 40 MG tablet, Take 1 tablet (40 mg total) daily by mouth., Disp: 90 tablet, Rfl: 3   augmented betamethasone dipropionate (DIPROLENE-AF) 0.05 % cream, Apply 1 application topically 2 (two) times daily as needed (eczema)., Disp: , Rfl:    Biotin 42595 MCG TABS, Take 10,000 mcg by mouth daily., Disp: , Rfl:    Cholecalciferol (VITAMIN D) 50 MCG (2000 UT) CAPS, Take 0.5 capsules (1,000 Units total) by mouth daily. (Patient taking differently: Take 2,000 Units by mouth daily.), Disp: , Rfl:    Cyanocobalamin (B-12) 5000 MCG CAPS, Take 5,000 mcg by  mouth daily., Disp: , Rfl:    diltiazem (CARDIZEM CD) 240 MG 24 hr capsule, TAKE 1 CAPSULE EVERY DAY, Disp: 90 capsule, Rfl: 3   famotidine (PEPCID) 20 MG tablet, Take 20 mg by mouth 2 (two) times daily., Disp: , Rfl:    FIASP 100 UNIT/ML SOLN, Inject into the  skin., Disp: , Rfl:    furosemide (LASIX) 20 MG tablet, Take 1 tablet (20 mg total) by mouth daily as needed for fluid., Disp: 90 tablet, Rfl: 3   Glucose 15 g PACK, Take 15-30 g by mouth daily as needed (low blood sugar)., Disp: , Rfl:    L-Lysine 500 MG CAPS, Take 500 mg by mouth daily., Disp: , Rfl:    levocetirizine (XYZAL ALLERGY 24HR) 5 MG tablet, Take 1 tablet (5 mg total) by mouth every evening., Disp: 30 tablet, Rfl: 0   levothyroxine (SYNTHROID) 125 MCG tablet, Take 125 mcg by mouth daily before breakfast., Disp: , Rfl:    losartan-hydrochlorothiazide (HYZAAR) 100-25 MG tablet, Take 1 tablet by mouth every morning., Disp: 90 tablet, Rfl: 3   Magnesium 500 MG TABS, Take 500 mg by mouth at bedtime., Disp: , Rfl:    Melatonin 10 MG TABS, Take 10 mg by mouth at bedtime., Disp: , Rfl:    olopatadine (PATANOL) 0.1 % ophthalmic solution, 1 drop 2 (two) times daily., Disp: , Rfl:    Omega-3 Fatty Acids (OMEGA 3 PO), Take by mouth., Disp: , Rfl:    omeprazole (PRILOSEC) 40 MG capsule, Take 40 mg by mouth daily., Disp: , Rfl:    Probiotic Product (PROBIOTIC PO), Take 1 capsule by mouth daily. 20 billion active cultures, Disp: , Rfl:    rOPINIRole (REQUIP) 1 MG tablet, Take 1 mg by mouth at bedtime., Disp: , Rfl:    sertraline (ZOLOFT) 100 MG tablet, Take 100 mg by mouth daily., Disp: , Rfl:    TRESIBA FLEXTOUCH 100 UNIT/ML FlexTouch Pen, Inject 60 Units into the skin 2 (two) times daily., Disp: , Rfl:    Radiology:   No results found.  Cardiac Studies:   Exercise myoview stress 09/02/2014: 1. The resting electrocardiogram demonstrated normal sinus rhythm, normal resting conduction, no resting arrhythmias and nonspecific ST-T changes.  The stress electrocardiogram was normal. The patient performed treadmill exercise using a Bruce protocol, completing 6:45 minutes. The patient completed an estimated workload of 10.2 METS. The blood pressure response to exercise was normal. The stress test was terminated because of fatigue. 2. The overall quality of the study is good. There is no evidence of abnormal lung activity. Stress and rest SPECT images demonstrate homogeneous tracer distribution throughout the myocardium. Gated SPECT imaging reveals normal myocardial thickening and wall motion. The left ventricular ejection fraction was normal (44%) but visually appears to be normal. This is a low risk study. No significant change from Exercise sestamibi stress test on 05/08/2009.  Sleep study 10/17/2014 (Dohmeier, MD): Mild to moderate obstructive sleep apnea. Effectively treated with CPAP. Compliant  Right and left heart catheterization 04/28/2021:  Normal coronary arteries, left dominant circulation.  LV: 171/8, EDP 17.  Ao 104/46, mean 67 mmHg.  Peak to peak pressure gradient 50 and mean pressure gradient across the aortic valve of 44.6 mmHg.  Calculated aortic valve area 0.89 cm.  QP/QS 1.00. RA 16/13, mean 13; RV 35/1, EDP 9; PA 26/9, mean 14.  PA saturation 74%.  PW 23/24, mean 22 mmHg. CO 5.17, CI 2.83.  Normal.  PCV ECHOCARDIOGRAM COMPLETE 07/29/2021  Narrative Echocardiogram 07/29/2021: Normal LV systolic function with visual EF 55-60%. Left ventricle cavity is normal in size. Moderate left ventricular hypertrophy. Normal global wall motion. Indeterminate diastolic filling pattern, normal LAP. Abnormal septal wall motion due to post-operative valve. 27mm Bioprosthetic Inspiris Resilia aortic valve is well-seated, no evidence of dehiscence, trivial perivalvular regurgitation. No valvular stenosis or  regurgitation. Peak velocity 1.81m/s, Peak Pressure Gradient 7.9 mmHg, Mean Gradient 4.6 mmHg, AVA 2.0cm. Mild tricuspid regurgitation.  No evidence of pulmonary hypertension. Compared to study 03/25/2021 LVEF remains stable, AV is surgically replaced, diastolic function and LAP have improved. Otherwise, no significant change.   Carotid artery duplex 09/24/2020: No hemodynamically significant arterial disease in the internal carotid artery bilaterally. Antegrade right vertebral artery flow. Antegrade left vertebral artery flow. Compared to the study done on 03/25/2021, left ICA stenosis of 50 to 69% not noted.  However prior study on 05/10/2017 also had revealed very minimal disease bilaterally. Further studies if clinically indicated   EKG  EKG 03/09/2023: Normal sinus rhythm at rate of 69 bpm, left atrial enlargement, normal axis.  No evidence of ischemia otherwise normal EKG.  Compared to 03/08/2022, nonspecific inferior T wave inversion no longer present.  Assessment     ICD-10-CM   1. Primary hypertension  I10 EKG 12-Lead    2. H/O aortic valve replacement with tissue graft  Z95.4     3. Type 1 diabetes mellitus without complication (HCC)  E10.9       No orders of the defined types were placed in this encounter.  Medications Discontinued During This Encounter  Medication Reason   OVER THE COUNTER MEDICATION    simethicone (GAS-X) 80 MG chewable tablet    Recommendations:   [name]  is a 72 y.o. Caucasian female with hypertension, Type I DM, mild obesity, hyperlipidemia, fibromyalgia, depression and PSVT, bicuspid aortic valve with aortic stenosis.  Patient underwent aortic valve replacement with a bioprosthetic tissue valve on 06/05/2021.  1. Primary hypertension Patient blood pressure is well-controlled, she is currently doing well and remains asymptomatic.  No changes in the medications were done today. Presently on losartan HCT for hypertension and for diabetes mellitus, continue the same. - EKG 12-Lead  2. H/O aortic valve replacement with tissue graft Is functioning normally, echocardiogram done  last year reviewed.  No change in physical exam.  With regard to diabetes mellitus type I, I discussed with her again regarding calorie restriction reduction in her carbohydrate intake.  Increase exercise also discussed with the patient.  We could consider [place] or [place].  Weight loss and diabetes control.  Other orders - TRESIBA FLEXTOUCH 100 UNIT/ML FlexTouch Pen; Inject 60 Units into the skin 2 (two) times daily.      Ashley Decamp, MD, [facility] 03/09/2023, 9:38 AM Office: [phone] Fax: [phone] Pager: [phone]

## 2023-05-17 ENCOUNTER — Telehealth: Payer: Medicare Other | Admitting: Adult Health

## 2023-05-20 DIAGNOSIS — Z23 Encounter for immunization: Secondary | ICD-10-CM | POA: Diagnosis not present

## 2023-06-13 ENCOUNTER — Other Ambulatory Visit: Payer: Self-pay | Admitting: Cardiology

## 2023-06-16 ENCOUNTER — Telehealth: Payer: Self-pay | Admitting: Cardiology

## 2023-06-16 DIAGNOSIS — I10 Essential (primary) hypertension: Secondary | ICD-10-CM

## 2023-06-16 MED ORDER — LOSARTAN POTASSIUM-HCTZ 100-25 MG PO TABS: 1.0000 | ORAL_TABLET | ORAL | 3 refills | Status: DC

## 2023-06-16 NOTE — Telephone Encounter (Signed)
Rx(s) sent to pharmacy electronically.  Patient notified direct and voiced understanding.

## 2023-06-16 NOTE — Telephone Encounter (Signed)
*  STAT* If patient is at the pharmacy, call can be transferred to refill team.   1. Which medications need to be refilled? (please list name of each medication and dose if known) losartan-hydrochlorothiazide (HYZAAR) 100-25 MG tablet   2. Which pharmacy/location (including street and city if local pharmacy) is medication to be sent to? [place], [place]   3. Do they need a 30 day or 90 day supply? [place]

## 2023-06-20 ENCOUNTER — Other Ambulatory Visit: Payer: Self-pay

## 2023-06-20 DIAGNOSIS — I10 Essential (primary) hypertension: Secondary | ICD-10-CM

## 2023-06-20 MED ORDER — LOSARTAN POTASSIUM-HCTZ 100-25 MG PO TABS: 1.0000 | ORAL_TABLET | ORAL | 3 refills | Status: DC

## 2023-07-08 DIAGNOSIS — R5383 Other fatigue: Secondary | ICD-10-CM | POA: Diagnosis not present

## 2023-07-08 DIAGNOSIS — E7800 Pure hypercholesterolemia, unspecified: Secondary | ICD-10-CM | POA: Diagnosis not present

## 2023-07-08 DIAGNOSIS — E039 Hypothyroidism, unspecified: Secondary | ICD-10-CM | POA: Diagnosis not present

## 2023-07-08 DIAGNOSIS — E1065 Type 1 diabetes mellitus with hyperglycemia: Secondary | ICD-10-CM | POA: Diagnosis not present

## 2023-07-08 DIAGNOSIS — E109 Type 1 diabetes mellitus without complications: Secondary | ICD-10-CM | POA: Diagnosis not present

## 2023-07-15 DIAGNOSIS — M4690 Unspecified inflammatory spondylopathy, site unspecified: Secondary | ICD-10-CM | POA: Diagnosis not present

## 2023-07-15 DIAGNOSIS — E559 Vitamin D deficiency, unspecified: Secondary | ICD-10-CM | POA: Diagnosis not present

## 2023-07-15 DIAGNOSIS — Z794 Long term (current) use of insulin: Secondary | ICD-10-CM | POA: Diagnosis not present

## 2023-07-15 DIAGNOSIS — E049 Nontoxic goiter, unspecified: Secondary | ICD-10-CM | POA: Diagnosis not present

## 2023-07-15 DIAGNOSIS — E039 Hypothyroidism, unspecified: Secondary | ICD-10-CM | POA: Diagnosis not present

## 2023-07-15 DIAGNOSIS — E7800 Pure hypercholesterolemia, unspecified: Secondary | ICD-10-CM | POA: Diagnosis not present

## 2023-07-15 DIAGNOSIS — G2581 Restless legs syndrome: Secondary | ICD-10-CM | POA: Diagnosis not present

## 2023-07-15 DIAGNOSIS — I10 Essential (primary) hypertension: Secondary | ICD-10-CM | POA: Diagnosis not present

## 2023-07-15 DIAGNOSIS — E109 Type 1 diabetes mellitus without complications: Secondary | ICD-10-CM | POA: Diagnosis not present

## 2023-07-15 DIAGNOSIS — J45909 Unspecified asthma, uncomplicated: Secondary | ICD-10-CM | POA: Diagnosis not present

## 2023-07-15 DIAGNOSIS — E1065 Type 1 diabetes mellitus with hyperglycemia: Secondary | ICD-10-CM | POA: Diagnosis not present

## 2023-08-10 DIAGNOSIS — R5383 Other fatigue: Secondary | ICD-10-CM | POA: Diagnosis not present

## 2023-08-10 DIAGNOSIS — Z13220 Encounter for screening for lipoid disorders: Secondary | ICD-10-CM | POA: Diagnosis not present

## 2023-08-10 DIAGNOSIS — R7309 Other abnormal glucose: Secondary | ICD-10-CM | POA: Diagnosis not present

## 2023-08-10 DIAGNOSIS — R946 Abnormal results of thyroid function studies: Secondary | ICD-10-CM | POA: Diagnosis not present

## 2023-08-10 DIAGNOSIS — Z79899 Other long term (current) drug therapy: Secondary | ICD-10-CM | POA: Diagnosis not present

## 2023-08-17 DIAGNOSIS — F3340 Major depressive disorder, recurrent, in remission, unspecified: Secondary | ICD-10-CM | POA: Diagnosis not present

## 2023-08-17 DIAGNOSIS — J45909 Unspecified asthma, uncomplicated: Secondary | ICD-10-CM | POA: Diagnosis not present

## 2023-08-17 DIAGNOSIS — G4733 Obstructive sleep apnea (adult) (pediatric): Secondary | ICD-10-CM | POA: Diagnosis not present

## 2023-08-17 DIAGNOSIS — K219 Gastro-esophageal reflux disease without esophagitis: Secondary | ICD-10-CM | POA: Diagnosis not present

## 2023-08-17 DIAGNOSIS — Z0000 Encounter for general adult medical examination without abnormal findings: Secondary | ICD-10-CM | POA: Diagnosis not present

## 2023-08-17 DIAGNOSIS — Z954 Presence of other heart-valve replacement: Secondary | ICD-10-CM | POA: Diagnosis not present

## 2023-08-17 DIAGNOSIS — J040 Acute laryngitis: Secondary | ICD-10-CM | POA: Diagnosis not present

## 2023-08-17 DIAGNOSIS — M5431 Sciatica, right side: Secondary | ICD-10-CM | POA: Diagnosis not present

## 2023-08-17 DIAGNOSIS — F419 Anxiety disorder, unspecified: Secondary | ICD-10-CM | POA: Diagnosis not present

## 2023-08-24 ENCOUNTER — Other Ambulatory Visit: Payer: Self-pay | Admitting: Cardiology

## 2023-08-24 DIAGNOSIS — I5032 Chronic diastolic (congestive) heart failure: Secondary | ICD-10-CM

## 2023-08-25 DIAGNOSIS — I10 Essential (primary) hypertension: Secondary | ICD-10-CM | POA: Diagnosis not present

## 2023-08-25 DIAGNOSIS — R5383 Other fatigue: Secondary | ICD-10-CM | POA: Diagnosis not present

## 2023-08-25 DIAGNOSIS — E559 Vitamin D deficiency, unspecified: Secondary | ICD-10-CM | POA: Diagnosis not present

## 2023-08-25 DIAGNOSIS — E7800 Pure hypercholesterolemia, unspecified: Secondary | ICD-10-CM | POA: Diagnosis not present

## 2023-08-25 DIAGNOSIS — E109 Type 1 diabetes mellitus without complications: Secondary | ICD-10-CM | POA: Diagnosis not present

## 2023-08-25 DIAGNOSIS — E039 Hypothyroidism, unspecified: Secondary | ICD-10-CM | POA: Diagnosis not present

## 2023-08-25 DIAGNOSIS — R946 Abnormal results of thyroid function studies: Secondary | ICD-10-CM | POA: Diagnosis not present

## 2023-08-25 DIAGNOSIS — Z794 Long term (current) use of insulin: Secondary | ICD-10-CM | POA: Diagnosis not present

## 2023-08-25 DIAGNOSIS — R7309 Other abnormal glucose: Secondary | ICD-10-CM | POA: Diagnosis not present

## 2023-08-25 DIAGNOSIS — E049 Nontoxic goiter, unspecified: Secondary | ICD-10-CM | POA: Diagnosis not present

## 2023-08-25 DIAGNOSIS — E1065 Type 1 diabetes mellitus with hyperglycemia: Secondary | ICD-10-CM | POA: Diagnosis not present

## 2023-11-18 DIAGNOSIS — E119 Type 2 diabetes mellitus without complications: Secondary | ICD-10-CM | POA: Diagnosis not present

## 2023-11-19 DIAGNOSIS — E119 Type 2 diabetes mellitus without complications: Secondary | ICD-10-CM | POA: Diagnosis not present

## 2023-12-21 DIAGNOSIS — E119 Type 2 diabetes mellitus without complications: Secondary | ICD-10-CM | POA: Diagnosis not present

## 2023-12-26 DIAGNOSIS — R946 Abnormal results of thyroid function studies: Secondary | ICD-10-CM | POA: Diagnosis not present

## 2023-12-26 DIAGNOSIS — R5383 Other fatigue: Secondary | ICD-10-CM | POA: Diagnosis not present

## 2023-12-26 DIAGNOSIS — R7309 Other abnormal glucose: Secondary | ICD-10-CM | POA: Diagnosis not present

## 2023-12-26 DIAGNOSIS — Z79899 Other long term (current) drug therapy: Secondary | ICD-10-CM | POA: Diagnosis not present

## 2023-12-26 DIAGNOSIS — E039 Hypothyroidism, unspecified: Secondary | ICD-10-CM | POA: Diagnosis not present

## 2023-12-26 DIAGNOSIS — E7800 Pure hypercholesterolemia, unspecified: Secondary | ICD-10-CM | POA: Diagnosis not present

## 2023-12-26 DIAGNOSIS — Z13220 Encounter for screening for lipoid disorders: Secondary | ICD-10-CM | POA: Diagnosis not present

## 2024-01-02 DIAGNOSIS — E538 Deficiency of other specified B group vitamins: Secondary | ICD-10-CM | POA: Diagnosis not present

## 2024-01-02 DIAGNOSIS — E039 Hypothyroidism, unspecified: Secondary | ICD-10-CM | POA: Diagnosis not present

## 2024-01-02 DIAGNOSIS — Z794 Long term (current) use of insulin: Secondary | ICD-10-CM | POA: Diagnosis not present

## 2024-01-02 DIAGNOSIS — Z79899 Other long term (current) drug therapy: Secondary | ICD-10-CM | POA: Diagnosis not present

## 2024-01-02 DIAGNOSIS — I10 Essential (primary) hypertension: Secondary | ICD-10-CM | POA: Diagnosis not present

## 2024-01-02 DIAGNOSIS — E559 Vitamin D deficiency, unspecified: Secondary | ICD-10-CM | POA: Diagnosis not present

## 2024-01-02 DIAGNOSIS — E1065 Type 1 diabetes mellitus with hyperglycemia: Secondary | ICD-10-CM | POA: Diagnosis not present

## 2024-01-02 DIAGNOSIS — R7309 Other abnormal glucose: Secondary | ICD-10-CM | POA: Diagnosis not present

## 2024-01-02 DIAGNOSIS — R5383 Other fatigue: Secondary | ICD-10-CM | POA: Diagnosis not present

## 2024-01-02 DIAGNOSIS — E109 Type 1 diabetes mellitus without complications: Secondary | ICD-10-CM | POA: Diagnosis not present

## 2024-01-02 DIAGNOSIS — E7800 Pure hypercholesterolemia, unspecified: Secondary | ICD-10-CM | POA: Diagnosis not present

## 2024-01-21 DIAGNOSIS — E119 Type 2 diabetes mellitus without complications: Secondary | ICD-10-CM | POA: Diagnosis not present

## 2024-02-13 DIAGNOSIS — E103293 Type 1 diabetes mellitus with mild nonproliferative diabetic retinopathy without macular edema, bilateral: Secondary | ICD-10-CM | POA: Diagnosis not present

## 2024-02-13 DIAGNOSIS — H26493 Other secondary cataract, bilateral: Secondary | ICD-10-CM | POA: Diagnosis not present

## 2024-02-13 DIAGNOSIS — H35033 Hypertensive retinopathy, bilateral: Secondary | ICD-10-CM | POA: Diagnosis not present

## 2024-02-14 ENCOUNTER — Encounter: Payer: Self-pay | Admitting: Cardiology

## 2024-02-20 ENCOUNTER — Encounter: Payer: Self-pay | Admitting: *Deleted

## 2024-02-21 ENCOUNTER — Telehealth (INDEPENDENT_AMBULATORY_CARE_PROVIDER_SITE_OTHER): Payer: Medicare Other | Admitting: Adult Health

## 2024-02-21 DIAGNOSIS — G4733 Obstructive sleep apnea (adult) (pediatric): Secondary | ICD-10-CM

## 2024-02-21 NOTE — Progress Notes (Signed)
 PATIENT: [name] DOB: Nov 26, 1950  REASON FOR VISIT: follow up HISTORY FROM: patient PRIMARY NEUROLOGIST: Dr. Albertina Hugger   Virtual Visit via Video Note  I connected with [name] on 02/21/24 at  3:15 PM EDT by a video enabled telemedicine application located remotely at [facility] and verified that I am speaking with the correct person using two identifiers who was located at their own home in [place].   I discussed the limitations of evaluation and management by telemedicine and the availability of in person appointments. The patient expressed understanding and agreed to proceed.    HISTORY OF PRESENT ILLNESS: Today 02/21/24  [name] is a 73 y.o. female with a history of OSA on CPAP . Returns today for follow-up.  Reports that CPAP is working well for her.  Denies any new symptoms.  Download is below.      HISTORY   REVIEW OF SYSTEMS: Out of a complete 14 system review of symptoms, the patient complains only of the following symptoms, and all other reviewed systems are negative.  ALLERGIES: Allergies  Allergen Reactions   Penicillins Anaphylaxis    Has patient had a PCN reaction causing immediate rash, facial/tongue/throat swelling, SOB or lightheadedness with hypotension: No Has patient had a PCN reaction causing severe rash involving mucus membranes or skin necrosis: No Has patient had a PCN reaction that required hospitalization: Yes/ Has patient had a PCN reaction occurring within the last 10 years: No If all of the above answers are "NO", then may proceed with Cephalosporin use.    Ace Inhibitors     INDUCED BRONCHOSPASM   Contrast Media [Iodinated Contrast Media]     HOME MEDICATIONS: Outpatient Medications Prior to Visit  Medication Sig Dispense Refill   acetaminophen  (TYLENOL ) 500 MG tablet Take 500 mg by mouth every 6 (six) hours as needed for headache.     albuterol  (PROVENTIL  HFA;VENTOLIN  HFA) 108 (90 Base)  MCG/ACT inhaler Inhale 2 puffs into the lungs every 6 (six) hours as needed. (Patient taking differently: Inhale 2 puffs into the lungs every 6 (six) hours as needed (Asthma).) 8.5 g 11   albuterol  (PROVENTIL ) (2.5 MG/3ML) 0.083% nebulizer solution Take 3 mLs (2.5 mg total) by nebulization every 6 (six) hours as needed. ZOX09:U04.540 (Patient taking differently: Take 2.5 mg by nebulization every 6 (six) hours as needed (Asthma). ICD10:J45.909) 75 mL 1   ALPRAZolam  (XANAX ) 1 MG tablet TAKE ONE-HALF TABLET BY MOUTH ONCE DAILY AS NEEDED FOR STRESS (Patient taking differently: Take 0.5-1 mg by mouth daily as needed for anxiety.) 90 tablet 0   amLODipine  (NORVASC ) 10 MG tablet Take by mouth.     aspirin  (ASPIRIN  CHILDRENS) 81 MG chewable tablet Chew 1 tablet (81 mg total) by mouth daily.     atorvastatin  (LIPITOR) 40 MG tablet Take 1 tablet (40 mg total) daily by mouth. 90 tablet 3   augmented betamethasone  dipropionate (DIPROLENE -AF) 0.05 % cream Apply 1 application topically 2 (two) times daily as needed (eczema).     Biotin 98119 MCG TABS Take 10,000 mcg by mouth daily.     Cholecalciferol (VITAMIN D ) 50 MCG (2000 UT) CAPS Take 0.5 capsules (1,000 Units total) by mouth daily. (Patient taking differently: Take 2,000 Units by mouth daily.)     Cyanocobalamin (B-12) 5000 MCG CAPS Take 5,000 mcg by mouth daily.     diltiazem  (CARDIZEM  CD) 240 MG 24 hr capsule TAKE 1 CAPSULE EVERY DAY 90 capsule 3   famotidine  (PEPCID )  20 MG tablet Take 20 mg by mouth 2 (two) times daily.     FIASP  100 UNIT/ML SOLN Inject into the skin.     furosemide  (LASIX ) 20 MG tablet TAKE 1 TABLET EVERY DAY AS NEEDED FOR FLUID 90 tablet 1   Glucose 15 g PACK Take 15-30 g by mouth daily as needed (low blood sugar).     L-Lysine 500 MG CAPS Take 500 mg by mouth daily.     levocetirizine (XYZAL  ALLERGY 24HR) 5 MG tablet Take 1 tablet (5 mg total) by mouth every evening. 30 tablet 0   levothyroxine  (SYNTHROID ) 125 MCG tablet Take 125 mcg  by mouth daily before breakfast.     losartan -hydrochlorothiazide  (HYZAAR) 100-25 MG tablet Take 1 tablet by mouth every morning. 90 tablet 3   Magnesium  500 MG TABS Take 500 mg by mouth at bedtime.     Melatonin 10 MG TABS Take 10 mg by mouth at bedtime.     olopatadine (PATANOL) 0.1 % ophthalmic solution 1 drop 2 (two) times daily.     Omega-3 Fatty Acids (OMEGA 3 PO) Take by mouth.     omeprazole  (PRILOSEC) 40 MG capsule Take 40 mg by mouth daily.     Probiotic Product (PROBIOTIC PO) Take 1 capsule by mouth daily. 20 billion active cultures     rOPINIRole  (REQUIP ) 1 MG tablet Take 1 mg by mouth at bedtime.     sertraline  (ZOLOFT ) 100 MG tablet Take 100 mg by mouth daily.     TRESIBA FLEXTOUCH 100 UNIT/ML FlexTouch Pen Inject 60 Units into the skin 2 (two) times daily.     No facility-administered medications prior to visit.    PAST MEDICAL HISTORY: Past Medical History:  Diagnosis Date   Anxiety    Aortic regurgitation    Arthritis    Asthma    Depression    Depression    Diabetes mellitus    Dyspnea    Facet joint disease of lumbosacral region    Fibromyalgia    GERD (gastroesophageal reflux disease)    Goiter    Hair loss    Heart murmur    Hypertension    Menopause    Mitral regurgitation    Mitral valve prolapse    Other and unspecified hyperlipidemia    Positive PPD    Sleep apnea    Small bowel obstruction (HCC) 02/09/2018   SVT (supraventricular tachycardia)    Vasculitis (HCC)     PAST SURGICAL HISTORY: Past Surgical History:  Procedure Laterality Date   ABDOMINAL HYSTERECTOMY     AORTIC VALVE REPLACEMENT N/A 06/05/2021   Procedure: AORTIC VALVE REPLACEMENT (AVR), USING INSPIRIS RESILIA  AORTIC VALVE;  Surgeon: Bartley Lightning, MD;  Location: [facility] OR;  Service: Open Heart Surgery;  Laterality: N/A;   APPENDECTOMY     CARDIAC CATHETERIZATION     CHOLECYSTECTOMY     DIAGNOSTIC LAPAROSCOPY     EYE SURGERY Bilateral    cataract removal   PARTIAL  HYSTERECTOMY     RIGHT/LEFT HEART CATH AND CORONARY ANGIOGRAPHY N/A 04/28/2021   Procedure: RIGHT/LEFT HEART CATH AND CORONARY ANGIOGRAPHY;  Surgeon: Knox Perl, MD;  Location: [facility] INVASIVE CV LAB;  Service: Cardiovascular;  Laterality: N/A;   TEE WITHOUT CARDIOVERSION N/A 06/05/2021   Procedure: TRANSESOPHAGEAL ECHOCARDIOGRAM (TEE);  Surgeon: Bartley Lightning, MD;  Location: [facility] OR;  Service: Open Heart Surgery;  Laterality: N/A;    FAMILY HISTORY: Family History  Problem Relation Age of Onset   Hepatitis  Mother 80       died from Hep A   Diabetes Father    Sleep apnea Father    Cancer Brother    Crohn's disease Brother    Alcohol abuse Brother    Sleep apnea Brother     SOCIAL HISTORY: Social History   Socioeconomic History   Marital status: Married    Spouse name: Not on file   Number of children: 2   Years of education: Not on file   Highest education level: Not on file  Occupational History   Not on file  Tobacco Use   Smoking status: Former    Current packs/day: 0.00    Average packs/day: 0.3 packs/day for 15.0 years (3.8 ttl pk-yrs)    Types: Cigarettes    Start date: 03/13/1973    Quit date: 03/13/1988    Years since quitting: 35.9   Smokeless tobacco: Never  Vaping Use   Vaping status: Never Used  Substance and Sexual Activity   Alcohol use: No   Drug use: No   Sexual activity: Not Currently  Other Topics Concern   Not on file  Social History Narrative   Caffeine 1-2 cups coffee daily.   Lives at home with husband    Retired.   Associates degree.   Has 4 kids (2 Human resources officer)   Social Drivers of Corporate investment banker Strain: Not on file  Food Insecurity: Not on file  Transportation Needs: Not on file  Physical Activity: Not on file  Stress: Not on file  Social Connections: Not on file  Intimate Partner Violence: Not on file      PHYSICAL EXAM Generalized: Well developed, in no acute distress   Neurological examination  Mentation: Alert  oriented to time, place, history taking. Follows all commands speech and language fluent Cranial nerve II-XII: Facial symmetry noted.   DIAGNOSTIC DATA (LABS, IMAGING, TESTING) - I reviewed patient records, labs, notes, testing and imaging myself where available.  Lab Results  Component Value Date   WBC 17.3 (H) 06/08/2021   HGB 10.3 (L) 06/08/2021   HCT 32.2 (L) 06/08/2021   MCV 88.5 06/08/2021   PLT 148 (L) 06/08/2021      Component Value Date/Time   NA 135 06/13/2021 0435   K 4.4 06/13/2021 0435   CL 101 06/13/2021 0435   CO2 25 06/13/2021 0435   GLUCOSE 169 (H) 06/13/2021 0435   BUN 9 06/13/2021 0435   CREATININE 0.87 06/13/2021 0435   CREATININE 0.97 02/09/2018 1051   CALCIUM  9.3 06/13/2021 0435   PROT 7.2 06/03/2021 1236   ALBUMIN  3.8 06/03/2021 1236   AST 27 06/03/2021 1236   ALT 19 06/03/2021 1236   ALKPHOS 50 06/03/2021 1236   BILITOT 1.1 06/03/2021 1236   GFRNONAA >60 06/13/2021 0435   GFRNONAA 60 02/09/2018 1051   GFRAA >60 02/11/2018 0404   GFRAA 70 02/09/2018 1051   Lab Results  Component Value Date   CHOL 147 01/09/2018   HDL 40 (L) 01/09/2018   LDLCALC 74 01/09/2018   TRIG 252 (H) 01/09/2018   CHOLHDL 3.7 01/09/2018   Lab Results  Component Value Date   HGBA1C 7.1 (H) 06/03/2021   Lab Results  Component Value Date   VITAMINB12 626 10/21/2006   Lab Results  Component Value Date   TSH 1.82 01/09/2018      ASSESSMENT AND PLAN 73 y.o. year old female  has a past medical history of Anxiety, Aortic regurgitation, Arthritis, Asthma, Depression, Depression,  Diabetes mellitus, Dyspnea, Facet joint disease of lumbosacral region, Fibromyalgia, GERD (gastroesophageal reflux disease), Goiter, Hair loss, Heart murmur, Hypertension, Menopause, Mitral regurgitation, Mitral valve prolapse, Other and unspecified hyperlipidemia, Positive PPD, Sleep apnea, Small bowel obstruction (HCC) (02/09/2018), SVT (supraventricular tachycardia), and Vasculitis (HCC). here  with:  OSA on CPAP  CPAP compliance excellent Residual AHI is good Encouraged patient to continue using CPAP nightly and > 4 hours each night F/U in 1 year or sooner if needed   Clem Currier, MSN, NP-C 02/21/2024, 8:03 AM [facility] [address] [place], [place] [address] [phone]

## 2024-02-22 DIAGNOSIS — E119 Type 2 diabetes mellitus without complications: Secondary | ICD-10-CM | POA: Diagnosis not present

## 2024-03-07 DIAGNOSIS — E538 Deficiency of other specified B group vitamins: Secondary | ICD-10-CM | POA: Diagnosis not present

## 2024-03-07 DIAGNOSIS — E1065 Type 1 diabetes mellitus with hyperglycemia: Secondary | ICD-10-CM | POA: Diagnosis not present

## 2024-03-07 DIAGNOSIS — E039 Hypothyroidism, unspecified: Secondary | ICD-10-CM | POA: Diagnosis not present

## 2024-03-08 ENCOUNTER — Ambulatory Visit: Payer: Medicare Other | Admitting: Cardiology

## 2024-03-24 DIAGNOSIS — E119 Type 2 diabetes mellitus without complications: Secondary | ICD-10-CM | POA: Diagnosis not present

## 2024-03-26 ENCOUNTER — Encounter: Payer: Self-pay | Admitting: Cardiology

## 2024-03-26 ENCOUNTER — Ambulatory Visit: Attending: Cardiology | Admitting: Cardiology

## 2024-03-26 VITALS — BP 139/69 | HR 68 | Resp 16 | Ht 63.0 in | Wt 178.6 lb

## 2024-03-26 DIAGNOSIS — I10 Essential (primary) hypertension: Secondary | ICD-10-CM | POA: Insufficient documentation

## 2024-03-26 DIAGNOSIS — E1065 Type 1 diabetes mellitus with hyperglycemia: Secondary | ICD-10-CM | POA: Diagnosis not present

## 2024-03-26 DIAGNOSIS — E7800 Pure hypercholesterolemia, unspecified: Secondary | ICD-10-CM | POA: Diagnosis not present

## 2024-03-26 DIAGNOSIS — E109 Type 1 diabetes mellitus without complications: Secondary | ICD-10-CM | POA: Diagnosis not present

## 2024-03-26 DIAGNOSIS — E049 Nontoxic goiter, unspecified: Secondary | ICD-10-CM | POA: Diagnosis not present

## 2024-03-26 DIAGNOSIS — E559 Vitamin D deficiency, unspecified: Secondary | ICD-10-CM | POA: Diagnosis not present

## 2024-03-26 DIAGNOSIS — E039 Hypothyroidism, unspecified: Secondary | ICD-10-CM | POA: Diagnosis not present

## 2024-03-26 DIAGNOSIS — Z954 Presence of other heart-valve replacement: Secondary | ICD-10-CM | POA: Insufficient documentation

## 2024-03-26 DIAGNOSIS — Z794 Long term (current) use of insulin: Secondary | ICD-10-CM | POA: Diagnosis not present

## 2024-03-26 DIAGNOSIS — Z2989 Encounter for other specified prophylactic measures: Secondary | ICD-10-CM | POA: Insufficient documentation

## 2024-03-26 MED ORDER — EZETIMIBE 10 MG PO TABS: 10.0000 mg | ORAL_TABLET | Freq: Every day | ORAL | 3 refills | Status: AC

## 2024-03-26 NOTE — Progress Notes (Signed)
 Cardiology Office Note:  .   Date:  03/26/2024  ID:  [name], DOB 25-Jan-1951, MRN [id number] PCP: Gerome Brunet, DO  [facility] Providers Cardiologist:  Gordy Bergamo, MD   History of Present Illness: .   [name] is a 73 y.o. Caucasian female with hypertension, Type I DM, mild obesity, hyperlipidemia, fibromyalgia, depression and PSVT, bicuspid aortic valve with aortic stenosis.  Patient underwent aortic valve replacement with a bioprosthetic tissue valve on 06/05/2021.  She had normal coronary arteries at that time.  Her last echocardiogram on 07/29/2021 revealing normal LV systolic function, moderate LVH and 27 mm bioprosthetic Inspiris Resilia aortic valve was well-seated.  Discussed the use of AI scribe software for clinical note transcription with the patient, who gave verbal consent to proceed.  History of Present Illness [name] is a 73 year old female with aortic valve replacement and diabetes who presents for a cardiovascular follow-up.  She has no issues related to her aortic valve replacement, which was performed in September 2022. She is compliant with endocarditis prophylaxis, taking antibiotics before dental work.  She experiences shortness of breath, which she attributes to asthma. She continues to engage in physical activity, including walking. No chest pain is present.  Her current medications include losartan  HCT for blood pressure, atorvastatin  40 mg once daily for cholesterol, Jardiance 10 mg, Humalog, and Tresiba for diabetes management.  Her family history includes the recent loss of her daughter in a tragic accident, which has been a significant emotional event for her.  Labs    External Labs:  [facility]  Labs 12/26/2023:  Total cholesterol 173, triglycerides 178, HDL 47, LDL 95.  A1c 7.5%.  TSH normal at 2.630.  Hb 14.1, platelets 100 97K.  Serum creatinine 1.10, potassium 4.4, EGFR 53 mL.  ROS  Review of Systems   Cardiovascular:  Negative for chest pain, dyspnea on exertion and leg swelling.   Physical Exam:   VS:  BP 139/69 (BP Location: Left Arm, Patient Position: Sitting, Cuff Size: Normal)   Pulse 68   Resp 16   Ht 5' 3 (1.6 m)   Wt 178 lb 9.6 oz (81 kg)   SpO2 96%   BMI 31.64 kg/m    Wt Readings from Last 3 Encounters:  03/26/24 178 lb 9.6 oz (81 kg)  03/09/23 180 lb 9.6 oz (81.9 kg)  01/13/23 177 lb 6.4 oz (80.5 kg)    Physical Exam Neck:     Vascular: No JVD.  Cardiovascular:     Rate and Rhythm: Normal rate and regular rhythm.     Pulses: Intact distal pulses.     Heart sounds: S1 normal and S2 normal. Murmur heard.     Early systolic murmur is present with a grade of 1/6 at the upper right sternal border.     No gallop.  Pulmonary:     Effort: Pulmonary effort is normal.     Breath sounds: Normal breath sounds.  Abdominal:     General: Bowel sounds are normal.     Palpations: Abdomen is soft.  Musculoskeletal:     Right lower leg: No edema.     Left lower leg: No edema.    Studies Reviewed: SABRA     EKG:    EKG Interpretation Date/Time:  Monday March 26 2024 09:39:16 EDT Ventricular Rate:  65 PR Interval:  154 QRS Duration:  80 QT Interval:  430 QTC Calculation: 447 R Axis:   -15  Text Interpretation:  EKG 03/26/2024: Normal sinus rhythm at rate of 65 bpm, normal axis, LVH with repolarization abnormality, cannot exclude high lateral ischemia. Compared to 06/11/2021, repolarization abnormality was also noted in V4-V6.  Voltage in 1 and aVL is increased.  Overall no significant change. Confirmed by Zylen Wenig, Jagadeesh [phone]) on 03/26/2024 9:45:09 AM    EKG 03/09/2023: Normal sinus rhythm at rate of 69 bpm, left atrial enlargement, normal axis. No evidence of ischemia otherwise normal EKG.   Medications ordered    Meds ordered this encounter  Medications   ezetimibe  (ZETIA ) 10 MG tablet    Sig: Take 1 tablet (10 mg total) by mouth daily.    Dispense:  90 tablet     Refill:  3    Refills to Dr. Lonell Collet     ASSESSMENT AND PLAN: .      ICD-10-CM   1. Primary hypertension  I10 EKG 12-Lead    2. H/O aortic valve replacement with tissue graft INSPIRIS RESILIA  AORTIC VALVE 05/31/2021  Z95.4     3. Need for subacute bacterial endocarditis prophylaxis  Z29.89     4. Pure hypercholesterolemia  E78.00 ezetimibe  (ZETIA ) 10 MG tablet      Assessment and Plan Assessment & Plan Aortic valve replacement Aortic valve replacement performed in September 2022 shows good healing and integration, indicated by a softer murmur. Heart function and valve function are normal, confirmed by the latest ultrasound. No blocked arteries were found during heart catheterization at the time of valve replacement. - Discontinue aspirin  due to absence of heart blockages. - Continue endocarditis prophylaxis with antibiotics before dental procedures.  Hyperlipidemia LDL cholesterol was 95 mg/dL as of April 7974. Cholesterol levels are considered higher than ideal for stroke and heart attack prevention due to diabetes. - LDL goal < 70 - Add Zetia  to current medication regimen for cholesterol management.  Diabetes mellitus Diabetes is managed with Jardiance, Humalog, and Tresiba. No reports of urinary tract infections, a potential side effect of Jardiance.  Hypertension Hypertension is well controlled with losartan  HCT.  Follow-up Cardiac health is stable with no need for regular cardiology follow-up unless new symptoms arise. Transition of care to primary care provider, Dr. Lonell Collet, is planned. - Release from regular cardiology follow-up. - Return if primary care provider notes a significant change in heart murmur.   Signed,  Gordy Bergamo, MD, [facility] 03/26/2024, 9:37 PM [facility] [address] [place], [place] [address] Phone: [phone]. Fax:  [phone]

## 2024-03-26 NOTE — Patient Instructions (Signed)

## 2024-03-31 ENCOUNTER — Other Ambulatory Visit: Payer: Self-pay | Admitting: Cardiology

## 2024-03-31 DIAGNOSIS — I10 Essential (primary) hypertension: Secondary | ICD-10-CM

## 2024-04-25 DIAGNOSIS — E119 Type 2 diabetes mellitus without complications: Secondary | ICD-10-CM | POA: Diagnosis not present

## 2024-05-27 DIAGNOSIS — E119 Type 2 diabetes mellitus without complications: Secondary | ICD-10-CM | POA: Diagnosis not present

## 2024-07-06 DIAGNOSIS — E7800 Pure hypercholesterolemia, unspecified: Secondary | ICD-10-CM | POA: Diagnosis not present

## 2024-07-06 DIAGNOSIS — R946 Abnormal results of thyroid function studies: Secondary | ICD-10-CM | POA: Diagnosis not present

## 2024-07-06 DIAGNOSIS — R5383 Other fatigue: Secondary | ICD-10-CM | POA: Diagnosis not present

## 2024-07-06 DIAGNOSIS — R7309 Other abnormal glucose: Secondary | ICD-10-CM | POA: Diagnosis not present

## 2024-07-06 DIAGNOSIS — Z79899 Other long term (current) drug therapy: Secondary | ICD-10-CM | POA: Diagnosis not present

## 2024-07-06 DIAGNOSIS — E039 Hypothyroidism, unspecified: Secondary | ICD-10-CM | POA: Diagnosis not present

## 2024-07-06 DIAGNOSIS — Z13220 Encounter for screening for lipoid disorders: Secondary | ICD-10-CM | POA: Diagnosis not present

## 2024-07-13 DIAGNOSIS — E049 Nontoxic goiter, unspecified: Secondary | ICD-10-CM | POA: Diagnosis not present

## 2024-07-13 DIAGNOSIS — Z23 Encounter for immunization: Secondary | ICD-10-CM | POA: Diagnosis not present

## 2024-07-13 DIAGNOSIS — E559 Vitamin D deficiency, unspecified: Secondary | ICD-10-CM | POA: Diagnosis not present

## 2024-07-13 DIAGNOSIS — Z794 Long term (current) use of insulin: Secondary | ICD-10-CM | POA: Diagnosis not present

## 2024-07-13 DIAGNOSIS — E7800 Pure hypercholesterolemia, unspecified: Secondary | ICD-10-CM | POA: Diagnosis not present

## 2024-07-13 DIAGNOSIS — E1065 Type 1 diabetes mellitus with hyperglycemia: Secondary | ICD-10-CM | POA: Diagnosis not present

## 2024-07-13 DIAGNOSIS — R946 Abnormal results of thyroid function studies: Secondary | ICD-10-CM | POA: Diagnosis not present

## 2024-07-13 DIAGNOSIS — E039 Hypothyroidism, unspecified: Secondary | ICD-10-CM | POA: Diagnosis not present

## 2024-07-13 DIAGNOSIS — I10 Essential (primary) hypertension: Secondary | ICD-10-CM | POA: Diagnosis not present

## 2024-08-07 DIAGNOSIS — R946 Abnormal results of thyroid function studies: Secondary | ICD-10-CM | POA: Diagnosis not present

## 2024-08-07 DIAGNOSIS — R7309 Other abnormal glucose: Secondary | ICD-10-CM | POA: Diagnosis not present

## 2024-08-07 DIAGNOSIS — R5383 Other fatigue: Secondary | ICD-10-CM | POA: Diagnosis not present

## 2024-08-07 DIAGNOSIS — E039 Hypothyroidism, unspecified: Secondary | ICD-10-CM | POA: Diagnosis not present

## 2024-08-07 DIAGNOSIS — Z13220 Encounter for screening for lipoid disorders: Secondary | ICD-10-CM | POA: Diagnosis not present

## 2024-08-07 DIAGNOSIS — Z79899 Other long term (current) drug therapy: Secondary | ICD-10-CM | POA: Diagnosis not present

## 2024-08-17 DIAGNOSIS — F419 Anxiety disorder, unspecified: Secondary | ICD-10-CM | POA: Diagnosis not present

## 2024-08-17 DIAGNOSIS — M79641 Pain in right hand: Secondary | ICD-10-CM | POA: Diagnosis not present

## 2024-08-17 DIAGNOSIS — G2581 Restless legs syndrome: Secondary | ICD-10-CM | POA: Diagnosis not present

## 2024-08-17 DIAGNOSIS — Z79899 Other long term (current) drug therapy: Secondary | ICD-10-CM | POA: Diagnosis not present

## 2024-08-17 DIAGNOSIS — G4733 Obstructive sleep apnea (adult) (pediatric): Secondary | ICD-10-CM | POA: Diagnosis not present

## 2024-08-17 DIAGNOSIS — M47819 Spondylosis without myelopathy or radiculopathy, site unspecified: Secondary | ICD-10-CM | POA: Diagnosis not present

## 2024-08-17 DIAGNOSIS — L989 Disorder of the skin and subcutaneous tissue, unspecified: Secondary | ICD-10-CM | POA: Diagnosis not present

## 2024-08-17 DIAGNOSIS — R252 Cramp and spasm: Secondary | ICD-10-CM | POA: Diagnosis not present

## 2024-08-17 DIAGNOSIS — J45909 Unspecified asthma, uncomplicated: Secondary | ICD-10-CM | POA: Diagnosis not present

## 2024-08-17 DIAGNOSIS — M79642 Pain in left hand: Secondary | ICD-10-CM | POA: Diagnosis not present

## 2024-08-17 DIAGNOSIS — Z0000 Encounter for general adult medical examination without abnormal findings: Secondary | ICD-10-CM | POA: Diagnosis not present

## 2024-08-28 DIAGNOSIS — Z79899 Other long term (current) drug therapy: Secondary | ICD-10-CM | POA: Diagnosis not present

## 2025-02-19 ENCOUNTER — Telehealth: Admitting: Adult Health

## 2025-02-22 ENCOUNTER — Encounter: Admitting: Adult Health
